# Patient Record
Sex: Female | Born: 1950 | Race: White | Hispanic: No | Smoking: Never smoker
Health system: Southern US, Community
[De-identification: ages and names within clinical notes are randomized; demographics above are authoritative.]

## PROBLEM LIST (undated history)

## (undated) DIAGNOSIS — J869 Pyothorax without fistula: Secondary | ICD-10-CM

## (undated) DIAGNOSIS — J9311 Primary spontaneous pneumothorax: Secondary | ICD-10-CM

## (undated) DIAGNOSIS — I10 Essential (primary) hypertension: Secondary | ICD-10-CM

## (undated) DIAGNOSIS — J449 Chronic obstructive pulmonary disease, unspecified: Secondary | ICD-10-CM

## (undated) HISTORY — PX: APPENDECTOMY: SHX54

---

## 2005-10-10 ENCOUNTER — Ambulatory Visit: Payer: Self-pay | Admitting: Cardiology

## 2005-10-10 ENCOUNTER — Inpatient Hospital Stay (HOSPITAL_COMMUNITY): Admission: EM | Admit: 2005-10-10 | Discharge: 2005-10-15 | Payer: Self-pay | Admitting: *Deleted

## 2005-10-11 ENCOUNTER — Ambulatory Visit: Payer: Self-pay | Admitting: Pulmonary Disease

## 2014-10-12 DIAGNOSIS — I10 Essential (primary) hypertension: Secondary | ICD-10-CM | POA: Diagnosis not present

## 2014-10-12 DIAGNOSIS — Z23 Encounter for immunization: Secondary | ICD-10-CM | POA: Diagnosis not present

## 2014-10-12 DIAGNOSIS — J449 Chronic obstructive pulmonary disease, unspecified: Secondary | ICD-10-CM | POA: Diagnosis not present

## 2014-10-12 DIAGNOSIS — R600 Localized edema: Secondary | ICD-10-CM | POA: Diagnosis not present

## 2014-10-29 DIAGNOSIS — M7732 Calcaneal spur, left foot: Secondary | ICD-10-CM | POA: Diagnosis not present

## 2014-10-29 DIAGNOSIS — R600 Localized edema: Secondary | ICD-10-CM | POA: Diagnosis not present

## 2014-10-29 DIAGNOSIS — M7989 Other specified soft tissue disorders: Secondary | ICD-10-CM | POA: Diagnosis not present

## 2014-10-29 DIAGNOSIS — M25572 Pain in left ankle and joints of left foot: Secondary | ICD-10-CM | POA: Diagnosis not present

## 2014-11-10 DIAGNOSIS — R600 Localized edema: Secondary | ICD-10-CM | POA: Diagnosis not present

## 2014-11-10 DIAGNOSIS — I872 Venous insufficiency (chronic) (peripheral): Secondary | ICD-10-CM | POA: Diagnosis not present

## 2014-12-11 DIAGNOSIS — I83892 Varicose veins of left lower extremities with other complications: Secondary | ICD-10-CM | POA: Diagnosis not present

## 2014-12-11 DIAGNOSIS — I8392 Asymptomatic varicose veins of left lower extremity: Secondary | ICD-10-CM | POA: Diagnosis not present

## 2014-12-11 DIAGNOSIS — I83812 Varicose veins of left lower extremities with pain: Secondary | ICD-10-CM | POA: Diagnosis not present

## 2014-12-11 DIAGNOSIS — J449 Chronic obstructive pulmonary disease, unspecified: Secondary | ICD-10-CM | POA: Diagnosis not present

## 2014-12-18 DIAGNOSIS — I83812 Varicose veins of left lower extremities with pain: Secondary | ICD-10-CM | POA: Diagnosis not present

## 2014-12-18 DIAGNOSIS — Z9889 Other specified postprocedural states: Secondary | ICD-10-CM | POA: Diagnosis not present

## 2014-12-21 DIAGNOSIS — E785 Hyperlipidemia, unspecified: Secondary | ICD-10-CM | POA: Diagnosis not present

## 2014-12-21 DIAGNOSIS — Z9181 History of falling: Secondary | ICD-10-CM | POA: Diagnosis not present

## 2014-12-21 DIAGNOSIS — Z0000 Encounter for general adult medical examination without abnormal findings: Secondary | ICD-10-CM | POA: Diagnosis not present

## 2014-12-21 DIAGNOSIS — Z1212 Encounter for screening for malignant neoplasm of rectum: Secondary | ICD-10-CM | POA: Diagnosis not present

## 2014-12-21 DIAGNOSIS — Z1389 Encounter for screening for other disorder: Secondary | ICD-10-CM | POA: Diagnosis not present

## 2015-01-14 DIAGNOSIS — G5600 Carpal tunnel syndrome, unspecified upper limb: Secondary | ICD-10-CM | POA: Diagnosis not present

## 2015-01-26 DIAGNOSIS — I83812 Varicose veins of left lower extremities with pain: Secondary | ICD-10-CM | POA: Diagnosis not present

## 2015-01-26 DIAGNOSIS — I83892 Varicose veins of left lower extremities with other complications: Secondary | ICD-10-CM | POA: Diagnosis not present

## 2015-02-18 DIAGNOSIS — R0602 Shortness of breath: Secondary | ICD-10-CM | POA: Diagnosis not present

## 2015-02-19 DIAGNOSIS — J441 Chronic obstructive pulmonary disease with (acute) exacerbation: Secondary | ICD-10-CM | POA: Diagnosis not present

## 2015-02-19 DIAGNOSIS — R05 Cough: Secondary | ICD-10-CM | POA: Diagnosis not present

## 2015-02-19 DIAGNOSIS — R7989 Other specified abnormal findings of blood chemistry: Secondary | ICD-10-CM | POA: Diagnosis not present

## 2015-02-19 DIAGNOSIS — R0602 Shortness of breath: Secondary | ICD-10-CM | POA: Diagnosis not present

## 2015-02-19 DIAGNOSIS — J209 Acute bronchitis, unspecified: Secondary | ICD-10-CM | POA: Diagnosis not present

## 2015-02-19 DIAGNOSIS — R918 Other nonspecific abnormal finding of lung field: Secondary | ICD-10-CM | POA: Diagnosis not present

## 2015-02-19 DIAGNOSIS — Z79899 Other long term (current) drug therapy: Secondary | ICD-10-CM | POA: Diagnosis not present

## 2015-02-19 DIAGNOSIS — J9601 Acute respiratory failure with hypoxia: Secondary | ICD-10-CM | POA: Diagnosis not present

## 2015-02-19 DIAGNOSIS — N179 Acute kidney failure, unspecified: Secondary | ICD-10-CM | POA: Diagnosis not present

## 2015-02-19 DIAGNOSIS — R0902 Hypoxemia: Secondary | ICD-10-CM | POA: Diagnosis not present

## 2015-02-19 DIAGNOSIS — J440 Chronic obstructive pulmonary disease with acute lower respiratory infection: Secondary | ICD-10-CM | POA: Diagnosis not present

## 2015-02-19 DIAGNOSIS — R911 Solitary pulmonary nodule: Secondary | ICD-10-CM | POA: Diagnosis not present

## 2015-02-19 DIAGNOSIS — J159 Unspecified bacterial pneumonia: Secondary | ICD-10-CM | POA: Diagnosis not present

## 2015-02-19 DIAGNOSIS — E860 Dehydration: Secondary | ICD-10-CM | POA: Diagnosis not present

## 2015-02-19 DIAGNOSIS — R0600 Dyspnea, unspecified: Secondary | ICD-10-CM | POA: Diagnosis not present

## 2015-02-19 DIAGNOSIS — I340 Nonrheumatic mitral (valve) insufficiency: Secondary | ICD-10-CM | POA: Diagnosis not present

## 2015-02-19 DIAGNOSIS — I10 Essential (primary) hypertension: Secondary | ICD-10-CM | POA: Diagnosis not present

## 2015-03-03 DIAGNOSIS — J159 Unspecified bacterial pneumonia: Secondary | ICD-10-CM | POA: Diagnosis not present

## 2015-03-03 DIAGNOSIS — J9601 Acute respiratory failure with hypoxia: Secondary | ICD-10-CM | POA: Diagnosis not present

## 2015-03-03 DIAGNOSIS — J441 Chronic obstructive pulmonary disease with (acute) exacerbation: Secondary | ICD-10-CM | POA: Diagnosis not present

## 2015-03-03 DIAGNOSIS — R911 Solitary pulmonary nodule: Secondary | ICD-10-CM | POA: Diagnosis not present

## 2015-03-03 DIAGNOSIS — Z09 Encounter for follow-up examination after completed treatment for conditions other than malignant neoplasm: Secondary | ICD-10-CM | POA: Diagnosis not present

## 2015-08-26 DIAGNOSIS — J9690 Respiratory failure, unspecified, unspecified whether with hypoxia or hypercapnia: Secondary | ICD-10-CM | POA: Diagnosis not present

## 2015-08-26 DIAGNOSIS — K219 Gastro-esophageal reflux disease without esophagitis: Secondary | ICD-10-CM | POA: Diagnosis not present

## 2015-08-26 DIAGNOSIS — J440 Chronic obstructive pulmonary disease with acute lower respiratory infection: Secondary | ICD-10-CM | POA: Diagnosis not present

## 2015-08-26 DIAGNOSIS — J449 Chronic obstructive pulmonary disease, unspecified: Secondary | ICD-10-CM | POA: Diagnosis not present

## 2015-08-26 DIAGNOSIS — J180 Bronchopneumonia, unspecified organism: Secondary | ICD-10-CM | POA: Diagnosis not present

## 2015-08-26 DIAGNOSIS — E860 Dehydration: Secondary | ICD-10-CM | POA: Diagnosis not present

## 2015-08-26 DIAGNOSIS — J209 Acute bronchitis, unspecified: Secondary | ICD-10-CM | POA: Diagnosis not present

## 2015-08-26 DIAGNOSIS — J219 Acute bronchiolitis, unspecified: Secondary | ICD-10-CM | POA: Diagnosis not present

## 2015-08-26 DIAGNOSIS — R0902 Hypoxemia: Secondary | ICD-10-CM | POA: Diagnosis not present

## 2015-08-26 DIAGNOSIS — Z79899 Other long term (current) drug therapy: Secondary | ICD-10-CM | POA: Diagnosis not present

## 2015-08-26 DIAGNOSIS — I10 Essential (primary) hypertension: Secondary | ICD-10-CM | POA: Diagnosis not present

## 2015-08-26 DIAGNOSIS — J441 Chronic obstructive pulmonary disease with (acute) exacerbation: Secondary | ICD-10-CM | POA: Diagnosis not present

## 2015-08-26 DIAGNOSIS — Z7982 Long term (current) use of aspirin: Secondary | ICD-10-CM | POA: Diagnosis not present

## 2015-08-26 DIAGNOSIS — Z23 Encounter for immunization: Secondary | ICD-10-CM | POA: Diagnosis not present

## 2015-08-26 DIAGNOSIS — Z7952 Long term (current) use of systemic steroids: Secondary | ICD-10-CM | POA: Diagnosis not present

## 2015-08-26 DIAGNOSIS — E785 Hyperlipidemia, unspecified: Secondary | ICD-10-CM | POA: Diagnosis not present

## 2015-08-26 DIAGNOSIS — R0602 Shortness of breath: Secondary | ICD-10-CM | POA: Diagnosis not present

## 2015-08-26 DIAGNOSIS — R069 Unspecified abnormalities of breathing: Secondary | ICD-10-CM | POA: Diagnosis not present

## 2015-08-26 DIAGNOSIS — J9601 Acute respiratory failure with hypoxia: Secondary | ICD-10-CM | POA: Diagnosis not present

## 2015-08-31 DIAGNOSIS — J219 Acute bronchiolitis, unspecified: Secondary | ICD-10-CM | POA: Diagnosis not present

## 2015-08-31 DIAGNOSIS — J440 Chronic obstructive pulmonary disease with acute lower respiratory infection: Secondary | ICD-10-CM | POA: Diagnosis not present

## 2015-08-31 DIAGNOSIS — I10 Essential (primary) hypertension: Secondary | ICD-10-CM | POA: Diagnosis not present

## 2015-09-21 DIAGNOSIS — J449 Chronic obstructive pulmonary disease, unspecified: Secondary | ICD-10-CM | POA: Diagnosis not present

## 2015-09-21 DIAGNOSIS — Z09 Encounter for follow-up examination after completed treatment for conditions other than malignant neoplasm: Secondary | ICD-10-CM | POA: Diagnosis not present

## 2015-09-21 DIAGNOSIS — I10 Essential (primary) hypertension: Secondary | ICD-10-CM | POA: Diagnosis not present

## 2015-10-01 DIAGNOSIS — E785 Hyperlipidemia, unspecified: Secondary | ICD-10-CM | POA: Diagnosis not present

## 2015-10-01 DIAGNOSIS — K219 Gastro-esophageal reflux disease without esophagitis: Secondary | ICD-10-CM | POA: Diagnosis not present

## 2015-10-01 DIAGNOSIS — J180 Bronchopneumonia, unspecified organism: Secondary | ICD-10-CM | POA: Diagnosis not present

## 2015-10-01 DIAGNOSIS — J449 Chronic obstructive pulmonary disease, unspecified: Secondary | ICD-10-CM | POA: Diagnosis not present

## 2015-10-27 ENCOUNTER — Institutional Professional Consult (permissible substitution): Payer: Self-pay | Admitting: Pulmonary Disease

## 2015-10-29 DIAGNOSIS — J180 Bronchopneumonia, unspecified organism: Secondary | ICD-10-CM | POA: Diagnosis not present

## 2015-10-29 DIAGNOSIS — J449 Chronic obstructive pulmonary disease, unspecified: Secondary | ICD-10-CM | POA: Diagnosis not present

## 2015-10-29 DIAGNOSIS — E785 Hyperlipidemia, unspecified: Secondary | ICD-10-CM | POA: Diagnosis not present

## 2015-10-29 DIAGNOSIS — K219 Gastro-esophageal reflux disease without esophagitis: Secondary | ICD-10-CM | POA: Diagnosis not present

## 2015-11-15 ENCOUNTER — Other Ambulatory Visit: Payer: Self-pay

## 2015-11-23 ENCOUNTER — Other Ambulatory Visit: Payer: Self-pay

## 2015-11-23 ENCOUNTER — Encounter: Payer: Self-pay | Admitting: Pulmonary Disease

## 2015-11-23 ENCOUNTER — Ambulatory Visit (INDEPENDENT_AMBULATORY_CARE_PROVIDER_SITE_OTHER): Payer: Medicare Other | Admitting: Pulmonary Disease

## 2015-11-23 ENCOUNTER — Ambulatory Visit (INDEPENDENT_AMBULATORY_CARE_PROVIDER_SITE_OTHER)
Admission: RE | Admit: 2015-11-23 | Discharge: 2015-11-23 | Disposition: A | Discharge status: Home / Self Care | Payer: Medicare Other | Source: Ambulatory Visit | Attending: Pulmonary Disease | Admitting: Pulmonary Disease

## 2015-11-23 VITALS — BP 160/80 | HR 63 | Ht 64.0 in | Wt 153.0 lb

## 2015-11-23 DIAGNOSIS — J449 Chronic obstructive pulmonary disease, unspecified: Secondary | ICD-10-CM

## 2015-11-23 DIAGNOSIS — R0602 Shortness of breath: Secondary | ICD-10-CM | POA: Diagnosis not present

## 2015-11-23 NOTE — Progress Notes (Signed)
   Subjective:    Patient ID: Diana Gardner, female    DOB: 1951/08/10, 65 y.o.   MRN: [id number]  HPI Consult for evaluation of recurrent bronchitis, COPD  Diana Gardner is a 65 year old with past medical history of COPD, bronchitis. She she has been told she has COPD but she's never had lung function tests or seen a pulmonologist in the past. She has history of recurrent bronchitis, pneumonias. She is hospitalized at least once every year with this. She was at [facility] in January 2017 with acute exacerbation of COPD, bronchitis. She does not recall if she was tested for the flu at that time.  In office today she feels well. She has mild dyspnea on exertion associated with wheeze. She has chronic daily cough with no sputum production. No fevers, chills. She has been on Combivent for the past 5 months. She states that this helps with her symptoms.  Social History:  She is a lifelong nonsmoker. She had been exposed to secondhand smoke at home. No alcohol, drug use.  Family History: Mother-emphysema Father-heart disease.  PMH Hypertension  ,\ Current outpatient prescriptions:  .  ALPRAZolam (XANAX) 0.25 MG tablet, Take 1-2 tablets by mouth daily as needed., Disp: , Rfl:  .  atorvastatin (LIPITOR) 20 MG tablet, Take 20 mg by mouth daily., Disp: , Rfl:  .  COMBIVENT RESPIMAT 20-100 MCG/ACT AERS respimat, Inhale 1 puff into the lungs 4 (four) times daily., Disp: , Rfl:  .  ipratropium-albuterol (DUONEB) 0.5-2.5 (3) MG/3ML SOLN, Take 3 mLs by nebulization., Disp: , Rfl:  .  lisinopril-hydrochlorothiazide (PRINZIDE,ZESTORETIC) 20-25 MG tablet, Take 1 tablet by mouth daily., Disp: , Rfl:  .  metoprolol (LOPRESSOR) 100 MG tablet, Take 1 tablet by mouth 2 (two) times daily., Disp: , Rfl:   Review of Systems Dyspnea on exertion with wheezing, nonproductive cough. No sputum production, fevers, chills, hemoptysis. No chest pain, palpitation. No nausea, vomiting, diarrhea,  constipation. All other review of systems are negative.    Objective:   Physical Exam Blood pressure 160/80, pulse 63, height 5\' 4"  (1.626 m), weight 153 lb (69.4 kg), SpO2 95 %. Gen: No apparent distress Neuro: No gross focal deficits. HEENT: No JVD, lymphadenopathy, thyromegaly. RS: Clear, No wheeze or crackles CVS: S1-S2 heard, no murmurs rubs gallops. Abdomen: Soft, positive bowel sounds. Musculoskeletal: No edema.    Assessment & Plan:  Evaluation for COPD, recurrent bronchitis.  Review of her CT scan and lung imaging shows hyperinflation consistent with emphysema although she is a never smoker. There is family history of emphysema on her mother's side who was a smoker. Her CT imaging over the years at [place] was reviewed. They show bronchitis, bronchiolitis but no clear evidence of bronchiectasis.   She is doing well on the Combivent and will continue the same. I will evaluate again in PFTs, x-ray today and blood tests for alpha-1 antitrypsin, quantitative immunoglobulins, cf panel.  Plan: - Continue using the conbivent, duonebs - Get PFTs, blood work for A1AT, quant immunoglobulins, CF panel. - CXR  Return in 1 month.  Marshell Garfinkel MD [facility] Pager [phone] If no answer or after 3pm call: [phone] 11/23/2015, 12:36 PM

## 2015-11-23 NOTE — Patient Instructions (Addendum)
We will schedule you for lung function tests Blood tests to be done today.  Continue using the combivent inhalers. Return to clinic in 1 month.

## 2015-11-24 LAB — IGG, IGA, IGM
IGA: 278 mg/dL (ref 69–380)
IGM, SERUM: 165 mg/dL (ref 52–322)
IgG (Immunoglobin G), Serum: 1660 mg/dL (ref 690–1700)

## 2015-11-25 LAB — IGE: IGE (IMMUNOGLOBULIN E), SERUM: 145 kU/L — AB (ref <115)

## 2015-11-26 NOTE — Progress Notes (Signed)
Quick Note:  ATC pt x2 at number on file, operator stated number cannot be completed as dialed. Will try again later. ______

## 2015-11-29 DIAGNOSIS — J180 Bronchopneumonia, unspecified organism: Secondary | ICD-10-CM | POA: Diagnosis not present

## 2015-11-29 DIAGNOSIS — K219 Gastro-esophageal reflux disease without esophagitis: Secondary | ICD-10-CM | POA: Diagnosis not present

## 2015-11-29 DIAGNOSIS — J449 Chronic obstructive pulmonary disease, unspecified: Secondary | ICD-10-CM | POA: Diagnosis not present

## 2015-11-29 DIAGNOSIS — E785 Hyperlipidemia, unspecified: Secondary | ICD-10-CM | POA: Diagnosis not present

## 2015-11-29 LAB — ALPHA-1 ANTITRYPSIN PHENOTYPE: A-1 Antitrypsin: 150 mg/dL (ref 83–199)

## 2015-11-30 LAB — CYSTIC FIBROSIS DIAGNOSTIC STUDY

## 2015-12-01 NOTE — Progress Notes (Signed)
Quick Note:  ATC pt x2, the number we have on file for her is not a working number [employer] on file for pt's daughter and son, have LMOM TCB x1 for pt's daughter Diana Gardner. ______

## 2015-12-01 NOTE — Progress Notes (Signed)
Quick Note:  Pt's daughter Magda Paganini returned call. Advised of cxr results as stated by PM. Magda Paganini voiced her understanding and pt's contact number corrected in chart. ______

## 2015-12-07 DIAGNOSIS — F4322 Adjustment disorder with anxiety: Secondary | ICD-10-CM | POA: Diagnosis not present

## 2015-12-07 DIAGNOSIS — J449 Chronic obstructive pulmonary disease, unspecified: Secondary | ICD-10-CM | POA: Diagnosis not present

## 2015-12-07 DIAGNOSIS — R001 Bradycardia, unspecified: Secondary | ICD-10-CM | POA: Diagnosis not present

## 2015-12-07 DIAGNOSIS — Z6826 Body mass index (BMI) 26.0-26.9, adult: Secondary | ICD-10-CM | POA: Diagnosis not present

## 2015-12-29 DIAGNOSIS — E785 Hyperlipidemia, unspecified: Secondary | ICD-10-CM | POA: Diagnosis not present

## 2015-12-29 DIAGNOSIS — J449 Chronic obstructive pulmonary disease, unspecified: Secondary | ICD-10-CM | POA: Diagnosis not present

## 2015-12-29 DIAGNOSIS — J180 Bronchopneumonia, unspecified organism: Secondary | ICD-10-CM | POA: Diagnosis not present

## 2015-12-29 DIAGNOSIS — K219 Gastro-esophageal reflux disease without esophagitis: Secondary | ICD-10-CM | POA: Diagnosis not present

## 2016-01-29 DIAGNOSIS — J449 Chronic obstructive pulmonary disease, unspecified: Secondary | ICD-10-CM | POA: Diagnosis not present

## 2016-01-29 DIAGNOSIS — J180 Bronchopneumonia, unspecified organism: Secondary | ICD-10-CM | POA: Diagnosis not present

## 2016-01-29 DIAGNOSIS — E785 Hyperlipidemia, unspecified: Secondary | ICD-10-CM | POA: Diagnosis not present

## 2016-01-29 DIAGNOSIS — K219 Gastro-esophageal reflux disease without esophagitis: Secondary | ICD-10-CM | POA: Diagnosis not present

## 2016-01-31 ENCOUNTER — Ambulatory Visit (INDEPENDENT_AMBULATORY_CARE_PROVIDER_SITE_OTHER): Payer: Medicare Other | Admitting: Pulmonary Disease

## 2016-01-31 ENCOUNTER — Encounter: Payer: Self-pay | Admitting: Pulmonary Disease

## 2016-01-31 ENCOUNTER — Encounter (INDEPENDENT_AMBULATORY_CARE_PROVIDER_SITE_OTHER): Payer: Self-pay

## 2016-01-31 VITALS — BP 124/72 | HR 55 | Ht 62.0 in | Wt 151.0 lb

## 2016-01-31 DIAGNOSIS — J441 Chronic obstructive pulmonary disease with (acute) exacerbation: Secondary | ICD-10-CM | POA: Insufficient documentation

## 2016-01-31 DIAGNOSIS — J449 Chronic obstructive pulmonary disease, unspecified: Secondary | ICD-10-CM

## 2016-01-31 LAB — PULMONARY FUNCTION TEST
DL/VA % PRED: 152 %
DL/VA: 6.9 ml/min/mmHg/L
DLCO COR % PRED: 75 %
DLCO COR: 16.2 ml/min/mmHg
DLCO unc % pred: 68 %
DLCO unc: 14.74 ml/min/mmHg
FEF 25-75 POST: 0.56 L/s
FEF 25-75 Pre: 0.29 L/sec
FEF2575-%CHANGE-POST: 95 %
FEF2575-%PRED-POST: 29 %
FEF2575-%PRED-PRE: 14 %
FEV1-%CHANGE-POST: 22 %
FEV1-%Pred-Post: 36 %
FEV1-%Pred-Pre: 29 %
FEV1-POST: 0.79 L
FEV1-Pre: 0.64 L
FEV1FVC-%CHANGE-POST: 2 %
FEV1FVC-%PRED-PRE: 76 %
FEV6-%Change-Post: 19 %
FEV6-%Pred-Post: 47 %
FEV6-%Pred-Pre: 39 %
FEV6-POST: 1.3 L
FEV6-Pre: 1.09 L
FEV6FVC-%Change-Post: 0 %
FEV6FVC-%PRED-POST: 104 %
FEV6FVC-%Pred-Pre: 103 %
FVC-%CHANGE-POST: 19 %
FVC-%PRED-POST: 45 %
FVC-%PRED-PRE: 38 %
FVC-POST: 1.3 L
FVC-PRE: 1.09 L
PRE FEV1/FVC RATIO: 59 %
PRE FEV6/FVC RATIO: 100 %
Post FEV1/FVC ratio: 60 %
Post FEV6/FVC ratio: 100 %
RV % pred: 144 %
RV: 2.91 L
TLC % pred: 86 %
TLC: 4.13 L

## 2016-01-31 NOTE — Progress Notes (Signed)
   Subjective:    Patient ID: Diana Gardner, female    DOB: 23-Apr-1951, 65 y.o.   MRN: [id number]  PROBLEM LIST Severe COPD Recurrent bronchitis  HPI Diana Gardner is a 65 year old with past medical history of COPD, bronchitis. She has history of recurrent bronchitis, pneumonias. She is hospitalized at least once every year with this. She was at [facility] in January 2017 with acute exacerbation of COPD, bronchitis. She does not recall if she was tested for the flu at that time.  In office today she feels well. She has mild dyspnea on exertion associated with wheeze. She has chronic daily cough with no sputum production. No fevers, chills. She has been on Combivent for the past 5 months. She states that this helps with her symptoms.  DATA: PFTs 01/31/16 FVC 1.09 (38%] FEV1 0.64 (29%) F/59 TLC 86% RV/TLC 167% DLCO 68%  Imaging CXR 11/23/15 COPD changes with lingular scarring  Labs A1AT 11/23/15- 150 (normal) PIMM phenotype CT panel 11/23/15- Negative for mutations tested Immunoglobulins 11/23/15 - Normal IgG, IgA, IgM  Social History:  She is a lifelong nonsmoker. She had been exposed to secondhand smoke at home. No alcohol, drug use.  Family History: Mother-emphysema Father-heart disease.  PMH Hypertension   Current outpatient prescriptions:  .  albuterol (PROVENTIL) (2.5 MG/3ML) 0.083% nebulizer solution, 1 neb three times daily as needed, Disp: , Rfl:  .  ALPRAZolam (XANAX) 0.25 MG tablet, Take 1-2 tablets by mouth daily as needed., Disp: , Rfl:  .  atorvastatin (LIPITOR) 20 MG tablet, Take 20 mg by mouth daily., Disp: , Rfl:  .  COMBIVENT RESPIMAT 20-100 MCG/ACT AERS respimat, Inhale 1 puff into the lungs 4 (four) times daily., Disp: , Rfl:  .  lisinopril-hydrochlorothiazide (PRINZIDE,ZESTORETIC) 20-25 MG tablet, Take 1 tablet by mouth daily., Disp: , Rfl:  .  metoprolol (LOPRESSOR) 100 MG tablet, Take 1 tablet by mouth 2 (two) times daily., Disp: , Rfl:   Review  of Systems Dyspnea on exertion with wheezing, nonproductive cough. No sputum production, fevers, chills, hemoptysis. No chest pain, palpitation. No nausea, vomiting, diarrhea, constipation. All other review of systems are negative.    Objective:   Physical Exam Blood pressure 124/72, pulse 55, height 5\' 2"  (1.575 m), weight 151 lb (68.493 kg), SpO2 96 %. Gen: No apparent distress Neuro: No gross focal deficits. HEENT: No JVD, lymphadenopathy, thyromegaly. RS: Clear, No wheeze or crackles CVS: S1-S2 heard, no murmurs rubs gallops. Abdomen: Soft, positive bowel sounds. Musculoskeletal: No edema.    Assessment & Plan:  Very severe COPD, recurrent bronchitis.  Her pulmonary function tests were reviewed with her today. They show severe obstructive disease which is surprising as she is not an active smoker but only has secondhand smoke exposure. Her alpha-1 antitrypsin, CF panel, immunoglobulins were normal. There is family history of emphysema on her mother's side who was a smoker.    ` She did show some responsiveness to bronchodilators on PFTs. As she is doing well on the Combivent we will continue the same. If there is any deterioration in function then we can consider alternate inhalers.   Plan: - Continue using the conbivent, duonebs  Return in 6 months  Marshell Garfinkel MD [facility] Pager [phone] If no answer or after 3pm call: [phone] 01/31/2016, 4:28 PM

## 2016-01-31 NOTE — Progress Notes (Signed)
PFT done today. 

## 2016-01-31 NOTE — Patient Instructions (Signed)
Continue using the Combivent and albuterol as prescribed. Your lung function tests and lab tests were reviewed with you today.  Return to clinic in 6 months. Please call sooner if there is any worsening of symptoms.

## 2016-02-28 DIAGNOSIS — J449 Chronic obstructive pulmonary disease, unspecified: Secondary | ICD-10-CM | POA: Diagnosis not present

## 2016-02-28 DIAGNOSIS — E785 Hyperlipidemia, unspecified: Secondary | ICD-10-CM | POA: Diagnosis not present

## 2016-02-28 DIAGNOSIS — J180 Bronchopneumonia, unspecified organism: Secondary | ICD-10-CM | POA: Diagnosis not present

## 2016-02-28 DIAGNOSIS — K219 Gastro-esophageal reflux disease without esophagitis: Secondary | ICD-10-CM | POA: Diagnosis not present

## 2016-03-27 DIAGNOSIS — F419 Anxiety disorder, unspecified: Secondary | ICD-10-CM | POA: Diagnosis not present

## 2016-03-27 DIAGNOSIS — E663 Overweight: Secondary | ICD-10-CM | POA: Diagnosis not present

## 2016-03-27 DIAGNOSIS — Z6827 Body mass index (BMI) 27.0-27.9, adult: Secondary | ICD-10-CM | POA: Diagnosis not present

## 2016-03-30 DIAGNOSIS — J449 Chronic obstructive pulmonary disease, unspecified: Secondary | ICD-10-CM | POA: Diagnosis not present

## 2016-03-30 DIAGNOSIS — K219 Gastro-esophageal reflux disease without esophagitis: Secondary | ICD-10-CM | POA: Diagnosis not present

## 2016-03-30 DIAGNOSIS — J180 Bronchopneumonia, unspecified organism: Secondary | ICD-10-CM | POA: Diagnosis not present

## 2016-03-30 DIAGNOSIS — E785 Hyperlipidemia, unspecified: Secondary | ICD-10-CM | POA: Diagnosis not present

## 2016-04-30 DIAGNOSIS — E785 Hyperlipidemia, unspecified: Secondary | ICD-10-CM | POA: Diagnosis not present

## 2016-04-30 DIAGNOSIS — J180 Bronchopneumonia, unspecified organism: Secondary | ICD-10-CM | POA: Diagnosis not present

## 2016-04-30 DIAGNOSIS — J449 Chronic obstructive pulmonary disease, unspecified: Secondary | ICD-10-CM | POA: Diagnosis not present

## 2016-04-30 DIAGNOSIS — K219 Gastro-esophageal reflux disease without esophagitis: Secondary | ICD-10-CM | POA: Diagnosis not present

## 2016-05-30 DIAGNOSIS — K219 Gastro-esophageal reflux disease without esophagitis: Secondary | ICD-10-CM | POA: Diagnosis not present

## 2016-05-30 DIAGNOSIS — J449 Chronic obstructive pulmonary disease, unspecified: Secondary | ICD-10-CM | POA: Diagnosis not present

## 2016-05-30 DIAGNOSIS — J180 Bronchopneumonia, unspecified organism: Secondary | ICD-10-CM | POA: Diagnosis not present

## 2016-05-30 DIAGNOSIS — E785 Hyperlipidemia, unspecified: Secondary | ICD-10-CM | POA: Diagnosis not present

## 2016-07-28 ENCOUNTER — Other Ambulatory Visit: Payer: Self-pay | Admitting: Pharmacist

## 2016-07-28 NOTE — Patient Outreach (Signed)
Outreach call to Diana Gardner regarding her request for follow up from the [facility] Medication Adherence Campaign. Unable to reach patient and patient's voicemail box is full.  Harlow Asa, PharmD, [facility] [phone]

## 2016-11-05 ENCOUNTER — Emergency Department (HOSPITAL_COMMUNITY): Payer: Medicare Other

## 2016-11-05 ENCOUNTER — Encounter (HOSPITAL_COMMUNITY): Payer: Self-pay

## 2016-11-05 ENCOUNTER — Inpatient Hospital Stay (HOSPITAL_COMMUNITY)
Admission: EM | Admit: 2016-11-05 | Discharge: 2016-12-04 | DRG: 870 | Disposition: A | Discharge status: Skilled Nursing Facility | Payer: Medicare Other | Attending: Internal Medicine | Admitting: Internal Medicine

## 2016-11-05 DIAGNOSIS — Z9689 Presence of other specified functional implants: Secondary | ICD-10-CM | POA: Diagnosis present

## 2016-11-05 DIAGNOSIS — E874 Mixed disorder of acid-base balance: Secondary | ICD-10-CM | POA: Diagnosis not present

## 2016-11-05 DIAGNOSIS — J9622 Acute and chronic respiratory failure with hypercapnia: Secondary | ICD-10-CM | POA: Diagnosis not present

## 2016-11-05 DIAGNOSIS — R000 Tachycardia, unspecified: Secondary | ICD-10-CM | POA: Diagnosis present

## 2016-11-05 DIAGNOSIS — N179 Acute kidney failure, unspecified: Secondary | ICD-10-CM | POA: Diagnosis present

## 2016-11-05 DIAGNOSIS — Z4659 Encounter for fitting and adjustment of other gastrointestinal appliance and device: Secondary | ICD-10-CM

## 2016-11-05 DIAGNOSIS — R0902 Hypoxemia: Secondary | ICD-10-CM | POA: Diagnosis not present

## 2016-11-05 DIAGNOSIS — D649 Anemia, unspecified: Secondary | ICD-10-CM | POA: Diagnosis present

## 2016-11-05 DIAGNOSIS — E871 Hypo-osmolality and hyponatremia: Secondary | ICD-10-CM | POA: Diagnosis present

## 2016-11-05 DIAGNOSIS — R6521 Severe sepsis with septic shock: Secondary | ICD-10-CM | POA: Diagnosis present

## 2016-11-05 DIAGNOSIS — T380X5A Adverse effect of glucocorticoids and synthetic analogues, initial encounter: Secondary | ICD-10-CM | POA: Diagnosis not present

## 2016-11-05 DIAGNOSIS — E861 Hypovolemia: Secondary | ICD-10-CM | POA: Diagnosis present

## 2016-11-05 DIAGNOSIS — R739 Hyperglycemia, unspecified: Secondary | ICD-10-CM | POA: Diagnosis not present

## 2016-11-05 DIAGNOSIS — J9690 Respiratory failure, unspecified, unspecified whether with hypoxia or hypercapnia: Secondary | ICD-10-CM

## 2016-11-05 DIAGNOSIS — Z23 Encounter for immunization: Secondary | ICD-10-CM | POA: Diagnosis present

## 2016-11-05 DIAGNOSIS — J181 Lobar pneumonia, unspecified organism: Secondary | ICD-10-CM | POA: Diagnosis not present

## 2016-11-05 DIAGNOSIS — N170 Acute kidney failure with tubular necrosis: Secondary | ICD-10-CM | POA: Diagnosis not present

## 2016-11-05 DIAGNOSIS — J90 Pleural effusion, not elsewhere classified: Secondary | ICD-10-CM

## 2016-11-05 DIAGNOSIS — Z01818 Encounter for other preprocedural examination: Secondary | ICD-10-CM

## 2016-11-05 DIAGNOSIS — R05 Cough: Secondary | ICD-10-CM | POA: Diagnosis present

## 2016-11-05 DIAGNOSIS — J189 Pneumonia, unspecified organism: Secondary | ICD-10-CM | POA: Diagnosis not present

## 2016-11-05 DIAGNOSIS — Z09 Encounter for follow-up examination after completed treatment for conditions other than malignant neoplasm: Secondary | ICD-10-CM

## 2016-11-05 DIAGNOSIS — E8770 Fluid overload, unspecified: Secondary | ICD-10-CM | POA: Diagnosis not present

## 2016-11-05 DIAGNOSIS — D509 Iron deficiency anemia, unspecified: Secondary | ICD-10-CM | POA: Diagnosis present

## 2016-11-05 DIAGNOSIS — J13 Pneumonia due to Streptococcus pneumoniae: Secondary | ICD-10-CM | POA: Diagnosis not present

## 2016-11-05 DIAGNOSIS — A419 Sepsis, unspecified organism: Principal | ICD-10-CM | POA: Diagnosis present

## 2016-11-05 DIAGNOSIS — J440 Chronic obstructive pulmonary disease with acute lower respiratory infection: Secondary | ICD-10-CM | POA: Diagnosis present

## 2016-11-05 DIAGNOSIS — J918 Pleural effusion in other conditions classified elsewhere: Secondary | ICD-10-CM | POA: Diagnosis present

## 2016-11-05 DIAGNOSIS — Z7722 Contact with and (suspected) exposure to environmental tobacco smoke (acute) (chronic): Secondary | ICD-10-CM | POA: Diagnosis present

## 2016-11-05 DIAGNOSIS — Z978 Presence of other specified devices: Secondary | ICD-10-CM

## 2016-11-05 DIAGNOSIS — J441 Chronic obstructive pulmonary disease with (acute) exacerbation: Secondary | ICD-10-CM | POA: Diagnosis present

## 2016-11-05 DIAGNOSIS — J939 Pneumothorax, unspecified: Secondary | ICD-10-CM

## 2016-11-05 DIAGNOSIS — Z902 Acquired absence of lung [part of]: Secondary | ICD-10-CM | POA: Diagnosis not present

## 2016-11-05 DIAGNOSIS — E860 Dehydration: Secondary | ICD-10-CM | POA: Diagnosis present

## 2016-11-05 DIAGNOSIS — G9341 Metabolic encephalopathy: Secondary | ICD-10-CM | POA: Diagnosis not present

## 2016-11-05 DIAGNOSIS — Z9981 Dependence on supplemental oxygen: Secondary | ICD-10-CM

## 2016-11-05 DIAGNOSIS — J9601 Acute respiratory failure with hypoxia: Secondary | ICD-10-CM | POA: Diagnosis not present

## 2016-11-05 DIAGNOSIS — J9383 Other pneumothorax: Secondary | ICD-10-CM | POA: Diagnosis present

## 2016-11-05 DIAGNOSIS — J9311 Primary spontaneous pneumothorax: Secondary | ICD-10-CM | POA: Diagnosis not present

## 2016-11-05 DIAGNOSIS — J869 Pyothorax without fistula: Secondary | ICD-10-CM | POA: Diagnosis present

## 2016-11-05 DIAGNOSIS — E875 Hyperkalemia: Secondary | ICD-10-CM | POA: Diagnosis present

## 2016-11-05 DIAGNOSIS — E870 Hyperosmolality and hypernatremia: Secondary | ICD-10-CM | POA: Diagnosis not present

## 2016-11-05 DIAGNOSIS — R0602 Shortness of breath: Secondary | ICD-10-CM

## 2016-11-05 DIAGNOSIS — Z4682 Encounter for fitting and adjustment of non-vascular catheter: Secondary | ICD-10-CM

## 2016-11-05 DIAGNOSIS — D473 Essential (hemorrhagic) thrombocythemia: Secondary | ICD-10-CM | POA: Diagnosis not present

## 2016-11-05 DIAGNOSIS — I959 Hypotension, unspecified: Secondary | ICD-10-CM | POA: Diagnosis not present

## 2016-11-05 DIAGNOSIS — J9600 Acute respiratory failure, unspecified whether with hypoxia or hypercapnia: Secondary | ICD-10-CM

## 2016-11-05 DIAGNOSIS — J690 Pneumonitis due to inhalation of food and vomit: Secondary | ICD-10-CM | POA: Diagnosis present

## 2016-11-05 DIAGNOSIS — A403 Sepsis due to Streptococcus pneumoniae: Secondary | ICD-10-CM | POA: Diagnosis not present

## 2016-11-05 DIAGNOSIS — R0600 Dyspnea, unspecified: Secondary | ICD-10-CM | POA: Diagnosis not present

## 2016-11-05 DIAGNOSIS — J180 Bronchopneumonia, unspecified organism: Secondary | ICD-10-CM | POA: Diagnosis not present

## 2016-11-05 DIAGNOSIS — J9621 Acute and chronic respiratory failure with hypoxia: Secondary | ICD-10-CM | POA: Diagnosis not present

## 2016-11-05 DIAGNOSIS — J850 Gangrene and necrosis of lung: Secondary | ICD-10-CM | POA: Diagnosis present

## 2016-11-05 DIAGNOSIS — E872 Acidosis: Secondary | ICD-10-CM | POA: Diagnosis not present

## 2016-11-05 DIAGNOSIS — I10 Essential (primary) hypertension: Secondary | ICD-10-CM | POA: Diagnosis present

## 2016-11-05 DIAGNOSIS — J449 Chronic obstructive pulmonary disease, unspecified: Secondary | ICD-10-CM | POA: Diagnosis not present

## 2016-11-05 DIAGNOSIS — J9811 Atelectasis: Secondary | ICD-10-CM

## 2016-11-05 DIAGNOSIS — E876 Hypokalemia: Secondary | ICD-10-CM | POA: Diagnosis present

## 2016-11-05 DIAGNOSIS — Z825 Family history of asthma and other chronic lower respiratory diseases: Secondary | ICD-10-CM

## 2016-11-05 DIAGNOSIS — Z8249 Family history of ischemic heart disease and other diseases of the circulatory system: Secondary | ICD-10-CM

## 2016-11-05 DIAGNOSIS — Z452 Encounter for adjustment and management of vascular access device: Secondary | ICD-10-CM

## 2016-11-05 DIAGNOSIS — Z79899 Other long term (current) drug therapy: Secondary | ICD-10-CM

## 2016-11-05 DIAGNOSIS — D75839 Thrombocytosis, unspecified: Secondary | ICD-10-CM

## 2016-11-05 HISTORY — DX: Primary spontaneous pneumothorax: J93.11

## 2016-11-05 HISTORY — DX: Chronic obstructive pulmonary disease, unspecified: J44.9

## 2016-11-05 HISTORY — DX: Essential (primary) hypertension: I10

## 2016-11-05 HISTORY — DX: Pyothorax without fistula: J86.9

## 2016-11-05 LAB — CBC WITH DIFFERENTIAL/PLATELET
BASOS PCT: 0 %
Basophils Absolute: 0 10*3/uL (ref 0.0–0.1)
EOS PCT: 0 %
Eosinophils Absolute: 0 10*3/uL (ref 0.0–0.7)
HCT: 32.8 % — ABNORMAL LOW (ref 36.0–46.0)
HEMOGLOBIN: 11.6 g/dL — AB (ref 12.0–15.0)
LYMPHS PCT: 3 %
Lymphs Abs: 0.7 10*3/uL (ref 0.7–4.0)
MCH: 28.1 pg (ref 26.0–34.0)
MCHC: 35.4 g/dL (ref 30.0–36.0)
MCV: 79.4 fL (ref 78.0–100.0)
MONOS PCT: 3 %
Monocytes Absolute: 0.7 10*3/uL (ref 0.1–1.0)
NEUTROS PCT: 94 %
Neutro Abs: 23.1 10*3/uL — ABNORMAL HIGH (ref 1.7–7.7)
Platelets: 404 10*3/uL — ABNORMAL HIGH (ref 150–400)
RBC: 4.13 MIL/uL (ref 3.87–5.11)
RDW: 15.3 % (ref 11.5–15.5)
WBC: 24.5 10*3/uL — ABNORMAL HIGH (ref 4.0–10.5)

## 2016-11-05 LAB — I-STAT CG4 LACTIC ACID, ED: Lactic Acid, Venous: 2.13 mmol/L (ref 0.5–1.9)

## 2016-11-05 LAB — BASIC METABOLIC PANEL
ANION GAP: 14 (ref 5–15)
BUN: 120 mg/dL — ABNORMAL HIGH (ref 6–20)
CALCIUM: 9.5 mg/dL (ref 8.9–10.3)
CO2: 24 mmol/L (ref 22–32)
CREATININE: 3.7 mg/dL — AB (ref 0.44–1.00)
Chloride: 93 mmol/L — ABNORMAL LOW (ref 101–111)
GFR, EST AFRICAN AMERICAN: 14 mL/min — AB (ref >60)
GFR, EST NON AFRICAN AMERICAN: 12 mL/min — AB (ref >60)
Glucose, Bld: 146 mg/dL — ABNORMAL HIGH (ref 65–99)
Potassium: 5.3 mmol/L — ABNORMAL HIGH (ref 3.5–5.1)
Sodium: 131 mmol/L — ABNORMAL LOW (ref 135–145)

## 2016-11-05 LAB — I-STAT ARTERIAL BLOOD GAS, ED
Bicarbonate: 25.4 mmol/L (ref 20.0–28.0)
O2 Saturation: 90 %
PH ART: 7.404 (ref 7.350–7.450)
TCO2: 27 mmol/L (ref 0–100)
pCO2 arterial: 40.4 mmHg (ref 32.0–48.0)
pO2, Arterial: 57 mmHg — ABNORMAL LOW (ref 83.0–108.0)

## 2016-11-05 LAB — I-STAT TROPONIN, ED: TROPONIN I, POC: 0 ng/mL (ref 0.00–0.08)

## 2016-11-05 MED ORDER — DEXTROSE 5 % IV SOLN
1.0000 g | Freq: Once | INTRAVENOUS | Status: AC
  Administered 2016-11-05: 1 g via INTRAVENOUS
  Filled 2016-11-05: qty 10

## 2016-11-05 MED ORDER — ALBUTEROL SULFATE (2.5 MG/3ML) 0.083% IN NEBU
2.5000 mg | INHALATION_SOLUTION | RESPIRATORY_TRACT | Status: DC | PRN
  Administered 2016-11-07 – 2016-11-22 (×3): 2.5 mg via RESPIRATORY_TRACT
  Filled 2016-11-05 (×3): qty 3

## 2016-11-05 MED ORDER — ALBUTEROL SULFATE (2.5 MG/3ML) 0.083% IN NEBU: 2.5000 mg | INHALATION_SOLUTION | Freq: Four times a day (QID) | RESPIRATORY_TRACT | Status: DC

## 2016-11-05 MED ORDER — VANCOMYCIN HCL IN DEXTROSE 1-5 GM/200ML-% IV SOLN
1000.0000 mg | Freq: Once | INTRAVENOUS | Status: AC
  Administered 2016-11-05: 1000 mg via INTRAVENOUS
  Filled 2016-11-05: qty 200

## 2016-11-05 MED ORDER — INFLUENZA VAC SPLIT QUAD 0.5 ML IM SUSY
0.5000 mL | PREFILLED_SYRINGE | INTRAMUSCULAR | Status: AC
  Administered 2016-11-06: 0.5 mL via INTRAMUSCULAR
  Filled 2016-11-05: qty 0.5

## 2016-11-05 MED ORDER — VANCOMYCIN HCL IN DEXTROSE 1-5 GM/200ML-% IV SOLN: 1000.0000 mg | INTRAVENOUS | Status: DC

## 2016-11-05 MED ORDER — SODIUM CHLORIDE 0.9% FLUSH
3.0000 mL | Freq: Two times a day (BID) | INTRAVENOUS | Status: DC
  Administered 2016-11-06 – 2016-11-14 (×12): 3 mL via INTRAVENOUS

## 2016-11-05 MED ORDER — CEFTRIAXONE SODIUM 1 G IJ SOLR
1.0000 g | INTRAMUSCULAR | Status: DC
  Administered 2016-11-06 – 2016-11-07 (×2): 1 g via INTRAVENOUS
  Filled 2016-11-05 (×3): qty 10

## 2016-11-05 MED ORDER — SODIUM CHLORIDE 0.9 % IV SOLN
INTRAVENOUS | Status: DC
  Administered 2016-11-06: 01:00:00 via INTRAVENOUS

## 2016-11-05 MED ORDER — IPRATROPIUM-ALBUTEROL 0.5-2.5 (3) MG/3ML IN SOLN
3.0000 mL | Freq: Four times a day (QID) | RESPIRATORY_TRACT | Status: DC
  Administered 2016-11-06 – 2016-11-25 (×73): 3 mL via RESPIRATORY_TRACT
  Filled 2016-11-05 (×74): qty 3

## 2016-11-05 MED ORDER — ONDANSETRON HCL 4 MG PO TABS: 4.0000 mg | ORAL_TABLET | Freq: Four times a day (QID) | ORAL | Status: DC | PRN

## 2016-11-05 MED ORDER — DEXTROSE 5 % IV SOLN
500.0000 mg | Freq: Once | INTRAVENOUS | Status: AC
  Administered 2016-11-05: 500 mg via INTRAVENOUS
  Filled 2016-11-05: qty 500

## 2016-11-05 MED ORDER — HEPARIN SODIUM (PORCINE) 5000 UNIT/ML IJ SOLN
5000.0000 [IU] | Freq: Three times a day (TID) | INTRAMUSCULAR | Status: DC
  Administered 2016-11-06 – 2016-11-15 (×29): 5000 [IU] via SUBCUTANEOUS
  Filled 2016-11-05 (×31): qty 1

## 2016-11-05 MED ORDER — METOPROLOL TARTRATE 100 MG PO TABS
100.0000 mg | ORAL_TABLET | Freq: Two times a day (BID) | ORAL | Status: DC
  Administered 2016-11-06 (×2): 100 mg via ORAL
  Filled 2016-11-05 (×3): qty 1

## 2016-11-05 MED ORDER — DEXTROSE 5 % IV SOLN
500.0000 mg | Freq: Once | INTRAVENOUS | Status: DC
  Administered 2016-11-05: 500 mg via INTRAVENOUS
  Filled 2016-11-05: qty 500

## 2016-11-05 MED ORDER — AZITHROMYCIN 500 MG PO TABS
500.0000 mg | ORAL_TABLET | ORAL | Status: DC
  Administered 2016-11-06: 500 mg via ORAL
  Filled 2016-11-05 (×4): qty 1

## 2016-11-05 MED ORDER — ONDANSETRON HCL 4 MG/2ML IJ SOLN: 4.0000 mg | Freq: Four times a day (QID) | INTRAMUSCULAR | Status: DC | PRN

## 2016-11-05 MED ORDER — ATORVASTATIN CALCIUM 20 MG PO TABS
20.0000 mg | ORAL_TABLET | Freq: Every day | ORAL | Status: DC
Start: 2016-11-06 — End: 2016-11-09
  Administered 2016-11-06 – 2016-11-08 (×2): 20 mg via ORAL
  Filled 2016-11-05 (×3): qty 1

## 2016-11-05 MED ORDER — ACETAMINOPHEN 325 MG PO TABS
650.0000 mg | ORAL_TABLET | Freq: Four times a day (QID) | ORAL | Status: DC | PRN
  Administered 2016-11-06 (×2): 650 mg via ORAL
  Filled 2016-11-05 (×2): qty 2

## 2016-11-05 MED ORDER — SODIUM CHLORIDE 0.9 % IV BOLUS (SEPSIS)
1000.0000 mL | Freq: Once | INTRAVENOUS | Status: AC
  Administered 2016-11-05: 1000 mL via INTRAVENOUS

## 2016-11-05 MED ORDER — IPRATROPIUM-ALBUTEROL 0.5-2.5 (3) MG/3ML IN SOLN
3.0000 mL | Freq: Once | RESPIRATORY_TRACT | Status: AC
  Administered 2016-11-05: 3 mL via RESPIRATORY_TRACT
  Filled 2016-11-05: qty 3

## 2016-11-05 MED ORDER — ACETAMINOPHEN 650 MG RE SUPP: 650.0000 mg | Freq: Four times a day (QID) | RECTAL | Status: DC | PRN

## 2016-11-05 MED ORDER — ACETAMINOPHEN 325 MG PO TABS
650.0000 mg | ORAL_TABLET | Freq: Once | ORAL | Status: AC
  Administered 2016-11-05: 650 mg via ORAL
  Filled 2016-11-05: qty 2

## 2016-11-05 NOTE — ED Triage Notes (Signed)
Patient CO of increasing SOB but states that she has been feeling weaker and lethargic and dyspnea with mild exertion. Hy of COPD and did an albuterol treatment this AM that helped a little bit  But she sates that it is coming back

## 2016-11-05 NOTE — ED Provider Notes (Signed)
[facility] DEPT Provider Note   CSN: [id number] Arrival date & time: 11/05/16  1759     History   Chief Complaint Chief Complaint  Patient presents with  . Shortness of Breath    HPI Diana Gardner is a 66 y.o. female.  The history is provided by the patient. No language interpreter was used.  Shortness of Breath    Diana Gardner is a 66 y.o. female who presents to the Emergency Department complaining of sob.  She has a history of COPD and is on baseline 2 L of oxygen by nasal cannula. Over the last 5 days she endorses subjective fevers with chills with cough and increased shortness of breath. Over the last several days she has developed right-sided chest pain that is worse with coughing and deep breaths. No lower extremity edema. No abdominal pain, nausea, vomiting. No prior similar symptoms. She was last hospitalized at [facility] one year ago January for pneumonia.  Several years ago she was hospitalized and intubated for pneumonia.   Past Medical History:  Diagnosis Date  . COPD (chronic obstructive pulmonary disease) ([place])   . Hypertension     Patient Active Problem List   Diagnosis Date Noted  . Sepsis ([place]) 11/05/2016  . Acute respiratory failure with hypoxia ([place]) 11/05/2016  . Acute renal failure (ARF) ([place]) 11/05/2016  . Hyponatremia 11/05/2016  . Microcytic anemia 11/05/2016  . Hyperkalemia 11/05/2016  . Chronic obstructive pulmonary disease ([place]) 01/31/2016    Past Surgical History:  Procedure Laterality Date  . APPENDECTOMY      OB History    No data available       Home Medications    Prior to Admission medications   Medication Sig Start Date End Date Taking? Authorizing Provider  albuterol (PROVENTIL) (2.5 MG/3ML) 0.083% nebulizer solution 1 neb three times daily as needed 01/12/16  Yes Historical Provider, MD  ALPRAZolam (XANAX) 0.25 MG tablet Take 1-2 tablets by mouth daily as needed for anxiety.  11/01/15  Yes Historical Provider, MD    atorvastatin (LIPITOR) 20 MG tablet Take 20 mg by mouth daily.   Yes Historical Provider, MD  COMBIVENT RESPIMAT 20-100 MCG/ACT AERS respimat Inhale 1 puff into the lungs 4 (four) times daily. 09/19/15  Yes Historical Provider, MD  lisinopril-hydrochlorothiazide (PRINZIDE,ZESTORETIC) 20-25 MG tablet Take 1 tablet by mouth daily. 09/21/15  Yes Historical Provider, MD  metoprolol (LOPRESSOR) 100 MG tablet Take 1 tablet by mouth 2 (two) times daily. 10/30/15  Yes Historical Provider, MD    Family History Family History  Problem Relation Age of Onset  . Emphysema Mother   . Heart attack Father     Social History Social History  Substance Use Topics  . Smoking status: Never Smoker  . Smokeless tobacco: Never Used  . Alcohol use 0.0 oz/week     Comment: once in a while she states      Allergies   Patient has no known allergies.   Review of Systems Review of Systems  Respiratory: Positive for shortness of breath.   All other systems reviewed and are negative.    Physical Exam Updated Vital Signs BP 120/69 (BP Location: Left Arm)   Pulse 88   Temp 97.5 F (36.4 C) (Oral)   Resp 18   Ht 5\' 4"  (1.626 m)   Wt 135 lb (61.2 kg)   SpO2 96%   BMI 23.17 kg/m   Physical Exam  Constitutional: She is oriented to person, place, and time. She appears well-developed  and well-nourished.  Ill appearing  HENT:  Head: Normocephalic and atraumatic.  Cardiovascular: Regular rhythm.   No murmur heard. tachycardic  Pulmonary/Chest: Effort normal. No respiratory distress.  Decreased air movement in the right lung base with occasional crackles in the right lung base. No wheezes.  Abdominal: Soft. There is no tenderness. There is no rebound and no guarding.  Musculoskeletal: She exhibits no edema or tenderness.  Neurological: She is alert and oriented to person, place, and time.  Skin: Skin is warm and dry.  Psychiatric: She has a normal mood and affect. Her behavior is normal.  Nursing  note and vitals reviewed.    ED Treatments / Results  Labs (all labs ordered are listed, but only abnormal results are displayed) Labs Reviewed  CBC WITH DIFFERENTIAL/PLATELET - Abnormal; Notable for the following:       Result Value   WBC 24.5 (*)    Hemoglobin 11.6 (*)    HCT 32.8 (*)    Platelets 404 (*)    Neutro Abs 23.1 (*)    All other components within normal limits  BASIC METABOLIC PANEL - Abnormal; Notable for the following:    Sodium 131 (*)    Potassium 5.3 (*)    Chloride 93 (*)    Glucose, Bld 146 (*)    BUN 120 (*)    Creatinine, Ser 3.70 (*)    GFR calc non Af Amer 12 (*)    GFR calc Af Amer 14 (*)    All other components within normal limits  I-STAT CG4 LACTIC ACID, ED - Abnormal; Notable for the following:    Lactic Acid, Venous 2.13 (*)    All other components within normal limits  I-STAT ARTERIAL BLOOD GAS, ED - Abnormal; Notable for the following:    pO2, Arterial 57.0 (*)    All other components within normal limits  URINE CULTURE  MRSA PCR SCREENING  CULTURE, EXPECTORATED SPUTUM-ASSESSMENT  GRAM STAIN  URINALYSIS, ROUTINE W REFLEX MICROSCOPIC  HEPATIC FUNCTION PANEL  STREP PNEUMONIAE URINARY ANTIGEN  PROCALCITONIN  LACTIC ACID, PLASMA  LEGIONELLA PNEUMOPHILA SEROGP 1 UR AG  BASIC METABOLIC PANEL  CBC  SODIUM, URINE, RANDOM  OSMOLALITY, URINE  CREATININE, URINE, RANDOM  PROTEIN / CREATININE RATIO, URINE  OSMOLALITY  IRON AND TIBC  FERRITIN  RETICULOCYTES  LACTATE DEHYDROGENASE  I-STAT TROPOININ, ED    EKG  EKG Interpretation  Date/Time:  Sunday November 05 2016 18:06:44 EDT Ventricular Rate:  107 PR Interval:  124 QRS Duration: 72 QT Interval:  316 QTC Calculation: 421 R Axis:   -24 Text Interpretation:  Sinus tachycardia Possible Anterior infarct , age undetermined Abnormal ECG Artifact Confirmed by Hazle Coca [phone]) on 11/05/2016 6:57:19 PM       Radiology Dg Chest 2 View  Result Date: 11/05/2016 CLINICAL DATA:  Shortness  of breath and cough. EXAM: CHEST  2 VIEW COMPARISON:  August 28, 2015 FINDINGS: The cardiomediastinal silhouette is stable. No pneumothorax. The left lung is clear. New effusion and underlying opacity seen on the right. IMPRESSION: New effusion and underlying opacity seen in the right lower lobe. No other interval changes. Recommend follow-up to resolution. Electronically Signed   By: Dorise Bullion III M.D   On: 11/05/2016 19:03    Procedures Procedures (including critical care time)  Medications Ordered in ED Medications  vancomycin (VANCOCIN) IVPB 1000 mg/200 mL premix (not administered)  atorvastatin (LIPITOR) tablet 20 mg (not administered)  Ipratropium-Albuterol (COMBIVENT) respimat 1 puff (not administered)  metoprolol (LOPRESSOR)  tablet 100 mg (not administered)  cefTRIAXone (ROCEPHIN) 1 g in dextrose 5 % 50 mL IVPB (not administered)  azithromycin (ZITHROMAX) tablet 500 mg (not administered)  heparin injection 5,000 Units (not administered)  sodium chloride flush (NS) 0.9 % injection 3 mL (not administered)  0.9 %  sodium chloride infusion (not administered)  acetaminophen (TYLENOL) tablet 650 mg (not administered)    Or  acetaminophen (TYLENOL) suppository 650 mg (not administered)  ondansetron (ZOFRAN) tablet 4 mg (not administered)    Or  ondansetron (ZOFRAN) injection 4 mg (not administered)  albuterol (PROVENTIL) (2.5 MG/3ML) 0.083% nebulizer solution 2.5 mg (not administered)  albuterol (PROVENTIL) (2.5 MG/3ML) 0.083% nebulizer solution 2.5 mg (not administered)  ipratropium-albuterol (DUONEB) 0.5-2.5 (3) MG/3ML nebulizer solution 3 mL (3 mLs Nebulization Given 11/05/16 1951)  cefTRIAXone (ROCEPHIN) 1 g in dextrose 5 % 50 mL IVPB (0 g Intravenous Stopped 11/05/16 2023)  vancomycin (VANCOCIN) IVPB 1000 mg/200 mL premix (0 mg Intravenous Stopped 11/05/16 2209)  acetaminophen (TYLENOL) tablet 650 mg (650 mg Oral Given 11/05/16 2215)  azithromycin (ZITHROMAX) 500 mg in dextrose 5  % 250 mL IVPB (500 mg Intravenous Transfusing/Transfer 11/05/16 2314)  sodium chloride 0.9 % bolus 1,000 mL (1,000 mLs Intravenous New Bag/Given 11/05/16 2247)     Initial Impression / Assessment and Plan / ED Course  I have reviewed the triage vital signs and the nursing notes.  Pertinent labs & imaging results that were available during my care of the patient were reviewed by me and considered in my medical decision making (see chart for details).     Patient here for evaluation of fevers, cough, right-sided chest pain. Chest x-ray and exam are concerning for pneumonia with pleural effusion. Treating for community acquired pneumonia with possible MRSA infection given pneumonia with an effusion. Labs demonstrate acute kidney injury. Patient reports good oral intake with normal urinary output. She denies any history of prior renal disease and no prior labs are available. Plan to admit to the hospitalist service on the step down unit for further treatment.   Patient does have significant new oxygen requirement. She has a hx/o COPD but no wheezing on examination, provided albuterol treatment once with no significant change in her symptoms.  Repeat lung exam with no wheezes. Discussed with patient and family findings studies and recommendation for admission for treatment.  Final Clinical Impressions(s) / ED Diagnoses   Final diagnoses:  Community acquired pneumonia of right lower lobe of lung ([place])  Acute renal failure, unspecified acute renal failure type [facility])    New Prescriptions Current Discharge Medication List       Quintella Reichert, MD 11/05/16 [phone]

## 2016-11-05 NOTE — ED Notes (Signed)
MD Danford at bedside and requested to take the patient off of non rebreather to [place] at 4 L

## 2016-11-05 NOTE — ED Notes (Signed)
Attempted to call report

## 2016-11-05 NOTE — ED Notes (Signed)
Sent add on label to main lab. 

## 2016-11-05 NOTE — Progress Notes (Signed)
Pharmacy Antibiotic Note  Diana Gardner is a 66 y.o. female admitted on 11/05/2016 with pneumonia.  Pharmacy has been consulted for vancomycin dosing. Afebrile, WBC 24.5, LA 2.13. SCr 3.7 on admit (baseline unclear), CrCl~13. Received 1x doses of ceftriaxone/azithromycin in the ED.  Plan: Vancomycin 1g IV x 1; then 1g IV q48h Monitor clinical progress, c/s, renal function, abx plan/LOT Vancomycin trough as indicated   Height: 5\' 4"  (162.6 cm) Weight: 135 lb (61.2 kg) IBW/kg (Calculated) : 54.7  Temp (24hrs), Avg:97.3 F (36.3 C), Min:97.3 F (36.3 C), Max:97.3 F (36.3 C)   Recent Labs Lab 11/05/16 1941 11/05/16 2003 11/05/16 2100  WBC 24.5*  --   --   CREATININE  --   --  3.70*  LATICACIDVEN  --  2.13*  --     Estimated Creatinine Clearance: 12.7 mL/min (A) (by C-G formula based on SCr of 3.7 mg/dL (H)).    No Known Allergies  Elicia Lamp, PharmD, BCPS Clinical Pharmacist 11/05/2016 9:42 PM

## 2016-11-05 NOTE — ED Notes (Signed)
ABX just now started due to IV maintaniace and difficult start

## 2016-11-05 NOTE — ED Notes (Signed)
I Stat Lactic Acid and I Stat Chem8 results shown to [employer]

## 2016-11-05 NOTE — ED Notes (Signed)
MD notified of patients request for pain medication  

## 2016-11-05 NOTE — ED Notes (Signed)
Attempted to call report. Nurse unavailable informed them that we would have to do bedside report

## 2016-11-06 ENCOUNTER — Inpatient Hospital Stay (HOSPITAL_COMMUNITY): Payer: Medicare Other

## 2016-11-06 DIAGNOSIS — D509 Iron deficiency anemia, unspecified: Secondary | ICD-10-CM

## 2016-11-06 DIAGNOSIS — E875 Hyperkalemia: Secondary | ICD-10-CM

## 2016-11-06 DIAGNOSIS — J9601 Acute respiratory failure with hypoxia: Secondary | ICD-10-CM

## 2016-11-06 DIAGNOSIS — J189 Pneumonia, unspecified organism: Secondary | ICD-10-CM

## 2016-11-06 DIAGNOSIS — R0902 Hypoxemia: Secondary | ICD-10-CM

## 2016-11-06 DIAGNOSIS — E871 Hypo-osmolality and hyponatremia: Secondary | ICD-10-CM

## 2016-11-06 DIAGNOSIS — J449 Chronic obstructive pulmonary disease, unspecified: Secondary | ICD-10-CM

## 2016-11-06 LAB — BLOOD GAS, ARTERIAL
Acid-base deficit: 3.3 mmol/L — ABNORMAL HIGH (ref 0.0–2.0)
BICARBONATE: 22 mmol/L (ref 20.0–28.0)
FIO2: 100
O2 Saturation: 92.8 %
PATIENT TEMPERATURE: 98.6
PH ART: 7.305 — AB (ref 7.350–7.450)
PO2 ART: 72.6 mmHg — AB (ref 83.0–108.0)
pCO2 arterial: 45.6 mmHg (ref 32.0–48.0)

## 2016-11-06 LAB — RETICULOCYTES
RBC.: 3.83 MIL/uL — AB (ref 3.87–5.11)
Retic Count, Absolute: 23 10*3/uL (ref 19.0–186.0)
Retic Ct Pct: 0.6 % (ref 0.4–3.1)

## 2016-11-06 LAB — URINALYSIS, ROUTINE W REFLEX MICROSCOPIC
Bilirubin Urine: NEGATIVE
Glucose, UA: 50 mg/dL — AB
Ketones, ur: NEGATIVE mg/dL
Nitrite: NEGATIVE
PROTEIN: 30 mg/dL — AB
SPECIFIC GRAVITY, URINE: 1.014 (ref 1.005–1.030)
pH: 5 (ref 5.0–8.0)

## 2016-11-06 LAB — BASIC METABOLIC PANEL
ANION GAP: 12 (ref 5–15)
BUN: 109 mg/dL — ABNORMAL HIGH (ref 6–20)
CO2: 23 mmol/L (ref 22–32)
Calcium: 8.5 mg/dL — ABNORMAL LOW (ref 8.9–10.3)
Chloride: 98 mmol/L — ABNORMAL LOW (ref 101–111)
Creatinine, Ser: 3.23 mg/dL — ABNORMAL HIGH (ref 0.44–1.00)
GFR calc Af Amer: 16 mL/min — ABNORMAL LOW (ref >60)
GFR, EST NON AFRICAN AMERICAN: 14 mL/min — AB (ref >60)
Glucose, Bld: 83 mg/dL (ref 65–99)
POTASSIUM: 4.4 mmol/L (ref 3.5–5.1)
Sodium: 133 mmol/L — ABNORMAL LOW (ref 135–145)

## 2016-11-06 LAB — HEPATIC FUNCTION PANEL
ALK PHOS: 126 U/L (ref 38–126)
ALT: 35 U/L (ref 14–54)
AST: 45 U/L — AB (ref 15–41)
Albumin: 1.5 g/dL — ABNORMAL LOW (ref 3.5–5.0)
BILIRUBIN DIRECT: 0.3 mg/dL (ref 0.1–0.5)
BILIRUBIN INDIRECT: 0.2 mg/dL — AB (ref 0.3–0.9)
Total Bilirubin: 0.5 mg/dL (ref 0.3–1.2)
Total Protein: 6.2 g/dL — ABNORMAL LOW (ref 6.5–8.1)

## 2016-11-06 LAB — URINALYSIS, MICROSCOPIC (REFLEX)

## 2016-11-06 LAB — CBC
HEMATOCRIT: 30.9 % — AB (ref 36.0–46.0)
Hemoglobin: 10.3 g/dL — ABNORMAL LOW (ref 12.0–15.0)
MCH: 26.9 pg (ref 26.0–34.0)
MCHC: 33.3 g/dL (ref 30.0–36.0)
MCV: 80.7 fL (ref 78.0–100.0)
Platelets: 242 10*3/uL (ref 150–400)
RBC: 3.83 MIL/uL — ABNORMAL LOW (ref 3.87–5.11)
RDW: 14.7 % (ref 11.5–15.5)
WBC: 20.2 10*3/uL — AB (ref 4.0–10.5)

## 2016-11-06 LAB — LACTATE DEHYDROGENASE: LDH: 194 U/L — ABNORMAL HIGH (ref 98–192)

## 2016-11-06 LAB — IRON AND TIBC
Iron: 16 ug/dL — ABNORMAL LOW (ref 28–170)
SATURATION RATIOS: 11 % (ref 10.4–31.8)
TIBC: 143 ug/dL — ABNORMAL LOW (ref 250–450)
UIBC: 127 ug/dL

## 2016-11-06 LAB — STREP PNEUMONIAE URINARY ANTIGEN: Strep Pneumo Urinary Antigen: POSITIVE — AB

## 2016-11-06 LAB — OSMOLALITY: Osmolality: 318 mOsm/kg — ABNORMAL HIGH (ref 275–295)

## 2016-11-06 LAB — OSMOLALITY, URINE: OSMOLALITY UR: 364 mosm/kg (ref 300–900)

## 2016-11-06 LAB — PROTEIN / CREATININE RATIO, URINE
Creatinine, Urine: 100.83 mg/dL
PROTEIN CREATININE RATIO: 0.68 mg/mg{creat} — AB (ref 0.00–0.15)
TOTAL PROTEIN, URINE: 69 mg/dL

## 2016-11-06 LAB — CREATININE, URINE, RANDOM: Creatinine, Urine: 100.55 mg/dL

## 2016-11-06 LAB — PROCALCITONIN: Procalcitonin: 9.28 ng/mL

## 2016-11-06 LAB — FERRITIN: FERRITIN: 435 ng/mL — AB (ref 11–307)

## 2016-11-06 LAB — LACTIC ACID, PLASMA: LACTIC ACID, VENOUS: 1.2 mmol/L (ref 0.5–1.9)

## 2016-11-06 LAB — MRSA PCR SCREENING: MRSA by PCR: NEGATIVE

## 2016-11-06 LAB — SODIUM, URINE, RANDOM: Sodium, Ur: 36 mmol/L

## 2016-11-06 MED ORDER — IBUPROFEN 100 MG PO CHEW: 100.0000 mg | CHEWABLE_TABLET | Freq: Three times a day (TID) | ORAL | Status: DC | PRN

## 2016-11-06 MED ORDER — OXYCODONE-ACETAMINOPHEN 5-325 MG PO TABS
1.0000 | ORAL_TABLET | Freq: Once | ORAL | Status: AC
  Administered 2016-11-06: 1 via ORAL
  Filled 2016-11-06: qty 1

## 2016-11-06 MED ORDER — FUROSEMIDE 10 MG/ML IJ SOLN
INTRAMUSCULAR | Status: AC
  Administered 2016-11-06: 40 mg
  Filled 2016-11-06: qty 4

## 2016-11-06 MED ORDER — FUROSEMIDE 10 MG/ML IJ SOLN: 40.0000 mg | Freq: Once | INTRAMUSCULAR | Status: DC

## 2016-11-06 MED ORDER — ALBUTEROL SULFATE (2.5 MG/3ML) 0.083% IN NEBU: 2.5000 mg | INHALATION_SOLUTION | Freq: Four times a day (QID) | RESPIRATORY_TRACT | Status: DC | PRN

## 2016-11-06 NOTE — Consult Note (Signed)
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  Found to have an infiltrate on CXR, determined to be CAP.  There was a concern for pleural effusion and patient PCCM was consulted for empyema.  Patient has base line COPD and is 2-3 liter O2 dependent at home on combivent and albuterol.  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  Found to have an infiltrate on CXR, determined to be CAP.  There was a concern for pleural effusion and patient PCCM was consulted for empyema.  Patient has base line COPD and is 2-3 liter O2 dependent at home on combivent and albuterol.  PAST MEDICAL HISTORY :   has a past medical history of COPD (chronic obstructive pulmonary disease) ([place]) and Hypertension.  has a past surgical history that includes Appendectomy. Prior to Admission medications   Medication Sig Start Date End Date Taking? Authorizing Provider  albuterol (PROVENTIL) (2.5 MG/3ML) 0.083% nebulizer solution 1 neb three times daily as needed 01/12/16  Yes Historical Provider, MD  ALPRAZolam (XANAX) 0.25 MG tablet Take 1-2 tablets by mouth daily as needed for anxiety.  11/01/15  Yes Historical Provider, MD  atorvastatin (LIPITOR) 20 MG tablet Take 20 mg by mouth daily.   Yes Historical Provider, MD  COMBIVENT RESPIMAT 20-100 MCG/ACT AERS respimat Inhale 1 puff into the lungs 4 (four) times daily. 09/19/15  Yes Historical Provider, MD    lisinopril-hydrochlorothiazide (PRINZIDE,ZESTORETIC) 20-25 MG tablet Take 1 tablet by mouth daily. 09/21/15  Yes Historical Provider, MD  metoprolol (LOPRESSOR) 100 MG tablet Take 1 tablet by mouth 2 (two) times daily. 10/30/15  Yes Historical Provider, MD   No Known Allergies  FAMILY HISTORY:  family history includes Emphysema in her mother; Heart attack in her father. SOCIAL HISTORY:  reports that she has never smoked. She has never used smokeless tobacco. She reports that she drinks alcohol.  REVIEW OF SYSTEMS:   Constitutional: Negative for fever, chills, weight loss, malaise/fatigue and diaphoresis.  HENT: Negative for hearing loss, ear pain, nosebleeds, congestion, sore throat, neck pain, tinnitus and ear discharge.   Eyes: Negative for blurred vision, double vision, photophobia, pain, discharge and redness.  Respiratory: Negative for cough, hemoptysis, sputum production, shortness of breath, wheezing and stridor.  Pleuritic chest pain on the right. Cardiovascular: Negative for chest pain, palpitations, orthopnea, claudication, leg swelling and PND.  Gastrointestinal: Negative for heartburn, nausea, vomiting, abdominal pain, diarrhea, constipation, blood in stool and melena.  Genitourinary: Negative for dysuria, urgency, frequency, hematuria and flank pain.  Musculoskeletal: Negative for myalgias, back pain, joint pain and falls.  Skin: Negative for itching and rash.  Neurological: Negative for dizziness, tingling, tremors, sensory change, speech change, focal weakness, seizures, loss of consciousness, weakness and headaches.  Endo/Heme/Allergies: Negative for environmental allergies and polydipsia. Does not bruise/bleed easily.  SUBJECTIVE: No new complaints  VITAL SIGNS: Temp:  [97.3 F (36.3 C)-98.7 F (37.1 C)] 97.4 F (36.3 C) (03/19 0800) Pulse Rate:  [  55-108] 55 (03/19 1138) Resp:  [17-26] 23 (03/19 0407) BP: (65-162)/(54-76) 65/55 (03/19 1138) SpO2:  [82 %-99 %] 87 %  (03/19 0802) Weight:  [61.2 kg (135 lb)-65.4 kg (144 lb 2.9 oz)] 65.4 kg (144 lb 2.9 oz) (03/19 0533)  PHYSICAL EXAMINATION: General:  Chronically ill appearing female in NAD Neuro:  Alert and interactive, moving all ext to command HEENT:  [place]/AT, PERRL, EOM-I and MMM Cardiovascular:  RRR, Nl S1/S2, -M/R/G Lungs:  Decreased BS on the RLL with crackles, otherwise a relatively quite chest. Abdomen:  Soft, NT, ND and +BS Musculoskeletal:  -edema and -tenderness Skin:  Intact   Recent Labs Lab 11/05/16 2100 11/06/16 0349  NA 131* 133*  K 5.3* 4.4  CL 93* 98*  CO2 24 23  BUN 120* 109*  CREATININE 3.70* 3.23*  GLUCOSE 146* 83    Recent Labs Lab 11/05/16 1941 11/06/16 0349  HGB 11.6* 10.3*  HCT 32.8* 30.9*  WBC 24.5* 20.2*  PLT 404* 242   Dg Chest 2 View  Result Date: 11/05/2016 CLINICAL DATA:  Shortness of breath and cough. EXAM: CHEST  2 VIEW COMPARISON:  August 28, 2015 FINDINGS: The cardiomediastinal silhouette is stable. No pneumothorax. The left lung is clear. New effusion and underlying opacity seen on the right. IMPRESSION: New effusion and underlying opacity seen in the right lower lobe. No other interval changes. Recommend follow-up to resolution. Electronically Signed   By: Dorise Bullion III M.D   On: 11/05/2016 19:03   US Renal  Result Date: 11/06/2016 CLINICAL DATA:  Sepsis EXAM: RENAL / URINARY TRACT ULTRASOUND COMPLETE COMPARISON:  None. FINDINGS: Right Kidney: Length: 9.4 cm, within normal limits. Echogenicity within normal limits. No mass or hydronephrosis visualized. Left Kidney: Length: 10.9 cm, within normal limits. Echogenicity within normal limits. No mass or hydronephrosis visualized. Bladder: Appears normal for degree of bladder distention. IMPRESSION: Negative bilateral renal ultrasound. Electronically Signed   By: San Morelle M.D.   On: 11/06/2016 12:41   I reviewed CXR myself, RLL infiltrate noted.  ASSESSMENT / PLAN:  66 year old female  with COPD who developed a RLL pneumonia.  PCCM was consulted for pleural effusion and COPD management.  Discussed with [facility]-MD.  PNA:  - Rocephin  - Zithromax  - F/U on cultures.  Pleural effusion: evaluated with U/S, none to tap.  - No thora  - F/U imaging.  COPD:  - Duonebs  - PRN albuterol  - Will add ICS/LABA, can benefit from that upon discharge  Hypoxemia:  - Titrate O2 for sat of 88-92%  - May need to increase home O2, not sure what the sats are at home  Patient seen and examined, agree with above note.  I dictated the care and orders written for this patient under my direction.  Rush Farmer, MD [phone]  11/06/2016, 1:05 PM

## 2016-11-06 NOTE — Progress Notes (Signed)
RT NOTE:  PT is complaining of pain @ this time. CPT not done.

## 2016-11-06 NOTE — Progress Notes (Signed)
CRITICAL VALUE ALERT  Critical value received:  Serum osmolality 318  Date of notification:  11/06/2016   Time of notification:  12:45 AM   Critical value read back: yes  Nurse who received alert:   MD notified (1st page):  Chaney Malling NP  Time of first page:  12:45 AM   MD notified (2nd page):  Time of second page:  Responding MD:    Time MD responded:

## 2016-11-06 NOTE — Progress Notes (Signed)
Patient having SOB O2 sat 75% on 5 L.Patient placed on NRB O2 sat 82%. Resp called breathing treatment given. Patient continues with O2 @ 82-85%. Dr. Hartford Poli notified of situation.

## 2016-11-06 NOTE — Progress Notes (Signed)
Dr. Hartford Poli on unit to assess patient. New orders given. Patient placed on bipap by RT. Will continue to monitor.

## 2016-11-06 NOTE — H&P (Signed)
History and Physical  Patient Name: Diana Gardner     VOH:[id number]    DOB: 1951-01-12    DOA: 11/05/2016 PCP: [name], MD   Patient coming from: Home  Chief Complaint: Dyspnea  HPI: Diana Gardner is a 66 y.o. female with a past medical history significant for severe COPD/emphysema and recurrent pneumonias who presents with cough and fever and malaise.  The patient was in her usual state of health until about 4-5 days ago when she developed typical URI symptoms, cough and fever.  She started taking ibuprofen 400 mg BID, but then over the next few days felt gradually worse and worse with malaise, nausea, weakness, cough, fever, and right sided chest/flank pain and her cough was not controlled with home inhalers and so tonight she came to the ER.    ED course: -Afebrile, heart rate 108, respirations 24, pulse ox low 90s, BP 116/76 -Na 131, K 5.3, Cr 3.7 (baseline unknown), WBC 24.5K, Hgb 11.6 and microcytic -Chest x-ray showed a right-sided effusion, right lower lobe opacity -Lactate 2.13 -Troponin negative -ECG sinus tachycardia -She was given vancomycin, ceftriaxone, and azithromycin for community-acquired pneumonia, concern for post-influenza MRSA pneumonia, and [facility] was asked to evaluate for admission      ROS: Review of Systems  Constitutional: Positive for chills, fever and malaise/fatigue.  Respiratory: Positive for cough, sputum production, shortness of breath and wheezing.   Cardiovascular: Positive for chest pain.  Gastrointestinal: Positive for nausea.  Neurological: Positive for weakness. Negative for focal weakness.  All other systems reviewed and are negative.         Past Medical History:  Diagnosis Date  . COPD (chronic obstructive pulmonary disease) ([place])   . Hypertension     Past Surgical History:  Procedure Laterality Date  . APPENDECTOMY      Social History: Patient lives with her son.  The patient walks unassisted.  She takes care of  children.  She does not smoke, her husband was a lifelong heavy smoker.  She lives in [place].  She still drives.    No Known Allergies  Family history: family history includes Emphysema in her mother; Heart attack in her father.  Prior to Admission medications   Medication Sig Start Date End Date Taking? Authorizing Provider  albuterol (PROVENTIL) (2.5 MG/3ML) 0.083% nebulizer solution 1 neb three times daily as needed 01/12/16  Yes Historical Provider, MD  ALPRAZolam (XANAX) 0.25 MG tablet Take 1-2 tablets by mouth daily as needed for anxiety.  11/01/15  Yes Historical Provider, MD  atorvastatin (LIPITOR) 20 MG tablet Take 20 mg by mouth daily.   Yes Historical Provider, MD  COMBIVENT RESPIMAT 20-100 MCG/ACT AERS respimat Inhale 1 puff into the lungs 4 (four) times daily. 09/19/15  Yes Historical Provider, MD  lisinopril-hydrochlorothiazide (PRINZIDE,ZESTORETIC) 20-25 MG tablet Take 1 tablet by mouth daily. 09/21/15  Yes Historical Provider, MD  metoprolol (LOPRESSOR) 100 MG tablet Take 1 tablet by mouth 2 (two) times daily. 10/30/15  Yes Historical Provider, MD       Physical Exam: BP 100/64   Pulse 88   Temp 98.7 F (37.1 C) (Oral)   Resp 17   Ht 5\' 4"  (1.626 m)   Wt 65.3 kg (143 lb 15.4 oz)   SpO2 98%   BMI 24.71 kg/m  General appearance: Thin elderly adult female, alert and in mild distress from dyspnea and malaise.   Eyes: Anicteric, conjunctiva pink, lids and lashes normal. PERRL.    ENT: No nasal deformity,  discharge, epistaxis.  Hearing normal. OP dry without lesions.   Neck: No neck masses.  Trachea midline.  No thyromegaly/tenderness. Lymph: No cervical or supraclavicular lymphadenopathy. Skin: Hot, diaphoretic, no mottling.  No jaundice.  No suspicious rashes or lesions. Cardiac: Tachycardic, regular, nl S1-S2, no murmurs appreciated.  Capillary refill is brisk.  JVP not visible.  No LE edema.  Radial and DP pulses 2+ and symmetric. Respiratory: Normal respiratory rate  and rhythm.  Wheezing throughout and rales on the right. Abdomen: Abdomen soft.  No TTP. No ascites, distension, hepatosplenomegaly.   MSK: No deformities or effusions.  No cyanosis or clubbing. Neuro: Cranial nerves normal.  Sensation intact to light touch. Speech is fluent.  Muscle strength normal.    Psych: Sensorium intact and responding to questions, attention normal.  Behavior appropriate.  Affect normal.  Judgment and insight appear normal.     Labs on Admission:  I have personally reviewed following labs and imaging studies: CBC:  Recent Labs Lab 11/05/16 1941  WBC 24.5*  NEUTROABS 23.1*  HGB 11.6*  HCT 32.8*  MCV 79.4  PLT 259*   Basic Metabolic Panel:  Recent Labs Lab 11/05/16 2100  NA 131*  K 5.3*  CL 93*  CO2 24  GLUCOSE 146*  BUN 120*  CREATININE 3.70*  CALCIUM 9.5   GFR: Estimated Creatinine Clearance: 12.7 mL/min (A) (by C-G formula based on SCr of 3.7 mg/dL (H)).  Liver Function Tests:  Recent Labs Lab 11/05/16 2331  AST 45*  ALT 35  ALKPHOS 126  BILITOT 0.5  PROT 6.2*  ALBUMIN 1.5*   No results for input(s): LIPASE, AMYLASE in the last 168 hours. No results for input(s): AMMONIA in the last 168 hours. Coagulation Profile: No results for input(s): INR, PROTIME in the last 168 hours. Cardiac Enzymes: No results for input(s): CKTOTAL, CKMB, CKMBINDEX, TROPONINI in the last 168 hours. BNP (last 3 results) No results for input(s): PROBNP in the last 8760 hours. HbA1C: No results for input(s): HGBA1C in the last 72 hours. CBG: No results for input(s): GLUCAP in the last 168 hours. Lipid Profile: No results for input(s): CHOL, HDL, LDLCALC, TRIG, CHOLHDL, LDLDIRECT in the last 72 hours. Thyroid Function Tests: No results for input(s): TSH, T4TOTAL, FREET4, T3FREE, THYROIDAB in the last 72 hours. Anemia Panel: No results for input(s): VITAMINB12, FOLATE, FERRITIN, TIBC, IRON, RETICCTPCT in the last 72 hours. Sepsis Labs: Lactic acid  2.13 Invalid input(s): PROCALCITONIN, LACTICIDVEN Recent Results (from the past 240 hour(s))  MRSA PCR Screening     Status: None   Collection Time: 11/05/16 11:31 PM  Result Value Ref Range Status   MRSA by PCR NEGATIVE NEGATIVE Final    Comment:        The GeneXpert MRSA Assay (FDA approved for NASAL specimens only), is one component of a comprehensive MRSA colonization surveillance program. It is not intended to diagnose MRSA infection nor to guide or monitor treatment for MRSA infections.          Radiological Exams on Admission: Personally reviewed CXR shows right sided pneumonia and effusion, was reviewed with radiology: Dg Chest 2 View  Result Date: 11/05/2016 CLINICAL DATA:  Shortness of breath and cough. EXAM: CHEST  2 VIEW COMPARISON:  August 28, 2015 FINDINGS: The cardiomediastinal silhouette is stable. No pneumothorax. The left lung is clear. New effusion and underlying opacity seen on the right. IMPRESSION: New effusion and underlying opacity seen in the right lower lobe. No other interval changes. Recommend follow-up to resolution.  Electronically Signed   By: Dorise Bullion III M.D   On: 11/05/2016 19:03    EKG: Independently reviewed. Rate 107, QTc 421, sinus tachycardai.        Assessment/Plan Principal Problem:   Sepsis ([place]) Active Problems:   Chronic obstructive pulmonary disease (HCC)   Acute respiratory failure with hypoxia (HCC)   Acute renal failure (ARF) (HCC)   Hyponatremia   Microcytic anemia   Hyperkalemia  1. Sepsis from pneumonia:  Suspected source pneumonia, possible empyema.  Doubt UTI. Organism unknown.   Patient meets criteria given tachycardia, tachypnea, leukocytosis, and evidence of organ dysfunction.  Lactate 2.1 mmol/L and repeat ordered within 6 hours.  This patient is not at high risk of poor outcomes with a qSOFA score of 1.  Antibiotics delivered in the ED.    -Sepsis bundle utilized:  -Blood and urine cultures  drawn  -Fluid bolus given in ED, will repeat lactic acid  -Antibiotics: vancomycin, ceftriaxone and azithromycin  -D/c vanc if MRSA swab negative  -Trend procalcitonin   -Sputum culture  -Thoracentesis  -Solumedrol 60 daily for COPD flare  -Urine pneumonia antigens  -Repeat renal function and complete blood count in AM  -Code SEPSIS called to E-link    2. Pleural effusion:  Reviewed with radiology.  May be enough to tap. -[place] thora ordered -If sufficient fluid present: diagnostic and therapeutic tap -Obtain cell counts, cultures, LDH, protein  3. Acute renal failure:  Suspect from NSAIDs in setting of ACEi use, dehdyration.  No casts on UA, mild protein, lots of red and white cells.  Urine culture pending. -Fluids -Trend renal function -Renal US ordered  4. Hyponatremia:  Hypovolemic on exam -Check urine sodium and osmolality ratios -Fluids and trend BMP  5. Anemia:  Unclear cause  -Check iron stores, reticulocytes  6. Hypokalemia:  Mild, from #3. -Fluids and trend BMP  7. COPD:  -Solu-Medrol daily -Albuterol when necessary     DVT prophylaxis: Heparin Code Status: FULL  Family Communication: Daughter at bedside  Disposition Plan: Anticipate IV fluids, empiric antibiotics, follow culture data, BiPAP if necessary, hopefully thoracentesis tomorrow. Consults called: None ovenright Admission status: INPATIENT        Medical decision making: Patient seen at 10:00 PM on 11/05/2016.  The patient was discussed with Dr. Ralene Bathe.  What exists of the patient's chart was reviewed in depth and summarized above.  Clinical condition: stable.        Edwin Dada [facility] Pager [phone]       At the time of admission, it appears that the appropriate admission status for this patient is INPATIENT. This is judged to be reasonable and necessary in order to provide the required intensity of service to ensure the patient's safety given the presenting  symptoms, physical exam findings, and initial radiographic and laboratory data in the context of their chronic comorbidities.  Together, these circumstances are felt to place her at high risk for further clinical deterioration threatening life, limb, or organ.   Patient requires inpatient status due to high intensity of service, high risk for further deterioration and high frequency of surveillance required because of this acute illness that poses a threat to life, limb or bodily function.  I certify that at the point of admission it is my clinical judgment that the patient will require inpatient hospital care spanning beyond 2 midnights from the point of admission and that early discharge would result in unnecessary risk of decompensation and readmission or threat to life,  limb or bodily function.

## 2016-11-06 NOTE — Progress Notes (Addendum)
PROGRESS NOTE  Diana Gardner  LEX:[id number] DOB: 1951/04/28 DOA: 11/05/2016 PCP: [name], MD Outpatient Specialists:  Subjective: Seen while she was getting breathing treatment, oxygen saturation in the high 80s low 90s on 5 L of oxygen. Patient has air hunger and reported SOB. Receiving IV fluids, in spite of her elevated creatinine she looks fluids overloaded,IV fluids discontinued and started on Lasix.  Brief Narrative:  Diana Gardner is a 66 y.o. female with a past medical history significant for severe COPD/emphysema and recurrent pneumonias who presents with cough and fever and malaise. The patient was in her usual state of health until about 4-5 days ago when she developed typical URI symptoms, cough and fever.  She started taking ibuprofen 400 mg BID, but then over the next few days felt gradually worse and worse with malaise, nausea, weakness, cough, fever, and right sided chest/flank pain and her cough was not controlled with home inhalers and so tonight she came to the ER.    Assessment & Plan:   Principal Problem:   Sepsis ([place]) Active Problems:   Chronic obstructive pulmonary disease (HCC)   Acute respiratory failure with hypoxia (HCC)   Acute renal failure (ARF) (HCC)   Hyponatremia   Microcytic anemia   Hyperkalemia   Sepsis -Presented with sepsis, WBC 24.5, heart rate is 112 in presence of pneumonia. -Given IV fluids were started on broad-spectrum antibiotics. -This is likely secondary to community-acquired pneumonia. -Mild elevation of her lactic acid 2.13, procalcitonin is 9.28. -Critical care consultant  Pneumonia -Right lower lobe, CAP, unspecified organism with associated effusion, unclear if it's parapneumonic or empyema. -Has history of COPD and has some wheezing so started on steroids. -Supportive management with bronchodilators, mucolytics and oxygen as needed.  Acute respiratory failure with hypoxia -Oxygen saturation dropped to the  80 is on 2 L of oxygen, currently on 5 L. -This is secondary to right-sided pneumonia and pleural effusion, continue oxygen supplement  Pleural effusion:  -Ultrasound thoracentesis pending -Obtain cell counts, cultures, LDH, protein  Acute renal failure:  -unclear etiology but suspect secondary to NSAIDs and ACE inhibitors use. -This is probably complicated the picture of dehydration, sepsis as well. -UA without acute findings, ultrasound pending, follow urine output closely. -She was on IV fluids, she wasin moderate distress this morning, IV fluids discontinued started on Lasix. -Check BMP in a.m.  Hyponatremia:  Hypovolemic on exam -Check urine sodium and osmolality ratios -Fluids and trend BMP  Anemia:  Unclear cause  -Check iron stores, reticulocytes  Hypokalemia:  Mild, from #3. -Fluids and trend BMP  COPD:  -Solu-Medrol daily -Albuterol when necessary    DVT prophylaxis: SQ Heparin Code Status: Full Code Family Communication:  Disposition Plan:  Diet: Diet regular Room service appropriate? Yes; Fluid consistency: Thin  Consultants:   none  Procedures:   None  Antimicrobials:   Rocephin and Zithromax  Objective: Vitals:   11/06/16 0533 11/06/16 0800 11/06/16 0802 11/06/16 0946  BP:    (!) 105/55  Pulse:    60  Resp:      Temp:  97.4 F (36.3 C)    TempSrc:  Oral    SpO2:   (!) 87%   Weight: 65.4 kg (144 lb 2.9 oz)     Height:        Intake/Output Summary (Last 24 hours) at 11/06/16 1020 Last data filed at 11/06/16 0953  Gross per 24 hour  Intake           794.67 ml  Output              800 ml  Net            -5.33 ml   Filed Weights   11/05/16 1806 11/05/16 2333 11/06/16 0533  Weight: 61.2 kg (135 lb) 65.3 kg (143 lb 15.4 oz) 65.4 kg (144 lb 2.9 oz)    Examination: General exam: Appears calm and comfortable  Respiratory system: Clear to auscultation. Respiratory effort normal. Cardiovascular system: S1 & S2 heard, RRR. No  JVD, murmurs, rubs, gallops or clicks. No pedal edema. Gastrointestinal system: Abdomen is nondistended, soft and nontender. No organomegaly or masses felt. Normal bowel sounds heard. Central nervous system: Alert and oriented. No focal neurological deficits. Extremities: Symmetric 5 x 5 power. Skin: No rashes, lesions or ulcers Psychiatry: Judgement and insight appear normal. Mood & affect appropriate.   Data Reviewed: I have personally reviewed following labs and imaging studies  CBC:  Recent Labs Lab 11/05/16 1941 11/06/16 0349  WBC 24.5* 20.2*  NEUTROABS 23.1*  --   HGB 11.6* 10.3*  HCT 32.8* 30.9*  MCV 79.4 80.7  PLT 404* 614   Basic Metabolic Panel:  Recent Labs Lab 11/05/16 2100 11/06/16 0349  NA 131* 133*  K 5.3* 4.4  CL 93* 98*  CO2 24 23  GLUCOSE 146* 83  BUN 120* 109*  CREATININE 3.70* 3.23*  CALCIUM 9.5 8.5*   GFR: Estimated Creatinine Clearance: 14.6 mL/min (A) (by C-G formula based on SCr of 3.23 mg/dL (H)). Liver Function Tests:  Recent Labs Lab 11/05/16 2331  AST 45*  ALT 35  ALKPHOS 126  BILITOT 0.5  PROT 6.2*  ALBUMIN 1.5*   No results for input(s): LIPASE, AMYLASE in the last 168 hours. No results for input(s): AMMONIA in the last 168 hours. Coagulation Profile: No results for input(s): INR, PROTIME in the last 168 hours. Cardiac Enzymes: No results for input(s): CKTOTAL, CKMB, CKMBINDEX, TROPONINI in the last 168 hours. BNP (last 3 results) No results for input(s): PROBNP in the last 8760 hours. HbA1C: No results for input(s): HGBA1C in the last 72 hours. CBG: No results for input(s): GLUCAP in the last 168 hours. Lipid Profile: No results for input(s): CHOL, HDL, LDLCALC, TRIG, CHOLHDL, LDLDIRECT in the last 72 hours. Thyroid Function Tests: No results for input(s): TSH, T4TOTAL, FREET4, T3FREE, THYROIDAB in the last 72 hours. Anemia Panel:  Recent Labs  11/06/16 0349  FERRITIN 435*  TIBC 143*  IRON 16*  RETICCTPCT 0.6    Urine analysis:    Component Value Date/Time   COLORURINE YELLOW 11/05/2016 2331   APPEARANCEUR CLOUDY (A) 11/05/2016 2331   LABSPEC 1.014 11/05/2016 2331   PHURINE 5.0 11/05/2016 2331   GLUCOSEU 50 (A) 11/05/2016 2331   HGBUR LARGE (A) 11/05/2016 2331   BILIRUBINUR NEGATIVE 11/05/2016 2331   KETONESUR NEGATIVE 11/05/2016 2331   PROTEINUR 30 (A) 11/05/2016 2331   NITRITE NEGATIVE 11/05/2016 2331   LEUKOCYTESUR LARGE (A) 11/05/2016 2331   Sepsis Labs: @LABRCNTIP (procalcitonin:4,lacticidven:4)  ) Recent Results (from the past 240 hour(s))  MRSA PCR Screening     Status: None   Collection Time: 11/05/16 11:31 PM  Result Value Ref Range Status   MRSA by PCR NEGATIVE NEGATIVE Final    Comment:        The GeneXpert MRSA Assay (FDA approved for NASAL specimens only), is one component of a comprehensive MRSA colonization surveillance program. It is not intended to diagnose MRSA infection nor to guide or monitor treatment  for MRSA infections.      Invalid input(s): PROCALCITONIN, LACTICACIDVEN   Radiology Studies: Dg Chest 2 View  Result Date: 11/05/2016 CLINICAL DATA:  Shortness of breath and cough. EXAM: CHEST  2 VIEW COMPARISON:  August 28, 2015 FINDINGS: The cardiomediastinal silhouette is stable. No pneumothorax. The left lung is clear. New effusion and underlying opacity seen on the right. IMPRESSION: New effusion and underlying opacity seen in the right lower lobe. No other interval changes. Recommend follow-up to resolution. Electronically Signed   By: Dorise Bullion III M.D   On: 11/05/2016 19:03        Scheduled Meds: . atorvastatin  20 mg Oral Daily  . azithromycin  500 mg Oral Q24H  . cefTRIAXone (ROCEPHIN)  IV  1 g Intravenous Q24H  . furosemide  40 mg Intravenous Once  . heparin  5,000 Units Subcutaneous Q8H  . ipratropium-albuterol  3 mL Inhalation QID  . metoprolol  100 mg Oral BID  . sodium chloride flush  3 mL Intravenous Q12H   Continuous  Infusions:   LOS: 1 day    Time spent: 35 minutes    , A, MD [facility] Pager [phone]  If 7PM-7AM, please contact night-coverage [email or web address] Password [email or web address] 11/06/2016, 10:20 AM

## 2016-11-07 ENCOUNTER — Inpatient Hospital Stay (HOSPITAL_COMMUNITY): Payer: Medicare Other

## 2016-11-07 ENCOUNTER — Other Ambulatory Visit (HOSPITAL_COMMUNITY): Payer: Self-pay

## 2016-11-07 DIAGNOSIS — A419 Sepsis, unspecified organism: Principal | ICD-10-CM

## 2016-11-07 DIAGNOSIS — J90 Pleural effusion, not elsewhere classified: Secondary | ICD-10-CM

## 2016-11-07 LAB — BLOOD GAS, ARTERIAL
ACID-BASE DEFICIT: 13.4 mmol/L — AB (ref 0.0–2.0)
Acid-base deficit: 10.2 mmol/L — ABNORMAL HIGH (ref 0.0–2.0)
Bicarbonate: 17.7 mmol/L — ABNORMAL LOW (ref 20.0–28.0)
Bicarbonate: 14.7 mmol/L — ABNORMAL LOW (ref 20.0–28.0)
DRAWN BY: 406621
Delivery systems: POSITIVE
Drawn by: 274071
EXPIRATORY PAP: 8
FIO2: 100
FIO2: 100
INSPIRATORY PAP: 18
LHR: 20 {breaths}/min
MODE: POSITIVE
O2 SAT: 95.5 %
O2 Saturation: 97.8 %
PATIENT TEMPERATURE: 98.6
PCO2 ART: 44.1 mmHg (ref 32.0–48.0)
PEEP: 5 cmH2O
PH ART: 7.125 — AB (ref 7.350–7.450)
Patient temperature: 93
VT: 470 mL
pCO2 arterial: 57.3 mmHg — ABNORMAL HIGH (ref 32.0–48.0)
pH, Arterial: 7.116 — CL (ref 7.350–7.450)
pO2, Arterial: 140 mmHg — ABNORMAL HIGH (ref 83.0–108.0)
pO2, Arterial: 84 mmHg (ref 83.0–108.0)

## 2016-11-07 LAB — POCT I-STAT 3, ART BLOOD GAS (G3+)
ACID-BASE DEFICIT: 12 mmol/L — AB (ref 0.0–2.0)
BICARBONATE: 15.2 mmol/L — AB (ref 20.0–28.0)
O2 Saturation: 95 %
PO2 ART: 93 mmHg (ref 83.0–108.0)
Patient temperature: 99
TCO2: 16 mmol/L (ref 0–100)
pCO2 arterial: 40 mmHg (ref 32.0–48.0)
pH, Arterial: 7.19 — CL (ref 7.350–7.450)

## 2016-11-07 LAB — BASIC METABOLIC PANEL
Anion gap: 16 — ABNORMAL HIGH (ref 5–15)
Anion gap: 18 — ABNORMAL HIGH (ref 5–15)
BUN: 125 mg/dL — AB (ref 6–20)
BUN: 129 mg/dL — AB (ref 6–20)
CHLORIDE: 97 mmol/L — AB (ref 101–111)
CHLORIDE: 103 mmol/L (ref 101–111)
CO2: 20 mmol/L — AB (ref 22–32)
CO2: 20 mmol/L — AB (ref 22–32)
CREATININE: 3.09 mg/dL — AB (ref 0.44–1.00)
Calcium: 8.2 mg/dL — ABNORMAL LOW (ref 8.9–10.3)
Calcium: 6.7 mg/dL — ABNORMAL LOW (ref 8.9–10.3)
Creatinine, Ser: 3.42 mg/dL — ABNORMAL HIGH (ref 0.44–1.00)
GFR calc Af Amer: 17 mL/min — ABNORMAL LOW (ref >60)
GFR calc Af Amer: 15 mL/min — ABNORMAL LOW (ref >60)
GFR calc non Af Amer: 15 mL/min — ABNORMAL LOW (ref >60)
GFR calc non Af Amer: 13 mL/min — ABNORMAL LOW (ref >60)
Glucose, Bld: 86 mg/dL (ref 65–99)
Glucose, Bld: 151 mg/dL — ABNORMAL HIGH (ref 65–99)
Potassium: 4.8 mmol/L (ref 3.5–5.1)
Potassium: 4.9 mmol/L (ref 3.5–5.1)
SODIUM: 141 mmol/L (ref 135–145)
Sodium: 133 mmol/L — ABNORMAL LOW (ref 135–145)

## 2016-11-07 LAB — CBC
HCT: 37.2 % (ref 36.0–46.0)
HEMATOCRIT: 34.3 % — AB (ref 36.0–46.0)
Hemoglobin: 12.3 g/dL (ref 12.0–15.0)
Hemoglobin: 11 g/dL — ABNORMAL LOW (ref 12.0–15.0)
MCH: 27.5 pg (ref 26.0–34.0)
MCH: 27.3 pg (ref 26.0–34.0)
MCHC: 33.1 g/dL (ref 30.0–36.0)
MCHC: 32.1 g/dL (ref 30.0–36.0)
MCV: 83.2 fL (ref 78.0–100.0)
MCV: 85.1 fL (ref 78.0–100.0)
PLATELETS: 333 10*3/uL (ref 150–400)
Platelets: 357 10*3/uL (ref 150–400)
RBC: 4.47 MIL/uL (ref 3.87–5.11)
RBC: 4.03 MIL/uL (ref 3.87–5.11)
RDW: 15.6 % — AB (ref 11.5–15.5)
RDW: 16.4 % — AB (ref 11.5–15.5)
WBC: 23.5 10*3/uL — ABNORMAL HIGH (ref 4.0–10.5)
WBC: 30 10*3/uL — AB (ref 4.0–10.5)

## 2016-11-07 LAB — LACTIC ACID, PLASMA: Lactic Acid, Venous: 1.7 mmol/L (ref 0.5–1.9)

## 2016-11-07 LAB — URINE CULTURE

## 2016-11-07 LAB — LEGIONELLA PNEUMOPHILA SEROGP 1 UR AG: L. pneumophila Serogp 1 Ur Ag: NEGATIVE

## 2016-11-07 LAB — PROCALCITONIN: Procalcitonin: 6.83 ng/mL

## 2016-11-07 MED ORDER — MORPHINE SULFATE (PF) 2 MG/ML IV SOLN
1.0000 mg | INTRAVENOUS | Status: DC | PRN
  Administered 2016-11-07: 1 mg via INTRAVENOUS
  Filled 2016-11-07: qty 1

## 2016-11-07 MED ORDER — INSULIN ASPART 100 UNIT/ML ~~LOC~~ SOLN: 2.0000 [IU] | SUBCUTANEOUS | Status: DC

## 2016-11-07 MED ORDER — METHYLPREDNISOLONE SODIUM SUCC 40 MG IJ SOLR
40.0000 mg | Freq: Two times a day (BID) | INTRAMUSCULAR | Status: DC
  Administered 2016-11-07: 40 mg via INTRAVENOUS
  Filled 2016-11-07 (×2): qty 1

## 2016-11-07 MED ORDER — CHLORHEXIDINE GLUCONATE 0.12% ORAL RINSE (MEDLINE KIT)
15.0000 mL | Freq: Two times a day (BID) | OROMUCOSAL | Status: DC
  Administered 2016-11-07 – 2016-11-20 (×27): 15 mL via OROMUCOSAL

## 2016-11-07 MED ORDER — FENTANYL CITRATE (PF) 100 MCG/2ML IJ SOLN
100.0000 ug | Freq: Once | INTRAMUSCULAR | Status: AC
  Administered 2016-11-07: 100 ug via INTRAVENOUS

## 2016-11-07 MED ORDER — ALPRAZOLAM 0.5 MG PO TABS
0.5000 mg | ORAL_TABLET | Freq: Once | ORAL | Status: AC
  Administered 2016-11-07: 0.5 mg via ORAL
  Filled 2016-11-07: qty 1

## 2016-11-07 MED ORDER — ORAL CARE MOUTH RINSE: 15.0000 mL | Freq: Four times a day (QID) | OROMUCOSAL | Status: DC

## 2016-11-07 MED ORDER — SODIUM CHLORIDE 0.9 % IV BOLUS (SEPSIS)
1000.0000 mL | Freq: Once | INTRAVENOUS | Status: AC
  Administered 2016-11-07: 1000 mL via INTRAVENOUS

## 2016-11-07 MED ORDER — SODIUM BICARBONATE 8.4 % IV SOLN
INTRAVENOUS | Status: AC
  Filled 2016-11-07: qty 50

## 2016-11-07 MED ORDER — DEXTROSE 5 % IV SOLN
0.0000 ug/min | INTRAVENOUS | Status: DC
  Administered 2016-11-07: 40 ug/min via INTRAVENOUS
  Administered 2016-11-08: 18 ug/min via INTRAVENOUS
  Administered 2016-11-08: 45 ug/min via INTRAVENOUS
  Administered 2016-11-08: 20 ug/min via INTRAVENOUS
  Filled 2016-11-07 (×3): qty 16

## 2016-11-07 MED ORDER — FUROSEMIDE 10 MG/ML IJ SOLN
40.0000 mg | Freq: Once | INTRAMUSCULAR | Status: AC
  Administered 2016-11-07: 40 mg via INTRAVENOUS
  Filled 2016-11-07: qty 4

## 2016-11-07 MED ORDER — SODIUM CHLORIDE 0.9 % IV SOLN
INTRAVENOUS | Status: DC
  Administered 2016-11-07: 18:00:00 via INTRAVENOUS

## 2016-11-07 MED ORDER — VASOPRESSIN 20 UNIT/ML IV SOLN
0.0300 [IU]/min | INTRAVENOUS | Status: DC
  Administered 2016-11-07: 0.03 [IU]/min via INTRAVENOUS
  Filled 2016-11-07: qty 2

## 2016-11-07 MED ORDER — ETOMIDATE 2 MG/ML IV SOLN
10.0000 mg | Freq: Once | INTRAVENOUS | Status: AC
  Administered 2016-11-07: 10 mg via INTRAVENOUS

## 2016-11-07 MED ORDER — HYDROCORTISONE NA SUCCINATE PF 100 MG IJ SOLR
50.0000 mg | Freq: Four times a day (QID) | INTRAMUSCULAR | Status: DC
  Administered 2016-11-07 – 2016-11-08 (×5): 50 mg via INTRAVENOUS
  Filled 2016-11-07 (×2): qty 2
  Filled 2016-11-07: qty 1
  Filled 2016-11-07: qty 2
  Filled 2016-11-07 (×3): qty 1

## 2016-11-07 MED ORDER — SODIUM BICARBONATE 8.4 % IV SOLN
50.0000 meq | Freq: Once | INTRAVENOUS | Status: AC
  Administered 2016-11-07: 50 meq via INTRAVENOUS
  Filled 2016-11-07: qty 50

## 2016-11-07 MED ORDER — FENTANYL CITRATE (PF) 100 MCG/2ML IJ SOLN
50.0000 ug | INTRAMUSCULAR | Status: DC | PRN
  Administered 2016-11-08 – 2016-11-09 (×4): 50 ug via INTRAVENOUS
  Filled 2016-11-07 (×5): qty 2

## 2016-11-07 MED ORDER — MIDAZOLAM HCL 2 MG/2ML IJ SOLN
1.0000 mg | INTRAMUSCULAR | Status: DC | PRN
  Administered 2016-11-09 – 2016-11-20 (×10): 1 mg via INTRAVENOUS
  Filled 2016-11-07 (×11): qty 2

## 2016-11-07 MED ORDER — CHLORHEXIDINE GLUCONATE CLOTH 2 % EX PADS
6.0000 | MEDICATED_PAD | Freq: Every day | CUTANEOUS | Status: DC
  Administered 2016-11-07 – 2016-11-15 (×9): 6 via TOPICAL

## 2016-11-07 MED ORDER — MIDAZOLAM HCL 2 MG/2ML IJ SOLN
2.0000 mg | Freq: Once | INTRAMUSCULAR | Status: AC
  Administered 2016-11-07: 2 mg via INTRAVENOUS

## 2016-11-07 MED ORDER — NOREPINEPHRINE BITARTRATE 1 MG/ML IV SOLN
0.0000 ug/min | INTRAVENOUS | Status: DC
  Administered 2016-11-07: 6 ug/min via INTRAVENOUS
  Administered 2016-11-07: 40 ug/min via INTRAVENOUS
  Filled 2016-11-07 (×2): qty 4

## 2016-11-07 MED ORDER — ORAL CARE MOUTH RINSE
15.0000 mL | Freq: Four times a day (QID) | OROMUCOSAL | Status: DC
  Administered 2016-11-07 – 2016-11-20 (×49): 15 mL via OROMUCOSAL

## 2016-11-07 MED ORDER — SODIUM CHLORIDE 0.9 % IV SOLN
0.0000 ug/min | INTRAVENOUS | Status: DC
  Administered 2016-11-07 (×2): 150 ug/min via INTRAVENOUS
  Administered 2016-11-07: 50 ug/min via INTRAVENOUS
  Filled 2016-11-07 (×4): qty 1

## 2016-11-07 MED ORDER — SODIUM BICARBONATE 8.4 % IV SOLN
INTRAVENOUS | Status: DC
  Administered 2016-11-07: 21:00:00 via INTRAVENOUS
  Filled 2016-11-07 (×2): qty 100

## 2016-11-07 MED ORDER — SODIUM BICARBONATE 8.4 % IV SOLN
INTRAVENOUS | Status: DC
  Administered 2016-11-07 – 2016-11-09 (×3): via INTRAVENOUS
  Filled 2016-11-07 (×9): qty 150

## 2016-11-07 MED ORDER — FENTANYL CITRATE (PF) 100 MCG/2ML IJ SOLN
50.0000 ug | INTRAMUSCULAR | Status: AC | PRN
  Administered 2016-11-07 – 2016-11-08 (×3): 50 ug via INTRAVENOUS
  Filled 2016-11-07 (×2): qty 2

## 2016-11-07 MED ORDER — CHLORHEXIDINE GLUCONATE 0.12% ORAL RINSE (MEDLINE KIT): 15.0000 mL | Freq: Two times a day (BID) | OROMUCOSAL | Status: DC

## 2016-11-07 MED ORDER — SODIUM CHLORIDE 0.9% FLUSH
10.0000 mL | Freq: Two times a day (BID) | INTRAVENOUS | Status: DC
  Administered 2016-11-07 – 2016-11-08 (×2): 30 mL
  Administered 2016-11-09: 10 mL
  Administered 2016-11-09: 30 mL
  Administered 2016-11-10 – 2016-11-12 (×3): 10 mL
  Administered 2016-11-13: 20 mL
  Administered 2016-11-13 – 2016-11-15 (×4): 10 mL

## 2016-11-07 MED ORDER — PANTOPRAZOLE SODIUM 40 MG IV SOLR
40.0000 mg | Freq: Every day | INTRAVENOUS | Status: DC
  Administered 2016-11-07 – 2016-11-08 (×2): 40 mg via INTRAVENOUS
  Filled 2016-11-07 (×3): qty 40

## 2016-11-07 MED ORDER — MIDAZOLAM HCL 2 MG/2ML IJ SOLN
1.0000 mg | INTRAMUSCULAR | Status: AC | PRN
  Administered 2016-11-07 – 2016-11-09 (×3): 1 mg via INTRAVENOUS
  Filled 2016-11-07 (×3): qty 2

## 2016-11-07 NOTE — Progress Notes (Signed)
This RT attempted x 2 to obtain ABG in LR, x1 in RR, a 2nd RT attempted x2 in LB all with no success in obtaining ABG. RN notified. RT will continue to monitor.

## 2016-11-07 NOTE — Plan of Care (Signed)
Problem: Fluid Volume: Goal: Hemodynamic stability will improve Outcome: Progressing BP stable, low overnight after morphine given, IVF discontinued yesterday.   Problem: Physical Regulation: Goal: Signs and symptoms of infection will decrease Outcome: Not Progressing Significant shortness of breath overnight, did not tolerate off BiPAP.  Problem: Respiratory: Goal: Ability to maintain adequate ventilation will improve Outcome: Not Progressing Lung sounds worsening on right side, MD notified, increased shortness of breath and anxiety, unable to tolerate NRB, placed back on BiPAP.

## 2016-11-07 NOTE — Progress Notes (Signed)
[facility] Progress Note Patient Name: Diana Gardner DOB: Apr 03, 1951 MRN: [id number]   Date of Service  11/07/2016  HPI/Events of Note  Respiratory Distress - Patient on BiPAP with rhonchi on physical exam. Sat = 95%. However, RR intermittently increased into the low 30's. She looks wean on video assessment and not likely to be mobilizing pulmonary secretions well.   eICU Interventions  Will order: 1. NT suction PRN. 2. Will ask PCCM APP to evaluate at bedside.      Intervention Category Intermediate Interventions: Respiratory distress - evaluation and management  , [place] 11/07/2016, 2:04 AM

## 2016-11-07 NOTE — Progress Notes (Signed)
Patient on bipap O2 sats 74-84%. Patient being transferred to [facility] ICU for possible intubation per Richardson Landry NP for critical care. Report called to receiving nurse. All questions answered. Daughter at bedside and updated.

## 2016-11-07 NOTE — Progress Notes (Signed)
Name: Diana Gardner MRN: [id number] DOB: [birth year]    ADMISSION DATE:  11/05/2016 CONSULTATION DATE:  11/06/2016  REFERRING MD :  [facility] [place]  CHIEF COMPLAINT:  Pneumonia, COPD and pleural effusion  BRIEF PATIENT DESCRIPTION: 66 year old never smoker with COPD due to second hand smoke exposure who presents to the hospital with the fever and productive cough.  Found to have an infiltrate on CXR, determined to be CAP.  There was a concern for pleural effusion and patient PCCM was consulted for empyema.  Patient has base line COPD and is 2-3 liter O2 dependent at home on combivent and albuterol.  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  Found to have an infiltrate on CXR, determined to be CAP.  There was a concern for pleural effusion and patient PCCM was consulted for empyema.  Patient has base line COPD and is 2-3 liter O2 dependent at home on combivent and albuterol.   SUBJECTIVE:  Very difficult to arouse 3/20. Cannot come off bipap. Will need to go to ICU. Sedated during the night with mso4/xanax for agitation.  VITAL SIGNS: Temp:  [96.4 F (35.8 C)-97.6 F (36.4 C)] 96.4 F (35.8 C) (03/20 0402) Pulse Rate:  [57-66] 66 (03/20 0856) Resp:  [14-25] 21 (03/20 0856) BP: (81-114)/(37-87) 107/53 (03/20 0823) SpO2:  [78 %-100 %] 88 % (03/20 0856) FiO2 (%):  [60 %-100 %] 60 % (03/20 0856) Weight:  [65 kg (143 lb 4.8 oz)] 65 kg (143 lb 4.8 oz) (03/20 0406)  PHYSICAL EXAMINATION: Gen: Very difficult to arouse. Unable to come off nimvs HEENT:  [place]/AT, PERRL, EOM-I and MMM Cardiovascular:  RRR, Nl S1/S2, -M/R/G Lungs:  Decreased BS bases Abdomen:  Soft, NT, ND and +BS Musculoskeletal:  -edema and -tenderness Skin:  Intact   Recent Labs Lab  11/05/16 2100 11/06/16 0349 11/07/16 0240  NA 131* 133* 133*  K 5.3* 4.4 4.8  CL 93* 98* 97*  CO2 24 23 20*  BUN 120* 109* 125*  CREATININE 3.70* 3.23* 3.09*  GLUCOSE 146* 83 86    Recent Labs Lab 11/05/16 1941 11/06/16 0349 11/07/16 0240  HGB 11.6* 10.3* 12.3  HCT 32.8* 30.9* 37.2  WBC 24.5* 20.2* 23.5*  PLT 404* 242 333   Dg Chest 2 View  Result Date: 11/05/2016 CLINICAL DATA:  Shortness of breath and cough. EXAM: CHEST  2 VIEW COMPARISON:  August 28, 2015 FINDINGS: The cardiomediastinal silhouette is stable. No pneumothorax. The left lung is clear. New effusion and underlying opacity seen on the right. IMPRESSION: New effusion and underlying opacity seen in the right lower lobe. No other interval changes. Recommend follow-up to resolution. Electronically Signed   By: Dorise Bullion III M.D   On: 11/05/2016 19:03   US Renal  Result Date: 11/06/2016 CLINICAL DATA:  Sepsis EXAM: RENAL / URINARY TRACT ULTRASOUND COMPLETE COMPARISON:  None. FINDINGS: Right Kidney: Length: 9.4 cm, within normal limits. Echogenicity within normal limits. No mass or hydronephrosis visualized. Left Kidney: Length: 10.9 cm, within normal limits. Echogenicity within normal limits. No mass or hydronephrosis visualized. Bladder: Appears normal for degree of bladder distention. IMPRESSION: Negative bilateral renal ultrasound. Electronically Signed   By: San Morelle M.D.   On: 11/06/2016 12:41   Dg Chest  Port 1 View  Result Date: 11/07/2016 CLINICAL DATA:  Pleural effusion EXAM: PORTABLE CHEST 1 VIEW COMPARISON:  Chest radiograph 11/05/2016 FINDINGS: Unchanged cardiomediastinal contours. Small right pleural effusion and associated basilar consolidation is unchanged. The left lung is clear. IMPRESSION: Unchanged small right pleural effusion and right basilar consolidation. Electronically Signed   By: Ulyses Jarred M.D.   On: 11/07/2016 02:43   Ir US Chest  Result Date: 11/06/2016 CLINICAL DATA:   Patient admitted with right-sided pneumonia. Recent imaging suggested possible right-sided pleural effusion. Request is made for thoracentesis. EXAM: CHEST ULTRASOUND COMPARISON:  None. FINDINGS: Minimal right-sided effusion. Most of what is seen is consolidation of the right lung secondary to pneumonia. IMPRESSION: Unable to perform thoracentesis secondary to minimal effusion. Read by: Saverio Danker, PA-C Electronically Signed   By: Aletta Edouard M.D.   On: 11/06/2016 11:57   CxR increase asdz    STUDIES:    CULTURES: Sputum 3/20 >  ANTIBIOTICS: Azithromycin 3/18 > Ceftriaxone 3/18 >  SIGNIFICANT EVENTS: 3/18 admit for CAP 3/20 transfer to ICU and intubated  LINES/TUBES: ETT 3/20 >  DISCUSSION: 66 year old female with COPD admitted 3/18 with sepsis secondary to pneumonia. She began to suffer respiratory failure and was started on BiPAP ultimately failing requiring transfer to ICU 3/20 where she was intubated and and started on vasoactive medications.   ASSESSMENT / PLAN:  PULMONARY A: Acute hypoxemic respiratory failure secondary to pneumonia complicated by likely aspiration. Pleural effusion right side > eval by bedside US, too small to drain.  COPD exacerbation on admission  P:   STAT intubation and transfer to ICU Full vent support CXR for ETT placement ABG VAP bundle Continue IV steroids and scheduled nebs  CARDIOVASCULAR A:  Hypotension/shock > hypovolemia vs sepsis vs medical sedation H/o HTN  P:  Telemetry Aggressive IVF resuscitation Phenylephrine to keep MAP goal > 39mmHg or SBP > 54mmHg May need CVL if pressor demands escalate  RENAL A:   AKI secondary to hypoperfusion, NSAIDS, and ACEi. > slowly improving Hyponatremia  P:   IVF resuscitation Follow BMP Foley for strict I&O  GASTROINTESTINAL A:   No acute issues  P:   NPO OGT to LIS Protonix for SUP  HEMATOLOGIC A:   Anemia: improved  P:  Follow CBC Heparin SQ for VTE  ppx  INFECTIOUS A:   Septic shock secondary to CAP - S. Pneumoniae P:   Continue ceftriaxone DC azithromcycin Follow cultures  ENDOCRINE A:   No acute issues  P:   Follow glucose on BMP  NEUROLOGIC A:   Acute metabolic encephalopathy: sepsis and acidosis  P:   RASS goal: -1 to -2 PRN fentanyl and versed Close monitoring  FAMILY  - Updates: Family updated by RB  - Inter-disciplinary family meet or Palliative Care meeting due by: 3/25  Georgann Housekeeper, AGACNP-BC [facility] Pager [phone] or [phone]  11/07/2016 2:44 PM   Attending Note:  I have examined patient, reviewed labs, studies and notes. I have discussed the case with Jaclynn Guarneri, and I agree with the data and plans as amended above.   66 year old woman with a history of significant COPD who was admitted 3/18 with right lower lobe pneumonia and sepsis. Urinary pneumococcal antigen was positive. She developed progressive dyspnea, acute respiratory failure and was started on BiPAP. Unfortunately she has continued to worsen, has suppressed mental status, a combined respiratory and metabolic acidosis on ABG. On my evaluation she is barely arousable, is on BiPAP with  a stable respiratory pattern. She has coarse bilateral breath sounds with decreased breath sounds at the right base. She is moving to the ICU where we will intubate to stabilize her respiratory status. Continue current bronchodilators. Continue ceftriaxone to cover pneumococcus. Her pleural effusion looks too small to drain at this time. If she continues to be hypotensive we will initiate phenylephrine, Place central venous catheter. I have discussed the patient's status and our plans including our plan to initiate mechanical ventilation with her daughter  Independent critical care time is 40 minutes.   Baltazar Apo, MD, PhD 11/07/2016, 3:04 PM [facility] [phone] or if no answer  [phone]

## 2016-11-07 NOTE — Progress Notes (Addendum)
Asked to see patient for respiratory distress.   Patient complaining of lower right sided pain and is taking swallow breaths. Currently on BIPAP  Physical Exam  Constitutional: No distress.  HENT:  Head: Normocephalic.  Cardiovascular: Exam reveals no gallop and no friction rub.   No murmur heard. Pulmonary/Chest: She has no wheezes. She has no rales. She exhibits tenderness.  Diminished breath sounds to right side.   Neurological:  Sleepy, arouses easily, complains of right-sided pain   Skin: She is not diaphoretic.   Acute on Chronic Hypoxic Respiratory Distress secondary to RLL PNA with suspected right lower lobe effusion  H/O COPD  Plan -Maintain Saturation 88-92 -Continue BIPAP -Continue Duonebs and PRN albuterol  -Continue NT suctioning as needed  -Continue diuresis as tolerated  -Scheduled Chest PT  -Pulmonary Hygiene  -Repeat CXR now   -Morphine 1-2 mg PRN  Hayden Pedro, AG-ACNP [place] [facility]  Pgr: [phone]  PCCM Pgr: [phone]

## 2016-11-07 NOTE — Progress Notes (Addendum)
eLink Physician-Brief Progress Note Patient Name: Diana Gardner DOB: February 20, 1951 MRN: [id number]   Date of Service  11/07/2016  HPI/Events of Note  Refractory septic shock on max dose NE. There is some discrepancy between NIBP and A-line  eICU Interventions  Add vasopressin Change methylpred to hydrocortisone MAP goal 70 by NIBP or 65 by A-line     Intervention Category Major Interventions: Shock - evaluation and management;Sepsis - evaluation and management  [name] 11/07/2016, 8:04 PM

## 2016-11-07 NOTE — Progress Notes (Signed)
CPT not done at this time due to patient family comfortable and getting some rest.  RT will continue to monitor.

## 2016-11-07 NOTE — Procedures (Signed)
Arterial Catheter Insertion Procedure Note Diana Gardner [id number] 1951/04/23  Procedure: Insertion of Arterial Catheter  Indications: Blood pressure monitoring and Frequent blood sampling  Procedure Details Consent: Risks of procedure as well as the alternatives and risks of each were explained to the (patient/caregiver).  Consent for procedure obtained. Time Out: Verified patient identification, verified procedure, site/side was marked, verified correct patient position, special equipment/implants available, medications/allergies/relevent history reviewed, required imaging and test results available.  Performed  Maximum sterile technique was used including antiseptics, cap, gloves, gown, hand hygiene, mask and sheet. Skin prep: Chlorhexidine; local anesthetic administered 20 gauge catheter was inserted into left radial artery using the Seldinger technique.  Evaluation Blood flow good; BP tracing good. Complications: No apparent complications.  Georgann Housekeeper, AGACNP-BC [facility] Pul[facility]ology/Critical Care Pager [phone] or [phone]  11/07/2016 6:24 PM

## 2016-11-07 NOTE — Progress Notes (Signed)
PROGRESS NOTE  Diana Gardner  RSW:[id number] DOB: 07-22-1951 DOA: 11/05/2016 PCP: [name], MD Outpatient Specialists:  Subjective: Was hypoxic all night, continued to be on BiPAP with O2 sats in the low 90s. Continue current respiratory regimen with antibiotics, mucolytics and oxygen  Brief Narrative:  Diana Gardner is a 66 y.o. female with a past medical history significant for severe COPD/emphysema and recurrent pneumonias who presents with cough and fever and malaise. The patient was in her usual state of health until about 4-5 days ago when she developed typical URI symptoms, cough and fever.  She started taking ibuprofen 400 mg BID, but then over the next few days felt gradually worse and worse with malaise, nausea, weakness, cough, fever, and right sided chest/flank pain and her cough was not controlled so she came into the ER, was found to have acute respiratory failure and acute renal failure currently on BiPAP, PCCM on the case.  Assessment & Plan:   Principal Problem:   Sepsis ([place]) Active Problems:   Chronic obstructive pulmonary disease (HCC)   Acute respiratory failure with hypoxia (HCC)   Acute renal failure (ARF) (HCC)   Hyponatremia   Microcytic anemia   Hyperkalemia   Sepsis -Presented with sepsis, WBC 24.5, heart rate is 112 in presence of pneumonia. -Given IV fluids were started on broad-spectrum antibiotics. -This is likely secondary to community-acquired pneumonia. -Mild elevation of her lactic acid 2.13, procalcitonin is 9.28. -Critical care consulted  Pneumonia -Right lower lobe, CAP, unspecified organism with associated effusion, unclear if it's parapneumonic or empyema. -Has history of COPD and has some wheezing so started on steroids. -Supportive management with bronchodilators, mucolytics and oxygen as needed.  Acute respiratory failure with hypoxia -Oxygen saturation dropped to the 80 is on 2 L of oxygen, currently on 5 L. -This is  secondary to right-sided pneumonia and pleural effusion, continue oxygen supplement. -Patient is on BiPAP, PCCM on call.  Pleural effusion:  -Ultrasound thoracentesis attempted, was not successful as there is not a lot of fluids to drain. -Obtain cell counts, cultures, LDH, protein  Acute renal failure:  -Unclear etiology but suspect secondary to NSAIDs and ACE inhibitors use. -This is probably complicated the picture of dehydration, sepsis as well. -UA without acute findings, ultrasound negative for acute findings, follow urine output closely. -Received IV Lasix yesterday, creatinine improved slightly since yesterday.  Hyponatremia:  -Check urine sodium and osmolality ratios -Fluids and trend BMP  Anemia:  Unclear cause  -Check iron stores, reticulocytes  Hypokalemia:  Mild, from #3. -Fluids and trend BMP  COPD:  -Solu-Medrol daily -Albuterol when necessary    DVT prophylaxis: SQ Heparin Code Status: Full Code Family Communication:  Disposition Plan:  Diet: Diet regular Room service appropriate? Yes; Fluid consistency: Thin  Consultants:   none  Procedures:   None  Antimicrobials:   Rocephin and Zithromax  Objective: Vitals:   11/07/16 0421 11/07/16 0520 11/07/16 0823 11/07/16 0856  BP: (!) 94/54 103/87 (!) 107/53   Pulse: 66 65  66  Resp: 18 18  (!) 21  Temp:      TempSrc:   Axillary   SpO2: 95% 93%  (!) 88%  Weight:      Height:        Intake/Output Summary (Last 24 hours) at 11/07/16 1042 Last data filed at 11/06/16 2023  Gross per 24 hour  Intake               50 ml  Output  300 ml  Net             -250 ml   Filed Weights   11/05/16 2333 11/06/16 0533 11/07/16 0406  Weight: 65.3 kg (143 lb 15.4 oz) 65.4 kg (144 lb 2.9 oz) 65 kg (143 lb 4.8 oz)    Examination: General exam: Appears calm and comfortable  Respiratory system: Clear to auscultation. Respiratory effort normal. Cardiovascular system: S1 & S2 heard, RRR. No  JVD, murmurs, rubs, gallops or clicks. No pedal edema. Gastrointestinal system: Abdomen is nondistended, soft and nontender. No organomegaly or masses felt. Normal bowel sounds heard. Central nervous system: Alert and oriented. No focal neurological deficits. Extremities: Symmetric 5 x 5 power. Skin: No rashes, lesions or ulcers Psychiatry: Judgement and insight appear normal. Mood & affect appropriate.   Data Reviewed: I have personally reviewed following labs and imaging studies  CBC:  Recent Labs Lab 11/05/16 1941 11/06/16 0349 11/07/16 0240  WBC 24.5* 20.2* 23.5*  NEUTROABS 23.1*  --   --   HGB 11.6* 10.3* 12.3  HCT 32.8* 30.9* 37.2  MCV 79.4 80.7 83.2  PLT 404* 242 517   Basic Metabolic Panel:  Recent Labs Lab 11/05/16 2100 11/06/16 0349 11/07/16 0240  NA 131* 133* 133*  K 5.3* 4.4 4.8  CL 93* 98* 97*  CO2 24 23 20*  GLUCOSE 146* 83 86  BUN 120* 109* 125*  CREATININE 3.70* 3.23* 3.09*  CALCIUM 9.5 8.5* 8.2*   GFR: Estimated Creatinine Clearance: 15.3 mL/min (A) (by C-G formula based on SCr of 3.09 mg/dL (H)). Liver Function Tests:  Recent Labs Lab 11/05/16 2331  AST 45*  ALT 35  ALKPHOS 126  BILITOT 0.5  PROT 6.2*  ALBUMIN 1.5*   No results for input(s): LIPASE, AMYLASE in the last 168 hours. No results for input(s): AMMONIA in the last 168 hours. Coagulation Profile: No results for input(s): INR, PROTIME in the last 168 hours. Cardiac Enzymes: No results for input(s): CKTOTAL, CKMB, CKMBINDEX, TROPONINI in the last 168 hours. BNP (last 3 results) No results for input(s): PROBNP in the last 8760 hours. HbA1C: No results for input(s): HGBA1C in the last 72 hours. CBG: No results for input(s): GLUCAP in the last 168 hours. Lipid Profile: No results for input(s): CHOL, HDL, LDLCALC, TRIG, CHOLHDL, LDLDIRECT in the last 72 hours. Thyroid Function Tests: No results for input(s): TSH, T4TOTAL, FREET4, T3FREE, THYROIDAB in the last 72 hours. Anemia  Panel:  Recent Labs  11/06/16 0349  FERRITIN 435*  TIBC 143*  IRON 16*  RETICCTPCT 0.6   Urine analysis:    Component Value Date/Time   COLORURINE YELLOW 11/05/2016 2331   APPEARANCEUR CLOUDY (A) 11/05/2016 2331   LABSPEC 1.014 11/05/2016 2331   PHURINE 5.0 11/05/2016 2331   GLUCOSEU 50 (A) 11/05/2016 2331   HGBUR LARGE (A) 11/05/2016 2331   BILIRUBINUR NEGATIVE 11/05/2016 2331   [place] 11/05/2016 2331   PROTEINUR 30 (A) 11/05/2016 2331   NITRITE NEGATIVE 11/05/2016 2331   LEUKOCYTESUR LARGE (A) 11/05/2016 2331   Sepsis Labs: @LABRCNTIP (procalcitonin:4,lacticidven:4)  ) Recent Results (from the past 240 hour(s))  Urine culture     Status: Abnormal   Collection Time: 11/05/16 11:31 PM  Result Value Ref Range Status   Specimen Description URINE, RANDOM  Final   Special Requests NONE  Final   Culture MULTIPLE SPECIES PRESENT, SUGGEST RECOLLECTION (A)  Final   Report Status 11/07/2016 FINAL  Final  MRSA PCR Screening     Status: None  Collection Time: 11/05/16 11:31 PM  Result Value Ref Range Status   MRSA by PCR NEGATIVE NEGATIVE Final    Comment:        The GeneXpert MRSA Assay (FDA approved for NASAL specimens only), is one component of a comprehensive MRSA colonization surveillance program. It is not intended to diagnose MRSA infection nor to guide or monitor treatment for MRSA infections.      Invalid input(s): PROCALCITONIN, LACTICACIDVEN   Radiology Studies: Dg Chest 2 View  Result Date: 11/05/2016 CLINICAL DATA:  Shortness of breath and cough. EXAM: CHEST  2 VIEW COMPARISON:  August 28, 2015 FINDINGS: The cardiomediastinal silhouette is stable. No pneumothorax. The left lung is clear. New effusion and underlying opacity seen on the right. IMPRESSION: New effusion and underlying opacity seen in the right lower lobe. No other interval changes. Recommend follow-up to resolution. Electronically Signed   By: Dorise Bullion III M.D   On:  11/05/2016 19:03   US Renal  Result Date: 11/06/2016 CLINICAL DATA:  Sepsis EXAM: RENAL / URINARY TRACT ULTRASOUND COMPLETE COMPARISON:  None. FINDINGS: Right Kidney: Length: 9.4 cm, within normal limits. Echogenicity within normal limits. No mass or hydronephrosis visualized. Left Kidney: Length: 10.9 cm, within normal limits. Echogenicity within normal limits. No mass or hydronephrosis visualized. Bladder: Appears normal for degree of bladder distention. IMPRESSION: Negative bilateral renal ultrasound. Electronically Signed   By: San Morelle M.D.   On: 11/06/2016 12:41   Dg Chest Port 1 View  Result Date: 11/07/2016 CLINICAL DATA:  Pleural effusion EXAM: PORTABLE CHEST 1 VIEW COMPARISON:  Chest radiograph 11/05/2016 FINDINGS: Unchanged cardiomediastinal contours. Small right pleural effusion and associated basilar consolidation is unchanged. The left lung is clear. IMPRESSION: Unchanged small right pleural effusion and right basilar consolidation. Electronically Signed   By: Ulyses Jarred M.D.   On: 11/07/2016 02:43   Ir US Chest  Result Date: 11/06/2016 CLINICAL DATA:  Patient admitted with right-sided pneumonia. Recent imaging suggested possible right-sided pleural effusion. Request is made for thoracentesis. EXAM: CHEST ULTRASOUND COMPARISON:  None. FINDINGS: Minimal right-sided effusion. Most of what is seen is consolidation of the right lung secondary to pneumonia. IMPRESSION: Unable to perform thoracentesis secondary to minimal effusion. Read by: Saverio Danker, PA-C Electronically Signed   By: Aletta Edouard M.D.   On: 11/06/2016 11:57        Scheduled Meds: . atorvastatin  20 mg Oral Daily  . azithromycin  500 mg Oral Q24H  . cefTRIAXone (ROCEPHIN)  IV  1 g Intravenous Q24H  . furosemide  40 mg Intravenous Once  . heparin  5,000 Units Subcutaneous Q8H  . ipratropium-albuterol  3 mL Inhalation QID  . metoprolol  100 mg Oral BID  . sodium chloride flush  3 mL Intravenous  Q12H   Continuous Infusions:   LOS: 2 days    Time spent: 35 minutes    , A, MD [facility] Pager [phone]  If 7PM-7AM, please contact night-coverage [email or web address] Password [facility] 11/07/2016, 10:42 AM

## 2016-11-07 NOTE — Progress Notes (Signed)
RT NOTE:   RN paged [facility] for patient with decreased Sats to 86%, VT 150-180's on BIPAP. NTS preformed with copious, think, dark red mucus plugs. Pt has strong cough. Right lung sounds much worse throughout night. Left lung diminished.  Pt tolerated NRB throughout. Returned to BIPAP and settings adjusted to assist patient. RT will monitor closely.

## 2016-11-07 NOTE — Procedures (Signed)
Central venous catheter Insertion Procedure Note TYLOR GAMBRILL [id number] 1951-07-22  Procedure: Insertion of Central venous catheter Type: 3 port  Indications:vasoactive infusions  Procedure Details Consent: Risks of procedure as well as the alternatives and risks of each were explained to the (patient/caregiver).  Consent for procedure obtained. Time Out: Verified patient identification, verified procedure, site/side was marked, verified correct patient position, special equipment/implants available, medications/allergies/relevent history reviewed, required imaging and test results available.  Performed  Maximum sterile technique was used including antiseptics, cap, gloves, gown, hand hygiene, mask and sheet. Skin prep: Chlorhexidine; local anesthetic administered A antimicrobial bonded/coated triple lumen catheter was placed in the left internal jugular vein using the Seldinger technique. Ultrasound guidance used.Yes.   Catheter placed to 20 cm. Blood aspirated via all 3 ports and then flushed x 3. Line sutured x 2 and dressing applied.  Evaluation Blood flow good Complications: No apparent complications Patient did tolerate procedure well. Chest X-ray ordered to verify placement.  CXR: pending.  Georgann Housekeeper, AGACNP-BC [facility] Pulmonology/Critical Care Pager [phone] or [phone]  11/07/2016 3:21 PM

## 2016-11-07 NOTE — Progress Notes (Signed)
[place] Progress Note Patient Name: Diana Gardner DOB: Sep 16, 1950 MRN: [id number]   Date of Service  11/07/2016  HPI/Events of Note  Refractory shock  eICU Interventions  NP to perform bedside assessment EKG, trop I ordered Echocardiogram ordered for 03/21     Intervention Category Major Interventions: Sepsis - evaluation and management;Shock - evaluation and management  [name] 11/07/2016, 10:10 PM

## 2016-11-07 NOTE — Progress Notes (Signed)
eLink Physician-Brief Progress Note Patient Name: Diana Gardner DOB: 06-30-1951 MRN: [id number]   Date of Service  11/07/2016  HPI/Events of Note  Worsening metabolic acidosis with no respiratory compensation. Pt is not breathing over set vent rate of 30/min  eICU Interventions  Vent rate increased to 35/min NS changed to HCO3 infusion     Intervention Category Major Interventions: Acid-Base disturbance - evaluation and management  [name] 11/07/2016, 8:13 PM

## 2016-11-07 NOTE — Care Management Note (Signed)
Case Management Note  Patient Details  Name: Diana Gardner MRN: [id number] Date of Birth: 04-03-1951  Subjective/Objective:    Pt admitted with pleural effusions                Action/Plan:  Patient from home with base line COPD and is 2-3 liter O2 dependent at home on combivent and albuterol.  Pt is now ventilated - no family at bedside    Expected Discharge Date:                  Expected Discharge Plan:     In-House Referral:     Discharge planning Services  CM Consult  Post Acute Care Choice:    Choice offered to:     DME Arranged:    DME Agency:     HH Arranged:    HH Agency:     Status of Service:  In process, will continue to follow  If discussed at Long Length of Stay Meetings, dates discussed:    Additional Comments:  Maryclare Labrador, RN 11/07/2016, 2:36 PM

## 2016-11-07 NOTE — Procedures (Signed)
Intubation Procedure Note Diana Gardner [id number] 1951-06-24  Procedure: Intubation Indications: Airway protection and maintenance  Procedure Details Consent: Risks of procedure as well as the alternatives and risks of each were explained to the (patient/caregiver).  Consent for procedure obtained. Time Out: Verified patient identification, verified procedure, site/side was marked, verified correct patient position, special equipment/implants available, medications/allergies/relevent history reviewed, required imaging and test results available.  Performed  Maximum sterile technique was used including hand hygiene and mask.  MAC and 3 Glidescope   Medications 154mg fentanyl 222mversed 1056mtomidate  Grade 2 airway view  Visualized ETT tip passing through vocal cords  Positive color change on ETCo2 device    Evaluation Hemodynamic Status: BP stable throughout; O2 sats: transiently fell during during procedure Patient's Current Condition: stable Complications: No apparent complications Patient did tolerate procedure well. Chest X-ray ordered to verify placement.  CXR: pending.  PauGeorgann HousekeeperGACNP-BC [facility] Pager [phone] ([phone]/20/2018 1:32 PM

## 2016-11-08 ENCOUNTER — Inpatient Hospital Stay (HOSPITAL_COMMUNITY): Payer: Medicare Other

## 2016-11-08 DIAGNOSIS — A403 Sepsis due to Streptococcus pneumoniae: Secondary | ICD-10-CM

## 2016-11-08 DIAGNOSIS — R0600 Dyspnea, unspecified: Secondary | ICD-10-CM

## 2016-11-08 LAB — POCT I-STAT 3, ART BLOOD GAS (G3+)
ACID-BASE EXCESS: 2 mmol/L (ref 0.0–2.0)
Acid-Base Excess: 2 mmol/L (ref 0.0–2.0)
BICARBONATE: 25.7 mmol/L (ref 20.0–28.0)
BICARBONATE: 24.3 mmol/L (ref 20.0–28.0)
O2 SAT: 94 %
O2 Saturation: 100 %
PO2 ART: 163 mmHg — AB (ref 83.0–108.0)
Patient temperature: 98.3
TCO2: 27 mmol/L (ref 0–100)
TCO2: 25 mmol/L (ref 0–100)
pCO2 arterial: 36.4 mmHg (ref 32.0–48.0)
pCO2 arterial: 30.2 mmHg — ABNORMAL LOW (ref 32.0–48.0)
pH, Arterial: 7.456 — ABNORMAL HIGH (ref 7.350–7.450)
pH, Arterial: 7.513 — ABNORMAL HIGH (ref 7.350–7.450)
pO2, Arterial: 65 mmHg — ABNORMAL LOW (ref 83.0–108.0)

## 2016-11-08 LAB — GLUCOSE, CAPILLARY
GLUCOSE-CAPILLARY: 273 mg/dL — AB (ref 65–99)
GLUCOSE-CAPILLARY: 351 mg/dL — AB (ref 65–99)
GLUCOSE-CAPILLARY: 361 mg/dL — AB (ref 65–99)
GLUCOSE-CAPILLARY: 362 mg/dL — AB (ref 65–99)
GLUCOSE-CAPILLARY: 375 mg/dL — AB (ref 65–99)
GLUCOSE-CAPILLARY: 412 mg/dL — AB (ref 65–99)
GLUCOSE-CAPILLARY: 436 mg/dL — AB (ref 65–99)
Glucose-Capillary: 372 mg/dL — ABNORMAL HIGH (ref 65–99)
Glucose-Capillary: 345 mg/dL — ABNORMAL HIGH (ref 65–99)

## 2016-11-08 LAB — RESPIRATORY PANEL BY PCR
ADENOVIRUS-RVPPCR: NOT DETECTED
Bordetella pertussis: NOT DETECTED
CORONAVIRUS NL63-RVPPCR: NOT DETECTED
CORONAVIRUS OC43-RVPPCR: NOT DETECTED
Chlamydophila pneumoniae: NOT DETECTED
Coronavirus 229E: NOT DETECTED
Coronavirus HKU1: NOT DETECTED
Influenza A: NOT DETECTED
Influenza B: NOT DETECTED
MYCOPLASMA PNEUMONIAE-RVPPCR: NOT DETECTED
Metapneumovirus: NOT DETECTED
PARAINFLUENZA VIRUS 1-RVPPCR: NOT DETECTED
Parainfluenza Virus 2: NOT DETECTED
Parainfluenza Virus 3: NOT DETECTED
Parainfluenza Virus 4: NOT DETECTED
Respiratory Syncytial Virus: NOT DETECTED
Rhinovirus / Enterovirus: NOT DETECTED

## 2016-11-08 LAB — CBC
HEMATOCRIT: 30.6 % — AB (ref 36.0–46.0)
HEMOGLOBIN: 10.2 g/dL — AB (ref 12.0–15.0)
MCH: 26.7 pg (ref 26.0–34.0)
MCHC: 33.3 g/dL (ref 30.0–36.0)
MCV: 80.1 fL (ref 78.0–100.0)
Platelets: 369 10*3/uL (ref 150–400)
RBC: 3.82 MIL/uL — AB (ref 3.87–5.11)
RDW: 15.8 % — ABNORMAL HIGH (ref 11.5–15.5)
WBC: 26.7 10*3/uL — ABNORMAL HIGH (ref 4.0–10.5)

## 2016-11-08 LAB — ECHOCARDIOGRAM COMPLETE
HEIGHTINCHES: 65.5 in
Weight: 2483.26 oz

## 2016-11-08 LAB — COMPREHENSIVE METABOLIC PANEL
ALBUMIN: 1.3 g/dL — AB (ref 3.5–5.0)
ALT: 32 U/L (ref 14–54)
AST: 40 U/L (ref 15–41)
Alkaline Phosphatase: 98 U/L (ref 38–126)
Anion gap: 22 — ABNORMAL HIGH (ref 5–15)
BUN: 131 mg/dL — AB (ref 6–20)
CHLORIDE: 97 mmol/L — AB (ref 101–111)
CO2: 15 mmol/L — AB (ref 22–32)
Calcium: 6.5 mg/dL — ABNORMAL LOW (ref 8.9–10.3)
Creatinine, Ser: 3.58 mg/dL — ABNORMAL HIGH (ref 0.44–1.00)
GFR calc Af Amer: 14 mL/min — ABNORMAL LOW (ref >60)
GFR, EST NON AFRICAN AMERICAN: 12 mL/min — AB (ref >60)
Glucose, Bld: 407 mg/dL — ABNORMAL HIGH (ref 65–99)
POTASSIUM: 4.5 mmol/L (ref 3.5–5.1)
SODIUM: 134 mmol/L — AB (ref 135–145)
Total Bilirubin: 0.9 mg/dL (ref 0.3–1.2)
Total Protein: 5.8 g/dL — ABNORMAL LOW (ref 6.5–8.1)

## 2016-11-08 LAB — COOXEMETRY PANEL
CARBOXYHEMOGLOBIN: 0.8 % (ref 0.5–1.5)
Methemoglobin: 1.1 % (ref 0.0–1.5)
O2 SAT: 72.4 %
Total hemoglobin: 9.9 g/dL — ABNORMAL LOW (ref 12.0–16.0)

## 2016-11-08 LAB — TROPONIN I: Troponin I: <0.03 ng/mL (ref <0.03)

## 2016-11-08 MED ORDER — SODIUM CHLORIDE 0.9 % IV SOLN
INTRAVENOUS | Status: DC
  Administered 2016-11-08: 3.5 [IU]/h via INTRAVENOUS
  Filled 2016-11-08 (×2): qty 2.5

## 2016-11-08 MED ORDER — VITAL AF 1.2 CAL PO LIQD
1000.0000 mL | ORAL | Status: DC
  Administered 2016-11-08 – 2016-11-13 (×6): 1000 mL
  Filled 2016-11-08 (×2): qty 1000

## 2016-11-08 MED ORDER — INSULIN ASPART 100 UNIT/ML ~~LOC~~ SOLN
5.0000 [IU] | SUBCUTANEOUS | Status: DC
  Administered 2016-11-08 (×2): 5 [IU] via SUBCUTANEOUS

## 2016-11-08 MED ORDER — SODIUM CHLORIDE 0.9 % IV SOLN
3.0000 g | Freq: Two times a day (BID) | INTRAVENOUS | Status: DC
  Administered 2016-11-08 – 2016-11-09 (×3): 3 g via INTRAVENOUS
  Filled 2016-11-08 (×4): qty 3

## 2016-11-08 MED ORDER — DEXTROSE 50 % IV SOLN: 25.0000 mL | INTRAVENOUS | Status: DC | PRN

## 2016-11-08 MED ORDER — INSULIN ASPART 100 UNIT/ML ~~LOC~~ SOLN
0.0000 [IU] | SUBCUTANEOUS | Status: DC
  Administered 2016-11-08: 5 [IU] via SUBCUTANEOUS
  Administered 2016-11-08 (×2): 9 [IU] via SUBCUTANEOUS

## 2016-11-08 MED ORDER — HYDROCORTISONE NA SUCCINATE PF 100 MG IJ SOLR
50.0000 mg | Freq: Two times a day (BID) | INTRAMUSCULAR | Status: DC
  Administered 2016-11-09 – 2016-11-10 (×3): 50 mg via INTRAVENOUS
  Filled 2016-11-08 (×2): qty 1

## 2016-11-08 MED ORDER — INSULIN ASPART 100 UNIT/ML ~~LOC~~ SOLN
0.0000 [IU] | SUBCUTANEOUS | Status: DC
  Administered 2016-11-08 (×2): 20 [IU] via SUBCUTANEOUS

## 2016-11-08 MED ORDER — PRO-STAT SUGAR FREE PO LIQD
30.0000 mL | Freq: Two times a day (BID) | ORAL | Status: DC
  Administered 2016-11-08 – 2016-11-13 (×11): 30 mL
  Filled 2016-11-08 (×13): qty 30

## 2016-11-08 MED ORDER — SODIUM CHLORIDE 0.9 % IV SOLN
INTRAVENOUS | Status: DC
  Administered 2016-11-08 – 2016-11-09 (×2): via INTRAVENOUS

## 2016-11-08 NOTE — Progress Notes (Signed)
Echocardiogram 2D Echocardiogram has been performed.  Aggie Cosier 11/08/2016, 2:09 PM

## 2016-11-08 NOTE — Progress Notes (Addendum)
Initial Nutrition Assessment  DOCUMENTATION CODES:   Not applicable  INTERVENTION:    Vital AF 1.2 at 60 ml/h (1440 ml per day)  Provides 1728 kcal, 108 gm protein, 1168 ml free water daily  NUTRITION DIAGNOSIS:   Inadequate oral intake related to inability to eat as evidenced by NPO status.  GOAL:   Patient will meet greater than or equal to 90% of their needs  MONITOR:   Vent status, TF tolerance, Labs, I & O's  REASON FOR ASSESSMENT:   Consult Assessment of nutrition requirement/status (RD to order TF per discussion with CCM team)  ASSESSMENT:   66 year old female with COPD admitted 3/18 with sepsis secondary to pneumonia. She began to suffer respiratory failure and was started on BiPAP ultimately failing requiring transfer to ICU 3/20 where she was intubated and and started on vasoactive medications.   Discussed patient in ICU rounds and with RN today. Received MD Consult for TF initiation and management. Nutrition-Focused physical exam completed. Findings are no fat depletion, no muscle depletion, and no edema.  Patient is currently intubated on ventilator support MV: 15.2 L/min Temp (24hrs), Avg:97.5 F (36.4 C), Min:93 F (33.9 C), Max:99 F (37.2 C)  Labs reviewed: sodium 134 (L) CBG's: [phone] Medications reviewed and include Solu-cortef.  Diet Order:  Diet NPO time specified  Skin:  Reviewed, no issues  Last BM:  3/15  Height:   Ht Readings from Last 1 Encounters:  11/07/16 5' 5.5" (1.664 m)    Weight:   Wt Readings from Last 1 Encounters:  11/08/16 155 lb 3.3 oz (70.4 kg)    Ideal Body Weight:  58 kg  BMI:  Body mass index is 25.43 kg/m.  Estimated Nutritional Needs:   Kcal:  5189  Protein:  90-105 gm  Fluid:  1.8-2 L  EDUCATION NEEDS:   No education needs identified at this time  Molli Barrows, [place], [place], [place] Pager [phone] After Hours Pager [phone]

## 2016-11-08 NOTE — Progress Notes (Signed)
Inpatient Diabetes Program Recommendations  AACE/ADA: New Consensus Statement on Inpatient Glycemic Control (2015)  Target Ranges:  Prepandial:   less than 140 mg/dL      Peak postprandial:   less than 180 mg/dL (1-2 hours)      Critically ill patients:  140 - 180 mg/dL   Lab Results  Component Value Date   GLUCAP 351 (H) 11/08/2016    Review of Glycemic Control:  Results for Diana Gardner, Diana Gardner (MRN [id number]) as of 11/08/2016 09:39  Ref. Range 11/08/2016 01:20 11/08/2016 04:11 11/08/2016 07:24  Glucose-Capillary Latest Ref Range: 65 - 99 mg/dL 273 (H) 361 (H) 351 (H)   Inpatient Diabetes Program Recommendations:    Please consider IV insulin/ICU Glycemic control order set due to blood sugars>300 mg/dL  Thanks, Adah Perl, RN, BC-ADM Inpatient Diabetes Coordinator Pager [phone] (8a-5p)

## 2016-11-08 NOTE — Progress Notes (Signed)
Pharmacy Antibiotic Note  Diana Gardner is a 66 y.o. female admitted on 11/05/2016 now with aspiration pneumonia.  Never smoker with COPD due to second hand smoke exposure who presents to the hospital with the fever and productive cough.  Found to have an infiltrate on CXR. She began to suffer respiratory failure and was started on BiPAP ultimately failing requiring transfer to ICU 3/20 where she was intubated and started on vasoactive medications. Patient with AKI. Pharmacy has been consulted for Unasyn dosing.  Plan: Start Unasyn 3 gram IV q 12 hours Monitor clinical progress, c/s, renal function, abx plan/LOT  Height: 5' 5.5" (166.4 cm) Weight: 155 lb 3.3 oz (70.4 kg) IBW/kg (Calculated) : 58.15  Temp (24hrs), Avg:97.7 F (36.5 C), Min:93 F (33.9 C), Max:99 F (37.2 C)   Recent Labs Lab 11/05/16 1941 11/05/16 2003 11/05/16 2100 11/05/16 2336 11/06/16 0349 11/07/16 0240 11/07/16 1842 11/08/16 0430 11/08/16 0837  WBC 24.5*  --   --   --  20.2* 23.5* 30.0*  --  26.7*  CREATININE  --   --  3.70*  --  3.23* 3.09* 3.42* 3.58*  --   LATICACIDVEN  --  2.13*  --  1.2  --   --  1.7  --   --     Estimated Creatinine Clearance: 15.2 mL/min (A) (by C-G formula based on SCr of 3.58 mg/dL (H)).    No Known Allergies  Antimicrobials this admission: 3/18 vancomycin >> x 1 3/18 azithromycin >> 3/19 3/18 Rocephin >> 3/21 3/21 Unasyn >>  Microbiology results: Strep urine + 3/18 mrsa pcr: neg 3/18 urine cx: mult species 3/20 resp cx: ngtd 3/20 blood cx: sent   Thank you for allowing [place] to participate in this patients care.  Jens Som, PharmD Clinical phone: x [phone] Or call main pharmacy at: x [phone] 11/08/2016 4:29 PM

## 2016-11-08 NOTE — Progress Notes (Signed)
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  Found to have an infiltrate on CXR, determined to be CAP.  There was a concern for pleural effusion and patient PCCM was consulted for empyema.  Patient has base line COPD and is 2-3 liter O2 dependent at home on combivent and albuterol.  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  Found to have an infiltrate on CXR, determined to be CAP.  There was a concern for pleural effusion and patient PCCM was consulted for empyema.  Patient has base line COPD and is 2-3 liter O2 dependent at home on combivent and albuterol.   SUBJECTIVE:  Very difficult to arouse 3/20. Cannot come off bipap. Will need to go to ICU. Sedated during the night with mso4/xanax for agitation.  VITAL SIGNS: Temp:  [93 F (33.9 C)-99 F (37.2 C)] 98.5 F (36.9 C) (03/21 0730) Pulse Rate:  [70-113] 84 (03/21 0900) Resp:  [3-35] 35 (03/21 0900) BP: (53-146)/(27-94) 143/46 (03/21 0900) SpO2:  [73 %-100 %] 100 % (03/21 1033) Arterial Line BP: (53-161)/(36-70) 148/56 (03/21 0915) FiO2 (%):  [80 %-100 %] 80 % (03/21 1033) Weight:  [67.4 kg (148 lb 9.4 oz)-70.4 kg (155 lb 3.3 oz)] 70.4 kg (155 lb 3.3 oz) (03/21 0419)  PHYSICAL EXAMINATION: Gen: Very difficult to arouse. Unable to come off nimvs HEENT:  [place]/AT, PERRL, EOM-I and MMM Cardiovascular:  RRR, Nl S1/S2, -M/R/G Lungs:  Decreased BS bases Abdomen:  Soft, NT, ND and  +BS Musculoskeletal:  -edema and -tenderness Skin:  Intact   Recent Labs Lab 11/07/16 0240 11/07/16 1842 11/08/16 0430  NA 133* 141 134*  K 4.8 4.9 4.5  CL 97* 103 97*  CO2 20* 20* 15*  BUN 125* 129* 131*  CREATININE 3.09* 3.42* 3.58*  GLUCOSE 86 151* 407*    Recent Labs Lab 11/07/16 0240 11/07/16 1842 11/08/16 0837  HGB 12.3 11.0* 10.2*  HCT 37.2 34.3* 30.6*  WBC 23.5* 30.0* 26.7*  PLT 333 357 369   US Renal  Result Date: 11/06/2016 CLINICAL DATA:  Sepsis EXAM: RENAL / URINARY TRACT ULTRASOUND COMPLETE COMPARISON:  None. FINDINGS: Right Kidney: Length: 9.4 cm, within normal limits. Echogenicity within normal limits. No mass or hydronephrosis visualized. Left Kidney: Length: 10.9 cm, within normal limits. Echogenicity within normal limits. No mass or hydronephrosis visualized. Bladder: Appears normal for degree of bladder distention. IMPRESSION: Negative bilateral renal ultrasound. Electronically Signed   By: San Morelle M.D.   On: 11/06/2016 12:41   Portable Chest Xray  Result Date: 11/08/2016 CLINICAL DATA:  Hypoxia EXAM: PORTABLE CHEST 1 VIEW COMPARISON:  November 07, 2016 FINDINGS: Endotracheal tube tip is 1.5 cm above the carina. Nasogastric tube tip and side port are below the diaphragm. Central catheter tip is at the cavoatrial junction. No pneumothorax. There is airspace consolidation in both lower lobes, not significantly changed from 1 day prior. There are small pleural effusions bilaterally. Heart size and  pulmonary vascularity are normal. There is atherosclerotic calcification in the aorta. No adenopathy. No bone lesions. IMPRESSION: Tube and catheter positions as described without evident pneumothorax. Airspace opacity suspicious for pneumonia in both lower lobes. Small pleural effusions bilaterally. No interstitial edema appreciable. Heart size normal. There is aortic atherosclerosis. Electronically Signed   By: Lowella Grip III M.D.   On: 11/08/2016 07:41    Dg Chest Port 1 View  Result Date: 11/07/2016 CLINICAL DATA:  Central line placement. EXAM: PORTABLE CHEST 1 VIEW COMPARISON:  Earlier today at 1412 hours FINDINGS: 1527 hours. Left internal jugular line is difficult to follow centrally secondary to overlying wires and leads. Terminates at least at the level of the high right atrium. Endotracheal and nasogastric tubes are unchanged. Normal heart size. Atherosclerosis in the transverse aorta. Small left pleural effusion is unchanged. No pneumothorax. Bibasilar airspace disease is not significantly changed. Mild interstitial edema. IMPRESSION: Left internal jugular line is difficult to follow centrally secondary to overlying wires and leads. Followed to at least the level of the high right atrium. Consider retraction 2-3 cm with repeat imaging (ideally after removing EKG leads and wires). Otherwise, similar appearance of the chest with bibasilar airspace disease, left pleural fluid, and interstitial edema. Electronically Signed   By: Abigail Miyamoto M.D.   On: 11/07/2016 15:56   Dg Chest Port 1 View  Result Date: 11/07/2016 CLINICAL DATA:  Endotracheal tube EXAM: PORTABLE CHEST 1 VIEW COMPARISON:  03/09/2017 FINDINGS: Small bilateral pleural effusions. Right lower lobe airspace disease. No pneumothorax. Stable cardiomediastinal silhouette. Endotracheal to with the tip 3.5 cm above the carina. Nasogastric tube coursing below the diaphragm. IMPRESSION: 1. Small bilateral pleural effusions. Right lower lobe airspace disease concerning for pneumonia. 2. Small bilateral pleural effusions. Electronically Signed   By: Kathreen Devoid   On: 11/07/2016 14:29   Dg Chest Port 1 View  Result Date: 11/07/2016 CLINICAL DATA:  Pleural effusion EXAM: PORTABLE CHEST 1 VIEW COMPARISON:  Chest radiograph 11/05/2016 FINDINGS: Unchanged cardiomediastinal contours. Small right pleural effusion and associated basilar consolidation is unchanged. The left lung is clear. IMPRESSION:  Unchanged small right pleural effusion and right basilar consolidation. Electronically Signed   By: Ulyses Jarred M.D.   On: 11/07/2016 02:43   Dg Abd Portable 1v  Result Date: 11/07/2016 CLINICAL DATA:  Enteric tube placement EXAM: PORTABLE ABDOMEN - 1 VIEW COMPARISON:  None. FINDINGS: Enteric tube terminates in the distal body of the stomach. No disproportionately dilated small bowel loops. Mild colonic stool volume. No evidence of pneumatosis or pneumoperitoneum. Patchy bibasilar lung opacities. IMPRESSION: 1. Enteric tube terminates in the distal body of the stomach. 2. Nonobstructive bowel gas pattern. 3. Nonspecific patchy bibasilar lung opacities, correlate with chest radiograph. Electronically Signed   By: Ilona Sorrel M.D.   On: 11/07/2016 14:29   Ir US Chest  Result Date: 11/06/2016 CLINICAL DATA:  Patient admitted with right-sided pneumonia. Recent imaging suggested possible right-sided pleural effusion. Request is made for thoracentesis. EXAM: CHEST ULTRASOUND COMPARISON:  None. FINDINGS: Minimal right-sided effusion. Most of what is seen is consolidation of the right lung secondary to pneumonia. IMPRESSION: Unable to perform thoracentesis secondary to minimal effusion. Read by: Saverio Danker, PA-C Electronically Signed   By: Aletta Edouard M.D.   On: 11/06/2016 11:57       STUDIES:  3/21 2 d >>  CULTURES: Sputum 3/20 > 3/21 bc x 2>> 3/21 RVP>>  ANTIBIOTICS: Azithromycin 3/18 >off Ceftriaxone 3/18 >off 3/21 vanc>> 3/21 zoysn>>   SIGNIFICANT  EVENTS: 3/18 admit for CAP 3/20 transfer to ICU and intubated 3/21 refractory shock  LINES/TUBES: ETT 3/20 > 3/20 cvl>> 3/20 rad aline>>  DISCUSSION: 66 year old female with COPD admitted 3/18 with sepsis secondary to pneumonia. She began to suffer respiratory failure and was started on BiPAP ultimately failing requiring transfer to ICU 3/20 where she was intubated and and started on vasoactive medications.   ASSESSMENT /  PLAN:  PULMONARY A: Acute hypoxemic respiratory failure secondary to pneumonia complicated by likely aspiration. Pleural effusion right side > eval by bedside US, too small to drain.  COPD exacerbation on admission CAP ARDS  P:    intubation and transfer to ICU Full vent support,may need  ards protocol ABG follow VAP bundle Continue IV steroids and scheduled nebs ncrease peep and decrease fio2. Check abg Change to ARDS protocol 3/21  CARDIOVASCULAR A:  Hypotension/shock > hypovolemia and  sepsis  H/o HTN  P:  Tx to ICU and cvl placed Aggressive IVF resuscitation but will moderate with ARDS like pattern Levo/vaso to keep MAP goal > 66mmHg or SBP > 1mmHg Monitor cvp and fluid resuscitation. CVP 5 3/21 Check coox and 2 d to eval LV function  RENAL Lab Results  Component Value Date   CREATININE 3.58 (H) 11/08/2016   CREATININE 3.42 (H) 11/07/2016   CREATININE 3.09 (H) 11/07/2016    Recent Labs Lab 11/07/16 0240 11/07/16 1842 11/08/16 0430  NA 133* 141 134*     A:   AKI secondary to hypoperfusion, NSAIDS, and ACEi. > slowly improving Hyponatremia  P:   IVF resuscitation Follow BMP Foley for strict I&O  GASTROINTESTINAL A:   No acute issues  P:   NPO OGT to LIS Protonix for SUP TF in next 24 hours  HEMATOLOGIC  Recent Labs  11/07/16 1842 11/08/16 0837  HGB 11.0* 10.2*    A:   Anemia: improved  P:  Follow CBC Heparin SQ for VTE ppx  INFECTIOUS A:   Septic shock secondary to CAP - S. Pneumoniae P:   3/21 change V/Z to cover broader spectrum as to failure of current abx Follow cultures  ENDOCRINE CBG (last 3)   Recent Labs  11/08/16 0120 11/08/16 0411 11/08/16 0724  GLUCAP 273* 361* 351*     A:   Hyperglycemia on stress steroids  P:   SSI  NEUROLOGIC A:   Acute metabolic encephalopathy: sepsis and acidosis  P:   RASS goal: -1 to -2 PRN fentanyl and versed Close monitoring  FAMILY  - Updates: 3/21 no family at  bedside 1000 am  - Inter-disciplinary family meet or Palliative Care meeting due by: 3/25  App cct 45 min   Richardson Landry Minor ACNP Maryanna Shape PCCM Pager [phone] till 3 pm If no answer page [phone] 11/08/2016, 10:43 AM  Attending Note:  I have examined patient, reviewed labs, studies and notes. I have discussed the case with S Minor, and I agree with the data and plans as amended above. 66 year old woman with COPD, right lower lobe pneumonia. She decompensated and required intubation and mechanical ventilation on 3/20. Her pneumococcal antigen is positive. She has developed hypotension and is currently on norepinephrine and vasopressin. On my evaluation she is sedated but wakes easily to voice. She follows commands. She has a comfortable rest for a pattern. There is no wheezing on exam but she does have a basilar inspiratory crackles. No significant edema. We will expand her antibiotics to cover anaerobes given the risk of possible aspiration while  she was on BiPAP. Support her with Ventilation, pressors and wean as able. Independent critical care time is 35 minutes.   Baltazar Apo, MD, PhD 11/08/2016, 4:06 PM [facility] [phone] or if no answer [phone]

## 2016-11-08 NOTE — Progress Notes (Signed)
RT note-Recruitment maneuver performed at this time for low sp02 88%. Increased fio2 and PEEP per protocol. Continue to monitor.

## 2016-11-08 NOTE — Progress Notes (Addendum)
eLink Physician-Brief Progress Note Patient Name: Diana Gardner DOB: 1951-04-07 MRN: [id number]   Date of Service  11/08/2016  HPI/Events of Note  Severe hyperglycemia all day  eICU Interventions  SSI changed to insulin infusion Hydrocortisone dose reduced     Intervention Category Major Interventions: Hyperglycemia - active titration of insulin therapy  [name] 11/08/2016, 8:50 PM

## 2016-11-08 NOTE — Progress Notes (Signed)
[facility] Progress Note Patient Name: Diana Gardner DOB: [birth year] MRN: [id number]   Date of Service  11/08/2016  HPI/Events of Note  Hyperglycemia - Blood glucose = 273.  eICU Interventions  Will change Novolog SSI to Q 4 hour sensitive Novolog SSI.      Intervention Category Intermediate Interventions: Hyperglycemia - evaluation and treatment  , [place] 11/08/2016, 1:25 AM

## 2016-11-09 ENCOUNTER — Inpatient Hospital Stay (HOSPITAL_COMMUNITY): Payer: Medicare Other

## 2016-11-09 DIAGNOSIS — Z9689 Presence of other specified functional implants: Secondary | ICD-10-CM | POA: Diagnosis present

## 2016-11-09 DIAGNOSIS — Z978 Presence of other specified devices: Secondary | ICD-10-CM

## 2016-11-09 DIAGNOSIS — J13 Pneumonia due to Streptococcus pneumoniae: Secondary | ICD-10-CM

## 2016-11-09 DIAGNOSIS — J9311 Primary spontaneous pneumothorax: Secondary | ICD-10-CM | POA: Diagnosis present

## 2016-11-09 LAB — GLUCOSE, CAPILLARY
GLUCOSE-CAPILLARY: 114 mg/dL — AB (ref 65–99)
GLUCOSE-CAPILLARY: 146 mg/dL — AB (ref 65–99)
GLUCOSE-CAPILLARY: 147 mg/dL — AB (ref 65–99)
GLUCOSE-CAPILLARY: 147 mg/dL — AB (ref 65–99)
GLUCOSE-CAPILLARY: 148 mg/dL — AB (ref 65–99)
GLUCOSE-CAPILLARY: 166 mg/dL — AB (ref 65–99)
GLUCOSE-CAPILLARY: 168 mg/dL — AB (ref 65–99)
GLUCOSE-CAPILLARY: 168 mg/dL — AB (ref 65–99)
GLUCOSE-CAPILLARY: 168 mg/dL — AB (ref 65–99)
GLUCOSE-CAPILLARY: 172 mg/dL — AB (ref 65–99)
GLUCOSE-CAPILLARY: 199 mg/dL — AB (ref 65–99)
GLUCOSE-CAPILLARY: 218 mg/dL — AB (ref 65–99)
GLUCOSE-CAPILLARY: 332 mg/dL — AB (ref 65–99)
GLUCOSE-CAPILLARY: 392 mg/dL — AB (ref 65–99)
GLUCOSE-CAPILLARY: 397 mg/dL — AB (ref 65–99)
Glucose-Capillary: 165 mg/dL — ABNORMAL HIGH (ref 65–99)
Glucose-Capillary: 169 mg/dL — ABNORMAL HIGH (ref 65–99)
Glucose-Capillary: 174 mg/dL — ABNORMAL HIGH (ref 65–99)
Glucose-Capillary: 176 mg/dL — ABNORMAL HIGH (ref 65–99)
Glucose-Capillary: 264 mg/dL — ABNORMAL HIGH (ref 65–99)
Glucose-Capillary: 312 mg/dL — ABNORMAL HIGH (ref 65–99)
Glucose-Capillary: 330 mg/dL — ABNORMAL HIGH (ref 65–99)
Glucose-Capillary: 91 mg/dL (ref 65–99)

## 2016-11-09 LAB — BASIC METABOLIC PANEL
Anion gap: 17 — ABNORMAL HIGH (ref 5–15)
Anion gap: 12 (ref 5–15)
BUN: 120 mg/dL — ABNORMAL HIGH (ref 6–20)
BUN: 112 mg/dL — AB (ref 6–20)
CO2: 31 mmol/L (ref 22–32)
CO2: 36 mmol/L — ABNORMAL HIGH (ref 22–32)
CREATININE: 1.94 mg/dL — AB (ref 0.44–1.00)
Calcium: 6.9 mg/dL — ABNORMAL LOW (ref 8.9–10.3)
Calcium: 7.1 mg/dL — ABNORMAL LOW (ref 8.9–10.3)
Chloride: 91 mmol/L — ABNORMAL LOW (ref 101–111)
Chloride: 96 mmol/L — ABNORMAL LOW (ref 101–111)
Creatinine, Ser: 1.53 mg/dL — ABNORMAL HIGH (ref 0.44–1.00)
GFR calc Af Amer: 39 mL/min — ABNORMAL LOW (ref >60)
GFR calc non Af Amer: 26 mL/min — ABNORMAL LOW (ref >60)
GFR, EST AFRICAN AMERICAN: 30 mL/min — AB (ref >60)
GFR, EST NON AFRICAN AMERICAN: 34 mL/min — AB (ref >60)
GLUCOSE: 291 mg/dL — AB (ref 65–99)
GLUCOSE: 134 mg/dL — AB (ref 65–99)
POTASSIUM: 2.8 mmol/L — AB (ref 3.5–5.1)
Potassium: 2.5 mmol/L — CL (ref 3.5–5.1)
Sodium: 139 mmol/L (ref 135–145)
Sodium: 144 mmol/L (ref 135–145)

## 2016-11-09 LAB — BLOOD GAS, ARTERIAL
ACID-BASE EXCESS: 10.6 mmol/L — AB (ref 0.0–2.0)
BICARBONATE: 33.9 mmol/L — AB (ref 20.0–28.0)
Drawn by: 252031
FIO2: 40
LHR: 28 {breaths}/min
O2 Saturation: 93.1 %
PEEP/CPAP: 10 cmH2O
PO2 ART: 65.1 mmHg — AB (ref 83.0–108.0)
Patient temperature: 98.6
VT: 470 mL
pCO2 arterial: 39.2 mmHg (ref 32.0–48.0)
pH, Arterial: 7.546 — ABNORMAL HIGH (ref 7.350–7.450)

## 2016-11-09 LAB — CULTURE, RESPIRATORY W GRAM STAIN

## 2016-11-09 LAB — CBC
HCT: 25.5 % — ABNORMAL LOW (ref 36.0–46.0)
Hemoglobin: 8.7 g/dL — ABNORMAL LOW (ref 12.0–15.0)
MCH: 26.6 pg (ref 26.0–34.0)
MCHC: 34.1 g/dL (ref 30.0–36.0)
MCV: 78 fL (ref 78.0–100.0)
PLATELETS: 248 10*3/uL (ref 150–400)
RBC: 3.27 MIL/uL — AB (ref 3.87–5.11)
RDW: 14.5 % (ref 11.5–15.5)
WBC: 19.2 10*3/uL — ABNORMAL HIGH (ref 4.0–10.5)

## 2016-11-09 LAB — POCT I-STAT 3, ART BLOOD GAS (G3+)
ACID-BASE EXCESS: 16 mmol/L — AB (ref 0.0–2.0)
Bicarbonate: 39 mmol/L — ABNORMAL HIGH (ref 20.0–28.0)
O2 Saturation: 95 %
PO2 ART: 62 mmHg — AB (ref 83.0–108.0)
TCO2: 40 mmol/L (ref 0–100)
pCO2 arterial: 38.5 mmHg (ref 32.0–48.0)
pH, Arterial: 7.614 (ref 7.350–7.450)

## 2016-11-09 LAB — CULTURE, RESPIRATORY: CULTURE: NO GROWTH

## 2016-11-09 LAB — PROCALCITONIN: Procalcitonin: 6.83 ng/mL

## 2016-11-09 MED ORDER — DEXTROSE 10 % IV SOLN: INTRAVENOUS | Status: DC | PRN

## 2016-11-09 MED ORDER — SODIUM CHLORIDE 0.9 % IV SOLN
3.0000 g | Freq: Three times a day (TID) | INTRAVENOUS | Status: DC
  Administered 2016-11-10 – 2016-11-25 (×47): 3 g via INTRAVENOUS
  Filled 2016-11-09 (×51): qty 3

## 2016-11-09 MED ORDER — LIDOCAINE HCL (PF) 1 % IJ SOLN
INTRAMUSCULAR | Status: AC
  Administered 2016-11-09: 5 mL
  Filled 2016-11-09: qty 10

## 2016-11-09 MED ORDER — ATORVASTATIN CALCIUM 20 MG PO TABS
20.0000 mg | ORAL_TABLET | Freq: Every day | ORAL | Status: DC
  Administered 2016-11-09 – 2016-11-20 (×11): 20 mg
  Filled 2016-11-09 (×13): qty 1

## 2016-11-09 MED ORDER — INSULIN ASPART 100 UNIT/ML ~~LOC~~ SOLN
2.0000 [IU] | SUBCUTANEOUS | Status: DC
  Administered 2016-11-09: 4 [IU] via SUBCUTANEOUS

## 2016-11-09 MED ORDER — POTASSIUM CHLORIDE 20 MEQ PO PACK
40.0000 meq | PACK | ORAL | Status: AC
  Administered 2016-11-09 (×2): 40 meq
  Filled 2016-11-09 (×3): qty 2

## 2016-11-09 MED ORDER — LABETALOL HCL 5 MG/ML IV SOLN
10.0000 mg | INTRAVENOUS | Status: DC | PRN
  Administered 2016-11-09 – 2016-11-22 (×10): 10 mg via INTRAVENOUS
  Filled 2016-11-09 (×10): qty 4

## 2016-11-09 MED ORDER — FENTANYL CITRATE (PF) 100 MCG/2ML IJ SOLN
50.0000 ug | INTRAMUSCULAR | Status: DC | PRN
  Administered 2016-11-09 – 2016-11-20 (×59): 50 ug via INTRAVENOUS
  Filled 2016-11-09 (×62): qty 2

## 2016-11-09 MED ORDER — POTASSIUM CHLORIDE 2 MEQ/ML IV SOLN
30.0000 meq | Freq: Once | INTRAVENOUS | Status: AC
  Administered 2016-11-09: 30 meq via INTRAVENOUS
  Filled 2016-11-09: qty 15

## 2016-11-09 MED ORDER — INSULIN GLARGINE 100 UNIT/ML ~~LOC~~ SOLN
25.0000 [IU] | Freq: Two times a day (BID) | SUBCUTANEOUS | Status: DC
  Administered 2016-11-09 – 2016-11-14 (×11): 25 [IU] via SUBCUTANEOUS
  Filled 2016-11-09 (×13): qty 0.25

## 2016-11-09 MED ORDER — PANTOPRAZOLE SODIUM 40 MG PO PACK
40.0000 mg | PACK | Freq: Every day | ORAL | Status: DC
  Administered 2016-11-09 – 2016-11-20 (×11): 40 mg
  Filled 2016-11-09 (×13): qty 20

## 2016-11-09 NOTE — Progress Notes (Signed)
Pharmacy Antibiotic Note  Diana Gardner is a 66 y.o. female admitted on 11/05/2016 with aspiration pneumonia.  Pharmacy has been consulted for Unasyn dosing.   SCr much improved to 1.53 mL/min with increase UOP (>1.2 L on day shift today).   Plan: Increase Unasyn 3 gram IV every 8 hours for improved renal function.   Monitor renal function closely.  Height: 5' 5.5" (166.4 cm) Weight: 161 lb 2.5 oz (73.1 kg) IBW/kg (Calculated) : 58.15  Temp (24hrs), Avg:98 F (36.7 C), Min:97.4 F (36.3 C), Max:98.5 F (36.9 C)   Recent Labs Lab 11/05/16 2003  11/05/16 2336 11/06/16 0349 11/07/16 0240 11/07/16 1842 11/08/16 0430 11/08/16 0837 11/09/16 0420 11/09/16 1000  WBC  --   --   --  20.2* 23.5* 30.0*  --  26.7* 19.2*  --   CREATININE  --   < >  --  3.23* 3.09* 3.42* 3.58*  --  1.94* 1.53*  LATICACIDVEN 2.13*  --  1.2  --   --  1.7  --   --   --   --   < > = values in this interval not displayed.  Estimated Creatinine Clearance: 36.2 mL/min (A) (by C-G formula based on SCr of 1.53 mg/dL (H)).    No Known Allergies  Antimicrobials this admission: Vanc 3/18 x1 Azithro 3/18 >> 3/19 CTX 3/18 >> 3/21 Unasyn 3/21 >>  Dose adjustments this admission: 3/22 - Increased Unasyn from 3 g IV every 12 hours to 3 g IV every 8 hours  Microbiology results: Strep urine + 3/18 mrsa pcr: neg 3/18 urine cx: mult species 3/20 resp cx: ngtd 3/21 RVP: neg 3/21 blood cx:  Thank you for allowing pharmacy to be a part of this patient's care.  Sloan Leiter, PharmD, BCPS Clinical Pharmacist Clinical phone 11/09/2016 until 11 PM- [phone] After hours, please call #[phone] 11/09/2016 6:02 PM

## 2016-11-09 NOTE — Progress Notes (Signed)
RT note- rate changed post ABG per Dr. Lamonte Sakai.

## 2016-11-09 NOTE — Progress Notes (Signed)
eLink Physician-Brief Progress Note Patient Name: Diana Gardner DOB: 25-Jul-1951 MRN: [id number]   Date of Service  11/09/2016  HPI/Events of Note  Notified by bedside nurse of progressively worsening hypertension. Patient weaned off vasopressor support for shock in the last 24 hours. Currently with endotracheal tube and chest tube in place. Nurse reports patient denies any pain or discomfort. Reportedly awake and calm. Review of home medications shows the cerebral & hydrochlorothiazide in addition to Lopressor. Admitted on 3/18. Could be experiencing some rebound hypertension from hydrochlorothiazide. Currently on intermittent sedative and analgesia.   eICU Interventions  1. Continuous blood pressure monitoring via arterial line 2. Labetalol IV when necessary systolic blood pressure greater than 165      Intervention Category Major Interventions: Shock - evaluation and management;Hypertension - evaluation and management  Tera Partridge 11/09/2016, 11:23 PM

## 2016-11-09 NOTE — Progress Notes (Signed)
RT note- increased fio2 and peep due to low sp02 88%

## 2016-11-09 NOTE — Progress Notes (Signed)
CRITICAL VALUE ALERT  Critical value received:  K 2.5  Date of notification:  11/09/16  Time of notification:  0450  Critical value read back:Yes.    Nurse who received alert:  Grandville Silos RN   MD notified (1st page):  Dr. Oletta Darter  Time of first page:  0450  Time MD responded:  [phone]

## 2016-11-09 NOTE — Progress Notes (Signed)
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  Found to have an infiltrate on CXR, determined to be CAP.  There was a concern for pleural effusion and PCCM was consulted for empyema.  Patient has base line COPD and is 2-3 liter O2 dependent at home on combivent and albuterol.  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  Found to have an infiltrate on CXR, determined to be CAP.  There was a concern for pleural effusion and PCCM was consulted for empyema.  Patient has base line COPD and is 2-3 liter O2 dependent at home on combivent and albuterol.   SUBJECTIVE:  New right pneumothorax this AM.  Sats 93% on 60% FiO2 and 10 PEEP.  VITAL SIGNS: Temp:  [98.1 F (36.7 C)-99 F (37.2 C)] 98.5 F (36.9 C) (03/22 0343) Pulse Rate:  [77-124] 80 (03/22 0700) Resp:  [10-35] 28 (03/22 0700) BP: (96-173)/(37-65) 124/65 (03/22 0700) SpO2:  [88 %-100 %] 96 % (03/22 0700) Arterial Line BP: (87-190)/(38-73) 146/55 (03/22 0700) FiO2 (%):  [40 %-80 %] 40 % (03/22 0700) Weight:  [73.1 kg (161 lb 2.5 oz)] 73.1 kg (161 lb 2.5 oz) (03/22 0452)  PHYSICAL EXAMINATION: Gen: Adult female, in NAD. HEENT:  /AT, PERRL, ETT in place. Cardiovascular:  RRR, no M/R/G. Lungs:  Decreased BS bases and RUL. Abdomen:  Soft, NT, ND and +BS. Musculoskeletal:  -edema and -tenderness. Skin:  Intact.   Recent Labs Lab 11/07/16 1842 11/08/16 0430 11/09/16 0420  NA 141  134* 139  K 4.9 4.5 2.5*  CL 103 97* 91*  CO2 20* 15* 31  BUN 129* 131* 120*  CREATININE 3.42* 3.58* 1.94*  GLUCOSE 151* 407* 291*    Recent Labs Lab 11/07/16 1842 11/08/16 0837 11/09/16 0420  HGB 11.0* 10.2* 8.7*  HCT 34.3* 30.6* 25.5*  WBC 30.0* 26.7* 19.2*  PLT 357 369 248   Dg Chest Port 1 View  Result Date: 11/09/2016 CLINICAL DATA:  Respiratory failure EXAM: PORTABLE CHEST 1 VIEW COMPARISON:  11/08/2016 FINDINGS: Endotracheal tube, nasogastric catheter and left jugular central line are again seen and stable. Cardiac shadow is stable. Bibasilar infiltrates and effusions are seen slightly worse on the right than the left. New left upper lobe infiltrative changes are noted. A new right-sided pneumothorax is noted with approximately 2 cm excursion at the apex at and 1 cm excursion laterally. IMPRESSION: New right pneumothorax. Bibasilar infiltrates as well as new left upper lobe infiltrate are seen. Effusions are seen right greater than left. Critical Value/emergent results were called by telephone at the time of interpretation on 11/09/2016 at 7:04 am to [facility], the pts nurse, who verbally acknowledged these results and will contact primary physician. Electronically Signed   By: Inez Catalina M.D.   On: 11/09/2016 07:05   Portable Chest Xray  Result Date: 11/08/2016 CLINICAL DATA:  Hypoxia EXAM: PORTABLE CHEST 1 VIEW COMPARISON:  November 07, 2016 FINDINGS: Endotracheal tube tip  is 1.5 cm above the carina. Nasogastric tube tip and side port are below the diaphragm. Central catheter tip is at the cavoatrial junction. No pneumothorax. There is airspace consolidation in both lower lobes, not significantly changed from 1 day prior. There are small pleural effusions bilaterally. Heart size and pulmonary vascularity are normal. There is atherosclerotic calcification in the aorta. No adenopathy. No bone lesions. IMPRESSION: Tube and catheter positions as described without evident pneumothorax.  Airspace opacity suspicious for pneumonia in both lower lobes. Small pleural effusions bilaterally. No interstitial edema appreciable. Heart size normal. There is aortic atherosclerosis. Electronically Signed   By: Lowella Grip III M.D.   On: 11/08/2016 07:41   Dg Chest Port 1 View  Result Date: 11/07/2016 CLINICAL DATA:  Central line placement. EXAM: PORTABLE CHEST 1 VIEW COMPARISON:  Earlier today at 1412 hours FINDINGS: 1527 hours. Left internal jugular line is difficult to follow centrally secondary to overlying wires and leads. Terminates at least at the level of the high right atrium. Endotracheal and nasogastric tubes are unchanged. Normal heart size. Atherosclerosis in the transverse aorta. Small left pleural effusion is unchanged. No pneumothorax. Bibasilar airspace disease is not significantly changed. Mild interstitial edema. IMPRESSION: Left internal jugular line is difficult to follow centrally secondary to overlying wires and leads. Followed to at least the level of the high right atrium. Consider retraction 2-3 cm with repeat imaging (ideally after removing EKG leads and wires). Otherwise, similar appearance of the chest with bibasilar airspace disease, left pleural fluid, and interstitial edema. Electronically Signed   By: Abigail Miyamoto M.D.   On: 11/07/2016 15:56   Dg Chest Port 1 View  Result Date: 11/07/2016 CLINICAL DATA:  Endotracheal tube EXAM: PORTABLE CHEST 1 VIEW COMPARISON:  03/09/2017 FINDINGS: Small bilateral pleural effusions. Right lower lobe airspace disease. No pneumothorax. Stable cardiomediastinal silhouette. Endotracheal to with the tip 3.5 cm above the carina. Nasogastric tube coursing below the diaphragm. IMPRESSION: 1. Small bilateral pleural effusions. Right lower lobe airspace disease concerning for pneumonia. 2. Small bilateral pleural effusions. Electronically Signed   By: Kathreen Devoid   On: 11/07/2016 14:29   Dg Abd Portable 1v  Result Date:  11/07/2016 CLINICAL DATA:  Enteric tube placement EXAM: PORTABLE ABDOMEN - 1 VIEW COMPARISON:  None. FINDINGS: Enteric tube terminates in the distal body of the stomach. No disproportionately dilated small bowel loops. Mild colonic stool volume. No evidence of pneumatosis or pneumoperitoneum. Patchy bibasilar lung opacities. IMPRESSION: 1. Enteric tube terminates in the distal body of the stomach. 2. Nonobstructive bowel gas pattern. 3. Nonspecific patchy bibasilar lung opacities, correlate with chest radiograph. Electronically Signed   By: Ilona Sorrel M.D.   On: 11/07/2016 14:29       STUDIES:  3/21 2 d >> EF 65-70%, G1DD  CULTURES: Sputum 3/20 > 3/21 bc x 2>> 3/21 RVP>> neg  ANTIBIOTICS: Azithromycin 3/18 > 3/18 Ceftriaxone 3/18 > 3/21 Vanc 3/21 >  3/21 Unasyn 3/21 >  SIGNIFICANT EVENTS: 3/18 admit for CAP 3/20 transfer to ICU and intubated 3/21 refractory shock  LINES/TUBES: ETT 3/20 > 3/20 cvl>> 3/20 rad aline>>  DISCUSSION: 66 year old female with COPD admitted 3/18 with sepsis secondary to pneumonia. She began to suffer respiratory failure and was started on BiPAP ultimately failing requiring transfer to ICU 3/20 where she was intubated and and started on vasoactive medications.   ASSESSMENT / PLAN:  PULMONARY A: Acute hypoxemic respiratory failure secondary to pneumonia complicated by likely aspiration. New right PTX 3/22 -  likely to worsen given high PEEP. Pleural effusion right side > eval by bedside US, too small to drain.  COPD exacerbation on admission. CAP. ARDS - started on ARDS protocol 3/21. P:   Continue full vent support. Change to 100% FiO2 and 5 PEEP for now (avoid high PEEP with PTX). Place chest tube this morning and follow CXR. VAP bundle. Continue IV steroids and scheduled nebs. Follow CXR.  CARDIOVASCULAR A:  Hypotension/shock - hypovolemia and  sepsis.  Pressors turned off AM 3/22.  CVP 12, Co-ox 73. H/o HTN. P:  Levo/vaso to keep MAP  goal > 43mmHg or SBP > 72mmHg - hopefully can continue to keep off. Goal CVP 10 - 12.  RENAL A:   AKI - secondary to hypoperfusion, NSAIDS, and ACEi.  Gradually improving. Hypokalemia - s/p repletion AM 3/22. Pseudohypocalcemia - corrects to 9.06. P:   Repeat BMP this AM and again 3/23. D/c HCO3. Assess ionized calcium. Foley for strict I&O  GASTROINTESTINAL A:   Nutrition. GI prophylaxis. P:   NPO. OGT to LIS. Protonix for SUP. Continue TF's.  HEMATOLOGIC A:   Anemia. P:  Transfuse for Hgb < 7. Follow CBC. Heparin SQ for VTE ppx.  INFECTIOUS A:   Septic shock secondary to probable aspiration +/- CAP. P:   Continue current abx. Follow cultures  ENDOCRINE A:   Hyperglycemia on stress steroids. P:   Continue insulin gtt.  NEUROLOGIC A:   Acute metabolic encephalopathy: sepsis and acidosis P:   RASS goal: -1 to -2 PRN fentanyl and versed  FAMILY  - Updates: No family available 3/22.  - Inter-disciplinary family meet or Palliative Care meeting due by: 3/25  CC time: 35 min.   Montey Hora, [place] [facility] Pager: [phone]  or [phone] 11/09/2016, 7:56 AM  Attending Note:  I have examined patient, reviewed labs, studies and notes. I have discussed the case with Junius Roads, and I agree with the data and plans as amended above. 66 year old woman with history of severe COPD admitted with a right lower lobe pneumonia that caused acute respiratory failure ARDS, requiring intubation and mechanical ventilation. Cultures are still pending but urinary pneumococcal antigen was positive. She has septic shock and continues to require pressors, currently weaning. On evaluation today she is sedated but will awake to voice and follows commands. She has coarse bilateral breath sounds. Heart exam is distant, regular. No bilateral edema. Chest x-ray 3/22 was reviewed, shows a moderate right pneumothorax. We will plan to place a  right-sided chest tube, decrease her PEEP, continue current antibiotics and resp support. Wean her pressors to off if possible. Follow cx data. check TTE to eval for cardiogenic contribution to her hypotension.  Independent critical care time is 35 minutes.   Baltazar Apo, MD, PhD 11/09/2016, 1:31 PM [facility] [phone] or if no answer [phone]

## 2016-11-09 NOTE — Progress Notes (Signed)
eLink Physician-Brief Progress Note Patient Name: Diana Gardner DOB: 10/28/1950 MRN: [id number]   Date of Service  11/09/2016  HPI/Events of Note  Nursing requesting transition to stage III hyperglycemia protocol though pt above target for lantus   eICU Interventions  lantus 25 units bid and start stage III      Intervention Category Major Interventions: Hyperglycemia - active titration of insulin therapy  Christinia Gully 11/09/2016, 8:13 PM

## 2016-11-09 NOTE — Progress Notes (Signed)
[place] Progress Note Patient Name: Diana Gardner DOB: 06/12/1951 MRN: [id number]   Date of Service  11/09/2016  HPI/Events of Note  Multiple issues: K+ = 2.5 and Creatinine = 1.94 and 2. Agitation.  eICU Interventions  Will order: 1. Replace K+. 2. Repeat BMP at 10 AM.  3. Increase Fentanyl 50 mcg IV to Q 1 hour PRN.       Intervention Category Major Interventions: Electrolyte abnormality - evaluation and management Minor Interventions: Agitation / anxiety - evaluation and management  Sommer,Steven Eugene 11/09/2016, 5:02 AM

## 2016-11-09 NOTE — Procedures (Signed)
Chest Tube Insertion Procedure Note  Indications:  Clinically significant Pneumothorax  Pre-operative Diagnosis: Pneumothorax  Post-operative Diagnosis: Pneumothorax  Procedure Details  Informed consent was obtained for the procedure, including sedation.  Risks of lung perforation, hemorrhage, arrhythmia, and adverse drug reaction were discussed.   After sterile skin prep, using standard technique, a 20 French tube was placed in the right lateral 5th rib space.  Findings: Gush of air as well as some amber colored pleural fluid.  Estimated Blood Loss:  Minimal         Specimens:  None              Complications:  None; patient tolerated the procedure well.         Disposition: ICU - intubated and critically ill.         Condition: stable   Montey Hora, [place] [facility] Pager: [phone]  or [phone] 11/09/2016, 10:03 AM    Attending Attestation: I was present for the entire procedure.

## 2016-11-10 ENCOUNTER — Inpatient Hospital Stay (HOSPITAL_COMMUNITY): Payer: Medicare Other

## 2016-11-10 DIAGNOSIS — N179 Acute kidney failure, unspecified: Secondary | ICD-10-CM

## 2016-11-10 LAB — BLOOD GAS, ARTERIAL
ACID-BASE EXCESS: 15 mmol/L — AB (ref 0.0–2.0)
BICARBONATE: 39.5 mmol/L — AB (ref 20.0–28.0)
Drawn by: 290171
FIO2: 40
LHR: 12 {breaths}/min
O2 Saturation: 96.1 %
PEEP/CPAP: 8 cmH2O
Patient temperature: 98.6
VT: 470 mL
pCO2 arterial: 51.6 mmHg — ABNORMAL HIGH (ref 32.0–48.0)
pH, Arterial: 7.496 — ABNORMAL HIGH (ref 7.350–7.450)
pO2, Arterial: 81.9 mmHg — ABNORMAL LOW (ref 83.0–108.0)

## 2016-11-10 LAB — BASIC METABOLIC PANEL
ANION GAP: 11 (ref 5–15)
Anion gap: 8 (ref 5–15)
BUN: 104 mg/dL — ABNORMAL HIGH (ref 6–20)
BUN: 93 mg/dL — ABNORMAL HIGH (ref 6–20)
CALCIUM: 7.3 mg/dL — AB (ref 8.9–10.3)
CALCIUM: 7.9 mg/dL — AB (ref 8.9–10.3)
CO2: 37 mmol/L — AB (ref 22–32)
CO2: 41 mmol/L — AB (ref 22–32)
CREATININE: 0.98 mg/dL (ref 0.44–1.00)
Chloride: 101 mmol/L (ref 101–111)
Chloride: 107 mmol/L (ref 101–111)
Creatinine, Ser: 1.09 mg/dL — ABNORMAL HIGH (ref 0.44–1.00)
GFR calc Af Amer: >60 mL/min (ref >60)
GFR calc non Af Amer: 51 mL/min — ABNORMAL LOW (ref >60)
GFR calc non Af Amer: 58 mL/min — ABNORMAL LOW (ref >60)
GFR, EST AFRICAN AMERICAN: 59 mL/min — AB (ref >60)
Glucose, Bld: 268 mg/dL — ABNORMAL HIGH (ref 65–99)
Glucose, Bld: 179 mg/dL — ABNORMAL HIGH (ref 65–99)
Potassium: 2.9 mmol/L — ABNORMAL LOW (ref 3.5–5.1)
Potassium: 3.4 mmol/L — ABNORMAL LOW (ref 3.5–5.1)
Sodium: 149 mmol/L — ABNORMAL HIGH (ref 135–145)
Sodium: 156 mmol/L — ABNORMAL HIGH (ref 135–145)

## 2016-11-10 LAB — PHOSPHORUS: PHOSPHORUS: 2.6 mg/dL (ref 2.5–4.6)

## 2016-11-10 LAB — GLUCOSE, CAPILLARY
GLUCOSE-CAPILLARY: 248 mg/dL — AB (ref 65–99)
GLUCOSE-CAPILLARY: 188 mg/dL — AB (ref 65–99)
GLUCOSE-CAPILLARY: 187 mg/dL — AB (ref 65–99)
GLUCOSE-CAPILLARY: 140 mg/dL — AB (ref 65–99)
Glucose-Capillary: 232 mg/dL — ABNORMAL HIGH (ref 65–99)
Glucose-Capillary: 183 mg/dL — ABNORMAL HIGH (ref 65–99)

## 2016-11-10 LAB — CBC
HCT: 26 % — ABNORMAL LOW (ref 36.0–46.0)
HEMOGLOBIN: 8.3 g/dL — AB (ref 12.0–15.0)
MCH: 26 pg (ref 26.0–34.0)
MCHC: 31.9 g/dL (ref 30.0–36.0)
MCV: 81.5 fL (ref 78.0–100.0)
PLATELETS: 186 10*3/uL (ref 150–400)
RBC: 3.19 MIL/uL — AB (ref 3.87–5.11)
RDW: 14.8 % (ref 11.5–15.5)
WBC: 16.5 10*3/uL — ABNORMAL HIGH (ref 4.0–10.5)

## 2016-11-10 LAB — MAGNESIUM
MAGNESIUM: 2.3 mg/dL (ref 1.7–2.4)
Magnesium: 2.1 mg/dL (ref 1.7–2.4)

## 2016-11-10 LAB — CALCIUM, IONIZED: CALCIUM, IONIZED, SERUM: 3.8 mg/dL — AB (ref 4.5–5.6)

## 2016-11-10 MED ORDER — INSULIN ASPART 100 UNIT/ML ~~LOC~~ SOLN
0.0000 [IU] | SUBCUTANEOUS | Status: DC
  Administered 2016-11-10: 3 [IU] via SUBCUTANEOUS
  Administered 2016-11-10 (×2): 4 [IU] via SUBCUTANEOUS
  Administered 2016-11-10: 7 [IU] via SUBCUTANEOUS
  Administered 2016-11-10: 4 [IU] via SUBCUTANEOUS
  Administered 2016-11-10: 7 [IU] via SUBCUTANEOUS
  Administered 2016-11-11: 4 [IU] via SUBCUTANEOUS
  Administered 2016-11-11 (×2): 3 [IU] via SUBCUTANEOUS
  Administered 2016-11-11: 4 [IU] via SUBCUTANEOUS
  Administered 2016-11-12 – 2016-11-13 (×6): 3 [IU] via SUBCUTANEOUS
  Administered 2016-11-13: 4 [IU] via SUBCUTANEOUS

## 2016-11-10 MED ORDER — POTASSIUM CHLORIDE 20 MEQ/15ML (10%) PO SOLN
60.0000 meq | Freq: Once | ORAL | Status: AC
  Administered 2016-11-10: 60 meq
  Filled 2016-11-10: qty 45

## 2016-11-10 MED ORDER — POTASSIUM CHLORIDE 20 MEQ/15ML (10%) PO SOLN
40.0000 meq | ORAL | Status: AC
  Administered 2016-11-10 – 2016-11-11 (×2): 40 meq
  Filled 2016-11-10 (×2): qty 30

## 2016-11-10 MED ORDER — POTASSIUM CHLORIDE 20 MEQ/15ML (10%) PO SOLN
40.0000 meq | Freq: Once | ORAL | Status: AC
  Administered 2016-11-10: 40 meq
  Filled 2016-11-10: qty 30

## 2016-11-10 MED ORDER — FUROSEMIDE 10 MG/ML IJ SOLN
60.0000 mg | Freq: Once | INTRAMUSCULAR | Status: AC
  Administered 2016-11-10: 60 mg via INTRAVENOUS
  Filled 2016-11-10: qty 6

## 2016-11-10 NOTE — Progress Notes (Signed)
[place] Progress Note Patient Name: Diana Gardner DOB: 04-06-1951 MRN: [id number]   Date of Service  11/10/2016  HPI/Events of Note  Serum potassium 2.9. Creatinine 1.09 and improving. BUN still 104 but also improving. Patient over 8 L positive. Patient is having bowel movements as well as good urine output.   eICU Interventions  KCl 40 mEq via tube 1      Intervention Category Intermediate Interventions: Electrolyte abnormality - evaluation and management  Tera Partridge 11/10/2016, 5:39 AM

## 2016-11-10 NOTE — Progress Notes (Signed)
[place] Progress Note Patient Name: Diana Gardner DOB: Apr 26, 1951 MRN: [id number]   Date of Service  11/10/2016  HPI/Events of Note  PVC's  Had low K this AM.   eICU Interventions  Check BMET, Mg.     Intervention Category Major Interventions: Other:  , 11/10/2016, 9:08 PM

## 2016-11-10 NOTE — Progress Notes (Signed)
eLink Physician-Brief Progress Note Patient Name: Diana Gardner DOB: [birth year] MRN: [id number]   Date of Service  11/10/2016  HPI/Events of Note  Frequent PVCs in the setting of hypokalemia  eICU Interventions  Potassium replaced     Intervention Category Intermediate Interventions: Electrolyte abnormality - evaluation and management  DETERDING,ELIZABETH 11/10/2016, 10:43 PM

## 2016-11-10 NOTE — Progress Notes (Signed)
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  Found to have an infiltrate on CXR, determined to be CAP.  There was a concern for pleural effusion and PCCM was consulted for empyema.  Patient has base line COPD and is 2-3 liter O2 dependent at home on combivent and albuterol.  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  Found to have an infiltrate on CXR, determined to be CAP.  There was a concern for pleural effusion and PCCM was consulted for empyema.  Patient has base line COPD and is 2-3 liter O2 dependent at home on combivent and albuterol.   SUBJECTIVE:  Hyperglycemia and HTN overnight > started on SSI, lantus, and PRN labetalol. CXR this AM with no residual PTX.   VITAL SIGNS: Temp:  [97.4 F (36.3 C)-98.3 F (36.8 C)] 98.3 F (36.8 C) (03/23 0743) Pulse Rate:  [70-127] 72 (03/23 0700) Resp:  [17-28] 20 (03/23 0700) BP: (85-152)/(53-72) 130/57 (03/23 0700) SpO2:  [88 %-100 %] 93 % (03/23 0810) Arterial Line BP: (94-180)/(41-79) 140/53 (03/23 0700) FiO2 (%):  [40 %-50 %] 40 % (03/23 0810) Weight:  [74.8 kg (164 lb 14.5 oz)] 74.8 kg (164 lb 14.5 oz) (03/23 0500)  PHYSICAL EXAMINATION: Gen: Adult female, in NAD. HEENT:  [place]/AT, PERRL, ETT in place.  R chest tube in place with no air leak. Cardiovascular:  RRR, no M/R/G. Lungs:  Decreased BS bases and RUL. Abdomen:  Soft, NT, ND and  +BS. Musculoskeletal:  -edema and -tenderness. Skin:  Intact.   Recent Labs Lab 11/09/16 0420 11/09/16 1000 11/10/16 0401  NA 139 144 149*  K 2.5* 2.8* 2.9*  CL 91* 96* 101  CO2 31 36* 37*  BUN 120* 112* 104*  CREATININE 1.94* 1.53* 1.09*  GLUCOSE 291* 134* 268*    Recent Labs Lab 11/08/16 0837 11/09/16 0420 11/10/16 0401  HGB 10.2* 8.7* 8.3*  HCT 30.6* 25.5* 26.0*  WBC 26.7* 19.2* 16.5*  PLT 369 248 186   Dg Chest Port 1 View  Result Date: 11/10/2016 CLINICAL DATA:  Respiratory failure. EXAM: PORTABLE CHEST 1 VIEW COMPARISON:  11/09/2016. FINDINGS: Endotracheal tube, NG tube, left IJ line in stable position . Right chest tube in stable position. No pneumothorax. Diffuse bilateral chest wall and neck subcutaneous emphysema. Subcutaneous emphysema has increased significantly from prior exam. IMPRESSION: 1. Lines and tubes in stable position. Right chest tube in stable position. No pneumothorax. 2. Persistent bilateral pulmonary infiltrates, right greater than left. Small right pleural effusion. No interim change. 3. Interim significant progression of diffuse bilateral chest wall and bilateral neck subcutaneous emphysema. Electronically Signed   By: Marcello Moores  Register   On: 11/10/2016 07:13   Dg Chest Port 1 View  Result Date: 11/09/2016 CLINICAL DATA:  Post right chest tube placement EXAM: PORTABLE CHEST 1 VIEW COMPARISON:  11/09/2016 FINDINGS: Cardiomediastinal silhouette is stable. Persistent bilateral basilar infiltrates right greater  than left. Small right pleural effusion. There is right chest tube in place with tip in right apex. No definite pneumothorax. Subcutaneous emphysema noted right axilla. Stable endotracheal and NG tube position. Stable left IJ central line position. IMPRESSION: Stable support apparatus. Persistent bilateral basilar infiltrates right greater than left. Small right pleural effusion. There is right chest tube in place with tip in right apex. No definite  pneumothorax. Subcutaneous emphysema noted right axilla. Electronically Signed   By: Lahoma Crocker M.D.   On: 11/09/2016 10:27   Dg Chest Port 1 View  Result Date: 11/09/2016 CLINICAL DATA:  Respiratory failure EXAM: PORTABLE CHEST 1 VIEW COMPARISON:  11/08/2016 FINDINGS: Endotracheal tube, nasogastric catheter and left jugular central line are again seen and stable. Cardiac shadow is stable. Bibasilar infiltrates and effusions are seen slightly worse on the right than the left. New left upper lobe infiltrative changes are noted. A new right-sided pneumothorax is noted with approximately 2 cm excursion at the apex at and 1 cm excursion laterally. IMPRESSION: New right pneumothorax. Bibasilar infiltrates as well as new left upper lobe infiltrate are seen. Effusions are seen right greater than left. Critical Value/emergent results were called by telephone at the time of interpretation on 11/09/2016 at 7:04 am to [facility], the pts nurse, who verbally acknowledged these results and will contact primary physician. Electronically Signed   By: Inez Catalina M.D.   On: 11/09/2016 07:05    STUDIES:  3/21 2 d >> EF 65-70%, G1DD  CULTURES: Sputum 3/20 > neg 3/21 bc x 2>> 3/21 RVP>> neg  ANTIBIOTICS: Azithromycin 3/18 > 3/18 Ceftriaxone 3/18 > 3/21 Vanc 3/21 >  3/21 Unasyn 3/21 >  SIGNIFICANT EVENTS: 3/18 admit for CAP 3/20 transfer to ICU and intubated 3/21 refractory shock  LINES/TUBES: ETT 3/20 > 3/20 cvl>> 3/20 rad aline>>  DISCUSSION: 66 year old female with COPD admitted 3/18 with sepsis secondary to pneumonia. She began to suffer respiratory failure and was started on BiPAP ultimately failing requiring transfer to ICU 3/20 where she was intubated and and started on vasoactive medications.   ASSESSMENT / PLAN:  PULMONARY A: Acute hypoxemic respiratory failure secondary to pneumonia complicated by likely aspiration. New right PTX 3/22 - likely to worsen given high PEEP. Pleural effusion  right side > eval by bedside US, too small to drain.  COPD exacerbation on admission. CAP. ARDS - started on ARDS protocol 3/21. P:   Continue full vent support, wean as able. Place chest tube to water seal and repeat CXR in 6 hours. VAP bundle. Continue IV steroids and scheduled nebs. Follow CXR.  CARDIOVASCULAR A:  Hypotension/shock - hypovolemia and  sepsis.  Pressors turned off AM 3/22 and remain off. H/o HTN - now back on hypertensive side 3/23. P:  Continue PRN labetalol. Follow CVP's. Wean down stress steroids to once daily then wean to off.  RENAL A:   AKI - secondary to hypoperfusion, NSAIDS, and ACEi.  Gradually improving. Hypokalemia - s/p repletion AM 3/23. Volume overload - +7.6 L since admit. P:   Correct electrolytes as indicated. 60mg  lasix x 1 (with extra 73mEq K). Foley for strict I&O  GASTROINTESTINAL A:   Nutrition. GI prophylaxis. P:   NPO. Protonix for SUP. Continue TF's.  HEMATOLOGIC A:   Anemia. VTE prophylaxis. P:  Transfuse for Hgb < 7. SCD's / heparin. CBC in AM.  INFECTIOUS A:   Septic shock secondary to probable aspiration +/- CAP. P:   Continue current abx, day 3/5.  ENDOCRINE A:  Hyperglycemia on stress steroids. P:   Continue SSI + lantus. Drop stress steroids to QD then wean to off.  NEUROLOGIC A:   Acute metabolic encephalopathy: sepsis and acidosis; resolved 3/23. P:   RASS goal: -1 to -2. PRN fentanyl and versed. Daily WUA.   FAMILY  - Updates: No family available 3/22.  - Inter-disciplinary family meet or Palliative Care meeting due by: 3/25  CC time: 30 min.   Montey Hora, [place] [facility] Pager: [phone]  or [phone] 11/10/2016, 9:01 AM  Attending Note:  I have examined patient, reviewed labs, studies and notes. I have discussed the case with Junius Roads, and I agree with the data and plans as amended above. Patient is 66 years old with a history of  severe COPD and acute respiratory failure due to right lower lobe pneumonia and associated ARDS. Urinary pneumococcal antigen positive. Also with a right sided pneumothorax status post chest tube presumed due to barotrauma. Pressor needs have significantly improved. Renal function better as well. On my evaluation today she wakes to voice, follows commands, moves all extremities. She is tolerating pressure support 10 at this time. Respiratory pattern is comfortable. Few bibasilar crackles but no wheezes. Heart is regular. We will continue current antibiotics, continue to work on decreasing sedation and pushing spontaneous breathing trials. Hopefully she will be patient candidate in the next 24-48 hours. We will give Lasix empirically 1 today. Leave her chest tube in place until she is off positive pressure. Independent critical care time is 34 minutes.   Baltazar Apo, MD, PhD 11/10/2016, 12:11 PM [place] [facility] [phone] or if no answer [phone]

## 2016-11-10 NOTE — Progress Notes (Signed)
eLink Physician-Brief Progress Note Patient Name: Diana Gardner DOB: Jan 04, 1951 MRN: [id number]   Date of Service  11/10/2016  HPI/Events of Note  Defined by bedside nurse of rising serum glucose. Transitioned to Lantus 25 units subcutaneous every 12 hour & "standard scale" insulin with Accu-Cheks every 4 hours. Reviewing home medications shows no insulin. Suspect hyperglycemia is secondary to steroids and prediabetic status. Currently on no dextrose infusions.   eICU Interventions  1. Continuing Lantus 2. Continuing Accu-Cheks every 4 hours 3. Increasing to resistant sliding scale algorithm      Intervention Category Intermediate Interventions: Hyperglycemia - evaluation and treatment  Tera Partridge 11/10/2016, 3:35 AM

## 2016-11-11 ENCOUNTER — Inpatient Hospital Stay (HOSPITAL_COMMUNITY): Payer: Medicare Other

## 2016-11-11 LAB — GLUCOSE, CAPILLARY
GLUCOSE-CAPILLARY: 161 mg/dL — AB (ref 65–99)
GLUCOSE-CAPILLARY: 136 mg/dL — AB (ref 65–99)
GLUCOSE-CAPILLARY: 125 mg/dL — AB (ref 65–99)
Glucose-Capillary: 110 mg/dL — ABNORMAL HIGH (ref 65–99)
Glucose-Capillary: 114 mg/dL — ABNORMAL HIGH (ref 65–99)
Glucose-Capillary: 157 mg/dL — ABNORMAL HIGH (ref 65–99)

## 2016-11-11 LAB — CBC
HEMATOCRIT: 29.3 % — AB (ref 36.0–46.0)
HEMOGLOBIN: 8.9 g/dL — AB (ref 12.0–15.0)
MCH: 26.1 pg (ref 26.0–34.0)
MCHC: 30.4 g/dL (ref 30.0–36.0)
MCV: 85.9 fL (ref 78.0–100.0)
Platelets: 199 10*3/uL (ref 150–400)
RBC: 3.41 MIL/uL — AB (ref 3.87–5.11)
RDW: 15.4 % (ref 11.5–15.5)
WBC: 14.4 10*3/uL — AB (ref 4.0–10.5)

## 2016-11-11 LAB — MAGNESIUM: MAGNESIUM: 2.1 mg/dL (ref 1.7–2.4)

## 2016-11-11 LAB — BASIC METABOLIC PANEL
ANION GAP: 10 (ref 5–15)
BUN: 86 mg/dL — ABNORMAL HIGH (ref 6–20)
CALCIUM: 7.9 mg/dL — AB (ref 8.9–10.3)
CO2: 37 mmol/L — ABNORMAL HIGH (ref 22–32)
Chloride: 113 mmol/L — ABNORMAL HIGH (ref 101–111)
Creatinine, Ser: 0.92 mg/dL (ref 0.44–1.00)
Glucose, Bld: 177 mg/dL — ABNORMAL HIGH (ref 65–99)
POTASSIUM: 4.1 mmol/L (ref 3.5–5.1)
SODIUM: 160 mmol/L — AB (ref 135–145)

## 2016-11-11 LAB — PHOSPHORUS: PHOSPHORUS: 2.2 mg/dL — AB (ref 2.5–4.6)

## 2016-11-11 MED ORDER — FREE WATER
400.0000 mL | Freq: Four times a day (QID) | Status: DC
  Administered 2016-11-11 – 2016-11-12 (×4): 400 mL

## 2016-11-11 NOTE — Progress Notes (Signed)
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  Found to have an infiltrate on CXR, determined to be CAP.  There was a concern for pleural effusion and PCCM was consulted for empyema.  Patient has base line COPD and is 2-3 liter O2 dependent at home on combivent and albuterol.  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  Found to have an infiltrate on CXR, determined to be CAP.  There was a concern for pleural effusion and PCCM was consulted for empyema.  Patient has base line COPD and is 2-3 liter O2 dependent at home on combivent and albuterol.   SUBJECTIVE:   Tolerating pressure support 10 Remains slightly sedated No residual pneumothorax noted on chest x-ray   VITAL SIGNS: Temp:  [97.5 F (36.4 C)-99 F (37.2 C)] 98.3 F (36.8 C) (03/24 1134) Pulse Rate:  [62-115] 78 (03/24 1200) Resp:  [18-29] 24 (03/24 1200) BP: (123-183)/(58-81) 181/80 (03/24 1200) SpO2:  [96 %-100 %] 96 % (03/24 1215) Arterial Line BP: (108-199)/(38-86) 176/63 (03/24 1200) FiO2 (%):  [40 %] 40 % (03/24 1215) Weight:  [69.2 kg (152 lb 8.9 oz)] 69.2 kg (152 lb 8.9 oz) (03/24 0500)  PHYSICAL EXAMINATION: Gen: Intubated, sedated, comfortable HEENT:  ET tube in place, I exam normal, no oral lesions Cardiovascular:  Regular no murmur, no edema Lungs: Right chest tube no air leak, coarse breath sounds but no wheezing Abdomen:   Soft, benign Musculoskeletal:  No deformities Skin:  No rash   Recent Labs Lab 11/10/16 0401 11/10/16 2118 11/11/16 0347  NA 149* 156* 160*  K 2.9* 3.4* 4.1  CL 101 107 113*  CO2 37* 41* 37*  BUN 104* 93* 86*  CREATININE 1.09* 0.98 0.92  GLUCOSE 268* 179* 177*    Recent Labs Lab 11/09/16 0420 11/10/16 0401 11/11/16 0347  HGB 8.7* 8.3* 8.9*  HCT 25.5* 26.0* 29.3*  WBC 19.2* 16.5* 14.4*  PLT 248 186 199   Dg Chest Port 1 View  Result Date: 11/11/2016 CLINICAL DATA:  Continued surveillance pneumothorax. EXAM: PORTABLE CHEST 1 VIEW COMPARISON:  11/10/2016. FINDINGS: Unchanged cardiomediastinal silhouette and support apparatus. RIGHT chest tube remains in good position in the RIGHT lung apex. Visualized pneumothorax is less, although RIGHT pleural effusion is increased. BILATERAL pulmonary opacities appear worse. Subcutaneous emphysema redemonstrated. IMPRESSION: No visible pneumothorax.  Increasing RIGHT effusion. Electronically Signed   By: Staci Righter M.D.   On: 11/11/2016 07:51   Dg Chest Port 1 View  Result Date: 11/10/2016 CLINICAL DATA:  Pneumothorax.  Chest tube. EXAM: PORTABLE CHEST 1 VIEW COMPARISON:  Chest x-ray 11/10/2016. FINDINGS: Endotracheal tube, left IJ line, NG tube, right chest tube in stable position. Heart size normal. Bilateral pulmonary infiltrates. Small right pneumothorax noted on today's exam. Diffuse chest wall subcutaneous emphysema again noted. IMPRESSION: 1. Lines and tubes including right chest tube in stable position. Small right pneumothorax noted on  today's exam. Diffuse chest wall subcutaneous emphysema noted. 2. Diffuse bilateral pulmonary infiltrates. Critical Value/emergent results were called by telephone at the time of interpretation on 11/10/2016 at 3:17 pm to nurse Anderson Malta, who verbally acknowledged these results. Electronically Signed   By: Marcello Moores  Register   On: 11/10/2016 15:19   Dg Chest Port 1 View  Result Date: 11/10/2016 CLINICAL  DATA:  Respiratory failure. EXAM: PORTABLE CHEST 1 VIEW COMPARISON:  11/09/2016. FINDINGS: Endotracheal tube, NG tube, left IJ line in stable position . Right chest tube in stable position. No pneumothorax. Diffuse bilateral chest wall and neck subcutaneous emphysema. Subcutaneous emphysema has increased significantly from prior exam. IMPRESSION: 1. Lines and tubes in stable position. Right chest tube in stable position. No pneumothorax. 2. Persistent bilateral pulmonary infiltrates, right greater than left. Small right pleural effusion. No interim change. 3. Interim significant progression of diffuse bilateral chest wall and bilateral neck subcutaneous emphysema. Electronically Signed   By: Marcello Moores  Register   On: 11/10/2016 07:13    STUDIES:  3/21 2 d >> EF 65-70%, G1DD  CULTURES: Sputum 3/20 > neg 3/21 bc x 2>> 3/21 RVP>> neg  ANTIBIOTICS: Azithromycin 3/18 > 3/18 Ceftriaxone 3/18 > 3/21 Vanc 3/21 >  3/21 Unasyn 3/21 >  SIGNIFICANT EVENTS: 3/18 admit for CAP 3/20 transfer to ICU and intubated 3/21 refractory shock  LINES/TUBES: ETT 3/20 > 3/20 cvl>> 3/20 rad aline>>  DISCUSSION: 66 year old female with COPD admitted 3/18 with sepsis secondary to pneumonia. She began to suffer respiratory failure and was started on BiPAP ultimately failing requiring transfer to ICU 3/20 where she was intubated and and started on vasoactive medications.   ASSESSMENT / PLAN:  PULMONARY A: Acute hypoxemic respiratory failure secondary to pneumonia complicated by likely aspiration. New right PTX 3/22 - likely to worsen given high PEEP. Pleural effusion right side > eval by bedside US, too small to drain.  COPD exacerbation on admission. CAP. ARDS - started on ARDS protocol 3/21. P:   Continue efforts at the later weaning,  Continue chest tube to suction while she is on positive pressure VAP prevention orders Continue IV steroids and scheduled bronchodilators Follow serial chest  x-ray  CARDIOVASCULAR A:  Hypotension/shock - hypovolemia and  sepsis.  Pressors turned off AM 3/22 and remain off. H/o HTN - now back on hypertensive side 3/23. P:  Labetalol as needed Follow CVP once a day Wean steroids over the next 2-3 days  RENAL A:   AKI - secondary to hypoperfusion, NSAIDS, and ACEi.  Gradually improving. Hypokalemia - s/p repletion AM 3/23. Volume overload - +7.6 L since admit. Hypernatremia P:   Correct electrolytes as indicated. Hold diuretics for now Add free water flushes per tube  GASTROINTESTINAL A:   Nutrition. GI prophylaxis. P:   Nothing by mouth Continue Protonix Continue tube feeding  HEMATOLOGIC A:   Anemia. VTE prophylaxis. P:  Transfusion threshold hemoglobin greater than 7   DVT prophylaxis as ordered Follow CBC  INFECTIOUS A:   Septic shock secondary to probable aspiration +/- CAP. P:   Continue current antibiotics, day 4 of 7  ENDOCRINE A:   Hyperglycemia on stress steroids. P:   Continue SSI, Lantus Steroid dosing as above  NEUROLOGIC A:   Acute metabolic encephalopathy: sepsis and acidosis; resolved 3/23. P:   RASS goal: -1 Continue fentanyl and Versed as needed Daily wakeup assessment. Suspect that her hypernatremia will contribute to encephalopathy   FAMILY  - Updates: Updated at bedside 3/23  - Inter-disciplinary  family meet or Palliative Care meeting due by: 3/25  Independent critical care time is 32 minutes.   Baltazar Apo, MD, PhD 11/11/2016, 12:35 PM [facility] [phone] or if no answer [phone]

## 2016-11-12 ENCOUNTER — Inpatient Hospital Stay (HOSPITAL_COMMUNITY): Payer: Medicare Other

## 2016-11-12 LAB — GLUCOSE, CAPILLARY
GLUCOSE-CAPILLARY: 119 mg/dL — AB (ref 65–99)
Glucose-Capillary: 127 mg/dL — ABNORMAL HIGH (ref 65–99)
Glucose-Capillary: 134 mg/dL — ABNORMAL HIGH (ref 65–99)
Glucose-Capillary: 139 mg/dL — ABNORMAL HIGH (ref 65–99)
Glucose-Capillary: 91 mg/dL (ref 65–99)
Glucose-Capillary: 95 mg/dL (ref 65–99)

## 2016-11-12 LAB — PHOSPHORUS: Phosphorus: 3 mg/dL (ref 2.5–4.6)

## 2016-11-12 LAB — BASIC METABOLIC PANEL
ANION GAP: 9 (ref 5–15)
Anion gap: 7 (ref 5–15)
BUN: 60 mg/dL — ABNORMAL HIGH (ref 6–20)
BUN: 45 mg/dL — ABNORMAL HIGH (ref 6–20)
CHLORIDE: 114 mmol/L — AB (ref 101–111)
CHLORIDE: 115 mmol/L — AB (ref 101–111)
CO2: 38 mmol/L — ABNORMAL HIGH (ref 22–32)
CO2: 35 mmol/L — AB (ref 22–32)
CREATININE: 0.76 mg/dL (ref 0.44–1.00)
Calcium: 7.8 mg/dL — ABNORMAL LOW (ref 8.9–10.3)
Calcium: 7.5 mg/dL — ABNORMAL LOW (ref 8.9–10.3)
Creatinine, Ser: 0.73 mg/dL (ref 0.44–1.00)
GFR calc non Af Amer: >60 mL/min (ref >60)
GFR calc non Af Amer: >60 mL/min (ref >60)
Glucose, Bld: 105 mg/dL — ABNORMAL HIGH (ref 65–99)
Glucose, Bld: 138 mg/dL — ABNORMAL HIGH (ref 65–99)
POTASSIUM: 2.9 mmol/L — AB (ref 3.5–5.1)
POTASSIUM: 3.1 mmol/L — AB (ref 3.5–5.1)
SODIUM: 161 mmol/L — AB (ref 135–145)
Sodium: 157 mmol/L — ABNORMAL HIGH (ref 135–145)

## 2016-11-12 LAB — CBC
HEMATOCRIT: 30.7 % — AB (ref 36.0–46.0)
HEMOGLOBIN: 9.1 g/dL — AB (ref 12.0–15.0)
MCH: 26.3 pg (ref 26.0–34.0)
MCHC: 29.6 g/dL — ABNORMAL LOW (ref 30.0–36.0)
MCV: 88.7 fL (ref 78.0–100.0)
PLATELETS: 276 10*3/uL (ref 150–400)
RBC: 3.46 MIL/uL — AB (ref 3.87–5.11)
RDW: 15.7 % — ABNORMAL HIGH (ref 11.5–15.5)
WBC: 17.1 10*3/uL — AB (ref 4.0–10.5)

## 2016-11-12 LAB — MAGNESIUM: Magnesium: 2 mg/dL (ref 1.7–2.4)

## 2016-11-12 MED ORDER — POTASSIUM CHLORIDE 2 MEQ/ML IV SOLN
30.0000 meq | INTRAVENOUS | Status: AC
  Administered 2016-11-12 (×2): 30 meq via INTRAVENOUS
  Filled 2016-11-12 (×2): qty 15

## 2016-11-12 MED ORDER — FREE WATER
400.0000 mL | Status: DC
  Administered 2016-11-12 – 2016-11-14 (×12): 400 mL

## 2016-11-12 MED ORDER — POTASSIUM CHLORIDE 20 MEQ/15ML (10%) PO SOLN
40.0000 meq | Freq: Once | ORAL | Status: AC
  Administered 2016-11-12: 40 meq
  Filled 2016-11-12: qty 30

## 2016-11-12 MED ORDER — METOPROLOL TARTRATE 25 MG/10 ML ORAL SUSPENSION
100.0000 mg | Freq: Two times a day (BID) | ORAL | Status: DC
  Administered 2016-11-12 – 2016-11-20 (×13): 100 mg
  Filled 2016-11-12 (×19): qty 40

## 2016-11-12 MED ORDER — DEXTROSE 5 % IV SOLN
INTRAVENOUS | Status: DC
  Administered 2016-11-12: 10:00:00 via INTRAVENOUS
  Administered 2016-11-13 (×2): 1000 mL via INTRAVENOUS
  Administered 2016-11-15: 08:00:00 via INTRAVENOUS

## 2016-11-12 NOTE — Progress Notes (Signed)
[facility] ADULT ICU REPLACEMENT PROTOCOL FOR AM LAB REPLACEMENT ONLY  The patient does apply for the [facility] Adult ICU Electrolyte Replacment Protocol based on the criteria listed below:   1. Is GFR >/= 40 ml/min? Yes.    Patient's GFR today is >60 2. Is urine output >/= 0.5 ml/kg/hr for the last 6 hours? Yes.   Patient's UOP is 1.5 ml/kg/hr 3. Is BUN < 60 mg/dL? Yes.    Patient's BUN today is 60 4. Abnormal electrolyte(s): k 2.9 5. Ordered repletion with: protocol 6. If a panic level lab has been reported, has the CCM MD in charge been notified? Yes.  .   Physician:  Notified emd of Na Fontanelle,  A 11/12/2016 5:32 AM

## 2016-11-12 NOTE — Progress Notes (Signed)
eLink Physician-Brief Progress Note Patient Name: AKUA BLETHEN DOB: [birth year] MRN: [id number]   Date of Service  11/12/2016  HPI/Events of Note  hypokalemia  eICU Interventions  Kcl 40 meq per ft x 1     Intervention Category Major Interventions: Electrolyte abnormality - evaluation and management  Christinia Gully 11/12/2016, 9:30 PM

## 2016-11-12 NOTE — Progress Notes (Signed)
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  Found to have an infiltrate on CXR, determined to be CAP.  There was a concern for pleural effusion and PCCM was consulted for empyema.  Patient has base line COPD and is 2-3 liter O2 dependent at home on combivent and albuterol.  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  Found to have an infiltrate on CXR, determined to be CAP.  There was a concern for pleural effusion and PCCM was consulted for empyema.  Patient has base line COPD and is 2-3 liter O2 dependent at home on combivent and albuterol.   SUBJECTIVE:   More awake today Hemodynamically stable Has not yet started pressure support  VITAL SIGNS: Temp:  [97.9 F (36.6 C)-98.6 F (37 C)] 98.6 F (37 C) (03/25 0807) Pulse Rate:  [65-101] 78 (03/25 0900) Resp:  [18-29] 26 (03/25 0900) BP: (118-187)/(59-88) 139/64 (03/25 0900) SpO2:  [93 %-100 %] 99 % (03/25 0900) Arterial Line BP: (148-193)/(56-75) 148/56 (03/24 1600) FiO2 (%):  [30 %-40 %] 40 % (03/25 0812) Weight:  [69.9 kg (154 lb 1.6 oz)] 69.9 kg (154 lb 1.6 oz) (03/25 0445)  PHYSICAL EXAMINATION: Gen: Intubated, comfortable HEENT:  ET tube in place, no oral lesions Cardiovascular:  Regular, tachycardic, no edema Lungs: Distant on the left, more wheezing on the right. Right sided chest tube without an air leak Abdomen:  Soft, positive bowel  sounds Musculoskeletal:  No deformity Skin:  No rash   Recent Labs Lab 11/10/16 2118 11/11/16 0347 11/12/16 0415  NA 156* 160* 161*  K 3.4* 4.1 2.9*  CL 107 113* 114*  CO2 41* 37* 38*  BUN 93* 86* 60*  CREATININE 0.98 0.92 0.76  GLUCOSE 179* 177* 105*    Recent Labs Lab 11/10/16 0401 11/11/16 0347 11/12/16 0415  HGB 8.3* 8.9* 9.1*  HCT 26.0* 29.3* 30.7*  WBC 16.5* 14.4* 17.1*  PLT 186 199 276   Dg Chest Port 1 View  Result Date: 11/12/2016 CLINICAL DATA:  Patient with history of pneumothorax. EXAM: PORTABLE CHEST 1 VIEW COMPARISON:  Chest radiograph 11/11/2016 FINDINGS: ET tube terminates in the mid trachea. Central venous catheter tip projects over the right atrium. Monitoring leads overlie the patient. Right-sided chest tube projects over the right lung apex. Stable cardiac and mediastinal contours. Persistent heterogeneous opacities right mid lower lung. Heterogeneous opacities left lung base. Moderate right pleural effusion. No definite pneumothorax. Subcutaneous emphysema. IMPRESSION: Stable support apparatus. Persistent moderate right pleural effusion with underlying opacities. No visible right-sided pneumothorax. Electronically Signed   By: Lovey Newcomer M.D.   On: 11/12/2016 08:40   Dg Chest Port 1 View  Result Date: 11/11/2016 CLINICAL DATA:  Continued surveillance pneumothorax. EXAM: PORTABLE CHEST 1 VIEW COMPARISON:  11/10/2016. FINDINGS: Unchanged cardiomediastinal silhouette and support apparatus. RIGHT chest tube remains in good position in the RIGHT lung apex. Visualized pneumothorax is less,  although RIGHT pleural effusion is increased. BILATERAL pulmonary opacities appear worse. Subcutaneous emphysema redemonstrated. IMPRESSION: No visible pneumothorax.  Increasing RIGHT effusion. Electronically Signed   By: Staci Righter M.D.   On: 11/11/2016 07:51   Dg Chest Port 1 View  Result Date: 11/10/2016 CLINICAL DATA:  Pneumothorax.  Chest tube. EXAM: PORTABLE CHEST 1  VIEW COMPARISON:  Chest x-ray 11/10/2016. FINDINGS: Endotracheal tube, left IJ line, NG tube, right chest tube in stable position. Heart size normal. Bilateral pulmonary infiltrates. Small right pneumothorax noted on today's exam. Diffuse chest wall subcutaneous emphysema again noted. IMPRESSION: 1. Lines and tubes including right chest tube in stable position. Small right pneumothorax noted on today's exam. Diffuse chest wall subcutaneous emphysema noted. 2. Diffuse bilateral pulmonary infiltrates. Critical Value/emergent results were called by telephone at the time of interpretation on 11/10/2016 at 3:17 pm to nurse Anderson Malta, who verbally acknowledged these results. Electronically Signed   By: Marcello Moores  Register   On: 11/10/2016 15:19    STUDIES:  3/21 2 d >> EF 65-70%, G1DD  CULTURES: Sputum 3/20 > neg 3/21 bc x 2>> 3/21 RVP>> neg  ANTIBIOTICS: Azithromycin 3/18 > 3/18 Ceftriaxone 3/18 > 3/21 Vanc 3/21 >  3/21 Unasyn 3/21 >  SIGNIFICANT EVENTS: 3/18 admit for CAP 3/20 transfer to ICU and intubated 3/21 refractory shock  LINES/TUBES: ETT 3/20 > 3/20 cvl>> 3/20 rad aline>>  DISCUSSION: 66 year old female with COPD admitted 3/18 with sepsis secondary to pneumonia. She began to suffer respiratory failure and was started on BiPAP ultimately failing requiring transfer to ICU 3/20 where she was intubated and and started on vasoactive medications.   ASSESSMENT / PLAN:  PULMONARY A: Acute hypoxemic respiratory failure secondary to pneumonia complicated by likely aspiration. New right PTX 3/22 - likely to worsen given high PEEP. Pleural effusion right side > eval by bedside US, too small to drain.  COPD exacerbation on admission. CAP. ARDS - started on ARDS protocol 3/21. P:   Continue efforts at pressure support. Not clear to me that she will be an extubation candidate given her rising sodium Chest tube to suction while she is on positive pressure VAP prevention orders Continue IV  steroids, scheduled bronchodilators Follow chest x-ray  CARDIOVASCULAR A:  Hypotension/shock - hypovolemia and  sepsis.  Pressors turned off AM 3/22 and remain off. H/o HTN - now back on hypertensive side 3/23. P:  Labetalol as needed Add scheduled metoprolol 3/25 per her home regimen Follow CVP Wean steroids over next 2-3 days  RENAL A:   AKI - secondary to hypoperfusion, NSAIDS, and ACEi.  Gradually improving. Hypokalemia - s/p repletion AM 3/23. Volume overload - improving Hypernatremia P:   Correct electrolytes as indicated Hold diuresis; she is auto-diuresing Increased free water 3/25, and D5W  GASTROINTESTINAL A:   Nutrition. GI prophylaxis. P:   Nothing by mouth, continue tube feeding Continue Protonix  HEMATOLOGIC A:   Anemia. VTE prophylaxis. P:  Transfusion threshold hemoglobin greater than 7   DVT prophylaxis Follow CBC  INFECTIOUS A:   Septic shock secondary to probable aspiration +/- CAP. P:   Continue current antibiotics day 5 of 7  ENDOCRINE A:   Hyperglycemia on stress steroids. P:   Continue SSI, Lantus Steroid dosing as above  NEUROLOGIC A:   Acute metabolic encephalopathy: sepsis and acidosis; resolved 3/23. P:   RASS goal: -1 Continue fentanyl and Versed as needed Daily wakeup assessment. Suspect that her hypernatremia will be a barrier to clearing her mental status  FAMILY  - Updates: Updated at bedside 3/23  - Inter-disciplinary family meet or Palliative Care meeting due by: 3/25  Independent critical care time is 33 minutes.   Baltazar Apo, MD, PhD 11/12/2016, 9:42 AM [facility] [phone] or if no answer [phone]

## 2016-11-13 ENCOUNTER — Inpatient Hospital Stay (HOSPITAL_COMMUNITY): Payer: Medicare Other

## 2016-11-13 DIAGNOSIS — J441 Chronic obstructive pulmonary disease with (acute) exacerbation: Secondary | ICD-10-CM

## 2016-11-13 DIAGNOSIS — J869 Pyothorax without fistula: Secondary | ICD-10-CM

## 2016-11-13 LAB — BASIC METABOLIC PANEL
ANION GAP: 8 (ref 5–15)
Anion gap: 8 (ref 5–15)
BUN: 39 mg/dL — AB (ref 6–20)
BUN: 38 mg/dL — ABNORMAL HIGH (ref 6–20)
CHLORIDE: 115 mmol/L — AB (ref 101–111)
CO2: 33 mmol/L — ABNORMAL HIGH (ref 22–32)
CO2: 31 mmol/L (ref 22–32)
CREATININE: 0.69 mg/dL (ref 0.44–1.00)
Calcium: 7.6 mg/dL — ABNORMAL LOW (ref 8.9–10.3)
Calcium: 7.3 mg/dL — ABNORMAL LOW (ref 8.9–10.3)
Chloride: 111 mmol/L (ref 101–111)
Creatinine, Ser: 0.73 mg/dL (ref 0.44–1.00)
GFR calc Af Amer: >60 mL/min (ref >60)
GFR calc Af Amer: >60 mL/min (ref >60)
GFR calc non Af Amer: >60 mL/min (ref >60)
GFR calc non Af Amer: >60 mL/min (ref >60)
GLUCOSE: 143 mg/dL — AB (ref 65–99)
Glucose, Bld: 121 mg/dL — ABNORMAL HIGH (ref 65–99)
POTASSIUM: 3.2 mmol/L — AB (ref 3.5–5.1)
POTASSIUM: 4.1 mmol/L (ref 3.5–5.1)
SODIUM: 156 mmol/L — AB (ref 135–145)
Sodium: 150 mmol/L — ABNORMAL HIGH (ref 135–145)

## 2016-11-13 LAB — GLUCOSE, CAPILLARY
GLUCOSE-CAPILLARY: 134 mg/dL — AB (ref 65–99)
GLUCOSE-CAPILLARY: 87 mg/dL (ref 65–99)
GLUCOSE-CAPILLARY: 141 mg/dL — AB (ref 65–99)
Glucose-Capillary: 110 mg/dL — ABNORMAL HIGH (ref 65–99)
Glucose-Capillary: 167 mg/dL — ABNORMAL HIGH (ref 65–99)
Glucose-Capillary: 137 mg/dL — ABNORMAL HIGH (ref 65–99)

## 2016-11-13 LAB — CBC
HEMATOCRIT: 28.9 % — AB (ref 36.0–46.0)
Hemoglobin: 8.7 g/dL — ABNORMAL LOW (ref 12.0–15.0)
MCH: 26.5 pg (ref 26.0–34.0)
MCHC: 30.1 g/dL (ref 30.0–36.0)
MCV: 88.1 fL (ref 78.0–100.0)
PLATELETS: 328 10*3/uL (ref 150–400)
RBC: 3.28 MIL/uL — ABNORMAL LOW (ref 3.87–5.11)
RDW: 15.8 % — AB (ref 11.5–15.5)
WBC: 16.6 10*3/uL — AB (ref 4.0–10.5)

## 2016-11-13 LAB — CULTURE, BLOOD (ROUTINE X 2)
CULTURE: NO GROWTH
CULTURE: NO GROWTH

## 2016-11-13 MED ORDER — VITAL AF 1.2 CAL PO LIQD
1000.0000 mL | ORAL | Status: AC
  Administered 2016-11-13 (×2): 1000 mL

## 2016-11-13 MED ORDER — POTASSIUM CHLORIDE 20 MEQ/15ML (10%) PO SOLN
30.0000 meq | ORAL | Status: AC
  Administered 2016-11-13 (×2): 30 meq
  Filled 2016-11-13 (×3): qty 30

## 2016-11-13 MED ORDER — POTASSIUM CHLORIDE 20 MEQ/15ML (10%) PO SOLN
40.0000 meq | Freq: Once | ORAL | Status: AC
  Administered 2016-11-13: 40 meq
  Filled 2016-11-13: qty 30

## 2016-11-13 NOTE — Progress Notes (Signed)
[facility] Physician Progress Note and Electrolyte Replacement  Patient Name: Diana Gardner DOB: [birth year] MRN: [id number]  Date of Service  11/13/2016   HPI/Events of Note    Recent Labs Lab 11/10/16 0401 11/10/16 2118 11/11/16 0347 11/12/16 0415 11/12/16 1925 11/13/16 0430  NA 149* 156* 160* 161* 157* 156*  K 2.9* 3.4* 4.1 2.9* 3.1* 3.2*  CL 101 107 113* 114* 115* 115*  CO2 37* 41* 37* 38* 35* 33*  GLUCOSE 268* 179* 177* 105* 138* 121*  BUN 104* 93* 86* 60* 45* 39*  CREATININE 1.09* 0.98 0.92 0.76 0.73 0.69  CALCIUM 7.3* 7.9* 7.9* 7.8* 7.5* 7.6*  MG 2.3 2.1 2.1 2.0  --   --   PHOS 2.6  --  2.2* 3.0  --   --     Estimated Creatinine Clearance: 67.8 mL/min (by C-G formula based on SCr of 0.69 mg/dL).  Intake/Output      03/25 0701 - 03/26 0700   I.V. (mL/kg) 1002.5 (14.3)   Other 230   NG/GT 1380   IV Piggyback 565   Total Intake(mL/kg) 3177.5 (45.5)   Urine (mL/kg/hr) 2200 (1.3)   Stool 750 (0.4)   Chest Tube 10 (0)   Total Output 2960   Net +217.5        - I/O DETAILED x 24h    Total I/O In: 1420 [I.V.:550; Other:110; NG/GT:660; IV Piggyback:100] Out: 1300 [Urine:900; Stool:400] - I/O THIS SHIFT    ASSESSMENT Low k  eICURN Interventions  40kcl via tube   ASSESSMENT: MAJOR ELECTROLYTE      Dr. Brand Males, M.D., [facility].C.P Pulmonary and Critical Care Medicine Staff Physician [facility] Pager: [phone], If no answer or between  15:00h - 7:00h: call [phone]  11/13/2016 6:06 AM

## 2016-11-13 NOTE — Progress Notes (Signed)
Patient transported to CT without complications. Vitals stable throughout. RT will continue to monitor.

## 2016-11-13 NOTE — Progress Notes (Signed)
[facility] Progress Note Patient Name: Diana Gardner DOB: [birth year] MRN: [id number]   Date of Service  11/13/2016  HPI/Events of Note  Hypernatremia, Na dropped from 156 to 150 on D5  eICU Interventions  Decrease D5W rate to 30cc/hr     Intervention Category Intermediate Interventions: Electrolyte abnormality - evaluation and management  Simonne Maffucci 11/13/2016, 10:11 PM

## 2016-11-13 NOTE — Progress Notes (Signed)
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  Found to have an infiltrate on CXR, determined to be CAP.  There was a concern for pleural effusion and PCCM was consulted for empyema.  Patient has base line COPD and is 2-3 liter O2 dependent at home on combivent and albuterol.  SIGNIFICANT EVENTS  11/05/2016 admission for PNA 3/20 transfer to ICU and intubated 3/21 refractory shock 3/22 > right PTX > chest tube placed   STUDIES:  3/21 2 d >> EF 65-70%, G1DD  CULTURES: Sputum 3/20 > neg 3/21 bc x 2>>ng 3/21 RVP>> neg u strep ag POS  ANTIBIOTICS: Azithromycin 3/18 > 3/18 Ceftriaxone 3/18 > 3/21 Vanc 3/21 >  3/21 Unasyn 3/21 >   LINES/TUBES: ETT 3/20 > 3/20 cvl>> 3/20 rad aline>>   SUBJECTIVE:   Critically ill, intubated Afebrile Tachypneic with wean this am  VITAL SIGNS: Temp:  [98 F (36.7 C)-98.5 F (36.9 C)] 98 F (36.7 C) (03/26 0809) Pulse Rate:  [66-87] 87 (03/26 0900) Resp:  [20-34] 34 (03/26 0900) BP: (114-162)/(47-70) 142/61 (03/26 0900) SpO2:  [93 %-100 %] 99 % (03/26 0900) FiO2 (%):  [30 %-40 %] 40 % (03/26 0749) Weight:  [154 lb 1.6 oz (69.9 kg)] 154 lb 1.6 oz (69.9 kg) (03/26 0425)  PHYSICAL EXAMINATION: Gen: Intubated, mod resp distress during wean  HEENT:  ET tube in place, no oral lesions Cardiovascular:  Regular, tachycardic, no edema Lungs: Distant on the left, more wheezing on the right. Right sided chest tube -no air leak Abdomen:  Soft, positive bowel sounds Musculoskeletal:  No deformity Skin:  No rash   Recent Labs Lab 11/12/16 0415 11/12/16 1925 11/13/16 0430  NA 161* 157* 156*  K 2.9* 3.1* 3.2*  CL 114* 115* 115*  CO2 38*  35* 33*  BUN 60* 45* 39*  CREATININE 0.76 0.73 0.69  GLUCOSE 105* 138* 121*    Recent Labs Lab 11/11/16 0347 11/12/16 0415 11/13/16 0430  HGB 8.9* 9.1* 8.7*  HCT 29.3* 30.7* 28.9*  WBC 14.4* 17.1* 16.6*  PLT 199 276 328   Dg Chest Port 1 View  Result Date: 11/13/2016 CLINICAL DATA:  Pneumothorax.  Shortness of breath . EXAM: PORTABLE CHEST 1 VIEW COMPARISON:  11/12/2016. FINDINGS: Endotracheal tube, left IJ line, NG tube, right chest tube in stable position. No pneumothorax. Persistent right side pleural effusion with increase in size from prior exam. Right base atelectasis. Heart size normal. Subcutaneous emphysema has improved. IMPRESSION: 1. Lines and tubes is including right chest tube in stable position. No pneumothorax. 2.  Interim increase in right pleural effusion. Electronically Signed   By: Marcello Moores  Register   On: 11/13/2016 06:47   Dg Chest Port 1 View  Result Date: 11/12/2016 CLINICAL DATA:  Patient with history of pneumothorax. EXAM: PORTABLE CHEST 1 VIEW COMPARISON:  Chest radiograph 11/11/2016 FINDINGS: ET tube terminates in the mid trachea. Central venous catheter tip projects over the right atrium. Monitoring leads overlie the patient. Right-sided chest tube projects over the right lung apex. Stable cardiac and mediastinal contours. Persistent heterogeneous opacities right mid lower lung. Heterogeneous opacities left lung base. Moderate right pleural effusion. No definite pneumothorax. Subcutaneous emphysema. IMPRESSION: Stable  support apparatus. Persistent moderate right pleural effusion with underlying opacities. No visible right-sided pneumothorax. Electronically Signed   By: Lovey Newcomer M.D.   On: 11/12/2016 08:40     DISCUSSION: 66 year old female with COPD admitted 3/18 with sepsis secondary to pneumococcal pneumonia. Course complicated by pneumothorax, hypernatremia & now a loculated rt effusion  ASSESSMENT / PLAN:  PULMONARY A: Acute hypoxemic respiratory  failure secondary to pneumonia complicated by likely aspiration. New right PTX 3/22 - likely to worsen given high PEEP. Pleural effusion right side > eval by bedside US, too small to drain.  COPD exacerbation on admission. CAP. ARDS - started on ARDS protocol 3/21. P:   SBts as tolerated Chest tube to suction  CT chest -no contrast VAP prevention orders dc IV steroids, ct scheduled bronchodilators   CARDIOVASCULAR A:  Hypotension/shock - hypovolemia and  sepsis.  Pressors turned off AM 3/22 and remain off. H/o HTN - now back on hypertensive side 3/23. P:  Labetalol as needed Ct scheduled metoprolol 3/25 per her home regimen   RENAL A:   AKI - secondary to hypoperfusion, NSAIDS, and ACEi.  Gradually improving. Hypokalemia - s/p repletion AM 3/23. Volume overload - improving Hypernatremia P:   Correct electrolytes as indicated Hold diuresis Increased free water 3/25, ct  D5W @ 50/h  GASTROINTESTINAL A:   Nutrition. GI prophylaxis. P:   continue tube feeding Continue Protonix  HEMATOLOGIC A:   Anemia. VTE prophylaxis. P:  Transfusion threshold hemoglobin greater than 7   DVT prophylaxis Follow CBC  INFECTIOUS A:   Septic shock secondary to probable aspiration +/- CAP. ? Empyema P:   Continue current antibiotics - if CT looks like empyema, broaden to zosyn  ENDOCRINE A:   Hyperglycemia, on d5W , offsteroids. P:   Continue SSI, Lantus   NEUROLOGIC A:   Acute metabolic encephalopathy: sepsis and acidosis; resolved 3/23. P:   RASS goal: -1 Continue fentanyl and Versed as needed Daily wakeup assessment.    FAMILY  - Updates: Updated at bedside 3/23  - Inter-disciplinary family meet or Palliative Care meeting due by: 3/25  Independent critical care time is 35 minutes.     Kara Mead MD. [place] Flood. [facility] Pager [phone] If no response call [phone]    11/13/2016, 10:12 AM

## 2016-11-13 NOTE — Progress Notes (Signed)
Nutrition Follow-up  DOCUMENTATION CODES:   Not applicable  INTERVENTION:   To better meet re-estimated needs:  Decrease Vital AF 1.2 to 55 ml/h (1320 ml per day)  Provides 1584 kcal, 99 gm protein, 1071 ml free water daily  NUTRITION DIAGNOSIS:   Inadequate oral intake related to inability to eat as evidenced by NPO status.  Ongoing  GOAL:   Patient will meet greater than or equal to 90% of their needs  Met  MONITOR:   Vent status, TF tolerance, Labs, I & O's  ASSESSMENT:   66 year old female with COPD admitted 3/18 with sepsis secondary to pneumonia. She began to suffer respiratory failure and was started on BiPAP ultimately failing requiring transfer to ICU 3/20 where she was intubated and and started on vasoactive medications.   Discussed patient in ICU rounds and with RN today. Patient is tolerating TF well at goal rate without difficulty. Currently receiving Vital AF 1.2 via OGT at 60 ml/h (1440 ml/day) to provide 1728 kcals, 108 gm protein, 1168 ml free water daily.  Patient is currently intubated on ventilator support MV: 11.6 L/min Temp (24hrs), Avg:98.2 F (36.8 C), Min:97.8 F (36.6 C), Max:98.6 F (37 C)  Labs reviewed: sodium 156 (H), potassium 3.2 (L) Free water added: 400 ml every 4 hours CBG's: [phone] Medications reviewed and include Solu-cortef. IVF: D5 1/2 NS at 50 ml/h  Diet Order:  Diet NPO time specified  Skin:  Reviewed, no issues  Last BM:  3/26 (rectal tube)  Height:   Ht Readings from Last 1 Encounters:  11/07/16 5' 5.5" (1.664 m)    Weight:   Wt Readings from Last 1 Encounters:  11/13/16 154 lb 1.6 oz (69.9 kg)    Ideal Body Weight:  58 kg  BMI:  Body mass index is 25.25 kg/m.  Estimated Nutritional Needs:   Kcal:  1525  Protein:  90-105 gm  Fluid:  1.8-2 L  EDUCATION NEEDS:   No education needs identified at this time  Molli Barrows, [place], [place], [place] Pager [phone] After Hours Pager [phone]

## 2016-11-14 ENCOUNTER — Inpatient Hospital Stay (HOSPITAL_COMMUNITY): Payer: Medicare Other

## 2016-11-14 ENCOUNTER — Encounter (HOSPITAL_COMMUNITY): Payer: Self-pay | Admitting: Thoracic Surgery (Cardiothoracic Vascular Surgery)

## 2016-11-14 DIAGNOSIS — J869 Pyothorax without fistula: Secondary | ICD-10-CM

## 2016-11-14 LAB — GLUCOSE, CAPILLARY
GLUCOSE-CAPILLARY: 75 mg/dL (ref 65–99)
GLUCOSE-CAPILLARY: 154 mg/dL — AB (ref 65–99)
Glucose-Capillary: 64 mg/dL — ABNORMAL LOW (ref 65–99)
Glucose-Capillary: 74 mg/dL (ref 65–99)
Glucose-Capillary: 110 mg/dL — ABNORMAL HIGH (ref 65–99)
Glucose-Capillary: 213 mg/dL — ABNORMAL HIGH (ref 65–99)

## 2016-11-14 LAB — CBC
HCT: 25.9 % — ABNORMAL LOW (ref 36.0–46.0)
HEMATOCRIT: 25.7 % — AB (ref 36.0–46.0)
HEMOGLOBIN: 7.8 g/dL — AB (ref 12.0–15.0)
Hemoglobin: 7.9 g/dL — ABNORMAL LOW (ref 12.0–15.0)
MCH: 27 pg (ref 26.0–34.0)
MCH: 26.4 pg (ref 26.0–34.0)
MCHC: 30.5 g/dL (ref 30.0–36.0)
MCHC: 30.4 g/dL (ref 30.0–36.0)
MCV: 88.4 fL (ref 78.0–100.0)
MCV: 86.8 fL (ref 78.0–100.0)
PLATELETS: 333 10*3/uL (ref 150–400)
Platelets: 339 10*3/uL (ref 150–400)
RBC: 2.93 MIL/uL — ABNORMAL LOW (ref 3.87–5.11)
RBC: 2.96 MIL/uL — ABNORMAL LOW (ref 3.87–5.11)
RDW: 16.3 % — AB (ref 11.5–15.5)
RDW: 15.7 % — AB (ref 11.5–15.5)
WBC: 14.3 10*3/uL — AB (ref 4.0–10.5)
WBC: 12.5 10*3/uL — AB (ref 4.0–10.5)

## 2016-11-14 LAB — COMPREHENSIVE METABOLIC PANEL
ALBUMIN: 1.1 g/dL — AB (ref 3.5–5.0)
ALK PHOS: 79 U/L (ref 38–126)
ALT: 35 U/L (ref 14–54)
AST: 36 U/L (ref 15–41)
Anion gap: 7 (ref 5–15)
BILIRUBIN TOTAL: 0.5 mg/dL (ref 0.3–1.2)
BUN: 36 mg/dL — AB (ref 6–20)
CALCIUM: 7.5 mg/dL — AB (ref 8.9–10.3)
CO2: 29 mmol/L (ref 22–32)
CREATININE: 0.73 mg/dL (ref 0.44–1.00)
Chloride: 107 mmol/L (ref 101–111)
GFR calc Af Amer: >60 mL/min (ref >60)
GLUCOSE: 221 mg/dL — AB (ref 65–99)
Potassium: 3.9 mmol/L (ref 3.5–5.1)
Sodium: 143 mmol/L (ref 135–145)
TOTAL PROTEIN: 5.5 g/dL — AB (ref 6.5–8.1)

## 2016-11-14 LAB — BASIC METABOLIC PANEL
ANION GAP: 7 (ref 5–15)
BUN: 35 mg/dL — AB (ref 6–20)
CO2: 29 mmol/L (ref 22–32)
Calcium: 7.6 mg/dL — ABNORMAL LOW (ref 8.9–10.3)
Chloride: 109 mmol/L (ref 101–111)
Creatinine, Ser: 0.73 mg/dL (ref 0.44–1.00)
GFR calc Af Amer: >60 mL/min (ref >60)
GFR calc non Af Amer: >60 mL/min (ref >60)
GLUCOSE: 145 mg/dL — AB (ref 65–99)
POTASSIUM: 3.8 mmol/L (ref 3.5–5.1)
Sodium: 145 mmol/L (ref 135–145)

## 2016-11-14 LAB — PROTIME-INR
INR: 1.26
PROTHROMBIN TIME: 15.9 s — AB (ref 11.4–15.2)

## 2016-11-14 LAB — APTT: aPTT: 33 seconds (ref 24–36)

## 2016-11-14 LAB — URINALYSIS, ROUTINE W REFLEX MICROSCOPIC
BILIRUBIN URINE: NEGATIVE
GLUCOSE, UA: NEGATIVE mg/dL
HGB URINE DIPSTICK: NEGATIVE
Ketones, ur: NEGATIVE mg/dL
Leukocytes, UA: NEGATIVE
Nitrite: NEGATIVE
PH: 5 (ref 5.0–8.0)
Protein, ur: NEGATIVE mg/dL
SPECIFIC GRAVITY, URINE: 1.018 (ref 1.005–1.030)

## 2016-11-14 LAB — ABO/RH: ABO/RH(D): O POS

## 2016-11-14 MED ORDER — INSULIN ASPART 100 UNIT/ML ~~LOC~~ SOLN
0.0000 [IU] | SUBCUTANEOUS | Status: DC
  Administered 2016-11-14: 2 [IU] via SUBCUTANEOUS
  Administered 2016-11-14: 3 [IU] via SUBCUTANEOUS
  Administered 2016-11-15: 2 [IU] via SUBCUTANEOUS
  Administered 2016-11-15: 3 [IU] via SUBCUTANEOUS
  Administered 2016-11-15: 2 [IU] via SUBCUTANEOUS
  Administered 2016-11-16 (×2): 1 [IU] via SUBCUTANEOUS
  Administered 2016-11-16 – 2016-11-17 (×4): 2 [IU] via SUBCUTANEOUS
  Administered 2016-11-17 – 2016-11-18 (×6): 1 [IU] via SUBCUTANEOUS
  Administered 2016-11-19 – 2016-11-20 (×4): 2 [IU] via SUBCUTANEOUS

## 2016-11-14 MED ORDER — METHYLPREDNISOLONE SODIUM SUCC 40 MG IJ SOLR
40.0000 mg | Freq: Every day | INTRAMUSCULAR | Status: DC
  Administered 2016-11-14 – 2016-11-20 (×7): 40 mg via INTRAVENOUS
  Filled 2016-11-14 (×8): qty 1

## 2016-11-14 MED ORDER — FREE WATER
400.0000 mL | Freq: Three times a day (TID) | Status: DC
  Administered 2016-11-14 – 2016-11-16 (×4): 400 mL

## 2016-11-14 MED ORDER — FUROSEMIDE 10 MG/ML IJ SOLN
20.0000 mg | Freq: Once | INTRAMUSCULAR | Status: AC
  Administered 2016-11-14: 20 mg via INTRAVENOUS
  Filled 2016-11-14: qty 2

## 2016-11-14 NOTE — Consult Note (Addendum)
Reason for Consult:Empyema Referring Physician: CCM  Diana Gardner is an 66 y.o. female.  HPI: 66 yo woman with a history of COPD and hypertension. She is on 2-3 L home O2 for COPD ostensibly due to second hand smoke exposure. Admitted 3/18 with pneumonia after presenting with fever, cough and shortness of breath. She was started on IV antibiotics. She became septic and was transferred to ICU and intubated for worsening acute on chronic respiratory failure on 3/20. She developed a spontaneous right pneumothorax on 3/22 and a CT was placed. She had a progressive pleural effusion. A CT on 3/26 showed a possible empyema.   She remains intubated but currently is on CPAP/ PS as a trial vent wean. Her fevers have trended down and hemodynamically stable.  She is alert and responds to questions yes or no. C/o pain from ETT. Denies chest pain.   Past Medical History:  Diagnosis Date  . COPD (chronic obstructive pulmonary disease) ([place])   . Hypertension     Past Surgical History:  Procedure Laterality Date  . APPENDECTOMY      Family History  Problem Relation Age of Onset  . Emphysema Mother   . Heart attack Father     Social History:  reports that she has never smoked. She has never used smokeless tobacco. She reports that she drinks alcohol. Her drug history is not on file.  Allergies: No Known Allergies  Medications:  Scheduled: . ampicillin-sulbactam (UNASYN) IV  3 g Intravenous Q8H  . atorvastatin  20 mg Per Tube Daily  . chlorhexidine gluconate (MEDLINE KIT)  15 mL Mouth Rinse BID  . Chlorhexidine Gluconate Cloth  6 each Topical Daily  . free water  400 mL Per Tube Q8H  . furosemide  20 mg Intravenous Once  . heparin  5,000 Units Subcutaneous Q8H  . insulin aspart  0-9 Units Subcutaneous Q4H  . insulin glargine  25 Units Subcutaneous BID  . ipratropium-albuterol  3 mL Inhalation QID  . mouth rinse  15 mL Mouth Rinse QID  . methylPREDNISolone (SOLU-MEDROL) injection  40 mg  Intravenous Daily  . metoprolol tartrate  100 mg Per Tube BID  . pantoprazole sodium  40 mg Per Tube Daily  . sodium chloride flush  10-40 mL Intracatheter Q12H  . sodium chloride flush  3 mL Intravenous Q12H    Results for orders placed or performed during the hospital encounter of 11/05/16 (from the past 48 hour(s))  Glucose, capillary     Status: Abnormal   Collection Time: 11/12/16 12:24 PM  Result Value Ref Range   Glucose-Capillary 139 (H) 65 - 99 mg/dL   Comment 1 Notify RN    Comment 2 Document in Chart   Glucose, capillary     Status: Abnormal   Collection Time: 11/12/16  4:25 PM  Result Value Ref Range   Glucose-Capillary 127 (H) 65 - 99 mg/dL   Comment 1 Notify RN    Comment 2 Document in Chart   BMET today at 2000     Status: Abnormal   Collection Time: 11/12/16  7:25 PM  Result Value Ref Range   Sodium 157 (H) 135 - 145 mmol/L   Potassium 3.1 (L) 3.5 - 5.1 mmol/L   Chloride 115 (H) 101 - 111 mmol/L   CO2 35 (H) 22 - 32 mmol/L   Glucose, Bld 138 (H) 65 - 99 mg/dL   BUN 45 (H) 6 - 20 mg/dL   Creatinine, Ser 0.73 0.44 - 1.00 mg/dL  Calcium 7.5 (L) 8.9 - 10.3 mg/dL   GFR calc non Af Amer >60 >60 mL/min   GFR calc Af Amer >60 >60 mL/min    Comment: (NOTE) The eGFR has been calculated using the CKD EPI equation. This calculation has not been validated in all clinical situations. eGFR's persistently <60 mL/min signify possible Chronic Kidney Disease.    Anion gap 7 5 - 15  Glucose, capillary     Status: Abnormal   Collection Time: 11/12/16  8:02 PM  Result Value Ref Range   Glucose-Capillary 119 (H) 65 - 99 mg/dL   Comment 1 Notify RN   Glucose, capillary     Status: Abnormal   Collection Time: 11/12/16 11:37 PM  Result Value Ref Range   Glucose-Capillary 134 (H) 65 - 99 mg/dL   Comment 1 Notify RN   Glucose, capillary     Status: Abnormal   Collection Time: 11/13/16  4:18 AM  Result Value Ref Range   Glucose-Capillary 110 (H) 65 - 99 mg/dL   Comment 1  Notify RN   BMET in AM     Status: Abnormal   Collection Time: 11/13/16  4:30 AM  Result Value Ref Range   Sodium 156 (H) 135 - 145 mmol/L   Potassium 3.2 (L) 3.5 - 5.1 mmol/L   Chloride 115 (H) 101 - 111 mmol/L   CO2 33 (H) 22 - 32 mmol/L   Glucose, Bld 121 (H) 65 - 99 mg/dL   BUN 39 (H) 6 - 20 mg/dL   Creatinine, Ser 0.69 0.44 - 1.00 mg/dL   Calcium 7.6 (L) 8.9 - 10.3 mg/dL   GFR calc non Af Amer >60 >60 mL/min   GFR calc Af Amer >60 >60 mL/min    Comment: (NOTE) The eGFR has been calculated using the CKD EPI equation. This calculation has not been validated in all clinical situations. eGFR's persistently <60 mL/min signify possible Chronic Kidney Disease.    Anion gap 8 5 - 15  CBC     Status: Abnormal   Collection Time: 11/13/16  4:30 AM  Result Value Ref Range   WBC 16.6 (H) 4.0 - 10.5 K/uL   RBC 3.28 (L) 3.87 - 5.11 MIL/uL   Hemoglobin 8.7 (L) 12.0 - 15.0 g/dL   HCT 28.9 (L) 36.0 - 46.0 %   MCV 88.1 78.0 - 100.0 fL   MCH 26.5 26.0 - 34.0 pg   MCHC 30.1 30.0 - 36.0 g/dL   RDW 15.8 (H) 11.5 - 15.5 %   Platelets 328 150 - 400 K/uL  Glucose, capillary     Status: Abnormal   Collection Time: 11/13/16  8:03 AM  Result Value Ref Range   Glucose-Capillary 134 (H) 65 - 99 mg/dL   Comment 1 Notify RN    Comment 2 Document in Chart   Glucose, capillary     Status: Abnormal   Collection Time: 11/13/16 11:27 AM  Result Value Ref Range   Glucose-Capillary 167 (H) 65 - 99 mg/dL   Comment 1 Notify RN    Comment 2 Document in Chart   Glucose, capillary     Status: None   Collection Time: 11/13/16  3:47 PM  Result Value Ref Range   Glucose-Capillary 87 65 - 99 mg/dL   Comment 1 Document in Chart   BMET in AM     Status: Abnormal   Collection Time: 11/13/16  5:09 PM  Result Value Ref Range   Sodium 150 (H) 135 - 145 mmol/L  Potassium 4.1 3.5 - 5.1 mmol/L    Comment: DELTA CHECK NOTED   Chloride 111 101 - 111 mmol/L   CO2 31 22 - 32 mmol/L   Glucose, Bld 143 (H) 65 - 99  mg/dL   BUN 38 (H) 6 - 20 mg/dL   Creatinine, Ser 0.73 0.44 - 1.00 mg/dL   Calcium 7.3 (L) 8.9 - 10.3 mg/dL   GFR calc non Af Amer >60 >60 mL/min   GFR calc Af Amer >60 >60 mL/min    Comment: (NOTE) The eGFR has been calculated using the CKD EPI equation. This calculation has not been validated in all clinical situations. eGFR's persistently <60 mL/min signify possible Chronic Kidney Disease.    Anion gap 8 5 - 15  Glucose, capillary     Status: Abnormal   Collection Time: 11/13/16  7:41 PM  Result Value Ref Range   Glucose-Capillary 141 (H) 65 - 99 mg/dL   Comment 1 Notify RN    Comment 2 Document in Chart   Glucose, capillary     Status: Abnormal   Collection Time: 11/13/16 11:22 PM  Result Value Ref Range   Glucose-Capillary 137 (H) 65 - 99 mg/dL   Comment 1 Notify RN    Comment 2 Document in Chart   Glucose, capillary     Status: None   Collection Time: 11/14/16  3:18 AM  Result Value Ref Range   Glucose-Capillary 75 65 - 99 mg/dL   Comment 1 Notify RN    Comment 2 Document in Chart   CBC     Status: Abnormal   Collection Time: 11/14/16  3:53 AM  Result Value Ref Range   WBC 14.3 (H) 4.0 - 10.5 K/uL   RBC 2.93 (L) 3.87 - 5.11 MIL/uL   Hemoglobin 7.9 (L) 12.0 - 15.0 g/dL   HCT 25.9 (L) 36.0 - 46.0 %   MCV 88.4 78.0 - 100.0 fL   MCH 27.0 26.0 - 34.0 pg   MCHC 30.5 30.0 - 36.0 g/dL   RDW 16.3 (H) 11.5 - 15.5 %   Platelets 333 150 - 400 K/uL  Glucose, capillary     Status: None   Collection Time: 11/14/16  7:49 AM  Result Value Ref Range   Glucose-Capillary 74 65 - 99 mg/dL    Ct Chest Wo Contrast  Result Date: 11/13/2016 CLINICAL DATA:  66 year old female with history of loculated pleural effusion with chest tube in place. EXAM: CT CHEST WITHOUT CONTRAST TECHNIQUE: Multidetector CT imaging of the chest was performed following the standard protocol without IV contrast. COMPARISON:  Chest CT 02/19/2015. Multiple recent prior chest radiographs. FINDINGS:  Cardiovascular: Heart size is normal. There is no significant pericardial fluid, thickening or pericardial calcification. There is aortic atherosclerosis, as well as atherosclerosis of the great vessels of the mediastinum and the coronary arteries, including calcified atherosclerotic plaque in the left main and left anterior descending coronary arteries. Left internal jugular central venous catheter with tip terminating in the superior aspect of the right atrium. Mediastinum/Nodes: Patient is intubated, with the tip of the endotracheal to the lying approximately 2.1 cm above the carina. Nasogastric tube extends into the stomach. Numerous borderline enlarged and mildly enlarged mediastinal and bilateral hilar lymph nodes, measuring up to 11 mm in short axis in the subcarinal nodal station. Esophagus is unremarkable in appearance. No axillary lymphadenopathy. Lungs/Pleura: Right-sided chest tube in position with tip directed into the medial aspect of the apex of the right hemithorax. Large complex right-sided pleural  fluid and gas collection. Fluid ranges from low to intermediate attenuation, suggesting proteinaceous contents. Some of the pneumothorax component is confluent anteriorly, while other locules of gas are noted throughout the lower right hemithorax, some of which appear to be within collapsed/consolidated lung tissue, but other small locules of gas likely are present within loculated pleural fluid in the lower right hemithorax. In the medial aspect of the lower right hemithorax there is what appears to be a thick-walled cavitary area, which is favored to be within the lung parenchyma rather than in the pleural space, measuring up to 6.4 x 2.4 x 6.5 cm (axial image 83 of series 201, and coronal image 56 of series 203). Areas of bronchiectasis are noted within the right lower lobe. Within the aerated portions of the lungs there is patchy multifocal ground-glass attenuation and septal thickening. The  ground-glass attenuation is most evident in a peribronchovascular distribution, likely infectious/inflammatory in etiology, most severe in the left upper lobe. No significant left pleural effusion. Upper Abdomen: Unremarkable. Musculoskeletal: Extensive emphysema is noted throughout the subcutaneous fat of the chest wall bilaterally extending up into the cervical regions bilaterally (right greater than left). There are no aggressive appearing lytic or blastic lesions noted in the visualized portions of the skeleton. IMPRESSION: 1. Findings are most compatible with right lower lobe necrotizing pneumonia, with what appears to be a large right lower lobe cavity in the medial aspect of the basal segments of the right lower lobe, as well as a large right empyema. In this complex right pleural gas and fluid collections there both a small anterior pneumothorax, as well as multifocal loculated components of gas within the inferior aspect of the right pleural space, as above. Right-sided chest tube is directed into the medial aspect of the right apex, and the tip of the chest tube is currently surrounded by very little pleural fluid or gas. 2. Multifocal patchy interstitial and airspace disease asymmetrically distributed throughout the remaining aerated portions of the lungs, favored to reflect severe multilobar bronchopneumonia. 3. Aortic atherosclerosis, in addition to left main and left anterior descending coronary artery disease. Please note that although the presence of coronary artery calcium documents the presence of coronary artery disease, the severity of this disease and any potential stenosis cannot be assessed on this non-gated CT examination. Assessment for potential risk factor modification, dietary therapy or pharmacologic therapy may be warranted, if clinically indicated. 4. Support apparatus, as above. Electronically Signed   By: Vinnie Langton M.D.   On: 11/13/2016 15:19   Dg Chest Port 1 View  Result  Date: 11/14/2016 CLINICAL DATA:  Respiratory failure. EXAM: PORTABLE CHEST 1 VIEW COMPARISON:  CT 11/13/2016.  Chest x-ray 11/13/2016. FINDINGS: Endotracheal tube, NG tube, left IJ line, right chest tube stable position. Heart size stable. Bilateral pulmonary infiltrates with right side pleural effusion/ possible empyema again noted. Previously identified anterior pneumothorax is best demonstrated by CT of 11/13/2016. Mild chest wall subcutaneous emphysema again noted. IMPRESSION: 1. Lines and tubes including right chest tube in stable position. 2. Persistent bibasilar infiltrates. Persistent right sided prominent pleural effusion/ possible empyema again noted without significant change. Previously identified anterior pneumothorax best demonstrated about recent CT. No definite pneumothorax noted on this chest x-ray. Electronically Signed   By: Marcello Moores  Register   On: 11/14/2016 07:04   Dg Chest Port 1 View  Result Date: 11/13/2016 CLINICAL DATA:  Pneumothorax.  Shortness of breath . EXAM: PORTABLE CHEST 1 VIEW COMPARISON:  11/12/2016. FINDINGS: Endotracheal tube, left IJ line,  NG tube, right chest tube in stable position. No pneumothorax. Persistent right side pleural effusion with increase in size from prior exam. Right base atelectasis. Heart size normal. Subcutaneous emphysema has improved. IMPRESSION: 1. Lines and tubes is including right chest tube in stable position. No pneumothorax. 2.  Interim increase in right pleural effusion. Electronically Signed   By: Marcello Moores  Register   On: 11/13/2016 06:47    Review of Systems  Reason unable to perform ROS: limited due to intubation.  Constitutional: Positive for fever and malaise/fatigue.  Respiratory: Positive for shortness of breath.        C/o pain from ETT  Cardiovascular: Negative for chest pain.   Blood pressure 120/64, pulse 92, temperature 98.5 F (36.9 C), temperature source Oral, resp. rate (!) 27, height 5' 5.5" (1.664 m), weight 153 lb 7 oz  (69.6 kg), SpO2 94 %. Physical Exam  Vitals reviewed. Constitutional: She appears distressed (mildly).  Anxious ill-appearing woman   HENT:  Head: Normocephalic and atraumatic.  ETT in place  Eyes: Conjunctivae and EOM are normal. No scleral icterus.  Neck: No thyromegaly present.  Respiratory: She has wheezes (loud bilateral).  tachypnic  GI: Soft. She exhibits no distension. There is no tenderness.  Musculoskeletal: She exhibits no edema.  Lymphadenopathy:    She has no cervical adenopathy.  Neurological: She is alert. No cranial nerve deficit.  Moves all 4 ext and follows commands  Skin: Skin is warm and dry. She is not diaphoretic.    Assessment/Plan: 66 yo woman with baseline COPD and chronic respiratory failure requiring home O2 who presented with pneumonia complicated by sepsis, pneumothorax and now a probable empyema. She has responded to antibiotics and has made some progress towards weaning from there vent although she still is wheezing significantly.  She will likely need VATS for her empyema. An alternative is to try thrombolytics via the indwelling chest tube but I think that is unlikely to be effective with multiple loculations and the chest tube not in particularly good place in regards to the effusion (was placed appropriately for the pneumo).   Timing uncertain at this point due to OR availability, but will proceed tomorrow if possible.  Melrose Nakayama 11/14/2016, 9:24 AM   I talked with Mrs. Rincon. She is alert and understands but is intubated and has limited ability to communicate. I also spoke with her daughter Morrison Old. I informed them of the need for bronch, right VATS, drainage of empyema. I informed them of the indications, risks, benefits and alternatives. They understand the risks include but are not limited to death, MI, DVT, PE, stroke, bleeding, possible need for transfusion, infection, prolonged air leaks, respiratory or renal failure as well  as the possibility of other unforeseeable complications.  They agree to proceed. For [place]. Roxan Hockey, MD [facility] [phone]

## 2016-11-14 NOTE — Progress Notes (Signed)
Name: Diana Gardner MRN: [id number] DOB: [birth year]    ADMISSION DATE:  11/05/2016 CONSULTATION DATE:  11/06/2016  REFERRING MD :  [facility] - [facility]  CHIEF COMPLAINT:  Pneumonia, COPD and pleural effusion  BRIEF PATIENT DESCRIPTION: 66 year old never smoker with COPD due to second hand smoke exposure adm 3/18  with the fever and productive cough.  Found to have an infiltrate on CXR, determined to be CAP.  There was a concern for pleural effusion and PCCM was consulted for empyema.  Patient has base line COPD and is 2-3 liter O2 dependent at home on combivent and albuterol.  SIGNIFICANT EVENTS  11/05/2016 admission for PNA 3/20 transfer to ICU and intubated 3/21 refractory shock 3/22 > right PTX > chest tube placed   STUDIES:  3/21 2 d >> EF 65-70%, G1DD  CULTURES: Sputum 3/20 > neg 3/21 bc x 2>>ng 3/21 RVP>> neg u strep ag POS  ANTIBIOTICS: Azithromycin 3/18 > 3/18 Ceftriaxone 3/18 > 3/21 Vanc 3/21 >  3/21 Unasyn 3/21 >   LINES/TUBES: ETT 3/20 > 3/20 cvl>> 3/20 rad aline>>3/25   SUBJECTIVE:   Critically ill, intubated Afebrile Failing wean last 2 ds  VITAL SIGNS: Temp:  [97.8 F (36.6 C)-98.7 F (37.1 C)] 98.5 F (36.9 C) (03/27 0751) Pulse Rate:  [64-87] 79 (03/27 0817) Resp:  [19-34] 24 (03/27 0817) BP: (70-152)/(43-69) 152/57 (03/27 0817) SpO2:  [93 %-99 %] 96 % (03/27 0830) FiO2 (%):  [40 %] 40 % (03/27 0817) Weight:  [153 lb 7 oz (69.6 kg)] 153 lb 7 oz (69.6 kg) (03/27 0426)  PHYSICAL EXAMINATION: Gen: Intubated, mod resp distress during wean  HEENT:  ET tube in place, no oral lesions Cardiovascular:  Regular, tachycardic, no edema Lungs: BL scattered wheezing . Right sided chest tube -no air leak Abdomen:  Soft, positive bowel sounds Musculoskeletal:  No deformity Skin:  No rash   Recent Labs Lab 11/12/16 1925 11/13/16 0430 11/13/16 1709  NA 157* 156* 150*  K 3.1* 3.2* 4.1  CL 115* 115* 111  CO2 35* 33* 31  BUN 45* 39* 38*    CREATININE 0.73 0.69 0.73  GLUCOSE 138* 121* 143*    Recent Labs Lab 11/12/16 0415 11/13/16 0430 11/14/16 0353  HGB 9.1* 8.7* 7.9*  HCT 30.7* 28.9* 25.9*  WBC 17.1* 16.6* 14.3*  PLT 276 328 333   Ct Chest Wo Contrast  Result Date: 11/13/2016 CLINICAL DATA:  66 year old female with history of loculated pleural effusion with chest tube in place. EXAM: CT CHEST WITHOUT CONTRAST TECHNIQUE: Multidetector CT imaging of the chest was performed following the standard protocol without IV contrast. COMPARISON:  Chest CT 02/19/2015. Multiple recent prior chest radiographs. FINDINGS: Cardiovascular: Heart size is normal. There is no significant pericardial fluid, thickening or pericardial calcification. There is aortic atherosclerosis, as well as atherosclerosis of the great vessels of the mediastinum and the coronary arteries, including calcified atherosclerotic plaque in the left main and left anterior descending coronary arteries. Left internal jugular central venous catheter with tip terminating in the superior aspect of the right atrium. Mediastinum/Nodes: Patient is intubated, with the tip of the endotracheal to the lying approximately 2.1 cm above the carina. Nasogastric tube extends into the stomach. Numerous borderline enlarged and mildly enlarged mediastinal and bilateral hilar lymph nodes, measuring up to 11 mm in short axis in the subcarinal nodal station. Esophagus is unremarkable in appearance. No axillary lymphadenopathy. Lungs/Pleura: Right-sided chest tube in position with tip directed into the medial aspect of  the apex of the right hemithorax. Large complex right-sided pleural fluid and gas collection. Fluid ranges from low to intermediate attenuation, suggesting proteinaceous contents. Some of the pneumothorax component is confluent anteriorly, while other locules of gas are noted throughout the lower right hemithorax, some of which appear to be within collapsed/consolidated lung tissue, but  other small locules of gas likely are present within loculated pleural fluid in the lower right hemithorax. In the medial aspect of the lower right hemithorax there is what appears to be a thick-walled cavitary area, which is favored to be within the lung parenchyma rather than in the pleural space, measuring up to 6.4 x 2.4 x 6.5 cm (axial image 83 of series 201, and coronal image 56 of series 203). Areas of bronchiectasis are noted within the right lower lobe. Within the aerated portions of the lungs there is patchy multifocal ground-glass attenuation and septal thickening. The ground-glass attenuation is most evident in a peribronchovascular distribution, likely infectious/inflammatory in etiology, most severe in the left upper lobe. No significant left pleural effusion. Upper Abdomen: Unremarkable. Musculoskeletal: Extensive emphysema is noted throughout the subcutaneous fat of the chest wall bilaterally extending up into the cervical regions bilaterally (right greater than left). There are no aggressive appearing lytic or blastic lesions noted in the visualized portions of the skeleton. IMPRESSION: 1. Findings are most compatible with right lower lobe necrotizing pneumonia, with what appears to be a large right lower lobe cavity in the medial aspect of the basal segments of the right lower lobe, as well as a large right empyema. In this complex right pleural gas and fluid collections there both a small anterior pneumothorax, as well as multifocal loculated components of gas within the inferior aspect of the right pleural space, as above. Right-sided chest tube is directed into the medial aspect of the right apex, and the tip of the chest tube is currently surrounded by very little pleural fluid or gas. 2. Multifocal patchy interstitial and airspace disease asymmetrically distributed throughout the remaining aerated portions of the lungs, favored to reflect severe multilobar bronchopneumonia. 3. Aortic  atherosclerosis, in addition to left main and left anterior descending coronary artery disease. Please note that although the presence of coronary artery calcium documents the presence of coronary artery disease, the severity of this disease and any potential stenosis cannot be assessed on this non-gated CT examination. Assessment for potential risk factor modification, dietary therapy or pharmacologic therapy may be warranted, if clinically indicated. 4. Support apparatus, as above. Electronically Signed   By: Vinnie Langton M.D.   On: 11/13/2016 15:19   Dg Chest Port 1 View  Result Date: 11/14/2016 CLINICAL DATA:  Respiratory failure. EXAM: PORTABLE CHEST 1 VIEW COMPARISON:  CT 11/13/2016.  Chest x-ray 11/13/2016. FINDINGS: Endotracheal tube, NG tube, left IJ line, right chest tube stable position. Heart size stable. Bilateral pulmonary infiltrates with right side pleural effusion/ possible empyema again noted. Previously identified anterior pneumothorax is best demonstrated by CT of 11/13/2016. Mild chest wall subcutaneous emphysema again noted. IMPRESSION: 1. Lines and tubes including right chest tube in stable position. 2. Persistent bibasilar infiltrates. Persistent right sided prominent pleural effusion/ possible empyema again noted without significant change. Previously identified anterior pneumothorax best demonstrated about recent CT. No definite pneumothorax noted on this chest x-ray. Electronically Signed   By: Marcello Moores  Register   On: 11/14/2016 07:04   Dg Chest Port 1 View  Result Date: 11/13/2016 CLINICAL DATA:  Pneumothorax.  Shortness of breath . EXAM: PORTABLE CHEST 1  VIEW COMPARISON:  11/12/2016. FINDINGS: Endotracheal tube, left IJ line, NG tube, right chest tube in stable position. No pneumothorax. Persistent right side pleural effusion with increase in size from prior exam. Right base atelectasis. Heart size normal. Subcutaneous emphysema has improved. IMPRESSION: 1. Lines and tubes is  including right chest tube in stable position. No pneumothorax. 2.  Interim increase in right pleural effusion. Electronically Signed   By: Marcello Moores  Register   On: 11/13/2016 06:47     DISCUSSION: 66 year old female with COPD admitted 3/18 with sepsis secondary to pneumococcal pneumonia. Course complicated by pneumothorax, hypernatremia & now a loculated rt empyema  ASSESSMENT / PLAN:  PULMONARY A: Acute hypoxemic respiratory failure secondary to pneumonia complicated by likely aspiration. New right PTX 3/22 - likely to worsen given high PEEP. Pleural effusion right side > eval by bedside US, too small to drain.  COPD exacerbation on admission. CAP. ARDS - started on ARDS protocol 3/21. P:   SBTs as tolerated Chest tube to suction  VAP prevention orders Solumedrol 40 x 1, ct scheduled bronchodilators VATS planned per TCTS    CARDIOVASCULAR A:  Hypotension/shock - hypovolemia and  sepsis.  Pressors turned off AM 3/22 and remain off. H/o HTN - now back on hypertensive side 3/23. P:  Labetalol as needed Ct scheduled metoprolol 3/25 per  home regimen   RENAL A:   AKI - secondary to hypoperfusion, NSAIDS, and ACEi.  Gradually improving. Hypokalemia - s/p repletion AM 3/23. Volume overload - improving Hypernatremia P:   Correct electrolytes as indicated Lasix 20 x 1 ct free water 400 q 4h, ct  D5W @ 30/h  GASTROINTESTINAL A:   Nutrition. GI prophylaxis. P:   continue tube feeding Continue Protonix  HEMATOLOGIC A:   Anemia. VTE prophylaxis. P:  Transfusion threshold hemoglobin greater than 7   DVT prophylaxis   INFECTIOUS A:   Septic shock secondary to probable aspiration +/- CAP. ? Empyema P:   Continue current antibiotics   ENDOCRINE A:   Hyperglycemia, on d5W , off steroids. P:   Continue SSI, Lantus   NEUROLOGIC A:   Acute metabolic encephalopathy: sepsis and acidosis; resolved 3/23. P:   RASS goal: -1 Continue fentanyl and Versed as  needed Daily wakeup assessment.    FAMILY  - Updates: Updated daughter 3/26  - Inter-disciplinary family meet or Palliative Care meeting due by: 3/25  Independent critical care time is 32 minutes.     Kara Mead MD. Shade Flood. [facility] Pager [phone] If no response call [phone]    11/14/2016, 8:54 AM

## 2016-11-15 ENCOUNTER — Inpatient Hospital Stay (HOSPITAL_COMMUNITY): Payer: Medicare Other | Admitting: Certified Registered Nurse Anesthetist

## 2016-11-15 ENCOUNTER — Encounter (HOSPITAL_COMMUNITY): Payer: Self-pay | Admitting: Certified Registered Nurse Anesthetist

## 2016-11-15 ENCOUNTER — Inpatient Hospital Stay (HOSPITAL_COMMUNITY): Payer: Medicare Other

## 2016-11-15 ENCOUNTER — Encounter (HOSPITAL_COMMUNITY)
Admission: EM | Disposition: A | Discharge status: Skilled Nursing Facility | Payer: Self-pay | Source: Home / Self Care | Attending: Pulmonary Disease

## 2016-11-15 DIAGNOSIS — J180 Bronchopneumonia, unspecified organism: Secondary | ICD-10-CM

## 2016-11-15 DIAGNOSIS — J869 Pyothorax without fistula: Secondary | ICD-10-CM

## 2016-11-15 DIAGNOSIS — Z902 Acquired absence of lung [part of]: Secondary | ICD-10-CM

## 2016-11-15 HISTORY — PX: VIDEO ASSISTED THORACOSCOPY (VATS)/EMPYEMA: SHX6172

## 2016-11-15 HISTORY — PX: VIDEO BRONCHOSCOPY: SHX5072

## 2016-11-15 LAB — POCT I-STAT 3, ART BLOOD GAS (G3+)
Acid-Base Excess: 8 mmol/L — ABNORMAL HIGH (ref 0.0–2.0)
BICARBONATE: 32.3 mmol/L — AB (ref 20.0–28.0)
O2 Saturation: 97 %
PO2 ART: 83 mmHg (ref 83.0–108.0)
TCO2: 34 mmol/L (ref 0–100)
pCO2 arterial: 45.3 mmHg (ref 32.0–48.0)
pH, Arterial: 7.461 — ABNORMAL HIGH (ref 7.350–7.450)

## 2016-11-15 LAB — PREPARE RBC (CROSSMATCH)

## 2016-11-15 LAB — BASIC METABOLIC PANEL
Anion gap: 9 (ref 5–15)
BUN: 38 mg/dL — AB (ref 6–20)
CHLORIDE: 105 mmol/L (ref 101–111)
CO2: 31 mmol/L (ref 22–32)
CREATININE: 0.67 mg/dL (ref 0.44–1.00)
Calcium: 7.8 mg/dL — ABNORMAL LOW (ref 8.9–10.3)
GFR calc Af Amer: >60 mL/min (ref >60)
GFR calc non Af Amer: >60 mL/min (ref >60)
Glucose, Bld: 96 mg/dL (ref 65–99)
Potassium: 3.7 mmol/L (ref 3.5–5.1)
SODIUM: 145 mmol/L (ref 135–145)

## 2016-11-15 LAB — GLUCOSE, CAPILLARY
GLUCOSE-CAPILLARY: 173 mg/dL — AB (ref 65–99)
GLUCOSE-CAPILLARY: 70 mg/dL (ref 65–99)
Glucose-Capillary: 89 mg/dL (ref 65–99)

## 2016-11-15 LAB — CBC
HEMATOCRIT: 24.3 % — AB (ref 36.0–46.0)
HEMOGLOBIN: 7.5 g/dL — AB (ref 12.0–15.0)
MCH: 26.6 pg (ref 26.0–34.0)
MCHC: 30.9 g/dL (ref 30.0–36.0)
MCV: 86.2 fL (ref 78.0–100.0)
Platelets: 402 10*3/uL — ABNORMAL HIGH (ref 150–400)
RBC: 2.82 MIL/uL — AB (ref 3.87–5.11)
RDW: 16.2 % — ABNORMAL HIGH (ref 11.5–15.5)
WBC: 11.3 10*3/uL — AB (ref 4.0–10.5)

## 2016-11-15 LAB — POCT I-STAT 4, (NA,K, GLUC, HGB,HCT)
GLUCOSE: 155 mg/dL — AB (ref 65–99)
Glucose, Bld: 157 mg/dL — ABNORMAL HIGH (ref 65–99)
HCT: 33 % — ABNORMAL LOW (ref 36.0–46.0)
HEMATOCRIT: 27 % — AB (ref 36.0–46.0)
HEMOGLOBIN: 9.2 g/dL — AB (ref 12.0–15.0)
HEMOGLOBIN: 11.2 g/dL — AB (ref 12.0–15.0)
POTASSIUM: 3.7 mmol/L (ref 3.5–5.1)
POTASSIUM: 4 mmol/L (ref 3.5–5.1)
SODIUM: 144 mmol/L (ref 135–145)
Sodium: 144 mmol/L (ref 135–145)

## 2016-11-15 LAB — GRAM STAIN

## 2016-11-15 SURGERY — BRONCHOSCOPY, VIDEO-ASSISTED
Anesthesia: General | Site: Chest | Laterality: Right

## 2016-11-15 MED ORDER — ONDANSETRON HCL 4 MG/2ML IJ SOLN: 4.0000 mg | Freq: Four times a day (QID) | INTRAMUSCULAR | Status: DC | PRN

## 2016-11-15 MED ORDER — FENTANYL CITRATE (PF) 250 MCG/5ML IJ SOLN
INTRAMUSCULAR | Status: AC
  Filled 2016-11-15: qty 5

## 2016-11-15 MED ORDER — EPHEDRINE 5 MG/ML INJ
INTRAVENOUS | Status: AC
  Filled 2016-11-15: qty 10

## 2016-11-15 MED ORDER — SUCCINYLCHOLINE CHLORIDE 200 MG/10ML IV SOSY
PREFILLED_SYRINGE | INTRAVENOUS | Status: AC
  Filled 2016-11-15: qty 10

## 2016-11-15 MED ORDER — SENNOSIDES-DOCUSATE SODIUM 8.6-50 MG PO TABS
1.0000 | ORAL_TABLET | Freq: Every day | ORAL | Status: DC
  Administered 2016-11-16: 1 via ORAL
  Filled 2016-11-15 (×2): qty 1

## 2016-11-15 MED ORDER — ACETAMINOPHEN 500 MG PO TABS
1000.0000 mg | ORAL_TABLET | Freq: Four times a day (QID) | ORAL | Status: DC
  Administered 2016-11-17 (×2): 1000 mg via ORAL
  Filled 2016-11-15 (×9): qty 2

## 2016-11-15 MED ORDER — DEXTROSE-NACL 5-0.9 % IV SOLN
INTRAVENOUS | Status: DC
  Administered 2016-11-15: 17:00:00 via INTRAVENOUS
  Administered 2016-11-16: 125 mL/h via INTRAVENOUS

## 2016-11-15 MED ORDER — DEXTROSE 5 % IV SOLN
INTRAVENOUS | Status: DC | PRN
  Administered 2016-11-15: 40 ug/min via INTRAVENOUS

## 2016-11-15 MED ORDER — MIDAZOLAM HCL 5 MG/5ML IJ SOLN
INTRAMUSCULAR | Status: DC | PRN
  Administered 2016-11-15: 2 mg via INTRAVENOUS

## 2016-11-15 MED ORDER — ONDANSETRON HCL 4 MG/2ML IJ SOLN
INTRAMUSCULAR | Status: AC
  Filled 2016-11-15: qty 2

## 2016-11-15 MED ORDER — VANCOMYCIN HCL IN DEXTROSE 1-5 GM/200ML-% IV SOLN
1000.0000 mg | INTRAVENOUS | Status: AC
Start: 2016-11-15 — End: 2016-11-15
  Administered 2016-11-15: 1000 mg via INTRAVENOUS
  Filled 2016-11-15: qty 200

## 2016-11-15 MED ORDER — SODIUM CHLORIDE 0.9 % IV SOLN
30.0000 meq | Freq: Every day | INTRAVENOUS | Status: DC | PRN
  Administered 2016-11-19 – 2016-11-21 (×3): 30 meq via INTRAVENOUS
  Filled 2016-11-15 (×7): qty 15

## 2016-11-15 MED ORDER — SODIUM CHLORIDE 0.9 % IV SOLN
Freq: Once | INTRAVENOUS | Status: AC
  Administered 2016-11-15: 14:00:00 via INTRAVENOUS

## 2016-11-15 MED ORDER — ALBUMIN HUMAN 5 % IV SOLN
INTRAVENOUS | Status: DC | PRN
  Administered 2016-11-15 (×3): via INTRAVENOUS

## 2016-11-15 MED ORDER — SODIUM CHLORIDE 0.9 % IV SOLN: Freq: Once | INTRAVENOUS | Status: DC

## 2016-11-15 MED ORDER — VANCOMYCIN HCL IN DEXTROSE 1-5 GM/200ML-% IV SOLN
1000.0000 mg | Freq: Two times a day (BID) | INTRAVENOUS | Status: AC
  Administered 2016-11-16: 1000 mg via INTRAVENOUS
  Filled 2016-11-15: qty 200

## 2016-11-15 MED ORDER — PHENYLEPHRINE 40 MCG/ML (10ML) SYRINGE FOR IV PUSH (FOR BLOOD PRESSURE SUPPORT)
PREFILLED_SYRINGE | INTRAVENOUS | Status: DC | PRN
  Administered 2016-11-15 (×3): 120 ug via INTRAVENOUS

## 2016-11-15 MED ORDER — ROCURONIUM BROMIDE 100 MG/10ML IV SOLN
INTRAVENOUS | Status: DC | PRN
  Administered 2016-11-15: 20 mg via INTRAVENOUS
  Administered 2016-11-15: 30 mg via INTRAVENOUS
  Administered 2016-11-15: 20 mg via INTRAVENOUS
  Administered 2016-11-15: 50 mg via INTRAVENOUS
  Administered 2016-11-15: 20 mg via INTRAVENOUS
  Administered 2016-11-15: 10 mg via INTRAVENOUS

## 2016-11-15 MED ORDER — ACETAMINOPHEN 160 MG/5ML PO SOLN
1000.0000 mg | Freq: Four times a day (QID) | ORAL | Status: DC
  Administered 2016-11-15 – 2016-11-16 (×5): 1000 mg via ORAL
  Filled 2016-11-15 (×10): qty 40.6

## 2016-11-15 MED ORDER — ROCURONIUM BROMIDE 50 MG/5ML IV SOSY
PREFILLED_SYRINGE | INTRAVENOUS | Status: AC
  Filled 2016-11-15: qty 15

## 2016-11-15 MED ORDER — FENTANYL CITRATE (PF) 100 MCG/2ML IJ SOLN
INTRAMUSCULAR | Status: DC | PRN
  Administered 2016-11-15: 100 ug via INTRAVENOUS
  Administered 2016-11-15: 50 ug via INTRAVENOUS
  Administered 2016-11-15: 100 ug via INTRAVENOUS

## 2016-11-15 MED ORDER — BISACODYL 5 MG PO TBEC
10.0000 mg | DELAYED_RELEASE_TABLET | Freq: Every day | ORAL | Status: DC
  Administered 2016-11-16: 10 mg via ORAL

## 2016-11-15 MED ORDER — 0.9 % SODIUM CHLORIDE (POUR BTL) OPTIME
TOPICAL | Status: DC | PRN
  Administered 2016-11-15: 2000 mL
  Administered 2016-11-15: 1000 mL
  Administered 2016-11-15: 2000 mL

## 2016-11-15 MED ORDER — MIDAZOLAM HCL 2 MG/2ML IJ SOLN
INTRAMUSCULAR | Status: AC
  Filled 2016-11-15: qty 2

## 2016-11-15 MED ORDER — PROPOFOL 1000 MG/100ML IV EMUL
INTRAVENOUS | Status: AC
  Filled 2016-11-15: qty 100

## 2016-11-15 MED ORDER — LACTATED RINGERS IV SOLN
INTRAVENOUS | Status: DC | PRN
  Administered 2016-11-15 (×2): via INTRAVENOUS

## 2016-11-15 MED ORDER — PROPOFOL 10 MG/ML IV BOLUS
INTRAVENOUS | Status: AC
  Filled 2016-11-15: qty 20

## 2016-11-15 SURGICAL SUPPLY — 115 items
ADH SKN CLS APL DERMABOND .7 (GAUZE/BANDAGES/DRESSINGS) ×2
APPLICATOR COTTON TIP 6IN STRL (MISCELLANEOUS) ×2 IMPLANT
APPLIER CLIP LOGIC TI 5 (MISCELLANEOUS) ×2 IMPLANT
APR CLP MED LRG 33X5 (MISCELLANEOUS) ×2
BAG SPEC RTRVL LRG 6X4 10 (ENDOMECHANICALS)
BALL CTTN LRG ABS STRL LF (GAUZE/BANDAGES/DRESSINGS)
BLOCK BITE 60FR ADLT L/F BLUE (MISCELLANEOUS) ×2 IMPLANT
BRUSH CYTOL CELLEBRITY 1.5X140 (MISCELLANEOUS) IMPLANT
CANISTER SUCT 3000ML PPV (MISCELLANEOUS) ×6 IMPLANT
CATH KIT ON Q 5IN SLV (PAIN MANAGEMENT) IMPLANT
CATH THORACIC 28FR (CATHETERS) ×2 IMPLANT
CATH THORACIC 36FR (CATHETERS) IMPLANT
CATH THORACIC 36FR RT ANG (CATHETERS) IMPLANT
CLIP TI MEDIUM 6 (CLIP) ×2 IMPLANT
CONN ST 1/4X3/8  BEN (MISCELLANEOUS) ×4
CONN ST 1/4X3/8 BEN (MISCELLANEOUS) IMPLANT
CONN Y 3/8X3/8X3/8  BEN (MISCELLANEOUS) ×2
CONN Y 3/8X3/8X3/8 BEN (MISCELLANEOUS) IMPLANT
CONT SPEC 4OZ CLIKSEAL STRL BL (MISCELLANEOUS) ×16 IMPLANT
CONT SPECI 4OZ STER CLIK (MISCELLANEOUS) ×8 IMPLANT
COTTONBALL LRG STERILE PKG (GAUZE/BANDAGES/DRESSINGS) IMPLANT
COVER BACK TABLE 60X90IN (DRAPES) ×2 IMPLANT
DERMABOND ADVANCED (GAUZE/BANDAGES/DRESSINGS) ×2
DERMABOND ADVANCED .7 DNX12 (GAUZE/BANDAGES/DRESSINGS) IMPLANT
DRAIN CHANNEL 28F RND 3/8 FF (WOUND CARE) ×4 IMPLANT
DRAIN CHANNEL 32F RND 10.7 FF (WOUND CARE) IMPLANT
DRAPE LAPAROSCOPIC ABDOMINAL (DRAPES) ×4 IMPLANT
DRAPE WARM FLUID 44X44 (DRAPE) ×4 IMPLANT
ELECT BLADE 6.5 EXT (BLADE) ×4 IMPLANT
ELECT REM PT RETURN 9FT ADLT (ELECTROSURGICAL) ×4
ELECTRODE REM PT RTRN 9FT ADLT (ELECTROSURGICAL) ×2 IMPLANT
FILTER STRAW FLUID ASPIR (MISCELLANEOUS) IMPLANT
FORCEPS BIOP RJ4 1.8 (CUTTING FORCEPS) IMPLANT
GAUZE SPONGE 4X4 12PLY STRL (GAUZE/BANDAGES/DRESSINGS) ×4 IMPLANT
GAUZE SPONGE 4X4 12PLY STRL LF (GAUZE/BANDAGES/DRESSINGS) ×2 IMPLANT
GLOVE BIO SURGEON STRL SZ 6.5 (GLOVE) ×2 IMPLANT
GLOVE BIO SURGEONS STRL SZ 6.5 (GLOVE) ×2
GLOVE SURG SIGNA 7.5 PF LTX (GLOVE) ×8 IMPLANT
GOWN STRL REUS W/ TWL LRG LVL3 (GOWN DISPOSABLE) ×4 IMPLANT
GOWN STRL REUS W/ TWL XL LVL3 (GOWN DISPOSABLE) ×4 IMPLANT
GOWN STRL REUS W/TWL LRG LVL3 (GOWN DISPOSABLE) ×12
GOWN STRL REUS W/TWL XL LVL3 (GOWN DISPOSABLE) ×4
KIT BASIN OR (CUSTOM PROCEDURE TRAY) ×4 IMPLANT
KIT CLEAN ENDO COMPLIANCE (KITS) ×2 IMPLANT
KIT ROOM TURNOVER OR (KITS) ×4 IMPLANT
KIT SUCTION CATH 14FR (SUCTIONS) ×4 IMPLANT
MARKER SKIN DUAL TIP RULER LAB (MISCELLANEOUS) ×2 IMPLANT
NDL BIOPSY TRANSBRONCH 21G (NEEDLE) IMPLANT
NEEDLE 22X1 1/2 (OR ONLY) (NEEDLE) IMPLANT
NEEDLE BIOPSY TRANSBRONCH 21G (NEEDLE) IMPLANT
NS IRRIG 1000ML POUR BTL (IV SOLUTION) ×16 IMPLANT
OIL SILICONE PENTAX (PARTS (SERVICE/REPAIRS)) ×4 IMPLANT
PACK CHEST (CUSTOM PROCEDURE TRAY) ×4 IMPLANT
PAD ARMBOARD 7.5X6 YLW CONV (MISCELLANEOUS) ×8 IMPLANT
POUCH ENDO CATCH II 15MM (MISCELLANEOUS) IMPLANT
POUCH SPECIMEN RETRIEVAL 10MM (ENDOMECHANICALS) IMPLANT
RELOAD GOLD ECHELON 45 (STAPLE) ×4 IMPLANT
RELOAD GREEN ECHELON 45 (STAPLE) ×6 IMPLANT
RELOAD STAPLE 35X2.5 WHT THIN (STAPLE) IMPLANT
RELOAD STAPLE 45 4.1 GRN THCK (STAPLE) IMPLANT
RELOAD STAPLE 45 GOLD REG/THCK (STAPLE) IMPLANT
SEALANT PROGEL (MISCELLANEOUS) IMPLANT
SEALANT SURG COSEAL 4ML (VASCULAR PRODUCTS) IMPLANT
SEALANT SURG COSEAL 8ML (VASCULAR PRODUCTS) IMPLANT
SOLUTION ANTI FOG 6CC (MISCELLANEOUS) ×6 IMPLANT
SPECIMEN JAR MEDIUM (MISCELLANEOUS) ×2 IMPLANT
SPONGE INTESTINAL PEANUT (DISPOSABLE) ×10 IMPLANT
SPONGE LAP 18X18 X RAY DECT (DISPOSABLE) ×4 IMPLANT
SPONGE TONSIL 1 RF SGL (DISPOSABLE) ×4 IMPLANT
STAPLE RELOAD 2.5MM WHITE (STAPLE) ×8 IMPLANT
STAPLE RELOAD 45 GRN (STAPLE) ×10 IMPLANT
STAPLE RELOAD 45MM GOLD (STAPLE) ×16 IMPLANT
STAPLE RELOAD 45MM GREEN (STAPLE) ×20
STAPLER ECHELON POWERED (MISCELLANEOUS) ×2 IMPLANT
STAPLER VASCULAR ECHELON 35 (CUTTER) ×2 IMPLANT
SUT PROLENE 4 0 RB 1 (SUTURE)
SUT PROLENE 4-0 RB1 .5 CRCL 36 (SUTURE) IMPLANT
SUT SILK  1 MH (SUTURE) ×8
SUT SILK 1 MH (SUTURE) ×2 IMPLANT
SUT SILK 1 TIES 10X30 (SUTURE) ×2 IMPLANT
SUT SILK 2 0SH CR/8 30 (SUTURE) IMPLANT
SUT SILK 3 0SH CR/8 30 (SUTURE) IMPLANT
SUT VIC AB 0 CTX 27 (SUTURE) IMPLANT
SUT VIC AB 1 CTX 27 (SUTURE) ×2 IMPLANT
SUT VIC AB 2-0 CT1 27 (SUTURE)
SUT VIC AB 2-0 CT1 TAPERPNT 27 (SUTURE) IMPLANT
SUT VIC AB 2-0 CTX 36 (SUTURE) ×4 IMPLANT
SUT VIC AB 3-0 MH 27 (SUTURE) IMPLANT
SUT VIC AB 3-0 SH 27 (SUTURE)
SUT VIC AB 3-0 SH 27X BRD (SUTURE) IMPLANT
SUT VIC AB 3-0 X1 27 (SUTURE) ×8 IMPLANT
SUT VICRYL 0 UR6 27IN ABS (SUTURE) ×4 IMPLANT
SUT VICRYL 2 TP 1 (SUTURE) ×2 IMPLANT
SWAB COLLECTION DEVICE MRSA (MISCELLANEOUS) IMPLANT
SYR 10ML LL (SYRINGE) IMPLANT
SYR 20ML ECCENTRIC (SYRINGE) ×4 IMPLANT
SYR 5ML LL (SYRINGE) ×2 IMPLANT
SYR 5ML LUER SLIP (SYRINGE) ×2 IMPLANT
SYR CONTROL 10ML LL (SYRINGE) IMPLANT
SYSTEM SAHARA CHEST DRAIN ATS (WOUND CARE) ×4 IMPLANT
TAPE CLOTH SURG 4X10 WHT LF (GAUZE/BANDAGES/DRESSINGS) ×2 IMPLANT
TIP APPLICATOR SPRAY EXTEND 16 (VASCULAR PRODUCTS) IMPLANT
TOWEL GREEN STERILE (TOWEL DISPOSABLE) ×8 IMPLANT
TOWEL GREEN STERILE FF (TOWEL DISPOSABLE) ×8 IMPLANT
TOWEL OR 17X24 6PK STRL BLUE (TOWEL DISPOSABLE) ×2 IMPLANT
TOWEL OR 17X26 10 PK STRL BLUE (TOWEL DISPOSABLE) ×6 IMPLANT
TRAP SPECIMEN MUCOUS 40CC (MISCELLANEOUS) ×10 IMPLANT
TRAY FOLEY W/METER SILVER 16FR (SET/KITS/TRAYS/PACK) ×2 IMPLANT
TROCAR XCEL BLADELESS 5X75MML (TROCAR) ×4 IMPLANT
TUBE ANAEROBIC SPECIMEN COL (MISCELLANEOUS) IMPLANT
TUBE CONNECTING 20'X1/4 (TUBING) ×3
TUBE CONNECTING 20X1/4 (TUBING) ×5 IMPLANT
TUNNELER SHEATH ON-Q 11GX8 DSP (PAIN MANAGEMENT) IMPLANT
WATER STERILE IRR 1000ML POUR (IV SOLUTION) ×6 IMPLANT
YANKAUER SUCT BULB TIP NO VENT (SUCTIONS) IMPLANT

## 2016-11-15 NOTE — Anesthesia Procedure Notes (Signed)
Procedure Name: Intubation Date/Time: 11/15/2016 12:48 PM Performed by: Carney Living Pre-anesthesia Checklist: Patient identified, Emergency Drugs available, Suction available, Patient being monitored and Timeout performed Patient Re-evaluated:Patient Re-evaluated prior to inductionOxygen Delivery Method: Circle system utilized Preoxygenation: Pre-oxygenation with 100% oxygen Laryngoscope Size: Mac and 4 Grade View: Grade I Tube type: Oral Endobronchial tube: Left, Double lumen EBT, EBT position confirmed by fiberoptic bronchoscope and EBT position confirmed by auscultation and 35 Fr Number of attempts: 1 Airway Equipment and Method: Stylet and Video-laryngoscopy Placement Confirmation: ETT inserted through vocal cords under direct vision,  positive ETCO2 and breath sounds checked- equal and bilateral Tube secured with: Tape Dental Injury: Teeth and Oropharynx as per pre-operative assessment

## 2016-11-15 NOTE — Anesthesia Postprocedure Evaluation (Signed)
Anesthesia Post Note  Patient: Diana Gardner  Procedure(s) Performed: Procedure(s) (LRB): VIDEO BRONCHOSCOPY (N/A) VIDEO ASSISTED THORACOSCOPY (VATS), MINI THORACOTOMY, DRAINAGE OF EMPYEMA, DECORTICATION, RIGHT LOWER LOBE RESECTION (Right)  Patient location during evaluation: ICU Anesthesia Type: General Level of consciousness: sedated and patient remains intubated per anesthesia plan Pain management: pain level controlled Vital Signs Assessment: post-procedure vital signs reviewed and stable Respiratory status: patient remains intubated per anesthesia plan and patient on ventilator - see flowsheet for VS Cardiovascular status: blood pressure returned to baseline Anesthetic complications: no       Last Vitals:  Vitals:   11/15/16 2000 11/15/16 2100  BP: 129/63 (!) 116/51  Pulse: 68 85  Resp: 19 16  Temp: (!) 35.4 C     Last Pain:  Vitals:   11/15/16 2000  TempSrc: Rectal  PainSc:                  , COKER

## 2016-11-15 NOTE — Progress Notes (Signed)
Patient to OR with family at bedside

## 2016-11-15 NOTE — Anesthesia Preprocedure Evaluation (Addendum)
Anesthesia Evaluation  Patient identified by MRN, date of birth, ID band Patient awake    Reviewed: Allergy & Precautions, NPO status , Patient's Chart, lab work & pertinent test results  Airway Mallampati: Intubated       Dental   Pulmonary    + rhonchi  + wheezing      Cardiovascular hypertension,  Rhythm:Regular Rate:Normal     Neuro/Psych    GI/Hepatic   Endo/Other    Renal/GU      Musculoskeletal   Abdominal   Peds  Hematology   Anesthesia Other Findings   Reproductive/Obstetrics                            Anesthesia Physical Anesthesia Plan  ASA: III  Anesthesia Plan: General   Post-op Pain Management:    Induction: Intravenous  Airway Management Planned: Double Lumen EBT  Additional Equipment: Arterial line and CVP  Intra-op Plan:   Post-operative Plan: Post-operative intubation/ventilation  Informed Consent: I have reviewed the patients History and Physical, chart, labs and discussed the procedure including the risks, benefits and alternatives for the proposed anesthesia with the patient or authorized representative who has indicated his/her understanding and acceptance.     Plan Discussed with:   Anesthesia Plan Comments:         Anesthesia Quick Evaluation

## 2016-11-15 NOTE — Op Note (Signed)
Diana Gardner, Diana Gardner NO.:  [id number]  MEDICAL RECORD NO.:  [id number]  LOCATION:  2M06C                        FACILITY:  [facility]  PHYSICIAN:  Revonda Standard. Roxan Hockey, M.D.DATE OF BIRTH:  [birth year]  DATE OF PROCEDURE:  11/15/2016 DATE OF DISCHARGE:                              OPERATIVE REPORT   PREOPERATIVE DIAGNOSES:  Right lower lobe pneumonia with right empyema.  POSTOPERATIVE DIAGNOSES:  Necrotizing right lower lobe pneumonia with empyema.  PROCEDURE:   Bronchoscopy, Right video-assisted thoracoscopy, Right mini thoracotomy, Drainage of empyema, Decortication, Right lower lobectomy.  SURGEON:  Revonda Standard. Roxan Hockey, M.D.  ASSISTANT:  John Giovanni, P.A.-C.  ANESTHESIA:  General.  FINDINGS:  Early organizing empyema, majority of right lower lobe, essentially the entire basilar component, was necrotic. Unable to resect just the necrotic portion due to its large size, therefore lobectomy performed.  CLINICAL NOTE:  Diana Gardner is a 66 year old woman with a history of COPD on home oxygen, who was admitted on March 18 with pneumonia after presenting with cough, fever, and shortness of breath.  She was started on IV antibiotics.  She developed a spontaneous pneumothorax and a chest tube was placed.  She then developed progressive pleural effusion.  A CT scan showed probable early organizing empyema with multiloculated effusion.  She was advised to undergo surgery to drain the effusion. The indications, risks, benefits, and alternatives were discussed in detail with the patient, who was intubated, as well as her daughter by telephone.  Both understood and accepted the risks and agreed to proceed.  OPERATIVE NOTE:  Diana Gardner was brought to the operating room already intubated on November 15, 2016.  She was given general anesthesia.  A flexible fiberoptic bronchoscopy was performed via the endotracheal Tube. There were thick yellow secretions  throughout.  These were sent for cultures.  There were no endobronchial lesions seen to the level of the subsegmental bronchi.  She then was reintubated with a double-lumen endotracheal tube.  A Foley catheter was in place.  Sequential compression devices were in place on the calves.  She was placed in a left lateral decubitus position and the right chest was prepped and draped in the usual sterile fashion.  Single lung ventilation of the left lung was initiated and was tolerated well throughout the procedure.  An incision was made in the seventh interspace in the midaxillary line.  A 5-mm port was inserted.  The thoracoscope was advanced into the chest.  There was poor visualization due to fluid.  The fluid was evacuated.  The thoracoscope was replaced. There was an early organizing empyema with multiple loculated fluid collections.  A 5-cm working incision was made anterolaterally in the fifth interspace.  The lung was freed up circumferentially.  There was significant fluid collections posteriorly and anteriorly at the base along the fissure as well as in the apex.  There was extensive fibrinous debris which was removed.  This was friable and did bleed.  The patient was already anemic entering the operating room and was transfused a total of 4 units throughout the procedure.  The right upper lobe was freed up circumferentially.  There was a fibrinous exudate and peel on the apical portion  of the right upper lobe medially and then on the lateral portion inferiorly.  This stripped off rather easily.  The middle lobe also had a peel, and there was an extensive peel in the major fissure.  This all again stripped off rather easily.  The lower lobe was densely adherent to the diaphragm and the posterior chest wall, these areas were freed up and there was some bleeding from the lung.  It became apparent that there was a tear in the right lung and there also was an unroofed abscess as the  lower lobe was freed up further, it became apparent that the entire basilar portion of the lower lobe was necrotic.  The superior segment was relatively spared.  Attempts to resect the portion of the unroofed abscess were unsuccessful due to the thickness of the tissue and inability to get a stapler to close without tearing the parenchyma.  Decision was made to perform a lobectomy as it was the only option.  The lower lobe pulmonary artery was dissected out. The fissure was relatively complete by the time the decortication was completed.  The artery was divided with an endoscopic vascular stapler.  The inferior ligament was mobilized.  This was very difficult. The inferior pulmonary vein was identified and encircled.  It was divided with the endoscopic vascular stapler as well.  The lower lobe bronchus was divided with the Endo-GIA stapler using a green cartridge. The remainder of the fissure between the upper lobe and the superior segment was completed with sequential firings of the endoscopic stapler again using green cartridges.  The specimen was removed and was inspected and the vast majority of the lower lobe was necrotic tissue with very little functional lung except in the small superior segment, which was spared.  Specimen was sent for permanent pathology.  The chest was copiously irrigated with over a liter of warm saline.  A test inflation to 30 cm of water showed no leakage from the bronchial stump, and 28-French Blake drains were placed along the diaphragm and posteriorly and a 28-French chest tube was placed anteriorly.  These were all placed through separate incisions.  They were secured with the skin with #1 silk sutures.  The upper and middle lobes were reinflated. The patient was placed back in supine position.  The chest tubes were placed to suction.  She was reintubated with a single-lumen endotracheal tube and taken back to the Medical Intensive Care Unit in critical  but stable condition.     Revonda Standard Roxan Hockey, M.D.     SCH/MEDQ  D:  11/15/2016  T:  11/15/2016  Job:  [id number]

## 2016-11-15 NOTE — Progress Notes (Signed)
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  Found to have an infiltrate on CXR, determined to be CAP.  There was a concern for pleural effusion and PCCM was consulted for empyema.  Patient has base line COPD and is 2-3 liter O2 dependent at home on combivent and albuterol.  SIGNIFICANT EVENTS  11/05/2016 admission for PNA 3/20 transfer to ICU and intubated 3/21 refractory shock 3/22 > right PTX > chest tube placed   STUDIES:  3/21 2 d >> EF 65-70%, G1DD  CULTURES: Sputum 3/20 > neg 3/21 bc x 2>>ng 3/21 RVP>> neg u strep ag POS  ANTIBIOTICS: Azithromycin 3/18 > 3/18 Ceftriaxone 3/18 > 3/21 Vanc 3/21 >  3/21 Unasyn 3/21 >   LINES/TUBES: ETT 3/20 > 3/20 cvl>> 3/20 rad aline>>3/25   SUBJECTIVE:   Critically ill, intubated, weaning better 10/5 Afebrile   VITAL SIGNS: Temp:  [97.8 F (36.6 C)-98.9 F (37.2 C)] 97.8 F (36.6 C) (03/28 0743) Pulse Rate:  [56-89] 81 (03/28 0900) Resp:  [10-31] 18 (03/28 0900) BP: (96-148)/(49-66) 118/56 (03/28 0900) SpO2:  [92 %-100 %] 94 % (03/28 0900) FiO2 (%):  [40 %] 40 % (03/28 0809) Weight:  [161 lb 9.6 oz (73.3 kg)] 161 lb 9.6 oz (73.3 kg) (03/28 0436)  PHYSICAL EXAMINATION: Gen: Intubated, calm, no distress HEENT:  ET tube in place, no oral lesions Cardiovascular:  Regular, tachycardic, no edema Lungs: decreased BL  wheezing . Right sided chest tube -no air leak Abdomen:  Soft, positive bowel sounds Musculoskeletal:  No deformity Skin:  No rash   Recent Labs Lab 11/14/16 0929 11/14/16 1714 11/15/16 0444  NA 145 143 145  K 3.8 3.9 3.7  CL 109 107 105  CO2 29 29 31   BUN 35* 36* 38*  CREATININE 0.73 0.73 0.67    GLUCOSE 145* 221* 96    Recent Labs Lab 11/14/16 0353 11/14/16 1714 11/15/16 0444  HGB 7.9* 7.8* 7.5*  HCT 25.9* 25.7* 24.3*  WBC 14.3* 12.5* 11.3*  PLT 333 339 402*   Ct Chest Wo Contrast  Result Date: 11/13/2016 CLINICAL DATA:  66 year old female with history of loculated pleural effusion with chest tube in place. EXAM: CT CHEST WITHOUT CONTRAST TECHNIQUE: Multidetector CT imaging of the chest was performed following the standard protocol without IV contrast. COMPARISON:  Chest CT 02/19/2015. Multiple recent prior chest radiographs. FINDINGS: Cardiovascular: Heart size is normal. There is no significant pericardial fluid, thickening or pericardial calcification. There is aortic atherosclerosis, as well as atherosclerosis of the great vessels of the mediastinum and the coronary arteries, including calcified atherosclerotic plaque in the left main and left anterior descending coronary arteries. Left internal jugular central venous catheter with tip terminating in the superior aspect of the right atrium. Mediastinum/Nodes: Patient is intubated, with the tip of the endotracheal to the lying approximately 2.1 cm above the carina. Nasogastric tube extends into the stomach. Numerous borderline enlarged and mildly enlarged mediastinal and bilateral hilar lymph nodes, measuring up to 11 mm in short axis in the subcarinal nodal station. Esophagus is unremarkable in appearance. No axillary lymphadenopathy. Lungs/Pleura: Right-sided chest tube in position with tip directed into the medial aspect of the apex of  the right hemithorax. Large complex right-sided pleural fluid and gas collection. Fluid ranges from low to intermediate attenuation, suggesting proteinaceous contents. Some of the pneumothorax component is confluent anteriorly, while other locules of gas are noted throughout the lower right hemithorax, some of which appear to be within collapsed/consolidated lung tissue, but other small locules of gas  likely are present within loculated pleural fluid in the lower right hemithorax. In the medial aspect of the lower right hemithorax there is what appears to be a thick-walled cavitary area, which is favored to be within the lung parenchyma rather than in the pleural space, measuring up to 6.4 x 2.4 x 6.5 cm (axial image 83 of series 201, and coronal image 56 of series 203). Areas of bronchiectasis are noted within the right lower lobe. Within the aerated portions of the lungs there is patchy multifocal ground-glass attenuation and septal thickening. The ground-glass attenuation is most evident in a peribronchovascular distribution, likely infectious/inflammatory in etiology, most severe in the left upper lobe. No significant left pleural effusion. Upper Abdomen: Unremarkable. Musculoskeletal: Extensive emphysema is noted throughout the subcutaneous fat of the chest wall bilaterally extending up into the cervical regions bilaterally (right greater than left). There are no aggressive appearing lytic or blastic lesions noted in the visualized portions of the skeleton. IMPRESSION: 1. Findings are most compatible with right lower lobe necrotizing pneumonia, with what appears to be a large right lower lobe cavity in the medial aspect of the basal segments of the right lower lobe, as well as a large right empyema. In this complex right pleural gas and fluid collections there both a small anterior pneumothorax, as well as multifocal loculated components of gas within the inferior aspect of the right pleural space, as above. Right-sided chest tube is directed into the medial aspect of the right apex, and the tip of the chest tube is currently surrounded by very little pleural fluid or gas. 2. Multifocal patchy interstitial and airspace disease asymmetrically distributed throughout the remaining aerated portions of the lungs, favored to reflect severe multilobar bronchopneumonia. 3. Aortic atherosclerosis, in addition to left  main and left anterior descending coronary artery disease. Please note that although the presence of coronary artery calcium documents the presence of coronary artery disease, the severity of this disease and any potential stenosis cannot be assessed on this non-gated CT examination. Assessment for potential risk factor modification, dietary therapy or pharmacologic therapy may be warranted, if clinically indicated. 4. Support apparatus, as above. Electronically Signed   By: Vinnie Langton M.D.   On: 11/13/2016 15:19   Dg Chest Port 1 View  Result Date: 11/15/2016 CLINICAL DATA:  Respiratory failure. EXAM: PORTABLE CHEST 1 VIEW COMPARISON:  11/14/2016 CT 11/13/2016 . FINDINGS: Endotracheal tube, NG tube, left IJ line, right chest tube in stable position. No definite pneumothorax identified. Previously identified anterior pneumothorax best identified on CT of 11/13/2016. Persistent basilar infiltrates, particularly prominent on the right. Persistent prominent right sided pleural effusions/possible empyema again noted. No change. IMPRESSION: 1. Lines and tubes including right chest tube in stable position. No pneumothorax identified. Previously identified anterior pneumothorax best demonstrated by prior CT of 11/13/2016. 2. Persistent loculated right pleural effusion/empyema. No acute change. Persistent basilar infiltrates, particular prominent on the right. No change. Electronically Signed   By: Marcello Moores  Register   On: 11/15/2016 06:46   Dg Chest Port 1 View  Result Date: 11/14/2016 CLINICAL DATA:  Respiratory failure. EXAM: PORTABLE CHEST 1 VIEW COMPARISON:  CT 11/13/2016.  Chest x-ray 11/13/2016.  FINDINGS: Endotracheal tube, NG tube, left IJ line, right chest tube stable position. Heart size stable. Bilateral pulmonary infiltrates with right side pleural effusion/ possible empyema again noted. Previously identified anterior pneumothorax is best demonstrated by CT of 11/13/2016. Mild chest wall subcutaneous  emphysema again noted. IMPRESSION: 1. Lines and tubes including right chest tube in stable position. 2. Persistent bibasilar infiltrates. Persistent right sided prominent pleural effusion/ possible empyema again noted without significant change. Previously identified anterior pneumothorax best demonstrated about recent CT. No definite pneumothorax noted on this chest x-ray. Electronically Signed   By: Marcello Moores  Register   On: 11/14/2016 07:04     DISCUSSION: 66 year old female with COPD admitted 3/18 with sepsis secondary to pneumococcal pneumonia. Course complicated by pneumothorax, hypernatremia & now a loculated rt empyema  ASSESSMENT / PLAN:  PULMONARY A: Acute hypoxemic respiratory failure secondary to pneumonia complicated by likely aspiration. New right PTX 3/22 - likely to worsen given high PEEP. Pleural effusion right side > eval by bedside US, too small to drain.  COPD exacerbation on admission. RLL necrotizing CAP.  P:   SBTs as tolerated Chest tube to suction  VAP prevention orders ct scheduled bronchodilators VATS planned 3/28    CARDIOVASCULAR A:  Hypotension/shock - hypovolemia and  sepsis.  Pressors turned off AM 3/22 and remain off. H/o HTN - now back on hypertensive side 3/23. P:  Labetalol as needed Ct scheduled metoprolol 3/25 per  home regimen with BP parameters  RENAL A:   AKI - secondary to hypoperfusion, NSAIDS, and ACEi.  Gradually improving. Hypokalemia - s/p repletion AM 3/23. Volume overload - improving Hypernatremia -resolved P:   Correct electrolytes as indicated Resume Lasix on return frmo OR ct free water 400 q 4h, dc  D5W  GASTROINTESTINAL A:   Nutrition. GI prophylaxis. P:   Resume Tfs after OR Continue Protonix  HEMATOLOGIC A:   Anemia. VTE prophylaxis. P:  Transfusion threshold hemoglobin greater than 7   DVT prophylaxis   INFECTIOUS A:   Septic shock secondary to probable aspiration +/- CAP. ? Empyema P:   Continue  unasyn   ENDOCRINE A:   Hyperglycemia, on d5W , off steroids. P:   Continue SSI, Lantus 25 bid - am dose held while npo   NEUROLOGIC A:   Acute metabolic encephalopathy: sepsis and acidosis; resolved 3/23. P:   RASS goal: -1 Continue fentanyl and Versed as needed Daily wakeup assessment.    FAMILY  - Updates: Updated daughter   - Inter-disciplinary family meet or Palliative Care meeting due by: 3/25  Independent critical care time is 32 minutes.     Kara Mead MD. Shade Flood. [facility] Pager [phone] If no response call [phone]    11/15/2016, 10:15 AM

## 2016-11-15 NOTE — Anesthesia Procedure Notes (Signed)
Procedure Name: Intubation Date/Time: 11/15/2016 4:36 PM Performed by: Roberts Gaudy Pre-anesthesia Checklist: Patient identified, Emergency Drugs available, Suction available, Patient being monitored and Timeout performed Patient Re-evaluated:Patient Re-evaluated prior to inductionPreoxygenation: Pre-oxygenation with 100% oxygen Intubation Type: Inhalational induction with existing ETT Laryngoscope Size: Glidescope and 3 Grade View: Grade I Tube type: Subglottic suction tube Tube size: 7.5 mm Intubation method: DLT switched to OETT via cook catheter under direct visualization via glidescope. Placement Confirmation: ETT inserted through vocal cords under direct vision,  positive ETCO2 and breath sounds checked- equal and bilateral Secured at: 21 cm Tube secured with: Tape

## 2016-11-15 NOTE — Progress Notes (Signed)
Per anesthesia post op- place on 100% fio2 for now.  Confirmed w/ CCM Dr Mortimer Fries- continue VT at [place] s/p RLL lobectomy

## 2016-11-15 NOTE — Progress Notes (Signed)
Pt off the unit in OR at this time. No Vent check completed at this time. RT will continue to monitor.

## 2016-11-15 NOTE — OR Nursing (Signed)
[phone]  Called and given update.

## 2016-11-15 NOTE — OR Nursing (Signed)
1646 Rolling call made to 52M

## 2016-11-15 NOTE — Care Management Note (Signed)
Case Management Note  Patient Details  Name: Diana Gardner MRN: [id number] Date of Birth: [birth year]  Subjective/Objective:    Pt admitted with pleural effusions                Action/Plan:  Patient from home with base line COPD and is 2-3 liter O2 dependent at home on combivent and albuterol.  Pt is now ventilated - no family at bedside    Expected Discharge Date:                  Expected Discharge Plan:     In-House Referral:     Discharge planning Services  CM Consult  Post Acute Care Choice:    Choice offered to:     DME Arranged:    DME Agency:     HH Arranged:    HH Agency:     Status of Service:  In process, will continue to follow  If discussed at Long Length of Stay Meetings, dates discussed:    Additional Comments: Pt went for VATS procedure today Maryclare Labrador, RN 11/15/2016, 3:04 PM

## 2016-11-15 NOTE — Anesthesia Postprocedure Evaluation (Signed)
Anesthesia Post Note  Patient: Diana Gardner  Procedure(s) Performed: Procedure(s) (LRB): VIDEO BRONCHOSCOPY (N/A) VIDEO ASSISTED THORACOSCOPY (VATS), MINI THORACOTOMY, DRAINAGE OF EMPYEMA, DECORTICATION, RIGHT LOWER LOBE RESECTION (Right)  Anesthesia Type: General       Last Vitals:  Vitals:   11/15/16 2000 11/15/16 2100  BP: 129/63 (!) 116/51  Pulse: 68 85  Resp: 19 16  Temp: (!) 35.4 C     Last Pain:  Vitals:   11/15/16 2000  TempSrc: Rectal  PainSc:                  , COKER

## 2016-11-15 NOTE — Transfer of Care (Signed)
Immediate Anesthesia Transfer of Care Note  Patient: Farrel Conners  Procedure(s) Performed: Procedure(s): VIDEO BRONCHOSCOPY (N/A) VIDEO ASSISTED THORACOSCOPY (VATS), MINI THORACOTOMY, DRAINAGE OF EMPYEMA, DECORTICATION, RIGHT LOWER LOBE RESECTION (Right)  Patient Location: ICU  Anesthesia Type:General  Level of Consciousness: Patient remains intubated per anesthesia plan  Airway & Oxygen Therapy: Patient remains intubated per anesthesia plan and Patient placed on Ventilator (see vital sign flow sheet for setting)  Post-op Assessment: Report given to RN and Post -op Vital signs reviewed and stable  Post vital signs: Reviewed and stable  Last Vitals:  Vitals:   11/15/16 1100 11/15/16 1134  BP: 134/65 134/65  Pulse: 80 79  Resp: (!) 25 (!) 27  Temp:      Last Pain:  Vitals:   11/15/16 0743  TempSrc: Oral  PainSc:       Patients Stated Pain Goal: 0 (85/92/92 4462)  Complications: No apparent anesthesia complications

## 2016-11-15 NOTE — OR Nursing (Signed)
1225 to 1227 bronchoscopy performed per Dr. Roxan Hockey.

## 2016-11-15 NOTE — Brief Op Note (Addendum)
11/05/2016 - 11/15/2016  4:13 PM  PATIENT:  Diana Gardner  66 y.o. female  PRE-OPERATIVE DIAGNOSIS:  RIGHT EMPYEMA   POST-OPERATIVE DIAGNOSIS:  RIGHT EMPYEMA/ Necrotic right lower lobe  PROCEDURE:  BRONCHOSCOPY RIGHT VATS/ MINI THORRACOTOMY DRAINAGE OF EMPYEMA AND DECORTICATION RIGHT LOWER LOBECTOMY  SURGEON:  Surgeon(s) and Role:    * Melrose Nakayama, MD - Primary  PHYSICIAN ASSISTANT: WAYNE GOLD PA-C  ANESTHESIA:   general  EBL:  Total I/O In: 2982 [I.V.:1090; Blood:1292; IV Piggyback:600] Out: 2485 [Urine:485; Blood:2000]  BLOOD ADMINISTERED:4 UNITS OF PRBC'S  DRAINS: 3 CHEST TUBES IN RIGHT HEMITHORAX   LOCAL MEDICATIONS USED:  NONE  SPECIMEN:  Source of Specimen:  RLL AND PLEURAL EXUDATE FOR CULTURES  DISPOSITION OF SPECIMEN:  PATH/MICRO  COUNTS:  YES  PLAN OF CARE: Admit to inpatient   PATIENT DISPOSITION:  ICU - intubated and hemodynamically stable.   Delay start of Pharmacological VTE agent (>24hrs) due to surgical blood loss or risk of bleeding: yes  COMPLICATIONS: NO KNOWN  All basilar segments of right lower lobe necrotic.

## 2016-11-16 ENCOUNTER — Encounter (HOSPITAL_COMMUNITY): Payer: Self-pay | Admitting: Thoracic Surgery (Cardiothoracic Vascular Surgery)

## 2016-11-16 ENCOUNTER — Inpatient Hospital Stay (HOSPITAL_COMMUNITY): Payer: Medicare Other

## 2016-11-16 LAB — GLUCOSE, CAPILLARY
GLUCOSE-CAPILLARY: 174 mg/dL — AB (ref 65–99)
GLUCOSE-CAPILLARY: 182 mg/dL — AB (ref 65–99)
GLUCOSE-CAPILLARY: 187 mg/dL — AB (ref 65–99)
GLUCOSE-CAPILLARY: 195 mg/dL — AB (ref 65–99)
Glucose-Capillary: 226 mg/dL — ABNORMAL HIGH (ref 65–99)
Glucose-Capillary: 139 mg/dL — ABNORMAL HIGH (ref 65–99)
Glucose-Capillary: 96 mg/dL (ref 65–99)
Glucose-Capillary: 141 mg/dL — ABNORMAL HIGH (ref 65–99)

## 2016-11-16 LAB — POCT I-STAT 3, ART BLOOD GAS (G3+)
Acid-Base Excess: 2 mmol/L (ref 0.0–2.0)
BICARBONATE: 27.4 mmol/L (ref 20.0–28.0)
O2 Saturation: 98 %
TCO2: 29 mmol/L (ref 0–100)
pCO2 arterial: 46.2 mmHg (ref 32.0–48.0)
pH, Arterial: 7.381 (ref 7.350–7.450)
pO2, Arterial: 105 mmHg (ref 83.0–108.0)

## 2016-11-16 LAB — BLOOD GAS, ARTERIAL
Acid-Base Excess: 0.9 mmol/L (ref 0.0–2.0)
Bicarbonate: 25.5 mmol/L (ref 20.0–28.0)
DRAWN BY: 345601
FIO2: 60
MECHVT: 470 mL
O2 Saturation: 99.6 %
PATIENT TEMPERATURE: 98.6
PEEP: 5 cmH2O
PO2 ART: 270 mmHg — AB (ref 83.0–108.0)
RATE: 12 resp/min
pCO2 arterial: 44.5 mmHg (ref 32.0–48.0)
pH, Arterial: 7.376 (ref 7.350–7.450)

## 2016-11-16 LAB — BASIC METABOLIC PANEL
Anion gap: 6 (ref 5–15)
BUN: 36 mg/dL — AB (ref 6–20)
CALCIUM: 7.3 mg/dL — AB (ref 8.9–10.3)
CO2: 27 mmol/L (ref 22–32)
Chloride: 110 mmol/L (ref 101–111)
Creatinine, Ser: 0.82 mg/dL (ref 0.44–1.00)
GFR calc Af Amer: >60 mL/min (ref >60)
GFR calc non Af Amer: >60 mL/min (ref >60)
Glucose, Bld: 169 mg/dL — ABNORMAL HIGH (ref 65–99)
Potassium: 3.7 mmol/L (ref 3.5–5.1)
Sodium: 143 mmol/L (ref 135–145)

## 2016-11-16 LAB — CBC
HEMATOCRIT: 25.7 % — AB (ref 36.0–46.0)
Hemoglobin: 8.3 g/dL — ABNORMAL LOW (ref 12.0–15.0)
MCH: 27.3 pg (ref 26.0–34.0)
MCHC: 32.3 g/dL (ref 30.0–36.0)
MCV: 84.5 fL (ref 78.0–100.0)
Platelets: 305 10*3/uL (ref 150–400)
RBC: 3.04 MIL/uL — ABNORMAL LOW (ref 3.87–5.11)
RDW: 17.1 % — ABNORMAL HIGH (ref 11.5–15.5)
WBC: 11.2 10*3/uL — ABNORMAL HIGH (ref 4.0–10.5)

## 2016-11-16 LAB — POCT I-STAT 7, (LYTES, BLD GAS, ICA,H+H)
Acid-Base Excess: 1 mmol/L (ref 0.0–2.0)
BICARBONATE: 27.6 mmol/L (ref 20.0–28.0)
Calcium, Ion: 1.13 mmol/L — ABNORMAL LOW (ref 1.15–1.40)
HCT: 30 % — ABNORMAL LOW (ref 36.0–46.0)
Hemoglobin: 10.2 g/dL — ABNORMAL LOW (ref 12.0–15.0)
O2 Saturation: 99 %
PH ART: 7.357 (ref 7.350–7.450)
PO2 ART: 139 mmHg — AB (ref 83.0–108.0)
Potassium: 4.1 mmol/L (ref 3.5–5.1)
Sodium: 149 mmol/L — ABNORMAL HIGH (ref 135–145)
TCO2: 29 mmol/L (ref 0–100)
pCO2 arterial: 48.3 mmHg — ABNORMAL HIGH (ref 32.0–48.0)

## 2016-11-16 LAB — MAGNESIUM: Magnesium: 1.6 mg/dL — ABNORMAL LOW (ref 1.7–2.4)

## 2016-11-16 LAB — PHOSPHORUS: Phosphorus: 4.4 mg/dL (ref 2.5–4.6)

## 2016-11-16 MED ORDER — FREE WATER
200.0000 mL | Freq: Four times a day (QID) | Status: DC
  Administered 2016-11-16 – 2016-11-17 (×4): 200 mL

## 2016-11-16 MED ORDER — SODIUM CHLORIDE 0.45 % IV SOLN
INTRAVENOUS | Status: DC
  Administered 2016-11-16: 10:00:00 via INTRAVENOUS

## 2016-11-16 MED ORDER — FUROSEMIDE 10 MG/ML IJ SOLN
20.0000 mg | Freq: Every day | INTRAMUSCULAR | Status: DC
  Administered 2016-11-16 – 2016-11-17 (×2): 20 mg via INTRAVENOUS
  Filled 2016-11-16 (×2): qty 2

## 2016-11-16 MED ORDER — JEVITY 1.2 CAL PO LIQD
1000.0000 mL | ORAL | Status: DC
  Administered 2016-11-16: 50 mL/h
  Administered 2016-11-17: 1000 mL
  Filled 2016-11-16 (×3): qty 1000

## 2016-11-16 MED ORDER — CHLORHEXIDINE GLUCONATE CLOTH 2 % EX PADS
6.0000 | MEDICATED_PAD | Freq: Every day | CUTANEOUS | Status: DC
  Administered 2016-11-17 – 2016-11-22 (×6): 6 via TOPICAL

## 2016-11-16 MED ORDER — INSULIN GLARGINE 100 UNIT/ML ~~LOC~~ SOLN
25.0000 [IU] | Freq: Two times a day (BID) | SUBCUTANEOUS | Status: DC
  Administered 2016-11-16 – 2016-11-19 (×8): 25 [IU] via SUBCUTANEOUS
  Filled 2016-11-16 (×9): qty 0.25

## 2016-11-16 NOTE — Progress Notes (Addendum)
[facility] DAILY ICU PROGRESS NOTE                   [address]            [place] [address]          [phone]   1 Day Post-Op Procedure(s) (LRB): VIDEO BRONCHOSCOPY (N/A) VIDEO ASSISTED THORACOSCOPY (VATS), MINI THORACOTOMY, DRAINAGE OF EMPYEMA, DECORTICATION, RIGHT LOWER LOBE RESECTION (Right)  Total Length of Stay:  LOS: 11 days   Subjective: Remarkably alert, some pain  Objective: Vital signs in last 24 hours: Temp:  [95.7 F (35.4 C)-97.4 F (36.3 C)] 97.4 F (36.3 C) (03/29 0330) Pulse Rate:  [43-89] 69 (03/29 0700) Cardiac Rhythm: Sinus bradycardia;Normal sinus rhythm (03/29 0400) Resp:  [11-31] 11 (03/29 0700) BP: (96-153)/(41-103) 96/49 (03/29 0700) SpO2:  [92 %-100 %] 100 % (03/29 0700) Arterial Line BP: (100-172)/(38-80) 119/43 (03/29 0700) FiO2 (%):  [40 %-100 %] 40 % (03/29 0501) Weight:  [169 lb 15.6 oz (77.1 kg)] 169 lb 15.6 oz (77.1 kg) (03/29 0501)  Filed Weights   11/14/16 0426 11/15/16 0436 11/16/16 0501  Weight: 153 lb 7 oz (69.6 kg) 161 lb 9.6 oz (73.3 kg) 169 lb 15.6 oz (77.1 kg)    Weight change: 8 lb 6 oz (3.8 kg)   Hemodynamic parameters for last 24 hours:     Vent Mode: PRVC FiO2 (%):  [40 %-100 %] 40 % Set Rate:  [12 bmp] 12 bmp Vt Set:  [470 mL] 470 mL PEEP:  [5 cmH20] 5 cmH20 Pressure Support:  [10 cmH20] 10 cmH20 Plateau Pressure:  [18 cmH20-24 cmH20] 21 cmH20  Intake/Output from previous day: 03/28 0701 - 03/29 0700 In: 5427 [I.V.:2715; CWCBJ:6283; NG/GT:800; IV Piggyback:1250] Out: 3535 [Urine:935; Blood:2000; Chest Tube:600]  Intake/Output this shift: No intake/output data recorded.  Current Meds: Scheduled Meds: . acetaminophen  1,000 mg Oral Q6H   Or  . acetaminophen (TYLENOL) oral liquid 160 mg/5 mL  1,000 mg Oral Q6H  . ampicillin-sulbactam (UNASYN) IV  3 g Intravenous Q8H  . atorvastatin  20 mg Per Tube Daily  . bisacodyl  10 mg Oral Daily  . chlorhexidine gluconate (MEDLINE KIT)  15 mL Mouth  Rinse BID  . free water  400 mL Per Tube Q8H  . insulin aspart  0-9 Units Subcutaneous Q4H  . ipratropium-albuterol  3 mL Inhalation QID  . mouth rinse  15 mL Mouth Rinse QID  . methylPREDNISolone (SOLU-MEDROL) injection  40 mg Intravenous Daily  . metoprolol tartrate  100 mg Per Tube BID  . pantoprazole sodium  40 mg Per Tube Daily  . senna-docusate  1 tablet Oral QHS   Continuous Infusions: . dextrose 5 % and 0.9% NaCl 125 mL/hr (11/16/16 0418)   PRN Meds:.albuterol, fentaNYL (SUBLIMAZE) injection, labetalol, midazolam, ondansetron (ZOFRAN) IV, potassium chloride (KCL MULTIRUN) 30 mEq in 265 mL IVPB  General appearance: alert, cooperative and no distress Neurologic: intact Heart: regular rate and rhythm Lungs: sume upper airway congestion Abdomen: soft, non-tender Extremities: + edema Wound: dressings intact  Lab Results: CBC: Recent Labs  11/15/16 0444  11/15/16 1605 11/16/16 0430  WBC 11.3*  --   --  11.2*  HGB 7.5*  < > 10.2* 8.3*  HCT 24.3*  < > 30.0* 25.7*  PLT 402*  --   --  305  < > = values in this interval not displayed. BMET:  Recent Labs  11/14/16 1714 11/15/16 0444 11/15/16 1405 11/15/16 1512 11/15/16 1605  NA 143 145 144 144 149*  K 3.9 3.7 3.7 4.0 4.1  CL 107 105  --   --   --   CO2 29 31  --   --   --   GLUCOSE 221* 96 155* 157*  --   BUN 36* 38*  --   --   --   CREATININE 0.73 0.67  --   --   --   CALCIUM 7.5* 7.8*  --   --   --     CMET: Lab Results  Component Value Date   WBC 11.2 (H) 11/16/2016   HGB 8.3 (L) 11/16/2016   HCT 25.7 (L) 11/16/2016   PLT 305 11/16/2016   GLUCOSE 157 (H) 11/15/2016   ALT 35 11/14/2016   AST 36 11/14/2016   NA 149 (H) 11/15/2016   K 4.1 11/15/2016   CL 105 11/15/2016   CREATININE 0.67 11/15/2016   BUN 38 (H) 11/15/2016   CO2 31 11/15/2016   INR 1.26 11/14/2016    ABG    Component Value Date/Time   PHART 7.376 11/16/2016 0323   PCO2ART 44.5 11/16/2016 0323   PO2ART 270 (H) 11/16/2016 0323    HCO3 25.5 11/16/2016 0323   TCO2 29 11/15/2016 1605   ACIDBASEDEF 12.0 (H) 11/07/2016 2005   O2SAT 99.6 11/16/2016 0323   PT/INR:  Recent Labs  11/14/16 1714  LABPROT 15.9*  INR 1.26   Radiology: Dg Chest Port 1 View  Result Date: 11/16/2016 CLINICAL DATA:  Acute respiratory failure EXAM: PORTABLE CHEST 1 VIEW COMPARISON:  11/15/2016 FINDINGS: Cardiac shadow remains mildly enlarged. Endotracheal tube, nasogastric catheter and left jugular central line are again seen. Multiple right chest tubes are again noted and stable. No definitive pneumothorax is seen. Bibasilar infiltrative changes are noted right greater than left. The changes on the left has increased slightly in the interval from the prior exam. IMPRESSION: Postsurgical changes. No pneumothorax is seen. Bibasilar infiltrative changes are seen. Electronically Signed   By: Inez Catalina M.D.   On: 11/16/2016 07:34   Dg Chest Port 1 View  Result Date: 11/15/2016 CLINICAL DATA:  Right lower lobe lobectomy for pulmonary abscess/empyema EXAM: PORTABLE CHEST 1 VIEW COMPARISON:  11/15/2016 FINDINGS: The heart size and mediastinal contours are within normal limits. Endotracheal tube tip is slightly low lying at 2.4 cm above the carina. Pullback approximately 1 cm recommended. Left IJ central line catheter is noted at the cavoatrial junction. Two right-sided chest tubes are identified projecting over the lung apex with decrease in previously noted loculated pleural opacity along the periphery of the right hemithorax. Bibasilar atelectasis is identified. No significant pneumothorax. Gastric tube extends below the left hemidiaphragm into the expected location the stomach. The tip is excluded however. No acute osseous abnormality appear IMPRESSION: 1. Slightly low-lying endotracheal tube tip approximately 2.4 cm above the carina. Pullback approximately 1 cm recommended. Otherwise, satisfactory support line and tube positions. These results will be called  to the ordering clinician or representative by the Radiologist Assistant, and communication documented in the PACS or zVision Dashboard. 2. Residual atelectasis at the right lung base without appreciable pneumothorax status post right lower lobectomy. Electronically Signed   By: Ashley Royalty M.D.   On: 11/15/2016 19:57     Assessment/Plan: S/P Procedure(s) (LRB): VIDEO BRONCHOSCOPY (N/A) VIDEO ASSISTED THORACOSCOPY (VATS), MINI THORACOTOMY, DRAINAGE OF EMPYEMA, DECORTICATION, RIGHT LOWER LOBE RESECTION (Right)   1 stable on vent, CCM to manage vent wean 2 cont current abx 3 H/H is  stable- did require 4 units in OR, preop HCT was low 4 hemodyn stable 5 CT drainage- 600 since OR- keep tubes in place 6 hemodyn stable, brady rate at times 7 CBG's a bit too high will change IV fluids and reduce rate GOLD,WAYNE E 11/16/2016 7:50 AM   Patient seen and examined, agree with above Hgb above preop baseline after transfusion CXR looks good Will keep all CT in and on suction today Hopefully can wean from vent  Gardena C. Roxan Hockey, MD [facility] [phone]

## 2016-11-16 NOTE — Progress Notes (Signed)
Name: Diana Gardner MRN: [id number] DOB: September 04, 1950    ADMISSION DATE:  11/05/2016 CONSULTATION DATE:  11/06/2016  REFERRING MD :  [facility] - El-Mahi  CHIEF COMPLAINT:  Pneumonia, COPD and pleural effusion  BRIEF PATIENT DESCRIPTION: 67 year old never smoker with COPD due to second hand smoke exposure adm 3/18  with the fever and productive cough.  Found to have an infiltrate on CXR, determined to be CAP.  There was a concern for pleural effusion and PCCM was consulted for empyema.  Patient has base line COPD and is 2-3 liter O2 dependent at home on combivent and albuterol.  SIGNIFICANT EVENTS  11/05/2016 admission for PNA 3/20 transfer to ICU and intubated 3/21 refractory shock 3/22 > right PTX > chest tube placed 3/28 >> RT VATS ,right mini thoracotomy, drainage of empyema, decortication, right lower lobectomy.   STUDIES:  3/21 2 d >> EF 65-70%, G1DD  CULTURES: Sputum 3/20 > neg 3/21 bc x 2>>ng 3/21 RVP>> neg u strep ag POS  ANTIBIOTICS: Azithromycin 3/18 > 3/18 Ceftriaxone 3/18 > 3/21 Vanc 3/21 >  3/21 Unasyn 3/21 >   LINES/TUBES: ETT 3/20 > 3/20 cvl>> 3/20 rad aline>>3/25 3/28 radial a line (OR) >.>   SUBJECTIVE:   C/o pain rt chest Critically ill, intubated, weaning on 10/5 Afebrile   VITAL SIGNS: Temp:  [95.7 F (35.4 C)-97.5 F (36.4 C)] 97.5 F (36.4 C) (03/29 0753) Pulse Rate:  [57-89] 64 (03/29 1000) Resp:  [11-27] 19 (03/29 1000) BP: (88-153)/(41-103) 92/48 (03/29 1000) SpO2:  [92 %-100 %] 100 % (03/29 1000) Arterial Line BP: (53-172)/(38-80) 89/57 (03/29 1000) FiO2 (%):  [40 %-100 %] 40 % (03/29 0823) Weight:  [169 lb 15.6 oz (77.1 kg)] 169 lb 15.6 oz (77.1 kg) (03/29 0501)  PHYSICAL EXAMINATION: Gen: Intubated, calm, no distress HEENT:  ET tube in place, no oral lesions Cardiovascular:  Regular, tachycardic, no edema Lungs: decreased on rt, no rhonchi . Right sided chest tube -no air leak, serosang output Abdomen:  Soft, positive bowel  sounds Musculoskeletal:  No deformity Skin:  No rash   Recent Labs Lab 11/14/16 0929 11/14/16 1714 11/15/16 0444 11/15/16 1405 11/15/16 1512 11/15/16 1605  NA 145 143 145 144 144 149*  K 3.8 3.9 3.7 3.7 4.0 4.1  CL 109 107 105  --   --   --   CO2 29 29 31   --   --   --   BUN 35* 36* 38*  --   --   --   CREATININE 0.73 0.73 0.67  --   --   --   GLUCOSE 145* 221* 96 155* 157*  --     Recent Labs Lab 11/14/16 1714 11/15/16 0444  11/15/16 1512 11/15/16 1605 11/16/16 0430  HGB 7.8* 7.5*  < > 11.2* 10.2* 8.3*  HCT 25.7* 24.3*  < > 33.0* 30.0* 25.7*  WBC 12.5* 11.3*  --   --   --  11.2*  PLT 339 402*  --   --   --  305  < > = values in this interval not displayed. Dg Chest Port 1 View  Result Date: 11/16/2016 CLINICAL DATA:  Acute respiratory failure EXAM: PORTABLE CHEST 1 VIEW COMPARISON:  11/15/2016 FINDINGS: Cardiac shadow remains mildly enlarged. Endotracheal tube, nasogastric catheter and left jugular central line are again seen. Multiple right chest tubes are again noted and stable. No definitive pneumothorax is seen. Bibasilar infiltrative changes are noted right greater than left. The changes on the  left has increased slightly in the interval from the prior exam. IMPRESSION: Postsurgical changes. No pneumothorax is seen. Bibasilar infiltrative changes are seen. Electronically Signed   By: Inez Catalina M.D.   On: 11/16/2016 07:34   Dg Chest Port 1 View  Result Date: 11/15/2016 CLINICAL DATA:  Right lower lobe lobectomy for pulmonary abscess/empyema EXAM: PORTABLE CHEST 1 VIEW COMPARISON:  11/15/2016 FINDINGS: The heart size and mediastinal contours are within normal limits. Endotracheal tube tip is slightly low lying at 2.4 cm above the carina. Pullback approximately 1 cm recommended. Left IJ central line catheter is noted at the cavoatrial junction. Two right-sided chest tubes are identified projecting over the lung apex with decrease in previously noted loculated pleural  opacity along the periphery of the right hemithorax. Bibasilar atelectasis is identified. No significant pneumothorax. Gastric tube extends below the left hemidiaphragm into the expected location the stomach. The tip is excluded however. No acute osseous abnormality appear IMPRESSION: 1. Slightly low-lying endotracheal tube tip approximately 2.4 cm above the carina. Pullback approximately 1 cm recommended. Otherwise, satisfactory support line and tube positions. These results will be called to the ordering clinician or representative by the Radiologist Assistant, and communication documented in the PACS or zVision Dashboard. 2. Residual atelectasis at the right lung base without appreciable pneumothorax status post right lower lobectomy. Electronically Signed   By: Ashley Royalty M.D.   On: 11/15/2016 19:57   Dg Chest Port 1 View  Result Date: 11/15/2016 CLINICAL DATA:  Respiratory failure. EXAM: PORTABLE CHEST 1 VIEW COMPARISON:  11/14/2016 CT 11/13/2016 . FINDINGS: Endotracheal tube, NG tube, left IJ line, right chest tube in stable position. No definite pneumothorax identified. Previously identified anterior pneumothorax best identified on CT of 11/13/2016. Persistent basilar infiltrates, particularly prominent on the right. Persistent prominent right sided pleural effusions/possible empyema again noted. No change. IMPRESSION: 1. Lines and tubes including right chest tube in stable position. No pneumothorax identified. Previously identified anterior pneumothorax best demonstrated by prior CT of 11/13/2016. 2. Persistent loculated right pleural effusion/empyema. No acute change. Persistent basilar infiltrates, particular prominent on the right. No change. Electronically Signed   By: Marcello Moores  Register   On: 11/15/2016 06:46     DISCUSSION: 66 year old female with COPD admitted 3/18 with sepsis secondary to RLL necrotizing pneumococcal pneumonia. Course complicated by pneumothorax, hypernatremia &  rt empyema  s/p lobectomy/ decortication  ASSESSMENT / PLAN:  PULMONARY A: Acute hypoxemic respiratory failure secondary to pneumonia complicated by likely aspiration. Right PTX 3/22 COPD exacerbation on admission. RLL necrotizing CAP s/p lobectomy 3/28. Rt empyema s/p decortication  P:   SBTs as tolerated , hope to extubate soon Chest tube per TCTS VAP prevention orders ct scheduled bronchodilators   CARDIOVASCULAR A:  Hypotension/shock - hypovolemia and  sepsis.  Pressors turned off AM 3/22 and remain off. H/o HTN - now back on hypertensive side 3/23. P:  Labetalol as needed Ct scheduled metoprolol 3/25 per  home regimen with BP parameters  RENAL A:   AKI - secondary to hypoperfusion, NSAIDS, and ACEi.  Gradually improving. Hypokalemia - s/p repletion AM 3/23. Volume overload - improving Hypernatremia -resolved P:   Correct electrolytes as indicated Resume Lasix 20 daily & titrate ct free water 200 q 4h  GASTROINTESTINAL A:   Nutrition. GI prophylaxis. P:   Resume Tfs  Continue Protonix  HEMATOLOGIC A:   Anemia. VTE prophylaxis. P:  Transfusion threshold hemoglobin greater than 7   DVT prophylaxis   INFECTIOUS A:   Septic  shock secondary to probable aspiration +/- CAP. ? Empyema P:   Continue unasyn , plan 2-4 weeks on abx depending on clinical & radio improvement  ENDOCRINE A:   Hyperglycemia, on d5W , off steroids. P:   Continue SSI, Lantus 25 bid - am dose held while npo   NEUROLOGIC A:   Acute metabolic encephalopathy: sepsis and acidosis; resolved 3/23. P:   RASS goal: -1 Continue fentanyl gtt and Versed prn as needed Daily wakeup assessment.    FAMILY  - Updates: Updated daughter   - Inter-disciplinary family meet or Palliative Care meeting due by: 3/25  Independent critical care time is 32 minutes.     Kara Mead MD. Shade Flood. [facility] Pager [phone] If no response call [phone]    11/16/2016, 10:26  AM

## 2016-11-17 ENCOUNTER — Inpatient Hospital Stay (HOSPITAL_COMMUNITY): Payer: Medicare Other

## 2016-11-17 LAB — CULTURE, RESPIRATORY: CULTURE: NORMAL

## 2016-11-17 LAB — COMPREHENSIVE METABOLIC PANEL
ALBUMIN: 1.5 g/dL — AB (ref 3.5–5.0)
ALT: 20 U/L (ref 14–54)
AST: 18 U/L (ref 15–41)
Alkaline Phosphatase: 49 U/L (ref 38–126)
Anion gap: 7 (ref 5–15)
BUN: 30 mg/dL — AB (ref 6–20)
CO2: 28 mmol/L (ref 22–32)
CREATININE: 0.73 mg/dL (ref 0.44–1.00)
Calcium: 7.6 mg/dL — ABNORMAL LOW (ref 8.9–10.3)
Chloride: 110 mmol/L (ref 101–111)
GFR calc Af Amer: >60 mL/min (ref >60)
Glucose, Bld: 137 mg/dL — ABNORMAL HIGH (ref 65–99)
POTASSIUM: 3.2 mmol/L — AB (ref 3.5–5.1)
SODIUM: 145 mmol/L (ref 135–145)
Total Bilirubin: 0.3 mg/dL (ref 0.3–1.2)
Total Protein: 4.4 g/dL — ABNORMAL LOW (ref 6.5–8.1)

## 2016-11-17 LAB — CBC
HCT: 26.3 % — ABNORMAL LOW (ref 36.0–46.0)
Hemoglobin: 8.4 g/dL — ABNORMAL LOW (ref 12.0–15.0)
MCH: 27.8 pg (ref 26.0–34.0)
MCHC: 31.9 g/dL (ref 30.0–36.0)
MCV: 87.1 fL (ref 78.0–100.0)
PLATELETS: 388 10*3/uL (ref 150–400)
RBC: 3.02 MIL/uL — AB (ref 3.87–5.11)
RDW: 17.9 % — AB (ref 11.5–15.5)
WBC: 11.5 10*3/uL — AB (ref 4.0–10.5)

## 2016-11-17 LAB — GLUCOSE, CAPILLARY
GLUCOSE-CAPILLARY: 116 mg/dL — AB (ref 65–99)
GLUCOSE-CAPILLARY: 155 mg/dL — AB (ref 65–99)
GLUCOSE-CAPILLARY: 143 mg/dL — AB (ref 65–99)
Glucose-Capillary: 127 mg/dL — ABNORMAL HIGH (ref 65–99)
Glucose-Capillary: 139 mg/dL — ABNORMAL HIGH (ref 65–99)
Glucose-Capillary: 116 mg/dL — ABNORMAL HIGH (ref 65–99)

## 2016-11-17 LAB — CULTURE, RESPIRATORY W GRAM STAIN

## 2016-11-17 MED ORDER — FUROSEMIDE 10 MG/ML IJ SOLN
40.0000 mg | Freq: Three times a day (TID) | INTRAMUSCULAR | Status: DC
  Administered 2016-11-17 – 2016-11-19 (×7): 40 mg via INTRAVENOUS
  Filled 2016-11-17 (×9): qty 4

## 2016-11-17 MED ORDER — ENOXAPARIN SODIUM 40 MG/0.4ML ~~LOC~~ SOLN
40.0000 mg | SUBCUTANEOUS | Status: DC
  Administered 2016-11-17 – 2016-12-04 (×18): 40 mg via SUBCUTANEOUS
  Filled 2016-11-17 (×18): qty 0.4

## 2016-11-17 MED ORDER — POTASSIUM CHLORIDE 20 MEQ/15ML (10%) PO SOLN
40.0000 meq | ORAL | Status: AC
  Administered 2016-11-17 (×2): 40 meq
  Filled 2016-11-17 (×2): qty 30

## 2016-11-17 MED ORDER — PRO-STAT SUGAR FREE PO LIQD
30.0000 mL | Freq: Every day | ORAL | Status: DC
  Administered 2016-11-17 – 2016-11-20 (×4): 30 mL
  Filled 2016-11-17 (×5): qty 30

## 2016-11-17 MED ORDER — VITAL AF 1.2 CAL PO LIQD
1000.0000 mL | ORAL | Status: DC
  Administered 2016-11-17 – 2016-11-19 (×3): 1000 mL

## 2016-11-17 NOTE — Progress Notes (Signed)
[facility] Progress Note Patient Name: Diana Gardner DOB: [birth year] MRN: [id number]   Date of Service  11/17/2016  HPI/Events of Note  Hypokalemia  eICU Interventions  Potassium replaced     Intervention Category Intermediate Interventions: Electrolyte abnormality - evaluation and management  DETERDING,ELIZABETH 11/17/2016, 5:24 AM

## 2016-11-17 NOTE — Progress Notes (Signed)
Nutrition Follow-up   INTERVENTION:  Provide Vital AF 1.2 @ 45 ml/hr continuous with 30 ml Pro-stat once daily via PEG. This will provide 1396 kcal, 96 grams of protein, and 875 ml of water.    NUTRITION DIAGNOSIS:   Inadequate oral intake related to inability to eat as evidenced by NPO status.  ongoing  GOAL:   Patient will meet greater than or equal to 90% of their needs  Unmet (currently meeting 102% of estimated energy needs but only 74% of protein needs)  MONITOR:   Vent status, TF tolerance, Labs, I & O's  REASON FOR ASSESSMENT:   Consult Assessment of nutrition requirement/status (RD to order TF per discussion with CCM team)  ASSESSMENT:   66 year old female with COPD admitted 3/18 with sepsis secondary to pneumonia. She began to suffer respiratory failure and was started on BiPAP ultimately failing requiring transfer to ICU 3/20 where she was intubated and and started on vasoactive medications.   Patient is currently intubated on ventilator support MV: 9.3 L/min Temp (24hrs), Avg:97.7 F (36.5 C), Min:97.3 F (36.3 C), Max:98.3 F (36.8 C)  S/P VIDEO ASSISTED THORACOSCOPY (VATS), MINI THORACOTOMY, DRAINAGE OF EMPYEMA, DECORTICATION, RIGHT LOWER LOBE RESECTION (Right) on 3/28  Pt awake on vent at time of visit. RN reports that patient is being weaned, but still in significant pain. TF were held for surgery and pt was re-started at 1100 hr on 3/29 with Jevity 1.2 @ 50 ml/hr with 200 ml FWF q 6 hrs. RD re-consulted for TF management. Current TF regimen provides 1440 kcal, 67 grams of protein, and 1772 ml of water. Pt's weight is up ~ 30 lbs from 3/18. On lasix. Will stay on vent for now per MD due to high respiratory rate and edema.   Labs: low potassium, low calcium, elevated BUN, low hemoglobin, slightly low magnesium   Diet Order:  Diet NPO time specified   Skin:  Reviewed, no issues (closed right chest incision)  Last BM:  3/30  Height:   Ht Readings  from Last 1 Encounters:  11/07/16 5' 5.5" (1.664 m)    Weight:   Wt Readings from Last 1 Encounters:  11/17/16 173 lb 15.1 oz (78.9 kg)  11/05/2016 143 lb 15.4 oz (65.3 kg)- used this weight for calculations  Ideal Body Weight:  58 kg  BMI:  Body mass index is 28.51 kg/m.  Estimated Nutritional Needs:   Kcal:  1409  Protein:  90-105 gm  Fluid:  1.8-2 L  EDUCATION NEEDS:   No education needs identified at this time  Scarlette Ar RD, LDN, CSP Inpatient Clinical Dietitian Pager: [phone] After Hours Pager: [phone]

## 2016-11-17 NOTE — Progress Notes (Signed)
2 Days Post-Op Procedure(s) (LRB): VIDEO BRONCHOSCOPY (N/A) VIDEO ASSISTED THORACOSCOPY (VATS), MINI THORACOTOMY, DRAINAGE OF EMPYEMA, DECORTICATION, RIGHT LOWER LOBE RESECTION (Right) Subjective: c/o pain from tubes  Objective: Vital signs in last 24 hours: Temp:  [97.3 F (36.3 C)-98.3 F (36.8 C)] 97.3 F (36.3 C) (03/30 0325) Pulse Rate:  [38-91] 79 (03/30 0600) Cardiac Rhythm: Normal sinus rhythm (03/30 0400) Resp:  [12-29] 16 (03/30 0600) BP: (76-126)/(42-67) 111/49 (03/30 0600) SpO2:  [99 %-100 %] 100 % (03/30 0600) Arterial Line BP: (53-118)/(33-57) 115/33 (03/29 1300) FiO2 (%):  [40 %] 40 % (03/30 0330) Weight:  [173 lb 15.1 oz (78.9 kg)] 173 lb 15.1 oz (78.9 kg) (03/30 0500)  Hemodynamic parameters for last 24 hours:    Intake/Output from previous day: 03/29 0701 - 03/30 0700 In: 2525 [I.V.:285; NG/GT:1860; IV Piggyback:300] Out: 1820 [Urine:1070; Chest Tube:750] Intake/Output this shift: No intake/output data recorded.  General appearance: alert and cooperative Neurologic: intact Heart: regular rate and rhythm Lungs: clear to auscultation bilaterally and no wheezing no air leak  Lab Results:  Recent Labs  11/16/16 0430 11/17/16 0435  WBC 11.2* 11.5*  HGB 8.3* 8.4*  HCT 25.7* 26.3*  PLT 305 388   BMET:  Recent Labs  11/16/16 0430 11/17/16 0435  NA 143 145  K 3.7 3.2*  CL 110 110  CO2 27 28  GLUCOSE 169* 137*  BUN 36* 30*  CREATININE 0.82 0.73  CALCIUM 7.3* 7.6*    PT/INR:  Recent Labs  11/14/16 1714  LABPROT 15.9*  INR 1.26   ABG    Component Value Date/Time   PHART 7.381 11/16/2016 1139   HCO3 27.4 11/16/2016 1139   TCO2 29 11/16/2016 1139   ACIDBASEDEF 12.0 (H) 11/07/2016 2005   O2SAT 98.0 11/16/2016 1139   CBG (last 3)   Recent Labs  11/16/16 1944 11/16/16 2337 11/17/16 0326  GLUCAP 174* 182* 116*    Assessment/Plan: S/P Procedure(s) (LRB): VIDEO BRONCHOSCOPY (N/A) VIDEO ASSISTED THORACOSCOPY (VATS), MINI  THORACOTOMY, DRAINAGE OF EMPYEMA, DECORTICATION, RIGHT LOWER LOBE RESECTION (Right) -  Lungs sound better today- hopefully ca wean from vent No air leak but still with significant drainage from CT- keep on suction for now On unasyn for pneumonia/ empyema SCD for DVT prophylaxis- will add lovenox as well    LOS: 12 days    Melrose Nakayama 11/17/2016

## 2016-11-17 NOTE — Progress Notes (Signed)
Lungs sounds are more pronounced, rube, rhonchi. MD notified. See new orders.

## 2016-11-17 NOTE — Progress Notes (Signed)
Name: Diana Gardner MRN: [id number] DOB: Oct 16, 1950    ADMISSION DATE:  11/05/2016 CONSULTATION DATE:  11/06/2016  REFERRING MD :  [facility] - El-Mahi  CHIEF COMPLAINT:  Pneumonia, COPD and pleural effusion  BRIEF PATIENT DESCRIPTION: 66 year old never smoker with COPD due to second hand smoke exposure adm 3/18  with the fever and productive cough.  Found to have an infiltrate on CXR, determined to be CAP.  There was a concern for pleural effusion and PCCM was consulted for empyema.  Patient has base line COPD and is 2-3 liter O2 dependent at home on combivent and albuterol.  STUDIES:  3/21 2 d >> EF 65-70%, G1DD  CULTURES: Sputum 3/20 > neg 3/21 bc x 2>>ng 3/21 RVP>> neg u strep ag POS  ANTIBIOTICS: Azithromycin 3/18 > 3/18 Ceftriaxone 3/18 > 3/21 Vanc 3/21 >  3/21 Unasyn 3/21 >   LINES/TUBES: ETT 3/20 > 3/20 cvl>> 3/20 rad aline>>3/25 3/28 radial a line (OR) >.>   SIGNIFICANT EVENTS  11/05/2016 admission for PNA 3/20 transfer to ICU and intubated 3/21 refractory shock 3/22 > right PTX > chest tube placed 3/28 >> RT VATS ,right mini thoracotomy, drainage of empyema, decortication, right lower lobectomy. 3/29  - C/o pain rt chest Critically ill, intubated, weaning on 10/5 Afebrile   SUBJECTIVE/OVERNIGHT/INTERVAL HX 3/30 -  Weaning but tachypneic and needing higher support. On PRN sedation. Not on pressors. Afebrile  VITAL SIGNS: Temp:  [97.3 F (36.3 C)-98.3 F (36.8 C)] 97.8 F (36.6 C) (03/30 0752) Pulse Rate:  [38-91] 84 (03/30 0900) Resp:  [12-29] 27 (03/30 0900) BP: (76-126)/(42-67) 119/55 (03/30 0900) SpO2:  [99 %-100 %] 100 % (03/30 0900) Arterial Line BP: (115-118)/(33-48) 115/33 (03/29 1300) FiO2 (%):  [40 %] 40 % (03/30 0820) Weight:  [78.9 kg (173 lb 15.1 oz)] 78.9 kg (173 lb 15.1 oz) (03/30 0500)  PHYSICAL EXAMINATION:  General Appearance:    Looks weak and deconditoned3  Head:    Normocephalic, without obvious abnormality, atraumatic  Eyes:     PERRL - yes, conjunctiva/corneas - clear      Ears:    Normal external ear canals, both ears  Nose:   NG tube - no  Throat:  ETT TUBE - yes , OG tube - no  Neck:   Supple,  No enlargement/tenderness/nodules     Lungs:     Clear to auscultation bilaterally, Ventilator   Synchrony - yes but tachypneic on PSV 18/5  Chest wall:    No deformity  Heart:    S1 and S2 normal, no murmur, CVP - no.  Pressors - no  Abdomen:     Soft, no masses, no organomegaly  Genitalia:    Not done  Rectal:   not done  Extremities:   Extremities- edema +++      Skin:   Intact in exposed areas .      Neurologic:   Sedation - prn sedation -> RASS - +1 . Moves all 4s - yes. CAM-ICU - neg . Orientation - x 3+   PULMONARY  Recent Labs Lab 11/15/16 0310 11/15/16 1605 11/16/16 0323 11/16/16 1139  PHART 7.461* 7.357 7.376 7.381  PCO2ART 45.3 48.3* 44.5 46.2  PO2ART 83.0 139.0* 270* 105.0  HCO3 32.3* 27.6 25.5 27.4  TCO2 34 29  --  29  O2SAT 97.0 99.0 99.6 98.0    CBC  Recent Labs Lab 11/15/16 0444  11/15/16 1605 11/16/16 0430 11/17/16 0435  HGB 7.5*  < > 10.2*  8.3* 8.4*  HCT 24.3*  < > 30.0* 25.7* 26.3*  WBC 11.3*  --   --  11.2* 11.5*  PLT 402*  --   --  305 388  < > = values in this interval not displayed.  COAGULATION  Recent Labs Lab 11/14/16 1714  INR 1.26    CARDIAC  No results for input(s): TROPONINI in the last 168 hours. No results for input(s): PROBNP in the last 168 hours.   CHEMISTRY  Recent Labs Lab 11/10/16 2118 11/11/16 0347 11/12/16 0415  11/14/16 0929 11/14/16 1714 11/15/16 0444 11/15/16 1405 11/15/16 1512 11/15/16 1605 11/16/16 0430 11/17/16 0435  NA 156* 160* 161*  < > 145 143 145 144 144 149* 143 145  K 3.4* 4.1 2.9*  < > 3.8 3.9 3.7 3.7 4.0 4.1 3.7 3.2*  CL 107 113* 114*  < > 109 107 105  --   --   --  110 110  CO2 41* 37* 38*  < > 29 29 31   --   --   --  27 28  GLUCOSE 179* 177* 105*  < > 145* 221* 96 155* 157*  --  169* 137*  BUN 93* 86* 60*  <  > 35* 36* 38*  --   --   --  36* 30*  CREATININE 0.98 0.92 0.76  < > 0.73 0.73 0.67  --   --   --  0.82 0.73  CALCIUM 7.9* 7.9* 7.8*  < > 7.6* 7.5* 7.8*  --   --   --  7.3* 7.6*  MG 2.1 2.1 2.0  --   --   --   --   --   --   --  1.6*  --   PHOS  --  2.2* 3.0  --   --   --   --   --   --   --  4.4  --   < > = values in this interval not displayed. Estimated Creatinine Clearance: 71.6 mL/min (by C-G formula based on SCr of 0.73 mg/dL).   LIVER  Recent Labs Lab 11/14/16 1714 11/17/16 0435  AST 36 18  ALT 35 20  ALKPHOS 79 49  BILITOT 0.5 0.3  PROT 5.5* 4.4*  ALBUMIN 1.1* 1.5*  INR 1.26  --      INFECTIOUS No results for input(s): LATICACIDVEN, PROCALCITON in the last 168 hours.   ENDOCRINE CBG (last 3)   Recent Labs  11/16/16 2337 11/17/16 0326 11/17/16 0754  GLUCAP 182* 116* 127*         IMAGING x48h  - image(s) personally visualized  -   highlighted in bold Dg Chest Port 1 View  Result Date: 11/17/2016 CLINICAL DATA:  Respiratory failure EXAM: PORTABLE CHEST 1 VIEW COMPARISON:  11/16/2016 FINDINGS: Endotracheal tube, nasogastric catheter and left jugular central line are again seen and stable. Chest tubes are noted on the right and stable. No pneumothorax is seen. Bibasilar atelectatic changes are seen. No bony abnormality is noted. IMPRESSION: Tubes and lines as described.  No pneumothorax is noted. Stable bibasilar changes. Electronically Signed   By: Inez Catalina M.D.   On: 11/17/2016 07:32   Dg Chest Port 1 View  Result Date: 11/16/2016 CLINICAL DATA:  Acute respiratory failure EXAM: PORTABLE CHEST 1 VIEW COMPARISON:  11/15/2016 FINDINGS: Cardiac shadow remains mildly enlarged. Endotracheal tube, nasogastric catheter and left jugular central line are again seen. Multiple right chest tubes are again noted and stable. No definitive pneumothorax  is seen. Bibasilar infiltrative changes are noted right greater than left. The changes on the left has increased slightly  in the interval from the prior exam. IMPRESSION: Postsurgical changes. No pneumothorax is seen. Bibasilar infiltrative changes are seen. Electronically Signed   By: Inez Catalina M.D.   On: 11/16/2016 07:34   Dg Chest Port 1 View  Result Date: 11/15/2016 CLINICAL DATA:  Right lower lobe lobectomy for pulmonary abscess/empyema EXAM: PORTABLE CHEST 1 VIEW COMPARISON:  11/15/2016 FINDINGS: The heart size and mediastinal contours are within normal limits. Endotracheal tube tip is slightly low lying at 2.4 cm above the carina. Pullback approximately 1 cm recommended. Left IJ central line catheter is noted at the cavoatrial junction. Two right-sided chest tubes are identified projecting over the lung apex with decrease in previously noted loculated pleural opacity along the periphery of the right hemithorax. Bibasilar atelectasis is identified. No significant pneumothorax. Gastric tube extends below the left hemidiaphragm into the expected location the stomach. The tip is excluded however. No acute osseous abnormality appear IMPRESSION: 1. Slightly low-lying endotracheal tube tip approximately 2.4 cm above the carina. Pullback approximately 1 cm recommended. Otherwise, satisfactory support line and tube positions. These results will be called to the ordering clinician or representative by the Radiologist Assistant, and communication documented in the PACS or zVision Dashboard. 2. Residual atelectasis at the right lung base without appreciable pneumothorax status post right lower lobectomy. Electronically Signed   By: Ashley Royalty M.D.   On: 11/15/2016 19:57      DISCUSSION: 67 year old female with COPD admitted 3/18 with sepsis secondary to RLL necrotizing pneumococcal pneumonia. Course complicated by pneumothorax, hypernatremia &  rt empyema s/p lobectomy/ decortication  ASSESSMENT / PLAN:  PULMONARY A: Acute hypoxemic respiratory failure secondary to pneumonia complicated by likely aspiration. Right PTX  3/22 COPD exacerbation on admission. RLL necrotizing CAP s/p lobectomy 3/28. Rt empyema s/p decortication  3/30 -> Doing sBT but very tachypneic and does not meet extubation criteria. Als volume overloaded   P:   SBTs as tolerated ,  Lasix Chest tube per TCTS VAP prevention orders ct scheduled bronchodilators   CARDIOVASCULAR A:  Hypotension/shock - hypovolemia and  sepsis.  Pressors turned off AM 3/22 and remain off. H/o HTN - now back on hypertensive side 3/23.   - not on pressors P:  Labetalol as needed Ct scheduled metoprolol 3/25 per  home regimen with BP parameters  RENAL A:   AKI - secondary to hypoperfusion, NSAIDS, and ACEi.  Gradually improving. Hypokalemia - s/p repletion AM 3/23 amd 3/30 Volume overload - improving Hypernatremia -resolved   - Volume overloaded 3/30  P:   Correct electrolytes as indicated Increase lasix Dc  free water   GASTROINTESTINAL A:   Nutrition. GI prophylaxis. P:   Resume Tfs  Continue Protonix  HEMATOLOGIC A:   Anemia. VTE prophylaxis. P:  Transfusion threshold hemoglobin greater than 7   DVT prophylaxis   INFECTIOUS A:   Septic shock secondary to probable aspiration +/- CAP. ? Empyema   3/30 - not on pressors P:   Continue unasyn , plan 2-4 weeks on abx depending on clinical & radio improvement  ENDOCRINE A:   Hyperglycemia, on d5W , off steroids. P:   Continue SSI, Lantus 25 bid - am dose held while npo   NEUROLOGIC A:   Acute metabolic encephalopathy: sepsis and acidosis; resolved 3/23.  3/30 - normal mental status on prn fentanyl  P:   RASS goal: -1 Daily wakeup  assessment.  PRn fentanyl  FAMILY  - Updates: Updated daughter 3/29. No family at bedside 11/17/2016   - Inter-disciplinary family meet or Palliative Care meeting due by: 3/25         The patient is critically ill with multiple organ systems failure and requires high complexity decision making for assessment and support,  frequent evaluation and titration of therapies, application of advanced monitoring technologies and extensive interpretation of multiple databases.   Critical Care Time devoted to patient care services described in this note is  30  Minutes. This time reflects time of care of this signee Dr Brand Males. This critical care time does not reflect procedure time, or teaching time or supervisory time of PA/NP/Med student/Med Resident etc but could involve care discussion time    Dr. Brand Males, M.D., [facility].C.P Pulmonary and Critical Care Medicine Staff Physician [facility] Pager: [phone], If no answer or between  15:00h - 7:00h: call [phone]  11/17/2016 10:48 AM

## 2016-11-18 ENCOUNTER — Inpatient Hospital Stay (HOSPITAL_COMMUNITY): Payer: Medicare Other

## 2016-11-18 DIAGNOSIS — N170 Acute kidney failure with tubular necrosis: Secondary | ICD-10-CM

## 2016-11-18 LAB — GLUCOSE, CAPILLARY
GLUCOSE-CAPILLARY: 108 mg/dL — AB (ref 65–99)
GLUCOSE-CAPILLARY: 125 mg/dL — AB (ref 65–99)
GLUCOSE-CAPILLARY: 139 mg/dL — AB (ref 65–99)
GLUCOSE-CAPILLARY: 116 mg/dL — AB (ref 65–99)
Glucose-Capillary: 121 mg/dL — ABNORMAL HIGH (ref 65–99)

## 2016-11-18 LAB — TYPE AND SCREEN
ABO/RH(D): O POS
Antibody Screen: NEGATIVE
Unit division: 0
Unit division: 0
Unit division: 0
Unit division: 0
Unit division: 0
Unit division: 0
Unit division: 0
Unit division: 0

## 2016-11-18 LAB — BASIC METABOLIC PANEL
Anion gap: 7 (ref 5–15)
BUN: 30 mg/dL — ABNORMAL HIGH (ref 6–20)
CO2: 29 mmol/L (ref 22–32)
CREATININE: 0.63 mg/dL (ref 0.44–1.00)
Calcium: 7.8 mg/dL — ABNORMAL LOW (ref 8.9–10.3)
Chloride: 110 mmol/L (ref 101–111)
GFR calc non Af Amer: >60 mL/min (ref >60)
Glucose, Bld: 140 mg/dL — ABNORMAL HIGH (ref 65–99)
Potassium: 4.1 mmol/L (ref 3.5–5.1)
SODIUM: 146 mmol/L — AB (ref 135–145)

## 2016-11-18 LAB — CBC WITH DIFFERENTIAL/PLATELET
BASOS ABS: 0 10*3/uL (ref 0.0–0.1)
Basophils Relative: 0 %
Eosinophils Absolute: 0 10*3/uL (ref 0.0–0.7)
Eosinophils Relative: 0 %
HEMATOCRIT: 26.1 % — AB (ref 36.0–46.0)
HEMOGLOBIN: 8.1 g/dL — AB (ref 12.0–15.0)
LYMPHS PCT: 11 %
Lymphs Abs: 1.1 10*3/uL (ref 0.7–4.0)
MCH: 27.6 pg (ref 26.0–34.0)
MCHC: 31 g/dL (ref 30.0–36.0)
MCV: 88.8 fL (ref 78.0–100.0)
MONO ABS: 1 10*3/uL (ref 0.1–1.0)
Monocytes Relative: 10 %
NEUTROS ABS: 7.9 10*3/uL — AB (ref 1.7–7.7)
NEUTROS PCT: 79 %
Platelets: 420 10*3/uL — ABNORMAL HIGH (ref 150–400)
RBC: 2.94 MIL/uL — AB (ref 3.87–5.11)
RDW: 17.8 % — AB (ref 11.5–15.5)
WBC: 10 10*3/uL (ref 4.0–10.5)

## 2016-11-18 LAB — PHOSPHORUS: PHOSPHORUS: 3.2 mg/dL (ref 2.5–4.6)

## 2016-11-18 LAB — BPAM RBC
BLOOD PRODUCT EXPIRATION DATE: 201804272359
BLOOD PRODUCT EXPIRATION DATE: 201804272359
Blood Product Expiration Date: 201804252359
Blood Product Expiration Date: 201804252359
Blood Product Expiration Date: 201804252359
Blood Product Expiration Date: 201804252359
Blood Product Expiration Date: 201804272359
Blood Product Expiration Date: 201804272359
ISSUE DATE / TIME: 201803281208
ISSUE DATE / TIME: 201803281208
ISSUE DATE / TIME: 201803281405
ISSUE DATE / TIME: 201803281405
ISSUE DATE / TIME: 201803281540
ISSUE DATE / TIME: 201803281540
UNIT TYPE AND RH: 5100
UNIT TYPE AND RH: 5100
UNIT TYPE AND RH: 5100
UNIT TYPE AND RH: 5100
UNIT TYPE AND RH: 5100
Unit Type and Rh: 5100
Unit Type and Rh: 5100
Unit Type and Rh: 5100

## 2016-11-18 LAB — MAGNESIUM: Magnesium: 1.9 mg/dL (ref 1.7–2.4)

## 2016-11-18 MED ORDER — MAGNESIUM SULFATE 4 GM/100ML IV SOLN
4.0000 g | Freq: Once | INTRAVENOUS | Status: AC
  Administered 2016-11-18: 4 g via INTRAVENOUS
  Filled 2016-11-18: qty 100

## 2016-11-18 NOTE — Progress Notes (Signed)
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  Found to have an infiltrate on CXR, determined to be CAP.  There was a concern for pleural effusion and PCCM was consulted for empyema.  Patient has base line COPD and is 2-3 liter O2 dependent at home on combivent and albuterol.  STUDIES:  3/21 2 d >> EF 65-70%, G1DD  CULTURES: Sputum 3/20 > neg 3/21 bc x 2>>ng 3/21 RVP>> neg u strep ag POS  ANTIBIOTICS: Azithromycin 3/18 > 3/18 Ceftriaxone 3/18 > 3/21 Vanc 3/21 >  3/21 Unasyn 3/21 >   LINES/TUBES: ETT 3/20 > 3/20 cvl>> 3/20 rad aline>>3/25 3/28 radial a line (OR) >.>out   SIGNIFICANT EVENTS  11/05/2016 admission for PNA 3/20 transfer to ICU and intubated 3/21 refractory shock 3/22 > right PTX > chest tube placed 3/28 >> RT VATS ,right mini thoracotomy, drainage of empyema, decortication, right lower lobectomy. 3/29  - C/o pain rt chest Critically ill, intubated, weaning on 10/5 Afebrile 3/30 -  Weaning but tachypneic and needing higher support. On PRN sedation. Not on pressors. Afebrile  SUBJECTIVE/OVERNIGHT/INTERVAL HX Remains needing high PS needs on weaning No pressors Neg 2.5 liters  VITAL SIGNS: Temp:  [97 F (36.1 C)-97.7 F (36.5 C)] 97.5 F (36.4 C) (03/31 1151) Pulse Rate:  [25-94] 60 (03/31 1149) Resp:  [16-28] 22 (03/31 1149) BP: (110-137)/(47-73) 125/50 (03/31 1149) SpO2:  [99 %-100 %] 100 % (03/31 1149) FiO2 (%):  [40 %-60 %] 40 % (03/31 1149) Weight:  [73.9 kg (162 lb 14.7 oz)] 73.9 kg (162 lb 14.7 oz) (03/31 0416)  PHYSICAL EXAMINATION:  General: awake, FC Neuro: nonfocal, perrrl FC HEENT: jvd PULM: CTA, chest tubes in place, 150 out CV:  s1 s2 RRR GI: soft, BS wnl no r Extremities: edema  PULMONARY  Recent Labs Lab 11/15/16 0310 11/15/16 1605 11/16/16 0323 11/16/16 1139  PHART 7.461* 7.357 7.376 7.381  PCO2ART 45.3 48.3* 44.5 46.2  PO2ART 83.0 139.0* 270* 105.0  HCO3 32.3* 27.6 25.5 27.4  TCO2 34 29  --  29  O2SAT 97.0 99.0 99.6 98.0    CBC  Recent Labs Lab 11/16/16 0430 11/17/16 0435 11/18/16 0441  HGB 8.3* 8.4* 8.1*  HCT 25.7* 26.3* 26.1*  WBC 11.2* 11.5* 10.0  PLT 305 388 420*    COAGULATION  Recent Labs Lab 11/14/16 1714  INR 1.26    CARDIAC  No results for input(s): TROPONINI in the last 168 hours. No results for input(s): PROBNP in the last 168 hours.   CHEMISTRY  Recent Labs Lab 11/12/16 0415  11/14/16 1714 11/15/16 0444 11/15/16 1405 11/15/16 1512 11/15/16 1605 11/16/16 0430 11/17/16 0435 11/18/16 0000 11/18/16 0441  NA 161*  < > 143 145 144 144 149* 143 145 146*  --   K 2.9*  < > 3.9 3.7 3.7 4.0 4.1 3.7 3.2* 4.1  --   CL 114*  < > 107 105  --   --   --  110 110 110  --   CO2 38*  < > 29 31  --   --   --  27 28 29   --   GLUCOSE 105*  < > 221* 96  155* 157*  --  169* 137* 140*  --   BUN 60*  < > 36* 38*  --   --   --  36* 30* 30*  --   CREATININE 0.76  < > 0.73 0.67  --   --   --  0.82 0.73 0.63  --   CALCIUM 7.8*  < > 7.5* 7.8*  --   --   --  7.3* 7.6* 7.8*  --   MG 2.0  --   --   --   --   --   --  1.6*  --   --  1.9  PHOS 3.0  --   --   --   --   --   --  4.4  --   --  3.2  < > = values in this interval not displayed. Estimated Creatinine Clearance: 69.5 mL/min (by C-G formula based on SCr of 0.63 mg/dL).   LIVER  Recent Labs Lab 11/14/16 1714 11/17/16 0435  AST 36 18  ALT 35 20  ALKPHOS 79 49  BILITOT 0.5 0.3  PROT 5.5* 4.4*  ALBUMIN 1.1* 1.5*  INR 1.26  --      INFECTIOUS No results for input(s): LATICACIDVEN, PROCALCITON in the last 168 hours.   ENDOCRINE CBG (last 3)   Recent Labs  11/18/16 0331 11/18/16 0746 11/18/16 1153  GLUCAP 108*  121* 125*         IMAGING x48h  - image(s) personally visualized  -   highlighted in bold Dg Chest Port 1 View  Result Date: 11/18/2016 CLINICAL DATA:  Evaluate ETT. EXAM: PORTABLE CHEST 1 VIEW COMPARISON:  November 17, 2016 FINDINGS: The ETT is in good position. Right-sided chest tubes remain, in good position. The enteric tube terminates below today's study but the side port is below the GE junction within the stomach. No pneumothorax. The cardiomediastinal silhouette is stable. Small bilateral pleural effusions with underlying atelectasis, unchanged. A left-sided central line terminates near the caval atrial junction. It is possible it could extend into the right atrium. This appears to be unchanged in the interval. No overt edema. IMPRESSION: 1. Stable support apparatus. The distal tip of the right central line is near the caval atrial junction, either within the distal SVC or just within the right atrium. 2. Small bilateral effusions with underlying atelectasis remain. Electronically Signed   By: Dorise Bullion III M.D   On: 11/18/2016 07:26   Dg Chest Port 1 View  Result Date: 11/17/2016 CLINICAL DATA:  Chest 2.  Change in respirations and breath sounds EXAM: PORTABLE CHEST 1 VIEW COMPARISON:  11/17/2016 FINDINGS: Right chest tube, endotracheal tube, NG tube and left central line remain in place, unchanged. Bibasilar atelectasis is stable. No pneumothorax. Probable small effusions. IMPRESSION: No pneumothorax. Continued bibasilar atelectasis and small effusions. Electronically Signed   By: Rolm Baptise M.D.   On: 11/17/2016 12:57   Dg Chest Port 1 View  Result Date: 11/17/2016 CLINICAL DATA:  Respiratory failure EXAM: PORTABLE CHEST 1 VIEW COMPARISON:  11/16/2016 FINDINGS: Endotracheal tube, nasogastric catheter and left jugular central line are again seen and stable. Chest tubes are noted on the right and stable. No pneumothorax is seen. Bibasilar atelectatic changes are seen. No bony  abnormality is noted. IMPRESSION: Tubes and lines as described.  No pneumothorax is noted. Stable bibasilar changes. Electronically Signed   By: Inez Catalina M.D.   On: 11/17/2016 07:32      DISCUSSION: 66 year old female  with COPD admitted 3/18 with sepsis secondary to RLL necrotizing pneumococcal pneumonia. Course complicated by pneumothorax, hypernatremia &  rt empyema s/p lobectomy/ decortication  ASSESSMENT / PLAN:  PULMONARY A: Acute hypoxemic respiratory failure secondary to pneumonia complicated by likely aspiration. Right PTX 3/22 COPD exacerbation on admission. RLL necrotizing CAP s/p lobectomy 3/28. Rt empyema s/p decortication  3/30 -> Doing sBT but very tachypneic and does not meet extubation criteria. Als volume overloaded   P:   Had success neg 2.5 liters Weaning this am needing PS 16 Last abg reviewed, keep same MV on vent pcxr resolving, repeat in am for ett Lasix to continue Want 5 cc/kg on TV Rate 30 or less  CARDIOVASCULAR A:  Hypotension/shock - hypovolemia and  sepsis.  Pressors turned off AM 3/22 and remain off. H/o HTN - now back on hypertensive side 3/23.   - not on pressors P:  Labetalol as needed Lasix maintain  RENAL A:   AKI - secondary to hypoperfusion, NSAIDS, and ACEi.  Gradually improving. Hypokalemia - s/p repletion AM 3/23 amd 3/30 Volume overload - improving Hypernatremia -resolved   - Volume overloaded 3/30  P:   bmet in am  Lasix keep about same dose Consider mag sugg  GASTROINTESTINAL A:   Nutrition. GI prophylaxis. P:   Resume Tfs  Last BM noted Continue Protonix  HEMATOLOGIC A:   Anemia. VTE prophylaxis. P:  Transfusion threshold hemoglobin greater than 7   DVT prophylaxis lasix  INFECTIOUS A:   Septic shock secondary to probable aspiration +/- CAP. ? Empyema   3/30 - not on pressors P:   Continue unasyn , plan 2-4 weeks  ENDOCRINE A:   Hyperglycemia, on d5W , off steroids. controlled P:     Continue SSI, Lantus 25 bid - am dose held while npo   NEUROLOGIC A:   Acute metabolic encephalopathy: sepsis and acidosis; resolved 3/23.  3/30 - normal mental status on prn fentanyl  P:   RASS goal: -1 Daily wakeup assessment.  PRn fentanyl  FAMILY  - Updates: Updated daughter 3/29. No family at bedside 11/18/2016   - Inter-disciplinary family meet or Palliative Care meeting due by: 3/25   Ccm time 62m in   Lavon Paganini. Titus Mould, MD, [place] Pgr: [facility]

## 2016-11-18 NOTE — Progress Notes (Addendum)
[facility] DAILY ICU PROGRESS NOTE                   [address]            [place] [address]          [phone]   3 Days Post-Op Procedure(s) (LRB): VIDEO BRONCHOSCOPY (N/A) VIDEO ASSISTED THORACOSCOPY (VATS), MINI THORACOTOMY, DRAINAGE OF EMPYEMA, DECORTICATION, RIGHT LOWER LOBE RESECTION (Right)  Total Length of Stay:  LOS: 13 days   Subjective: Alert, some pain from tubes and incision  Objective: Vital signs in last 24 hours: Temp:  [97 F (36.1 C)-97.7 F (36.5 C)] 97.6 F (36.4 C) (03/31 0749) Pulse Rate:  [66-94] 73 (03/31 0812) Cardiac Rhythm: Normal sinus rhythm (03/31 0701) Resp:  [16-29] 22 (03/31 0812) BP: (108-137)/(47-73) 137/73 (03/31 0812) SpO2:  [99 %-100 %] 100 % (03/31 0812) FiO2 (%):  [40 %-60 %] 40 % (03/31 0812) Weight:  [162 lb 14.7 oz (73.9 kg)] 162 lb 14.7 oz (73.9 kg) (03/31 0416)  Filed Weights   11/16/16 0501 11/17/16 0500 11/18/16 0416  Weight: 169 lb 15.6 oz (77.1 kg) 173 lb 15.1 oz (78.9 kg) 162 lb 14.7 oz (73.9 kg)    Weight change: -11 lb 0.4 oz (-5 kg)   Hemodynamic parameters for last 24 hours:   Vent Mode: CPAP;PSV FiO2 (%):  [40 %-60 %] 40 % Set Rate:  [12 bmp] 12 bmp Vt Set:  [470 mL] 470 mL PEEP:  [5 cmH20] 5 cmH20 Pressure Support:  [16 cmH20] 16 cmH20 Plateau Pressure:  [17 cmH20-25 cmH20] 17 cmH20    Intake/Output from previous day: 03/30 0701 - 03/31 0700 In: 1627.8 [NG/GT:1207.8; IV Piggyback:300] Out: 4200 [Urine:4200]  Intake/Output this shift: Total I/O In: 45.8 [NG/GT:45.8] Out: 400 [Urine:400]  Current Meds: Scheduled Meds: . ampicillin-sulbactam (UNASYN) IV  3 g Intravenous Q8H  . atorvastatin  20 mg Per Tube Daily  . chlorhexidine gluconate (MEDLINE KIT)  15 mL Mouth Rinse BID  . Chlorhexidine Gluconate Cloth  6 each Topical Q0600  . enoxaparin (LOVENOX) injection  40 mg Subcutaneous Q24H  . feeding supplement (PRO-STAT SUGAR FREE 64)  30 mL Per Tube Daily  . furosemide  40 mg  Intravenous Q8H  . insulin aspart  0-9 Units Subcutaneous Q4H  . insulin glargine  25 Units Subcutaneous BID  . ipratropium-albuterol  3 mL Inhalation QID  . mouth rinse  15 mL Mouth Rinse QID  . methylPREDNISolone (SOLU-MEDROL) injection  40 mg Intravenous Daily  . metoprolol tartrate  100 mg Per Tube BID  . pantoprazole sodium  40 mg Per Tube Daily   Continuous Infusions: . feeding supplement (VITAL AF 1.2 CAL) 1,000 mL (11/17/16 1134)   PRN Meds:.albuterol, fentaNYL (SUBLIMAZE) injection, labetalol, midazolam, ondansetron (ZOFRAN) IV, potassium chloride (KCL MULTIRUN) 30 mEq in 265 mL IVPB  General appearance: alert, cooperative and no distress Heart: regular rate and rhythm Lungs: fairly clear anteriorly Abdomen: benign Extremities: + LE edema Wound: dressings intact  Lab Results: CBC: Recent Labs  11/17/16 0435 11/18/16 0441  WBC 11.5* 10.0  HGB 8.4* 8.1*  HCT 26.3* 26.1*  PLT 388 420*   BMET:  Recent Labs  11/17/16 0435 11/18/16 0000  NA 145 146*  K 3.2* 4.1  CL 110 110  CO2 28 29  GLUCOSE 137* 140*  BUN 30* 30*  CREATININE 0.73 0.63  CALCIUM 7.6* 7.8*    CMET: Lab Results  Component Value Date   WBC 10.0 11/18/2016  HGB 8.1 (L) 11/18/2016   HCT 26.1 (L) 11/18/2016   PLT 420 (H) 11/18/2016   GLUCOSE 140 (H) 11/18/2016   ALT 20 11/17/2016   AST 18 11/17/2016   NA 146 (H) 11/18/2016   K 4.1 11/18/2016   CL 110 11/18/2016   CREATININE 0.63 11/18/2016   BUN 30 (H) 11/18/2016   CO2 29 11/18/2016   INR 1.26 11/14/2016      PT/INR: No results for input(s): LABPROT, INR in the last 72 hours. Radiology: Dg Chest Port 1 View  Result Date: 11/18/2016 CLINICAL DATA:  Evaluate ETT. EXAM: PORTABLE CHEST 1 VIEW COMPARISON:  November 17, 2016 FINDINGS: The ETT is in good position. Right-sided chest tubes remain, in good position. The enteric tube terminates below today's study but the side port is below the GE junction within the stomach. No pneumothorax. The  cardiomediastinal silhouette is stable. Small bilateral pleural effusions with underlying atelectasis, unchanged. A left-sided central line terminates near the caval atrial junction. It is possible it could extend into the right atrium. This appears to be unchanged in the interval. No overt edema. IMPRESSION: 1. Stable support apparatus. The distal tip of the right central line is near the caval atrial junction, either within the distal SVC or just within the right atrium. 2. Small bilateral effusions with underlying atelectasis remain. Electronically Signed   By: Dorise Bullion III M.D   On: 11/18/2016 07:26   Dg Chest Port 1 View  Result Date: 11/17/2016 CLINICAL DATA:  Chest 2.  Change in respirations and breath sounds EXAM: PORTABLE CHEST 1 VIEW COMPARISON:  11/17/2016 FINDINGS: Right chest tube, endotracheal tube, NG tube and left central line remain in place, unchanged. Bibasilar atelectasis is stable. No pneumothorax. Probable small effusions. IMPRESSION: No pneumothorax. Continued bibasilar atelectasis and small effusions. Electronically Signed   By: Rolm Baptise M.D.   On: 11/17/2016 12:57     Assessment/Plan: S/P Procedure(s) (LRB): VIDEO BRONCHOSCOPY (N/A) VIDEO ASSISTED THORACOSCOPY (VATS), MINI THORACOTOMY, DRAINAGE OF EMPYEMA, DECORTICATION, RIGHT LOWER LOBE RESECTION (Right)  1 stable 2 CT + small airleak, CT 150 cc yesterday- keep tubes for now 3 CCM managing vent wean, primary medical management 4 labs are stable 5 conts unasyn  Diana Gardner,Diana Gardner 11/18/2016 9:21 AM    Chart reviewed, patient examined, agree with above. CXR ok. Will keep tubes in until off vent.

## 2016-11-19 ENCOUNTER — Inpatient Hospital Stay (HOSPITAL_COMMUNITY): Payer: Medicare Other

## 2016-11-19 LAB — CBC WITH DIFFERENTIAL/PLATELET
Basophils Absolute: 0 10*3/uL (ref 0.0–0.1)
Basophils Relative: 0 %
EOS ABS: 0 10*3/uL (ref 0.0–0.7)
Eosinophils Relative: 1 %
HCT: 24.1 % — ABNORMAL LOW (ref 36.0–46.0)
Hemoglobin: 7.6 g/dL — ABNORMAL LOW (ref 12.0–15.0)
LYMPHS PCT: 15 %
Lymphs Abs: 1.1 10*3/uL (ref 0.7–4.0)
MCH: 28.1 pg (ref 26.0–34.0)
MCHC: 31.5 g/dL (ref 30.0–36.0)
MCV: 89.3 fL (ref 78.0–100.0)
MONO ABS: 0.8 10*3/uL (ref 0.1–1.0)
MONOS PCT: 10 %
Neutro Abs: 5.7 10*3/uL (ref 1.7–7.7)
Neutrophils Relative %: 74 %
PLATELETS: 406 10*3/uL — AB (ref 150–400)
RBC: 2.7 MIL/uL — ABNORMAL LOW (ref 3.87–5.11)
RDW: 18 % — AB (ref 11.5–15.5)
WBC: 7.5 10*3/uL (ref 4.0–10.5)

## 2016-11-19 LAB — GLUCOSE, CAPILLARY
GLUCOSE-CAPILLARY: 102 mg/dL — AB (ref 65–99)
GLUCOSE-CAPILLARY: 105 mg/dL — AB (ref 65–99)
GLUCOSE-CAPILLARY: 117 mg/dL — AB (ref 65–99)
GLUCOSE-CAPILLARY: 127 mg/dL — AB (ref 65–99)
GLUCOSE-CAPILLARY: 160 mg/dL — AB (ref 65–99)
Glucose-Capillary: 140 mg/dL — ABNORMAL HIGH (ref 65–99)
Glucose-Capillary: 159 mg/dL — ABNORMAL HIGH (ref 65–99)

## 2016-11-19 LAB — BASIC METABOLIC PANEL
Anion gap: 6 (ref 5–15)
BUN: 32 mg/dL — AB (ref 6–20)
CO2: 37 mmol/L — ABNORMAL HIGH (ref 22–32)
CREATININE: 0.64 mg/dL (ref 0.44–1.00)
Calcium: 8.1 mg/dL — ABNORMAL LOW (ref 8.9–10.3)
Chloride: 101 mmol/L (ref 101–111)
GFR calc Af Amer: >60 mL/min (ref >60)
GLUCOSE: 124 mg/dL — AB (ref 65–99)
POTASSIUM: 3.4 mmol/L — AB (ref 3.5–5.1)
SODIUM: 144 mmol/L (ref 135–145)

## 2016-11-19 LAB — MAGNESIUM: Magnesium: 2.3 mg/dL (ref 1.7–2.4)

## 2016-11-19 LAB — PHOSPHORUS: Phosphorus: 3.7 mg/dL (ref 2.5–4.6)

## 2016-11-19 MED ORDER — FUROSEMIDE 10 MG/ML IJ SOLN
80.0000 mg | Freq: Three times a day (TID) | INTRAMUSCULAR | Status: DC
  Administered 2016-11-19 – 2016-11-20 (×2): 80 mg via INTRAVENOUS
  Filled 2016-11-19 (×3): qty 8

## 2016-11-19 MED ORDER — TRAZODONE HCL 50 MG PO TABS
50.0000 mg | ORAL_TABLET | Freq: Once | ORAL | Status: AC
Start: 2016-11-19 — End: 2016-11-19
  Administered 2016-11-19: 50 mg
  Filled 2016-11-19: qty 1

## 2016-11-19 NOTE — Progress Notes (Signed)
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  Found to have an infiltrate on CXR, determined to be CAP.  There was a concern for pleural effusion and PCCM was consulted for empyema.  Patient has base line COPD and is 2-3 liter O2 dependent at home on combivent and albuterol.  STUDIES:  3/21 2 d >> EF 65-70%, G1DD  CULTURES: Sputum 3/20 > neg 3/21 bc x 2>>ng 3/21 RVP>> neg u strep ag POS  ANTIBIOTICS: Azithromycin 3/18 > 3/18 Ceftriaxone 3/18 > 3/21 Vanc 3/21 >  3/21 Unasyn 3/21 >   LINES/TUBES: ETT 3/20 > 3/20 cvl>> 3/20 rad aline>>3/25 3/28 radial a line (OR) >.>out   SIGNIFICANT EVENTS  11/05/2016 admission for PNA 3/20 transfer to ICU and intubated 3/21 refractory shock 3/22 > right PTX > chest tube placed 3/28 >> RT VATS ,right mini thoracotomy, drainage of empyema, decortication, right lower lobectomy. 3/29  - C/o pain rt chest Critically ill, intubated, weaning on 10/5 Afebrile 3/30 -  Weaning but tachypneic and needing higher support. On PRN sedation. Not on pressors. Afebrile  3/31-  Remains needing high PS needs on weaning No pressors Neg 2.5 liters    SUBJECTIVE/OVERNIGHT/INTERVAL HX 11/19/16 - + 3.7 since admit. Being diuresed. Neeing 12/5 PSV; uanble to go to 10/5  VITAL SIGNS: Temp:  [97.6 F (36.4 C)-98.5 F (36.9 C)] 98.3 F (36.8 C) (04/01 1150) Pulse Rate:  [49-85] 73 (04/01 1313) Resp:  [12-35] 29 (04/01 1313) BP: (101-147)/(47-123) 133/58 (04/01 1313) SpO2:  [98 %-100 %] 99 % (04/01 1313) FiO2 (%):  [40 %] 40 % (04/01 1313) Weight:  [73.7 kg (162 lb 7.7 oz)] 73.7 kg (162 lb 7.7 oz) (04/01 0400)  PHYSICAL EXAMINATION:  General  Appearance:    Looks better. Fral  Head:    Normocephalic, without obvious abnormality, atraumatic  Eyes:    PERRL - yes, conjunctiva/corneas - clear      Ears:    Normal external ear canals, both ears  Nose:   NG tube - no  Throat:  ETT TUBE - yes , OG tube - yes  Neck:   Supple,  No enlargement/tenderness/nodules     Lungs:     Clear to auscultation bilaterally, Ventilator   Synchrony - yes  Chest wall:    No deformity  Heart:    S1 and S2 normal, no murmur, CVP - no.  Pressors - no  Abdomen:     Soft, no masses, no organomegaly  Genitalia:    Not done  Rectal:   not done  Extremities:   Extremities- edema + , volume overloaded     Skin:   Intact in exposed areas . Sacral area - no reports of decub     Neurologic:   Sedation - none -> RASS - +1 . Moves all 4s - yes. CAM-ICU - neg . Orientation - watching TV     PULMONARY  Recent Labs Lab 11/15/16 0310 11/15/16 1605 11/16/16 0323 11/16/16 1139  PHART 7.461* 7.357 7.376 7.381  PCO2ART 45.3 48.3* 44.5 46.2  PO2ART 83.0 139.0* 270* 105.0  HCO3 32.3* 27.6 25.5 27.4  TCO2 34 29  --  29  O2SAT  97.0 99.0 99.6 98.0    CBC  Recent Labs Lab 11/17/16 0435 11/18/16 0441 11/19/16 0400  HGB 8.4* 8.1* 7.6*  HCT 26.3* 26.1* 24.1*  WBC 11.5* 10.0 7.5  PLT 388 420* 406*    COAGULATION  Recent Labs Lab 11/14/16 1714  INR 1.26    CARDIAC  No results for input(s): TROPONINI in the last 168 hours. No results for input(s): PROBNP in the last 168 hours.   CHEMISTRY  Recent Labs Lab 11/15/16 0444  11/15/16 1512 11/15/16 1605 11/16/16 0430 11/17/16 0435 11/18/16 0000 11/18/16 0441 11/19/16 0400  NA 145  < > 144 149* 143 145 146*  --  144  K 3.7  < > 4.0 4.1 3.7 3.2* 4.1  --  3.4*  CL 105  --   --   --  110 110 110  --  101  CO2 31  --   --   --  27 28 29   --  37*  GLUCOSE 96  < > 157*  --  169* 137* 140*  --  124*  BUN 38*  --   --   --  36* 30* 30*  --  32*  CREATININE 0.67  --   --   --  0.82 0.73 0.63  --   0.64  CALCIUM 7.8*  --   --   --  7.3* 7.6* 7.8*  --  8.1*  MG  --   --   --   --  1.6*  --   --  1.9 2.3  PHOS  --   --   --   --  4.4  --   --  3.2 3.7  < > = values in this interval not displayed. Estimated Creatinine Clearance: 69.4 mL/min (by C-G formula based on SCr of 0.64 mg/dL).   LIVER  Recent Labs Lab 11/14/16 1714 11/17/16 0435  AST 36 18  ALT 35 20  ALKPHOS 79 49  BILITOT 0.5 0.3  PROT 5.5* 4.4*  ALBUMIN 1.1* 1.5*  INR 1.26  --      INFECTIOUS No results for input(s): LATICACIDVEN, PROCALCITON in the last 168 hours.   ENDOCRINE CBG (last 3)   Recent Labs  11/19/16 0338 11/19/16 0752 11/19/16 1151  GLUCAP 117* 105* 160*         IMAGING x48h  - image(s) personally visualized  -   highlighted in bold Dg Chest Port 1 View  Result Date: 11/19/2016 CLINICAL DATA:  Pneumonia EXAM: PORTABLE CHEST 1 VIEW COMPARISON:  11/18/2016 FINDINGS: Three chest tubes on the right unchanged. No pneumothorax. Minimal right effusion unchanged. Mild bibasilar airspace disease unchanged Endotracheal tube 2.5 cm above the carina. Left jugular central venous catheter tip in the right atrium. NG tube enters the stomach. IMPRESSION: No change from yesterday. Electronically Signed   By: Franchot Gallo M.D.   On: 11/19/2016 07:07   Dg Chest Port 1 View  Result Date: 11/18/2016 CLINICAL DATA:  Evaluate ETT. EXAM: PORTABLE CHEST 1 VIEW COMPARISON:  November 17, 2016 FINDINGS: The ETT is in good position. Right-sided chest tubes remain, in good position. The enteric tube terminates below today's study but the side port is below the GE junction within the stomach. No pneumothorax. The cardiomediastinal silhouette is stable. Small bilateral pleural effusions with underlying atelectasis, unchanged. A left-sided central line terminates near the caval atrial junction. It is possible it could extend into the right atrium. This appears to be unchanged in the interval. No overt edema.  IMPRESSION: 1.  Stable support apparatus. The distal tip of the right central line is near the caval atrial junction, either within the distal SVC or just within the right atrium. 2. Small bilateral effusions with underlying atelectasis remain. Electronically Signed   By: Dorise Bullion III M.D   On: 11/18/2016 07:26      DISCUSSION: 66 year old female with COPD admitted 3/18 with sepsis secondary to RLL necrotizing pneumococcal pneumonia. Course complicated by pneumothorax, hypernatremia &  rt empyema s/p lobectomy/ decortication  ASSESSMENT / PLAN:  PULMONARY A: Acute hypoxemic respiratory failure secondary to pneumonia complicated by likely aspiration. Right PTX 3/22 COPD exacerbation on admission. RLL necrotizing CAP s/p lobectomy 3/28. Rt empyema s/p decortication  4/1 - improved but still needing high SBT support. Volume overload + but beter   P:   PSV as tolerted No extubation Lasix  CARDIOVASCULAR A:  Hypotension/shock - hypovolemia and  sepsis.  Pressors turned off AM 3/22 and remain off. H/o HTN - now back on hypertensive side 3/23.   - not on pressors P:  Labetalol as needed Lasix maintain  RENAL A:   AKI - secondary to hypoperfusion, NSAIDS, and ACEi.  Gradually improving. Hypokalemia - s/p repletion AM 3/23 amd 3/30 Volume overload - improving Hypernatremia -resolved   - Volume overloaded 3/30 and still on 4/1/  P:   bmet in am  Lasix increase   GASTROINTESTINAL A:   Nutrition. GI prophylaxis. P:   Tfs  Last BM noted Continue Protonix  HEMATOLOGIC A:   Anemia. VTE prophylaxis. P:  Transfusion threshold hemoglobin greater than 7   DVT prophylaxis lasix  INFECTIOUS A:   Septic shock secondary to probable aspiration +/- CAP. ? Empyema   4/1-  not on pressors P:   Continue unasyn , plan 2-4 weeks  ENDOCRINE A:   Hyperglycemia, on d5W , off steroids. controlled P:   Continue SSI, Lantus 25 bid - am dose held while npo   NEUROLOGIC A:     Acute metabolic encephalopathy: sepsis and acidosis; resolved 3/23.  4/1 - normal mental status on prn fentanyl  P:   RASS goal: -1 Daily wakeup assessment.  PRn fentanyl  FAMILY  - Updates: Updated daughter 3/29. No family at bedside 11/19/2016   - Inter-disciplinary family meet or Palliative Care meeting due by: 3/25     The patient is critically ill with multiple organ systems failure and requires high complexity decision making for assessment and support, frequent evaluation and titration of therapies, application of advanced monitoring technologies and extensive interpretation of multiple databases.   Critical Care Time devoted to patient care services described in this note is  30  Minutes. This time reflects time of care of this signee Dr Brand Males. This critical care time does not reflect procedure time, or teaching time or supervisory time of PA/NP/Med student/Med Resident etc but could involve care discussion time    Dr. Brand Males, M.D., [facility].C.P Pulmonary and Critical Care Medicine Staff Physician [place] [facility] Pager: [phone], If no answer or between  15:00h - 7:00h: call [phone]  11/19/2016 2:11 PM

## 2016-11-19 NOTE — Progress Notes (Addendum)
T[facility] DAILY ICU PROGRESS NOTE                   [address]            [place] [address]          [phone]   4 Days Post-Op Procedure(s) (LRB): VIDEO BRONCHOSCOPY (N/A) VIDEO ASSISTED THORACOSCOPY (VATS), MINI THORACOTOMY, DRAINAGE OF EMPYEMA, DECORTICATION, RIGHT LOWER LOBE RESECTION (Right)  Total Length of Stay:  LOS: 14 days   Subjective: Alert in NAD on vent  Objective: Vital signs in last 24 hours: Temp:  [97.5 F (36.4 C)-98.2 F (36.8 C)] 98.2 F (36.8 C) (04/01 0750) Pulse Rate:  [25-85] 75 (04/01 0809) Cardiac Rhythm: Normal sinus rhythm (04/01 0800) Resp:  [12-31] 17 (04/01 0809) BP: (101-147)/(47-123) 138/62 (04/01 0809) SpO2:  [100 %] 100 % (04/01 0809) FiO2 (%):  [40 %] 40 % (04/01 0809) Weight:  [162 lb 7.7 oz (73.7 kg)] 162 lb 7.7 oz (73.7 kg) (04/01 0400)  Filed Weights   11/17/16 0500 11/18/16 0416 11/19/16 0400  Weight: 173 lb 15.1 oz (78.9 kg) 162 lb 14.7 oz (73.9 kg) 162 lb 7.7 oz (73.7 kg)    Weight change: -7.1 oz (-0.2 kg)   Hemodynamic parameters for last 24 hours:    Intake/Output from previous day: 03/31 0701 - 04/01 0700 In: 1950 [NG/GT:1215; IV Piggyback:665] Out: 4480 [Urine:4350; Chest Tube:130]  Intake/Output this shift: Total I/O In: 85 [Other:10; NG/GT:75] Out: 1100 [Urine:1100]  Current Meds: Scheduled Meds: . ampicillin-sulbactam (UNASYN) IV  3 g Intravenous Q8H  . atorvastatin  20 mg Per Tube Daily  . chlorhexidine gluconate (MEDLINE KIT)  15 mL Mouth Rinse BID  . Chlorhexidine Gluconate Cloth  6 each Topical Q0600  . enoxaparin (LOVENOX) injection  40 mg Subcutaneous Q24H  . feeding supplement (PRO-STAT SUGAR FREE 64)  30 mL Per Tube Daily  . furosemide  40 mg Intravenous Q8H  . insulin aspart  0-9 Units Subcutaneous Q4H  . insulin glargine  25 Units Subcutaneous BID  . ipratropium-albuterol  3 mL Inhalation QID  . mouth rinse  15 mL Mouth Rinse QID  . methylPREDNISolone (SOLU-MEDROL) injection   40 mg Intravenous Daily  . metoprolol tartrate  100 mg Per Tube BID  . pantoprazole sodium  40 mg Per Tube Daily   Continuous Infusions: . feeding supplement (VITAL AF 1.2 CAL) 1,000 mL (11/19/16 0700)   PRN Meds:.albuterol, fentaNYL (SUBLIMAZE) injection, labetalol, midazolam, ondansetron (ZOFRAN) IV, potassium chloride (KCL MULTIRUN) 30 mEq in 265 mL IVPB  General appearance: alert, cooperative and no distress Heart: regular rate and rhythm Lungs: clear anteriorly dim in bases Abdomen: soft, nontender Extremities: no sign edema Wound: dressings intact  Lab Results: CBC: Recent Labs  11/18/16 0441 11/19/16 0400  WBC 10.0 7.5  HGB 8.1* 7.6*  HCT 26.1* 24.1*  PLT 420* 406*   BMET:  Recent Labs  11/18/16 0000 11/19/16 0400  NA 146* 144  K 4.1 3.4*  CL 110 101  CO2 29 37*  GLUCOSE 140* 124*  BUN 30* 32*  CREATININE 0.63 0.64  CALCIUM 7.8* 8.1*    CMET: Lab Results  Component Value Date   WBC 7.5 11/19/2016   HGB 7.6 (L) 11/19/2016   HCT 24.1 (L) 11/19/2016   PLT 406 (H) 11/19/2016   GLUCOSE 124 (H) 11/19/2016   ALT 20 11/17/2016   AST 18 11/17/2016   NA 144 11/19/2016   K 3.4 (L) 11/19/2016   CL  101 11/19/2016   CREATININE 0.64 11/19/2016   BUN 32 (H) 11/19/2016   CO2 37 (H) 11/19/2016   INR 1.26 11/14/2016    Results for orders placed or performed during the hospital encounter of 11/05/16  Urine culture     Status: Abnormal   Collection Time: 11/05/16 11:31 PM  Result Value Ref Range Status   Specimen Description URINE, RANDOM  Final   Special Requests NONE  Final   Culture MULTIPLE SPECIES PRESENT, SUGGEST RECOLLECTION (A)  Final   Report Status 11/07/2016 FINAL  Final  MRSA PCR Screening     Status: None   Collection Time: 11/05/16 11:31 PM  Result Value Ref Range Status   MRSA by PCR NEGATIVE NEGATIVE Final    Comment:        The GeneXpert MRSA Assay (FDA approved for NASAL specimens only), is one component of a comprehensive MRSA  colonization surveillance program. It is not intended to diagnose MRSA infection nor to guide or monitor treatment for MRSA infections.   Culture, respiratory (NON-Expectorated)     Status: None   Collection Time: 11/07/16  3:35 PM  Result Value Ref Range Status   Specimen Description TRACHEAL ASPIRATE  Final   Special Requests NONE  Final   Gram Stain   Final    MODERATE WBC PRESENT, PREDOMINANTLY PMN NO ORGANISMS SEEN    Culture NO GROWTH 2 DAYS  Final   Report Status 11/09/2016 FINAL  Final  Culture, blood (Routine X 2) w Reflex to ID Panel     Status: None   Collection Time: 11/08/16 10:53 AM  Result Value Ref Range Status   Specimen Description BLOOD RIGHT ASSIST CONTROL  Final   Special Requests BOTTLES DRAWN AEROBIC AND ANAEROBIC 5CC EACH  Final   Culture NO GROWTH 5 DAYS  Final   Report Status 11/13/2016 FINAL  Final  Culture, blood (Routine X 2) w Reflex to ID Panel     Status: None   Collection Time: 11/08/16 11:06 AM  Result Value Ref Range Status   Specimen Description BLOOD RIGHT HAND  Final   Special Requests BOTTLES DRAWN AEROBIC ONLY 5ML  Final   Culture NO GROWTH 5 DAYS  Final   Report Status 11/13/2016 FINAL  Final  Respiratory Panel by PCR     Status: None   Collection Time: 11/08/16  2:17 PM  Result Value Ref Range Status   Adenovirus NOT DETECTED NOT DETECTED Final   Coronavirus 229E NOT DETECTED NOT DETECTED Final   Coronavirus HKU1 NOT DETECTED NOT DETECTED Final   Coronavirus NL63 NOT DETECTED NOT DETECTED Final   Coronavirus OC43 NOT DETECTED NOT DETECTED Final   Metapneumovirus NOT DETECTED NOT DETECTED Final   Rhinovirus / Enterovirus NOT DETECTED NOT DETECTED Final   Influenza A NOT DETECTED NOT DETECTED Final   Influenza B NOT DETECTED NOT DETECTED Final   Parainfluenza Virus 1 NOT DETECTED NOT DETECTED Final   Parainfluenza Virus 2 NOT DETECTED NOT DETECTED Final   Parainfluenza Virus 3 NOT DETECTED NOT DETECTED Final   Parainfluenza Virus  4 NOT DETECTED NOT DETECTED Final   Respiratory Syncytial Virus NOT DETECTED NOT DETECTED Final   Bordetella pertussis NOT DETECTED NOT DETECTED Final   Chlamydophila pneumoniae NOT DETECTED NOT DETECTED Final   Mycoplasma pneumoniae NOT DETECTED NOT DETECTED Final  Culture, respiratory (NON-Expectorated)     Status: None   Collection Time: 11/15/16 12:32 PM  Result Value Ref Range Status   Specimen Description BRONCHIAL ALVEOLAR  LAVAGE  Final   Special Requests NONE  Final   Gram Stain   Final    MODERATE WBC PRESENT, PREDOMINANTLY PMN RARE SQUAMOUS EPITHELIAL CELLS PRESENT RARE GRAM POSITIVE COCCI IN PAIRS RARE GRAM NEGATIVE COCCOBACILLI    Culture Consistent with normal respiratory flora.  Final   Report Status 11/17/2016 FINAL  Final  Aerobic/Anaerobic Culture (surgical/deep wound)     Status: None (Preliminary result)   Collection Time: 11/15/16  1:18 PM  Result Value Ref Range Status   Specimen Description TISSUE RIGHT PLEURAL  Final   Special Requests   Final    RIGHT PLEURAL PEEL PATIENT ON FOLLOWING  VANCOMYCIN   Gram Stain   Final    ABUNDANT WBC PRESENT, PREDOMINANTLY PMN NO ORGANISMS SEEN    Culture   Final    NO GROWTH 3 DAYS NO ANAEROBES ISOLATED; CULTURE IN PROGRESS FOR 5 DAYS   Report Status PENDING  Incomplete  Culture, body fluid-bottle     Status: None (Preliminary result)   Collection Time: 11/15/16  1:47 PM  Result Value Ref Range Status   Specimen Description PLEURAL RIGHT  Final   Special Requests POF VANC  Final   Culture NO GROWTH 3 DAYS  Final   Report Status PENDING  Incomplete  Gram stain     Status: None   Collection Time: 11/15/16  1:47 PM  Result Value Ref Range Status   Specimen Description PLEURAL RIGHT  Final   Special Requests NONE  Final   Gram Stain   Final    ABUNDANT WBC PRESENT, PREDOMINANTLY PMN NO ORGANISMS SEEN    Report Status 11/15/2016 FINAL  Final  Aerobic/Anaerobic Culture (surgical/deep wound)     Status: None  (Preliminary result)   Collection Time: 11/15/16  2:06 PM  Result Value Ref Range Status   Specimen Description ABSCESS RIGHT LOWER LUNG  Final   Special Requests PT ON VANC  Final   Gram Stain   Final    ABUNDANT WBC PRESENT,BOTH PMN AND MONONUCLEAR NO ORGANISMS SEEN    Culture   Final    NO GROWTH 3 DAYS NO ANAEROBES ISOLATED; CULTURE IN PROGRESS FOR 5 DAYS   Report Status PENDING  Incomplete    PT/INR: No results for input(s): LABPROT, INR in the last 72 hours. Radiology: Dg Chest Port 1 View  Result Date: 11/19/2016 CLINICAL DATA:  Pneumonia EXAM: PORTABLE CHEST 1 VIEW COMPARISON:  11/18/2016 FINDINGS: Three chest tubes on the right unchanged. No pneumothorax. Minimal right effusion unchanged. Mild bibasilar airspace disease unchanged Endotracheal tube 2.5 cm above the carina. Left jugular central venous catheter tip in the right atrium. NG tube enters the stomach. IMPRESSION: No change from yesterday. Electronically Signed   By: Franchot Gallo M.D.   On: 11/19/2016 07:07     Assessment/Plan: S/P Procedure(s) (LRB): VIDEO BRONCHOSCOPY (N/A) VIDEO ASSISTED THORACOSCOPY (VATS), MINI THORACOTOMY, DRAINAGE OF EMPYEMA, DECORTICATION, RIGHT LOWER LOBE RESECTION (Right)  1 chest tubes- no obvious air leak without cough, 110 cc drainage yesterday- keep tubes in place 2 vent wean/medical management per CCM 3 cultures neg so far 4 hct 24- approaching transfusion threshold   GOLD,WAYNE E 11/19/2016 8:43 AM    Chart reviewed, patient examined, agree with above.

## 2016-11-19 NOTE — Progress Notes (Signed)
Pt is placed back on FS at this time tolerating it well. No distress or complications noted. Pt is alert and oriented and nodded her head for understanding when explaining to patient that she will placed back on FS/rest mode throughout the night. Pt is stable at this time.

## 2016-11-20 ENCOUNTER — Inpatient Hospital Stay (HOSPITAL_COMMUNITY): Payer: Medicare Other

## 2016-11-20 DIAGNOSIS — Z902 Acquired absence of lung [part of]: Secondary | ICD-10-CM

## 2016-11-20 LAB — GLUCOSE, CAPILLARY
GLUCOSE-CAPILLARY: 87 mg/dL (ref 65–99)
GLUCOSE-CAPILLARY: 108 mg/dL — AB (ref 65–99)
GLUCOSE-CAPILLARY: 163 mg/dL — AB (ref 65–99)
Glucose-Capillary: 123 mg/dL — ABNORMAL HIGH (ref 65–99)
Glucose-Capillary: 66 mg/dL (ref 65–99)
Glucose-Capillary: 97 mg/dL (ref 65–99)

## 2016-11-20 LAB — CBC WITH DIFFERENTIAL/PLATELET
BASOS PCT: 0 %
Basophils Absolute: 0 10*3/uL (ref 0.0–0.1)
EOS ABS: 0.1 10*3/uL (ref 0.0–0.7)
EOS PCT: 1 %
HCT: 24.8 % — ABNORMAL LOW (ref 36.0–46.0)
Hemoglobin: 7.7 g/dL — ABNORMAL LOW (ref 12.0–15.0)
LYMPHS ABS: 1.4 10*3/uL (ref 0.7–4.0)
Lymphocytes Relative: 21 %
MCH: 27.5 pg (ref 26.0–34.0)
MCHC: 31 g/dL (ref 30.0–36.0)
MCV: 88.6 fL (ref 78.0–100.0)
Monocytes Absolute: 0.6 10*3/uL (ref 0.1–1.0)
Monocytes Relative: 9 %
Neutro Abs: 4.8 10*3/uL (ref 1.7–7.7)
Neutrophils Relative %: 69 %
PLATELETS: 482 10*3/uL — AB (ref 150–400)
RBC: 2.8 MIL/uL — AB (ref 3.87–5.11)
RDW: 17.7 % — ABNORMAL HIGH (ref 11.5–15.5)
WBC: 6.9 10*3/uL (ref 4.0–10.5)

## 2016-11-20 LAB — BASIC METABOLIC PANEL
Anion gap: 7 (ref 5–15)
Anion gap: 11 (ref 5–15)
Anion gap: 8 (ref 5–15)
BUN: 31 mg/dL — AB (ref 6–20)
BUN: 31 mg/dL — ABNORMAL HIGH (ref 6–20)
BUN: 32 mg/dL — AB (ref 6–20)
CALCIUM: 7.9 mg/dL — AB (ref 8.9–10.3)
CO2: 41 mmol/L — ABNORMAL HIGH (ref 22–32)
CO2: 37 mmol/L — AB (ref 22–32)
CO2: 37 mmol/L — ABNORMAL HIGH (ref 22–32)
CREATININE: 0.86 mg/dL (ref 0.44–1.00)
CREATININE: 0.67 mg/dL (ref 0.44–1.00)
Calcium: 8.1 mg/dL — ABNORMAL LOW (ref 8.9–10.3)
Calcium: 8.1 mg/dL — ABNORMAL LOW (ref 8.9–10.3)
Chloride: 95 mmol/L — ABNORMAL LOW (ref 101–111)
Chloride: 97 mmol/L — ABNORMAL LOW (ref 101–111)
Chloride: 97 mmol/L — ABNORMAL LOW (ref 101–111)
Creatinine, Ser: 0.67 mg/dL (ref 0.44–1.00)
GFR calc Af Amer: >60 mL/min (ref >60)
GFR calc Af Amer: >60 mL/min (ref >60)
GFR calc Af Amer: >60 mL/min (ref >60)
GFR calc non Af Amer: >60 mL/min (ref >60)
GLUCOSE: 167 mg/dL — AB (ref 65–99)
GLUCOSE: 136 mg/dL — AB (ref 65–99)
Glucose, Bld: 99 mg/dL (ref 65–99)
POTASSIUM: 3.2 mmol/L — AB (ref 3.5–5.1)
POTASSIUM: 4 mmol/L (ref 3.5–5.1)
Potassium: 3.5 mmol/L (ref 3.5–5.1)
SODIUM: 143 mmol/L (ref 135–145)
Sodium: 145 mmol/L (ref 135–145)
Sodium: 142 mmol/L (ref 135–145)

## 2016-11-20 LAB — CULTURE, BODY FLUID-BOTTLE: CULTURE: NO GROWTH

## 2016-11-20 LAB — AEROBIC/ANAEROBIC CULTURE W GRAM STAIN (SURGICAL/DEEP WOUND): Culture: NO GROWTH

## 2016-11-20 LAB — MAGNESIUM: MAGNESIUM: 2 mg/dL (ref 1.7–2.4)

## 2016-11-20 LAB — AEROBIC/ANAEROBIC CULTURE (SURGICAL/DEEP WOUND): CULTURE: NO GROWTH

## 2016-11-20 LAB — PHOSPHORUS: PHOSPHORUS: 4 mg/dL (ref 2.5–4.6)

## 2016-11-20 MED ORDER — DEXTROSE 50 % IV SOLN
25.0000 mL | Freq: Once | INTRAVENOUS | Status: AC
  Administered 2016-11-20: 25 mL via INTRAVENOUS

## 2016-11-20 MED ORDER — ORAL CARE MOUTH RINSE
15.0000 mL | Freq: Two times a day (BID) | OROMUCOSAL | Status: DC
  Administered 2016-11-21 – 2016-12-03 (×12): 15 mL via OROMUCOSAL

## 2016-11-20 MED ORDER — DEXTROSE 50 % IV SOLN
INTRAVENOUS | Status: AC
  Filled 2016-11-20: qty 50

## 2016-11-20 MED ORDER — FENTANYL CITRATE (PF) 100 MCG/2ML IJ SOLN
50.0000 ug | INTRAMUSCULAR | Status: DC | PRN
  Administered 2016-11-20 – 2016-12-01 (×21): 50 ug via INTRAVENOUS
  Filled 2016-11-20 (×23): qty 2

## 2016-11-20 MED ORDER — CHLORHEXIDINE GLUCONATE 0.12 % MT SOLN
15.0000 mL | Freq: Two times a day (BID) | OROMUCOSAL | Status: DC
  Administered 2016-11-20 – 2016-12-03 (×25): 15 mL via OROMUCOSAL
  Filled 2016-11-20 (×24): qty 15

## 2016-11-20 NOTE — Progress Notes (Signed)
[facility] Progress Note Patient Name: Diana Gardner DOB: August 17, 1951 MRN: [id number]   Date of Service  11/20/2016  HPI/Events of Note  Pain - Related to VATS and chest tubes.   eICU Interventions  Will order: 1. Fentanyl 50 mcg IV Q 2 hours PRN pain.      Intervention Category Intermediate Interventions: Pain - evaluation and management  , Eugene 11/20/2016, 7:59 PM

## 2016-11-20 NOTE — Procedures (Signed)
Extubation Procedure Note  Patient Details:   Name: JAVAEH MUSCATELLO DOB: 04-Apr-1951 MRN: [id number]   Airway Documentation:     Evaluation  O2 sats: stable throughout Complications: No apparent complications Patient did tolerate procedure well. Bilateral Breath Sounds: Clear, Diminished   Yes   Positive cuff leak noted.  Pt placed on [place] 4L with humidity, no stridor noted, pt able to get 375 using IS.  Pt tolerating well at this time.  Mingo Amber  11/20/2016, 11:10 AM

## 2016-11-20 NOTE — Progress Notes (Addendum)
TCTS DAILY ICU PROGRESS NOTE                   [address]            [place] [address]          [phone]   5 Days Post-Op Procedure(s) (LRB): VIDEO BRONCHOSCOPY (N/A) VIDEO ASSISTED THORACOSCOPY (VATS), MINI THORACOTOMY, DRAINAGE OF EMPYEMA, DECORTICATION, RIGHT LOWER LOBE RESECTION (Right)  Total Length of Stay:  LOS: 15 days   Subjective: Patient awake, alert on vent  Objective: Vital signs in last 24 hours: Temp:  [97.4 F (36.3 C)-98.7 F (37.1 C)] 98.7 F (37.1 C) (04/02 0745) Pulse Rate:  [26-91] 67 (04/02 0800) Cardiac Rhythm: Normal sinus rhythm (04/02 0500) Resp:  [12-35] 27 (04/02 0800) BP: (113-155)/(48-108) 113/56 (04/02 0800) SpO2:  [98 %-100 %] 99 % (04/02 0800) FiO2 (%):  [40 %] 40 % (04/02 0800) Weight:  [67.7 kg (149 lb 4 oz)] 67.7 kg (149 lb 4 oz) (04/02 0500)  Filed Weights   11/18/16 0416 11/19/16 0400 11/20/16 0500  Weight: 73.9 kg (162 lb 14.7 oz) 73.7 kg (162 lb 7.7 oz) 67.7 kg (149 lb 4 oz)    Weight change: -6 kg (-13 lb 3.6 oz)   Hemodynamic parameters for last 24 hours:    Intake/Output from previous day: 04/01 0701 - 04/02 0700 In: 1525 [I.V.:20; NG/GT:1125; IV Piggyback:300] Out: 4268 [Urine:5850; Chest Tube:200]  Intake/Output this shift: No intake/output data recorded.  Current Meds: Scheduled Meds: . ampicillin-sulbactam (UNASYN) IV  3 g Intravenous Q8H  . atorvastatin  20 mg Per Tube Daily  . chlorhexidine gluconate (MEDLINE KIT)  15 mL Mouth Rinse BID  . Chlorhexidine Gluconate Cloth  6 each Topical Q0600  . enoxaparin (LOVENOX) injection  40 mg Subcutaneous Q24H  . feeding supplement (PRO-STAT SUGAR FREE 64)  30 mL Per Tube Daily  . furosemide  80 mg Intravenous Q8H  . insulin aspart  0-9 Units Subcutaneous Q4H  . insulin glargine  25 Units Subcutaneous BID  . ipratropium-albuterol  3 mL Inhalation QID  . mouth rinse  15 mL Mouth Rinse QID  . methylPREDNISolone (SOLU-MEDROL) injection  40 mg  Intravenous Daily  . metoprolol tartrate  100 mg Per Tube BID  . pantoprazole sodium  40 mg Per Tube Daily   Continuous Infusions: . feeding supplement (VITAL AF 1.2 CAL) 1,000 mL (11/19/16 1400)   PRN Meds:.albuterol, fentaNYL (SUBLIMAZE) injection, labetalol, midazolam, ondansetron (ZOFRAN) IV, potassium chloride (KCL MULTIRUN) 30 mEq in 265 mL IVPB  CV-RRR Pulmonary-Mostly clear to auscultation Extremities-SCDs in place  Lab Results: CBC: Recent Labs  11/19/16 0400 11/20/16 0532  WBC 7.5 6.9  HGB 7.6* 7.7*  HCT 24.1* 24.8*  PLT 406* 482*   BMET:  Recent Labs  11/19/16 0400 11/20/16 0532  NA 144 143  K 3.4* 3.2*  CL 101 95*  CO2 37* 41*  GLUCOSE 124* 99  BUN 32* 31*  CREATININE 0.64 0.67  CALCIUM 8.1* 8.1*    CMET: Lab Results  Component Value Date   WBC 6.9 11/20/2016   HGB 7.7 (L) 11/20/2016   HCT 24.8 (L) 11/20/2016   PLT 482 (H) 11/20/2016   GLUCOSE 99 11/20/2016   ALT 20 11/17/2016   AST 18 11/17/2016   NA 143 11/20/2016   K 3.2 (L) 11/20/2016   CL 95 (L) 11/20/2016   CREATININE 0.67 11/20/2016   BUN 31 (H) 11/20/2016   CO2 41 (H) 11/20/2016  INR 1.26 11/14/2016      PT/INR: No results for input(s): LABPROT, INR in the last 72 hours. Radiology: Dg Chest Port 1 View  Result Date: 11/20/2016 CLINICAL DATA:  Status post thoracoscopy and empyema drainage with decortication on the right. EXAM: PORTABLE CHEST 1 VIEW COMPARISON:  Portable chest x-ray of November 19, 2016 FINDINGS: There remains mild volume loss on the right. There is no pneumothorax. The right-sided chest tube tip projects over the posterior aspect of the fourth rib. A second more medially positioned chest tube has its tip approximately 2 cm lateral to the hands. A third chest tube has its tip projecting over the medial aspect of the ninth rib. The left lung is well-expanded. Minimal basilar atelectasis is suspected on the left. The cardiac silhouette is top-normal in size. The pulmonary  vascularity is not engorged. The endotracheal tube tip lies approximately 3.7 cm above the carina. The esophagogastric tube tip projects below the inferior margin of the image. The left internal jugular venous catheter tip projects over the distal third of the SVC. IMPRESSION: Fairly stable appearance of the chest. No right-sided pneumothorax or significant pleural effusion. Minimal postsurgical volume loss due to lower lobectomy. Minimal left basilar atelectasis. The support tubes are in reasonable position. Electronically Signed   By: David  Martinique M.D.   On: 11/20/2016 07:08     Assessment/Plan: S/P Procedure(s) (LRB): VIDEO BRONCHOSCOPY (N/A) VIDEO ASSISTED THORACOSCOPY (VATS), MINI THORACOTOMY, DRAINAGE OF EMPYEMA, DECORTICATION, RIGHT LOWER LOBE RESECTION (Right)  1. CV-SR in the 70's. On Lopressor 100 mg bid via tube. 2. Pulmonary-Chest tubes with 200 cc of output. Chest tubes are to suction and there is no air leak. CXR this am shows no pneumothorax, some volume loss due to RLL. Contyinue Duoneb and Solu Medrol. Vent wean per pulmonary/CCM. 3. Anemia-H and H remains  7.7 and 24.8 4. Supplement potassium 5. ID-on Unasyn for PNA. Cultures negative thus far 6. GI-TFs per primary  ZIMMERMAN,DONIELLE M PA-C 11/20/2016 8:08 AM   No air leak-will dc anterior chest tube today  Remo Lipps C. Roxan Hockey, MD [facility] [phone]

## 2016-11-20 NOTE — Progress Notes (Signed)
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  Found to have an infiltrate on CXR, determined to be CAP.  There was a concern for pleural effusion and PCCM was consulted for empyema.  Patient has base line COPD and is 2-3 liter O2 dependent at home on combivent and albuterol.  STUDIES:  3/21 2 d >> EF 65-70%, G1DD  CULTURES: Sputum 3/20 > neg 3/21 bc x 2>>ng 3/21 RVP>> neg u strep ag POS  ANTIBIOTICS: Azithromycin 3/18 > 3/18 Ceftriaxone 3/18 > 3/21 Vanc 3/21 >  3/21 Unasyn 3/21 >   LINES/TUBES: ETT 3/20 > 3/20 cvl>> 3/20 rad aline>>3/25 3/28 radial a line (OR) >.>out   SIGNIFICANT EVENTS  11/05/2016 admission for PNA 3/20 transfer to ICU and intubated 3/21 refractory shock 3/22 > right PTX > chest tube placed 3/28 >> RT VATS ,right mini thoracotomy, drainage of empyema, decortication, right lower lobectomy. 3/29  - C/o pain rt chest Critically ill, intubated, weaning on 10/5 Afebrile 3/30 -  Weaning but tachypneic and needing higher support. On PRN sedation. Not on pressors. Afebrile  3/31-  Remains needing high PS needs on weaning No pressors Neg 2.5 liters    SUBJECTIVE/OVERNIGHT/INTERVAL HX No events overnight, weaning  VITAL SIGNS: Temp:  [97.4 F (36.3 C)-98.7 F (37.1 C)] 98.7 F (37.1 C) (04/02 0745) Pulse Rate:  [26-91] 67 (04/02 0800) Resp:  [12-35] 27 (04/02 0800) BP: (113-155)/(48-108) 113/56 (04/02 0800) SpO2:  [98 %-100 %] 99 % (04/02 0948) FiO2 (%):  [40 %] 40 % (04/02 0948) Weight:  [67.7 kg (149 lb 4 oz)] 67.7 kg (149 lb 4 oz) (04/02 0500)  PHYSICAL EXAMINATION:  General Appearance:    Chronically ill appearing female  Head:     [place]/AT  Eyes:    PERRL   Ears:    Normal external ear canals, both ears  Nose:   NGT in place  Throat:  ETT TUBE - yes , OG tube - yes  Neck:   Supple,  No enlargement/tenderness/nodules     Lungs:     Clear to auscultation bilaterally, Ventilator   Synchrony - yes  Chest wall:    No deformity  Heart:    S1 and S2 normal, no murmur, CVP - no.  Pressors - no  Abdomen:     Soft, no masses, no organomegaly  Genitalia:    Not done  Rectal:   not done  Extremities:   Extremities- edema + , volume overloaded     Skin:   Intact in exposed areas . Sacral area - no reports of decub     Neurologic:   Awake and interactive, moving all ext to command   PULMONARY  Recent Labs Lab 11/15/16 0310 11/15/16 1605 11/16/16 0323 11/16/16 1139  PHART 7.461* 7.357 7.376 7.381  PCO2ART 45.3 48.3* 44.5 46.2  PO2ART 83.0 139.0* 270* 105.0  HCO3 32.3* 27.6 25.5 27.4  TCO2 34 29  --  29  O2SAT 97.0 99.0 99.6 98.0   CBC  Recent Labs Lab 11/18/16 0441 11/19/16 0400 11/20/16 0532  HGB 8.1* 7.6* 7.7*  HCT 26.1* 24.1* 24.8*  WBC 10.0 7.5 6.9  PLT 420* 406* 482*  COAGULATION  Recent Labs Lab 11/14/16 1714  INR 1.26   CARDIAC  No results for input(s): TROPONINI in the last 168 hours. No results for input(s): PROBNP in the last 168 hours.  CHEMISTRY  Recent Labs Lab 11/16/16 0430 11/17/16 0435 11/18/16 0000 11/18/16 0441 11/19/16 0400 11/20/16 0532  NA 143 145 146*  --  144 143  K 3.7 3.2* 4.1  --  3.4* 3.2*  CL 110 110 110  --  101 95*  CO2 27 28 29   --  37* 41*  GLUCOSE 169* 137* 140*  --  124* 99  BUN 36* 30* 30*  --  32* 31*  CREATININE 0.82 0.73 0.63  --  0.64 0.67  CALCIUM 7.3* 7.6* 7.8*  --  8.1* 8.1*  MG 1.6*  --   --  1.9 2.3 2.0  PHOS 4.4  --   --  3.2 3.7 4.0   Estimated Creatinine Clearance: 62.7 mL/min (by C-G formula based on SCr of 0.67 mg/dL).  LIVER  Recent Labs Lab 11/14/16 1714 11/17/16 0435  AST 36 18  ALT 35 20  ALKPHOS 79 49  BILITOT 0.5 0.3    PROT 5.5* 4.4*  ALBUMIN 1.1* 1.5*  INR 1.26  --    INFECTIOUS No results for input(s): LATICACIDVEN, PROCALCITON in the last 168 hours.  ENDOCRINE CBG (last 3)   Recent Labs  11/20/16 0000 11/20/16 0426 11/20/16 0741  GLUCAP 140* 87 108*   IMAGING x48h  - image(s) personally visualized  -   highlighted in bold Dg Chest Port 1 View  Result Date: 11/20/2016 CLINICAL DATA:  Status post thoracoscopy and empyema drainage with decortication on the right. EXAM: PORTABLE CHEST 1 VIEW COMPARISON:  Portable chest x-ray of November 19, 2016 FINDINGS: There remains mild volume loss on the right. There is no pneumothorax. The right-sided chest tube tip projects over the posterior aspect of the fourth rib. A second more medially positioned chest tube has its tip approximately 2 cm lateral to the hands. A third chest tube has its tip projecting over the medial aspect of the ninth rib. The left lung is well-expanded. Minimal basilar atelectasis is suspected on the left. The cardiac silhouette is top-normal in size. The pulmonary vascularity is not engorged. The endotracheal tube tip lies approximately 3.7 cm above the carina. The esophagogastric tube tip projects below the inferior margin of the image. The left internal jugular venous catheter tip projects over the distal third of the SVC. IMPRESSION: Fairly stable appearance of the chest. No right-sided pneumothorax or significant pleural effusion. Minimal postsurgical volume loss due to lower lobectomy. Minimal left basilar atelectasis. The support tubes are in reasonable position. Electronically Signed   By: David  Martinique M.D.   On: 11/20/2016 07:08   Dg Chest Port 1 View  Result Date: 11/19/2016 CLINICAL DATA:  Pneumonia EXAM: PORTABLE CHEST 1 VIEW COMPARISON:  11/18/2016 FINDINGS: Three chest tubes on the right unchanged. No pneumothorax. Minimal right effusion unchanged. Mild bibasilar airspace disease unchanged Endotracheal tube 2.5 cm above the carina.  Left jugular central venous catheter tip in the right atrium. NG tube enters the stomach. IMPRESSION: No change from yesterday. Electronically Signed   By: Franchot Gallo M.D.   On: 11/19/2016 07:07   DISCUSSION: 66 year old female with COPD admitted 3/18 with sepsis secondary to RLL necrotizing pneumococcal pneumonia. Course complicated by pneumothorax, hypernatremia &  rt empyema s/p lobectomy/ decortication  ASSESSMENT / PLAN:  PULMONARY A: Acute hypoxemic respiratory failure secondary  to pneumonia complicated by likely aspiration. Right PTX 3/22 COPD exacerbation on admission. RLL necrotizing CAP s/p lobectomy 3/28. Rt empyema s/p decortication  P:   - Extubate - Titrate O2 for sat of 88-92% - Hold further diureses - Solumedrol 40 mg IV daily, will continue for now.  CARDIOVASCULAR A:  Hypotension/shock - hypovolemia and  sepsis.  Pressors turned off AM 3/22 and remain off. H/o HTN - now back on hypertensive side 3/23.   - not on pressors P:  - Labetalol as needed - Hold diureses  RENAL A:   AKI - secondary to hypoperfusion, NSAIDS, and ACEi.  Gradually improving. Hypokalemia - s/p repletion AM 3/23 amd 3/30 Volume overload - improving Hypernatremia -resolved  P:   - KVO IVF - D/C lasix - BMET in AM - Replace electrolytes as indicated  GASTROINTESTINAL A:   Nutrition. GI prophylaxis. P:   - D/C TF - Continue Protonix - SLP  HEMATOLOGIC A:   Anemia. VTE prophylaxis. P:  - Transfuse per ICU protocol - DVT prophylaxis  INFECTIOUS A:   Septic shock secondary to probable aspiration +/- CAP. ? Empyema  P:   - Continue unasyn , plan 2-4 weeks  ENDOCRINE A:   Hyperglycemia, on d5W , off steroids. Controlled P:   - ISS - CBGs  NEUROLOGIC A:   Acute metabolic encephalopathy: sepsis and acidosis; resolved 3/23.  P:   - D/C sedation - PRN fentanyl for pain.  FAMILY  - Updates: No family bedside, patient updated  - Inter-disciplinary  family meet or Palliative Care meeting due by: 3/25  The patient is critically ill with multiple organ systems failure and requires high complexity decision making for assessment and support, frequent evaluation and titration of therapies, application of advanced monitoring technologies and extensive interpretation of multiple databases.   Critical Care Time devoted to patient care services described in this note is  35  Minutes. This time reflects time of care of this signee Dr Jennet Maduro. This critical care time does not reflect procedure time, or teaching time or supervisory time of PA/NP/Med student/Med Resident etc but could involve care discussion time.  Rush Farmer, M.D. [facility] Pulmonary/Critical Care Medicine. Pager: [phone]. After hours pager: [phone].  11/20/2016 10:15 AM

## 2016-11-21 ENCOUNTER — Inpatient Hospital Stay (HOSPITAL_COMMUNITY): Payer: Medicare Other

## 2016-11-21 LAB — CBC WITH DIFFERENTIAL/PLATELET
BASOS ABS: 0 10*3/uL (ref 0.0–0.1)
Basophils Relative: 0 %
EOS PCT: 1 %
Eosinophils Absolute: 0.1 10*3/uL (ref 0.0–0.7)
HCT: 26.2 % — ABNORMAL LOW (ref 36.0–46.0)
Hemoglobin: 8 g/dL — ABNORMAL LOW (ref 12.0–15.0)
LYMPHS PCT: 22 %
Lymphs Abs: 1.3 10*3/uL (ref 0.7–4.0)
MCH: 27.3 pg (ref 26.0–34.0)
MCHC: 30.5 g/dL (ref 30.0–36.0)
MCV: 89.4 fL (ref 78.0–100.0)
MONO ABS: 0.6 10*3/uL (ref 0.1–1.0)
MONOS PCT: 10 %
Neutro Abs: 3.9 10*3/uL (ref 1.7–7.7)
Neutrophils Relative %: 67 %
PLATELETS: 541 10*3/uL — AB (ref 150–400)
RBC: 2.93 MIL/uL — ABNORMAL LOW (ref 3.87–5.11)
RDW: 17.6 % — AB (ref 11.5–15.5)
WBC: 5.9 10*3/uL (ref 4.0–10.5)

## 2016-11-21 LAB — BASIC METABOLIC PANEL
ANION GAP: 4 — AB (ref 5–15)
BUN: 27 mg/dL — AB (ref 6–20)
CO2: 38 mmol/L — ABNORMAL HIGH (ref 22–32)
Calcium: 8 mg/dL — ABNORMAL LOW (ref 8.9–10.3)
Chloride: 99 mmol/L — ABNORMAL LOW (ref 101–111)
Creatinine, Ser: 0.59 mg/dL (ref 0.44–1.00)
GFR calc Af Amer: >60 mL/min (ref >60)
GFR calc non Af Amer: >60 mL/min (ref >60)
GLUCOSE: 150 mg/dL — AB (ref 65–99)
Potassium: 3 mmol/L — ABNORMAL LOW (ref 3.5–5.1)
Sodium: 141 mmol/L (ref 135–145)

## 2016-11-21 LAB — PHOSPHORUS: PHOSPHORUS: 3.6 mg/dL (ref 2.5–4.6)

## 2016-11-21 LAB — GLUCOSE, CAPILLARY
GLUCOSE-CAPILLARY: 154 mg/dL — AB (ref 65–99)
GLUCOSE-CAPILLARY: 81 mg/dL (ref 65–99)
GLUCOSE-CAPILLARY: 226 mg/dL — AB (ref 65–99)
GLUCOSE-CAPILLARY: 169 mg/dL — AB (ref 65–99)
Glucose-Capillary: 124 mg/dL — ABNORMAL HIGH (ref 65–99)
Glucose-Capillary: 64 mg/dL — ABNORMAL LOW (ref 65–99)
Glucose-Capillary: 74 mg/dL (ref 65–99)

## 2016-11-21 LAB — MAGNESIUM: Magnesium: 1.9 mg/dL (ref 1.7–2.4)

## 2016-11-21 MED ORDER — PREDNISONE 20 MG PO TABS
30.0000 mg | ORAL_TABLET | Freq: Every day | ORAL | Status: AC
  Administered 2016-11-21 – 2016-11-23 (×3): 30 mg via ORAL
  Filled 2016-11-21 (×3): qty 1

## 2016-11-21 MED ORDER — INSULIN ASPART 100 UNIT/ML ~~LOC~~ SOLN
0.0000 [IU] | Freq: Three times a day (TID) | SUBCUTANEOUS | Status: DC
  Administered 2016-11-21: 3 [IU] via SUBCUTANEOUS
  Administered 2016-11-23 (×2): 2 [IU] via SUBCUTANEOUS
  Administered 2016-11-23: 1 [IU] via SUBCUTANEOUS
  Administered 2016-11-24 (×2): 2 [IU] via SUBCUTANEOUS
  Administered 2016-11-25: 3 [IU] via SUBCUTANEOUS
  Administered 2016-11-26: 2 [IU] via SUBCUTANEOUS
  Administered 2016-11-28: 3 [IU] via SUBCUTANEOUS
  Administered 2016-11-28: 1 [IU] via SUBCUTANEOUS
  Administered 2016-11-29 – 2016-11-30 (×5): 2 [IU] via SUBCUTANEOUS
  Administered 2016-12-01: 7 [IU] via SUBCUTANEOUS
  Administered 2016-12-01 – 2016-12-02 (×2): 1 [IU] via SUBCUTANEOUS
  Administered 2016-12-02 – 2016-12-03 (×3): 2 [IU] via SUBCUTANEOUS
  Administered 2016-12-03: 3 [IU] via SUBCUTANEOUS
  Administered 2016-12-04: 1 [IU] via SUBCUTANEOUS

## 2016-11-21 MED ORDER — PREDNISONE 10 MG PO TABS
10.0000 mg | ORAL_TABLET | Freq: Every day | ORAL | Status: AC
  Administered 2016-11-27 – 2016-11-29 (×3): 10 mg via ORAL
  Filled 2016-11-21 (×4): qty 1

## 2016-11-21 MED ORDER — SODIUM CHLORIDE 0.9 % IV SOLN
30.0000 meq | Freq: Once | INTRAVENOUS | Status: AC
  Administered 2016-11-21: 30 meq via INTRAVENOUS
  Filled 2016-11-21: qty 15

## 2016-11-21 MED ORDER — ATORVASTATIN CALCIUM 20 MG PO TABS
20.0000 mg | ORAL_TABLET | Freq: Every day | ORAL | Status: DC
  Administered 2016-11-21 – 2016-12-04 (×14): 20 mg via ORAL
  Filled 2016-11-21 (×14): qty 1

## 2016-11-21 MED ORDER — PREDNISONE 20 MG PO TABS
20.0000 mg | ORAL_TABLET | Freq: Every day | ORAL | Status: AC
  Administered 2016-11-24 – 2016-11-26 (×3): 20 mg via ORAL
  Filled 2016-11-21 (×3): qty 1

## 2016-11-21 MED ORDER — METOPROLOL TARTRATE 50 MG PO TABS
100.0000 mg | ORAL_TABLET | Freq: Two times a day (BID) | ORAL | Status: DC
  Administered 2016-11-21 – 2016-11-26 (×9): 100 mg via ORAL
  Filled 2016-11-21: qty 2
  Filled 2016-11-21: qty 1
  Filled 2016-11-21 (×6): qty 2
  Filled 2016-11-21: qty 1
  Filled 2016-11-21 (×2): qty 2

## 2016-11-21 MED ORDER — DEXTROSE 50 % IV SOLN
25.0000 mL | Freq: Once | INTRAVENOUS | Status: AC
  Administered 2016-11-21: 25 mL via INTRAVENOUS

## 2016-11-21 MED ORDER — POTASSIUM CHLORIDE 2 MEQ/ML IV SOLN
30.0000 meq | Freq: Every day | INTRAVENOUS | Status: DC | PRN
  Filled 2016-11-21: qty 15

## 2016-11-21 MED ORDER — PANTOPRAZOLE SODIUM 40 MG PO TBEC
40.0000 mg | DELAYED_RELEASE_TABLET | Freq: Every day | ORAL | Status: AC
  Administered 2016-11-21 – 2016-11-30 (×10): 40 mg via ORAL
  Filled 2016-11-21 (×10): qty 1

## 2016-11-21 MED ORDER — PREDNISONE 5 MG PO TABS
5.0000 mg | ORAL_TABLET | Freq: Every day | ORAL | Status: AC
  Administered 2016-11-30 – 2016-12-02 (×3): 5 mg via ORAL
  Filled 2016-11-21 (×3): qty 1

## 2016-11-21 MED ORDER — MAGNESIUM SULFATE 2 GM/50ML IV SOLN
2.0000 g | Freq: Once | INTRAVENOUS | Status: AC
  Administered 2016-11-21: 2 g via INTRAVENOUS
  Filled 2016-11-21: qty 50

## 2016-11-21 MED ORDER — DEXTROSE 50 % IV SOLN
INTRAVENOUS | Status: AC
  Filled 2016-11-21: qty 50

## 2016-11-21 NOTE — Progress Notes (Addendum)
Hypoglycemic Event  CBG: cbg-66  Treatment: D50 IV 25 mL @ 2355  Symptoms: Shaky  Follow-up CBG: Time:0017 CBG Result:124  Possible Reasons for Event: Inadequate meal intake  Comments/MD notified:per protocol    Diana Gardner

## 2016-11-21 NOTE — Progress Notes (Signed)
Inpatient Diabetes Program Recommendations  AACE/ADA: New Consensus Statement on Inpatient Glycemic Control (2015)  Target Ranges:  Prepandial:   less than 140 mg/dL      Peak postprandial:   less than 180 mg/dL (1-2 hours)      Critically ill patients:  140 - 180 mg/dL   Lab Results  Component Value Date   GLUCAP 81 11/21/2016    Review of Glycemic Control  Diabetes history: None noted Current orders for Inpatient glycemic control: Novolog Sensitive Q4 hours  Inpatient Diabetes Program Recommendations:   Considering glucose trends, please discontinue correction scale for now. Noted diet ordered this am. If patient continue to have hypoglycemia may consider D5 fluids. Will wait for glucose trends to consistently be elevated in order to reorder Novolog.  Thanks,  Tama Headings RN, MSN, [facility] Inpatient Diabetes Coordinator Team Pager [phone] (8a-5p)

## 2016-11-21 NOTE — Progress Notes (Signed)
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 Intubated 3/20 >> 4/2. On Unasyn. Off pressors since 3/22   Trial extubation 4/2 Right pneumothorax: 3/22 Rt VATS/right lower lobe ectomy 3/28 Pressors d/c 3/22  STUDIES:  3/21 2 d >> EF 65-70%, G1DD  CULTURES: Sputum 3/20 > neg 3/21 bc x 2>>ng 3/21 RVP>> neg u strep ag POS  ANTIBIOTICS: Azithromycin 3/18 > 3/18 Ceftriaxone 3/18 > 3/21 Vanc 3/21 >  3/21 Unasyn 3/21 >  LINES/TUBES: ETT 3/20 > 4/2 3/20 cvl>> 3/20 rad aline>>3/25 3/28 radial a line (OR) >.>out  SIGNIFICANT EVENTS  11/05/2016 admission for PNA3/20 transfer to ICU and intubated 3/21 refractory shock 3/22 > right PTX > chest tube placed. Pressors stopped 3/28 >> RT VATS ,right mini thoracotomy, drainage of empyema, decortication, right lower lobectomy. 3/29  - C/o pain rt chest Critically ill, intubated, weaning on 10/5 Afebrile 3/30 -  Weaning but tachypneic and needing higher support. On PRN sedation. Not on pressors. Afebrile 3/31- Remains needing high PS needs on weaning 4/2 - Extubated. Now on [place]  SUBJECTIVE/OVERNIGHT/INTERVAL HX Weaned successfully yest, now on [place]. Had pain ON from VATS/Chest tubes. Also with 2 hypoglycemic episodes (66, 64) but quickly improved with D50. K 3.0 Mag 1.9 Hb 8  VITAL SIGNS: Temp:  [97.2 F (36.2 C)-99 F (37.2 C)] 97.9 F (36.6 C) (04/03 0748) Pulse Rate:  [45-80] 45 (04/03 0800) Resp:  [11-30] 21 (04/03 0800) BP: (99-142)/(46-92) 120/54 (04/03 0800) SpO2:  [96 %-100 %] 98 % (04/03 0800) FiO2 (%):  [40 %] 40 % (04/02  0948) Weight:  [67.7 kg (149 lb 4 oz)] 67.7 kg (149 lb 4 oz) (04/03 0400)  PHYSICAL EXAMINATION: Gen: Chronically ill appearing female HENT: [place]/AT. PERRL. Neck supple.  Lungs: CTA BL, on [place] Heart: RRR without murmur. Not on pressors Extremities: +edema Neuro: Awake and interactive.    PULMONARY  Recent Labs Lab 11/15/16 0310 11/15/16 1605 11/16/16 0323 11/16/16 1139  PHART 7.461* 7.357 7.376 7.381  PCO2ART 45.3 48.3* 44.5 46.2  PO2ART 83.0 139.0* 270* 105.0  HCO3 32.3* 27.6 25.5 27.4  TCO2 34 29  --  29  O2SAT 97.0 99.0 99.6 98.0   CBC  Recent Labs Lab 11/19/16 0400 11/20/16 0532 11/21/16 0410  HGB 7.6* 7.7* 8.0*  HCT 24.1* 24.8* 26.2*  WBC 7.5 6.9 5.9  PLT 406* 482* 541*   COAGULATION  Recent Labs Lab 11/14/16 1714  INR 1.26   CARDIAC  No results for input(s): TROPONINI in the last 168 hours. No results for input(s): PROBNP in the last 168 hours.  CHEMISTRY  Recent Labs Lab 11/16/16 0430  11/18/16 0441 11/19/16 0400 11/20/16 0532 11/20/16 1200 11/20/16 1645 11/21/16 0410  NA 143  < >  --  144 143 145 142 141  K 3.7  < >  --  3.4* 3.2* 3.5 4.0 3.0*  CL 110  < >  --  101 95* 97* 97* 99*  CO2 27  < >  --  37* 41* 37* 37* 38*  GLUCOSE 169*  < >  --  124* 99 167* 136* 150*  BUN 36*  < >  --  32* 31* 31* 32* 27*  CREATININE 0.82  < >  --  0.64 0.67 0.86 0.67 0.59  CALCIUM 7.3*  < >  --  8.1* 8.1* 7.9* 8.1* 8.0*  MG 1.6*  --  1.9 2.3 2.0  --   --  1.9  PHOS 4.4  --  3.2 3.7 4.0  --   --  3.6  < > = values in this interval not displayed. Estimated Creatinine Clearance: 62.7 mL/min (by C-G formula based on SCr of 0.59 mg/dL).  LIVER  Recent Labs Lab 11/14/16 1714 11/17/16 0435  AST 36 18  ALT 35 20  ALKPHOS 79 49  BILITOT 0.5 0.3  PROT 5.5* 4.4*  ALBUMIN 1.1* 1.5*  INR 1.26  --    INFECTIOUS No results for input(s): LATICACIDVEN, PROCALCITON in the last 168 hours.  ENDOCRINE CBG (last 3)   Recent Labs  11/21/16 0323  11/21/16 0351 11/21/16 0746  GLUCAP 64* 154* 81   IMAGING x48h  - image(s) personally visualized  -   highlighted in bold Dg Chest Port 1 View  Result Date: 11/21/2016 CLINICAL DATA:  History of pneumothorax.  Chest tube removal. EXAM: PORTABLE CHEST 1 VIEW COMPARISON:  11/20/2016. FINDINGS: Interim extubation. Interim removal of the lateral most chest tube. Two medial most chest tube is in stable position. Left IJ line in stable position . Heart size stable. Right base subsegmental atelectasis. Perihilar atelectasis/infiltrate. Small bilateral pleural effusions. No pneumothorax. IMPRESSION: 1. Interim extubation. Interim removal of lateral most chest tube. Two medial most chest tubes in stable position. Left IJ line stable position. No pneumothorax. 2. Persistent right base subsegmental atelectasis. Mild left perihilar atelectasis/ infiltrate noted on today's exam. Small bilateral pleural effusions. Electronically Signed   By: Marcello Moores  Register   On: 11/21/2016 06:45   Dg Chest Port 1 View  Result Date: 11/20/2016 CLINICAL DATA:  Status post thoracoscopy and empyema drainage with decortication on the right. EXAM: PORTABLE CHEST 1 VIEW COMPARISON:  Portable chest x-ray of November 19, 2016 FINDINGS: There remains mild volume loss on the right. There is no pneumothorax. The right-sided chest tube tip projects over the posterior aspect of the fourth rib. A second more medially positioned chest tube has its tip approximately 2 cm lateral to the hands. A third chest tube has its tip projecting over the medial aspect of the ninth rib. The left lung is well-expanded. Minimal basilar atelectasis is suspected on the left. The cardiac silhouette is top-normal in size. The pulmonary vascularity is not engorged. The endotracheal tube tip lies approximately 3.7 cm above the carina. The esophagogastric tube tip projects below the inferior margin of the image. The left internal jugular venous catheter tip projects over the  distal third of the SVC. IMPRESSION: Fairly stable appearance of the chest. No right-sided pneumothorax or significant pleural effusion. Minimal postsurgical volume loss due to lower lobectomy. Minimal left basilar atelectasis. The support tubes are in reasonable position. Electronically Signed   By: David  Martinique M.D.   On: 11/20/2016 07:08   I reviewed CXR myself, CT noted and infiltrate noted.  DISCUSSION: 66 year old female with COPD admitted 3/18 with sepsis secondary to RLL necrotizing pneumococcal pneumonia. Course complicated by pneumothorax, hypernatremia &  rt empyema s/p lobectomy/ decortication  ASSESSMENT / PLAN:  PULMONARY A: Acute hypoxemic respiratory failure secondary to pneumonia complicated by likely aspiration. Right PTX 3/22 COPD exacerbation on admission. RLL necrotizing CAP s/p lobectomy 3/28. Rt  empyema s/p decortication  P:   - IS per RT protocol - Hold further diureses - Solumedrol 40 mg IV daily, change to prednisone 30 mg PO daily x3 days then 20 mg PO daily x3 days then 10 mg PO daily x3 days then 5 mg PO daily x3 days then D/C. -Anterior chest tube d/c 4/2 -Unasyn 3/21 >>  CARDIOVASCULAR A:  Hypotension/shock - hypovolemia and sepsis.  Pressors turned off AM 3/22 and remain off. H/o HTN - doing well not on pressors P:  - Labetalol as needed - Hold diureses  RENAL A:   AKI - secondary to hypoperfusion, NSAIDS, and ACEi.  Gradually improving. Hypokalemia - s/p repletion AM 3/23 amd 3/30 - 3.0 4/3 Volume overload - improving Hypernatremia -resolved  P:   - KVO IVF - D/C lasix - BMET in AM - Replace electrolytes as indicated  GASTROINTESTINAL A:   Nutrition. GI prophylaxis. P:   - D/C TF - Continue Protonix until off steroids - SLP to eval, patient tolerating ice chips   HEMATOLOGIC A:   Anemia, 8 today. VTE prophylaxis. P:  - Transfuse per ICU protocol - DVT prophylaxis  INFECTIOUS A:   Septic shock secondary to probable  aspiration +/- CAP - improved Unasyn start 3/21 >>  P:   - Continue unasyn, plan 2-4 weeks, will defer to CVTS  ENDOCRINE A:   Hypoglycemia ON (66, 64) and responded to amp D50 x2 SLP to eval  P:   - ISS - CBGs - PO per SLP eval  NEUROLOGIC A:   Acute metabolic encephalopathy: sepsis and acidosis; resolved 3/23.  P:   - D/C sedation - PRN fentanyl for pain.  FAMILY  - Updates: Patient updated bedside.  Discussed with PCCM-resident and bedside RN, transfer to 2W (heart floor) and to [facility] service with PCCM off 4/4, please call back if needed.  - Inter-disciplinary family meet or Palliative Care meeting due by: 3/25  Rush Farmer, M.D. [facility] Pulmonary/Critical Care Medicine. Pager: [phone]. After hours pager: [phone].   11/21/2016 8:46 AM

## 2016-11-21 NOTE — Progress Notes (Signed)
Patient transferred to 2W17 by Otila Kluver, [place] with all clothing and pocket book. Family aware of patients transfer.

## 2016-11-21 NOTE — Progress Notes (Signed)
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 Intubated 3/20 >> 4/2. On Unasyn. Off pressors since 3/22   Trial extubation 4/2 Right pneumothorax: 3/22 Rt VATS/right lower lobe ectomy 3/28 Pressors d/c 3/22  STUDIES:  3/21 2 d >> EF 65-70%, G1DD  CULTURES: Sputum 3/20 > neg 3/21 bc x 2>>ng 3/21 RVP>> neg u strep ag POS  ANTIBIOTICS: Azithromycin 3/18 > 3/18 Ceftriaxone 3/18 > 3/21 Vanc 3/21 >  3/21 Unasyn 3/21 >  LINES/TUBES: ETT 3/20 > 4/2 3/20 cvl>> 3/20 rad aline>>3/25 3/28 radial a line (OR) >.>out  SIGNIFICANT EVENTS  11/05/2016 admission for PNA3/20 transfer to ICU and intubated 3/21 refractory shock 3/22 > right PTX > chest tube placed. Pressors stopped 3/28 >> RT VATS ,right mini thoracotomy, drainage of empyema, decortication, right lower lobectomy. 3/29  - C/o pain rt chest Critically ill, intubated, weaning on 10/5 Afebrile 3/30 -  Weaning but tachypneic and needing higher support. On PRN sedation. Not on pressors. Afebrile 3/31- Remains needing high PS needs on weaning 4/2 - Extubated. Now on [place]  SUBJECTIVE/OVERNIGHT/INTERVAL HX Weaned successfully yest, now on [place]. Had pain ON from VATS/Chest tubes. Also with 2 hypoglycemic episodes (66, 64) but quickly improved with D50. K 3.0 Mag 1.9 Hb 8  VITAL SIGNS: Temp:  [97.2 F (36.2 C)-99 F (37.2 C)] 97.9 F (36.6 C) (04/03 0748) Pulse Rate:  [56-80] 56 (04/03 0700) Resp:  [11-30] 15 (04/03 0700) BP: (99-142)/(46-92) 99/46 (04/03 0700) SpO2:  [96 %-100 %] 99 % (04/03 0700) FiO2 (%):  [40 %] 40 % (04/02  0948) Weight:  [149 lb 4 oz (67.7 kg)] 149 lb 4 oz (67.7 kg) (04/03 0400)  PHYSICAL EXAMINATION: Gen: Chronically ill appearing female HENT: [place]/AT. PERRL. Neck supple.  Lungs: CTA BL, on [place] Heart: RRR without murmur. Not on pressors Extremities: +edema Neuro: Awake and interactive.    PULMONARY  Recent Labs Lab 11/15/16 0310 11/15/16 1605 11/16/16 0323 11/16/16 1139  PHART 7.461* 7.357 7.376 7.381  PCO2ART 45.3 48.3* 44.5 46.2  PO2ART 83.0 139.0* 270* 105.0  HCO3 32.3* 27.6 25.5 27.4  TCO2 34 29  --  29  O2SAT 97.0 99.0 99.6 98.0   CBC  Recent Labs Lab 11/19/16 0400 11/20/16 0532 11/21/16 0410  HGB 7.6* 7.7* 8.0*  HCT 24.1* 24.8* 26.2*  WBC 7.5 6.9 5.9  PLT 406* 482* 541*   COAGULATION  Recent Labs Lab 11/14/16 1714  INR 1.26   CARDIAC  No results for input(s): TROPONINI in the last 168 hours. No results for input(s): PROBNP in the last 168 hours.  CHEMISTRY  Recent Labs Lab 11/16/16 0430  11/18/16 0441 11/19/16 0400 11/20/16 0532 11/20/16 1200 11/20/16 1645 11/21/16 0410  NA 143  < >  --  144 143 145 142 141  K 3.7  < >  --  3.4* 3.2* 3.5 4.0 3.0*  CL 110  < >  --  101 95* 97* 97* 99*  CO2 27  < >  --  37* 41* 37* 37* 38*  GLUCOSE 169*  < >  --  124* 99 167* 136* 150*  BUN 36*  < >  --  32* 31* 31* 32* 27*  CREATININE 0.82  < >  --  0.64 0.67 0.86 0.67 0.59  CALCIUM 7.3*  < >  --  8.1* 8.1* 7.9* 8.1* 8.0*  MG 1.6*  --  1.9 2.3 2.0  --   --  1.9  PHOS 4.4  --  3.2 3.7 4.0  --   --  3.6  < > = values in this interval not displayed. Estimated Creatinine Clearance: 62.7 mL/min (by C-G formula based on SCr of 0.59 mg/dL).  LIVER  Recent Labs Lab 11/14/16 1714 11/17/16 0435  AST 36 18  ALT 35 20  ALKPHOS 79 49  BILITOT 0.5 0.3  PROT 5.5* 4.4*  ALBUMIN 1.1* 1.5*  INR 1.26  --    INFECTIOUS No results for input(s): LATICACIDVEN, PROCALCITON in the last 168 hours.  ENDOCRINE CBG (last 3)   Recent Labs  11/21/16 0323  11/21/16 0351 11/21/16 0746  GLUCAP 64* 154* 81   IMAGING x48h  - image(s) personally visualized  -   highlighted in bold Dg Chest Port 1 View  Result Date: 11/21/2016 CLINICAL DATA:  History of pneumothorax.  Chest tube removal. EXAM: PORTABLE CHEST 1 VIEW COMPARISON:  11/20/2016. FINDINGS: Interim extubation. Interim removal of the lateral most chest tube. Two medial most chest tube is in stable position. Left IJ line in stable position . Heart size stable. Right base subsegmental atelectasis. Perihilar atelectasis/infiltrate. Small bilateral pleural effusions. No pneumothorax. IMPRESSION: 1. Interim extubation. Interim removal of lateral most chest tube. Two medial most chest tubes in stable position. Left IJ line stable position. No pneumothorax. 2. Persistent right base subsegmental atelectasis. Mild left perihilar atelectasis/ infiltrate noted on today's exam. Small bilateral pleural effusions. Electronically Signed   By: Marcello Moores  Register   On: 11/21/2016 06:45   Dg Chest Port 1 View  Result Date: 11/20/2016 CLINICAL DATA:  Status post thoracoscopy and empyema drainage with decortication on the right. EXAM: PORTABLE CHEST 1 VIEW COMPARISON:  Portable chest x-ray of November 19, 2016 FINDINGS: There remains mild volume loss on the right. There is no pneumothorax. The right-sided chest tube tip projects over the posterior aspect of the fourth rib. A second more medially positioned chest tube has its tip approximately 2 cm lateral to the hands. A third chest tube has its tip projecting over the medial aspect of the ninth rib. The left lung is well-expanded. Minimal basilar atelectasis is suspected on the left. The cardiac silhouette is top-normal in size. The pulmonary vascularity is not engorged. The endotracheal tube tip lies approximately 3.7 cm above the carina. The esophagogastric tube tip projects below the inferior margin of the image. The left internal jugular venous catheter tip projects over the  distal third of the SVC. IMPRESSION: Fairly stable appearance of the chest. No right-sided pneumothorax or significant pleural effusion. Minimal postsurgical volume loss due to lower lobectomy. Minimal left basilar atelectasis. The support tubes are in reasonable position. Electronically Signed   By: David  Martinique M.D.   On: 11/20/2016 07:08   DISCUSSION: 66 year old female with COPD admitted 3/18 with sepsis secondary to RLL necrotizing pneumococcal pneumonia. Course complicated by pneumothorax, hypernatremia &  rt empyema s/p lobectomy/ decortication  ASSESSMENT / PLAN:  PULMONARY A: Acute hypoxemic respiratory failure secondary to pneumonia complicated by likely aspiration. Right PTX 3/22 COPD exacerbation on admission. RLL necrotizing CAP s/p lobectomy 3/28. Rt empyema s/p decortication  P:   -Extubated, on [place],  Titrate O2 for sat of 88-92% -Hold further diureses -Solumedrol 40 mg IV daily, will continue for now. -Anterior chest tube d/c 4/2 -Unasyn 3/21 >>  CARDIOVASCULAR A:  Hypotension/shock - hypovolemia and sepsis.  Pressors turned off AM 3/22 and remain off. H/o HTN - doing well not on pressors P:  - Labetalol as needed - Hold diureses  RENAL A:   AKI - secondary to hypoperfusion, NSAIDS, and ACEi.  Gradually improving. Hypokalemia - s/p repletion AM 3/23 amd 3/30 - 3.0 4/3 Volume overload - improving Hypernatremia -resolved  P:   - KVO IVF - D/C lasix - BMET in AM - IV KCl 30 mEq x2  GASTROINTESTINAL A:   Nutrition. GI prophylaxis. P:   - D/C TF - Continue Protonix - SLP to eval  HEMATOLOGIC A:   Anemia, 8 today. VTE prophylaxis. P:  - Transfuse per ICU protocol - DVT prophylaxis  INFECTIOUS A:   Septic shock secondary to probable aspiration +/- CAP - improved Unasyn start 3/21 >>  P:   -Continue unasyn, plan 2-4 weeks  ENDOCRINE A:   Hypoglycemia ON (66, 64) and responded to amp D50 x2 SLP to eval  P:   -ISS -CBGs -PO per SLP  eval  NEUROLOGIC A:   Acute metabolic encephalopathy: sepsis and acidosis; resolved 3/23.  P:   - D/C sedation - PRN fentanyl for pain.  FAMILY  - Updates: No family bedside, patient updated  - Inter-disciplinary family meet or Palliative Care meeting due by: 3/25  The patient is critically ill with multiple organ systems failure and requires high complexity decision making for assessment and support, frequent evaluation and titration of therapies, application of advanced monitoring technologies and extensive interpretation of multiple databases.   11/21/2016 7:59 AM

## 2016-11-21 NOTE — Progress Notes (Signed)
PT Cancellation Note  Patient Details Name: Diana Gardner MRN: [id number] DOB: December 13, 1950   Cancelled Treatment:    Reason Eval/Treat Not Completed: Patient at procedure or test/unavailable. RN removing chest tube. PT will follow up as able.   Tracie Harrier, [place] [facility] SPT [phone]   Tracie Harrier 11/21/2016, 9:40 AM

## 2016-11-21 NOTE — Progress Notes (Signed)
Rt instructed pt and family on use of flutter valve and incentive spirometer.  Pt able to demonstrate back weak effort using the flutter valve, stating she feels a little short of breath.  Neb treatment given.  Family encouraged to help coach pt using both devices.

## 2016-11-21 NOTE — Progress Notes (Signed)
Hypoglycemic Event  CBG: 64  Treatment: D50 IV 25 mL  Symptoms: Shaky  Follow-up CBG: Time:0341 CBG Result:154  Possible Reasons for Event: Inadequate meal intake  Comments/MD notified:per protocol    Dillard Essex

## 2016-11-21 NOTE — Progress Notes (Signed)
6 Days Post-Op Procedure(s) (LRB): VIDEO BRONCHOSCOPY (N/A) VIDEO ASSISTED THORACOSCOPY (VATS), MINI THORACOTOMY, DRAINAGE OF EMPYEMA, DECORTICATION, RIGHT LOWER LOBE RESECTION (Right) Subjective: Extubated yesterday Some incisional pain but well controlled with pain meds  Objective: Vital signs in last 24 hours: Temp:  [97.2 F (36.2 C)-99 F (37.2 C)] 97.9 F (36.6 C) (04/03 0748) Pulse Rate:  [45-80] 45 (04/03 0800) Cardiac Rhythm: Normal sinus rhythm;Sinus bradycardia (04/03 0800) Resp:  [11-30] 21 (04/03 0800) BP: (99-142)/(46-92) 120/54 (04/03 0800) SpO2:  [96 %-100 %] 98 % (04/03 0800) FiO2 (%):  [40 %] 40 % (04/02 0948) Weight:  [149 lb 4 oz (67.7 kg)] 149 lb 4 oz (67.7 kg) (04/03 0400)  Hemodynamic parameters for last 24 hours:    Intake/Output from previous day: 04/02 0701 - 04/03 0700 In: 1167.1 [P.O.:22; I.V.:70; NG/GT:493.5; IV Piggyback:476.6] Out: 3825 [Urine:3625; Chest Tube:200] Intake/Output this shift: Total I/O In: 88.3 [IV Piggyback:88.3] Out: 120 [Urine:20; Chest Tube:100]  General appearance: alert, cooperative and no distress Neurologic: intact Heart: regular rate and rhythm Lungs: diminished breath sounds right base no air leak, serosanguinous drainage  Lab Results:  Recent Labs  11/20/16 0532 11/21/16 0410  WBC 6.9 5.9  HGB 7.7* 8.0*  HCT 24.8* 26.2*  PLT 482* 541*   BMET:  Recent Labs  11/20/16 1645 11/21/16 0410  NA 142 141  K 4.0 3.0*  CL 97* 99*  CO2 37* 38*  GLUCOSE 136* 150*  BUN 32* 27*  CREATININE 0.67 0.59  CALCIUM 8.1* 8.0*    PT/INR: No results for input(s): LABPROT, INR in the last 72 hours. ABG    Component Value Date/Time   PHART 7.381 11/16/2016 1139   HCO3 27.4 11/16/2016 1139   TCO2 29 11/16/2016 1139   ACIDBASEDEF 12.0 (H) 11/07/2016 2005   O2SAT 98.0 11/16/2016 1139   CBG (last 3)   Recent Labs  11/21/16 0323 11/21/16 0351 11/21/16 0746  GLUCAP 64* 154* 81    Assessment/Plan: S/P  Procedure(s) (LRB): VIDEO BRONCHOSCOPY (N/A) VIDEO ASSISTED THORACOSCOPY (VATS), MINI THORACOTOMY, DRAINAGE OF EMPYEMA, DECORTICATION, RIGHT LOWER LOBE RESECTION (Right)  Extubated yesterday No air leak and minimal drainage- will dc the more anterior (diaphragmatic) tube this AM Leave remaining tube to water seal Mobilize   LOS: 16 days    [place] Nakayama 11/21/2016

## 2016-11-22 ENCOUNTER — Encounter (HOSPITAL_COMMUNITY): Payer: Self-pay

## 2016-11-22 ENCOUNTER — Inpatient Hospital Stay (HOSPITAL_COMMUNITY): Payer: Medicare Other

## 2016-11-22 DIAGNOSIS — J869 Pyothorax without fistula: Secondary | ICD-10-CM | POA: Diagnosis present

## 2016-11-22 DIAGNOSIS — J181 Lobar pneumonia, unspecified organism: Secondary | ICD-10-CM

## 2016-11-22 LAB — BLOOD GAS, ARTERIAL
ACID-BASE EXCESS: 6.3 mmol/L — AB (ref 0.0–2.0)
BICARBONATE: 32.1 mmol/L — AB (ref 20.0–28.0)
Drawn by: 441661
FIO2: 40
O2 SAT: 82.1 %
PH ART: 7.334 — AB (ref 7.350–7.450)
Patient temperature: 97.5
pCO2 arterial: 61.4 mmHg — ABNORMAL HIGH (ref 32.0–48.0)
pO2, Arterial: 51.6 mmHg — ABNORMAL LOW (ref 83.0–108.0)

## 2016-11-22 LAB — GLUCOSE, CAPILLARY
GLUCOSE-CAPILLARY: 115 mg/dL — AB (ref 65–99)
GLUCOSE-CAPILLARY: 113 mg/dL — AB (ref 65–99)
GLUCOSE-CAPILLARY: 169 mg/dL — AB (ref 65–99)
Glucose-Capillary: 112 mg/dL — ABNORMAL HIGH (ref 65–99)
Glucose-Capillary: 113 mg/dL — ABNORMAL HIGH (ref 65–99)

## 2016-11-22 LAB — CBC
HEMATOCRIT: 27.4 % — AB (ref 36.0–46.0)
HEMOGLOBIN: 8.3 g/dL — AB (ref 12.0–15.0)
MCH: 27.5 pg (ref 26.0–34.0)
MCHC: 30.3 g/dL (ref 30.0–36.0)
MCV: 90.7 fL (ref 78.0–100.0)
Platelets: 545 10*3/uL — ABNORMAL HIGH (ref 150–400)
RBC: 3.02 MIL/uL — ABNORMAL LOW (ref 3.87–5.11)
RDW: 17.9 % — ABNORMAL HIGH (ref 11.5–15.5)
WBC: 6.5 10*3/uL (ref 4.0–10.5)

## 2016-11-22 LAB — BASIC METABOLIC PANEL
ANION GAP: 4 — AB (ref 5–15)
BUN: 22 mg/dL — ABNORMAL HIGH (ref 6–20)
CO2: 34 mmol/L — AB (ref 22–32)
CREATININE: 0.61 mg/dL (ref 0.44–1.00)
Calcium: 8 mg/dL — ABNORMAL LOW (ref 8.9–10.3)
Chloride: 102 mmol/L (ref 101–111)
GFR calc non Af Amer: >60 mL/min (ref >60)
Glucose, Bld: 97 mg/dL (ref 65–99)
Potassium: 4.6 mmol/L (ref 3.5–5.1)
Sodium: 140 mmol/L (ref 135–145)

## 2016-11-22 LAB — MAGNESIUM: MAGNESIUM: 2 mg/dL (ref 1.7–2.4)

## 2016-11-22 LAB — PHOSPHORUS: PHOSPHORUS: 2.8 mg/dL (ref 2.5–4.6)

## 2016-11-22 MED ORDER — ENSURE ENLIVE PO LIQD
237.0000 mL | Freq: Two times a day (BID) | ORAL | Status: DC
  Administered 2016-11-23 – 2016-12-04 (×14): 237 mL via ORAL
  Filled 2016-11-22: qty 237

## 2016-11-22 MED ORDER — GUAIFENESIN ER 600 MG PO TB12
1200.0000 mg | ORAL_TABLET | Freq: Two times a day (BID) | ORAL | Status: AC
  Administered 2016-11-22 – 2016-11-26 (×10): 1200 mg via ORAL
  Filled 2016-11-22 (×10): qty 2

## 2016-11-22 MED ORDER — FUROSEMIDE 10 MG/ML IJ SOLN
40.0000 mg | Freq: Once | INTRAMUSCULAR | Status: AC
  Administered 2016-11-22: 40 mg via INTRAVENOUS
  Filled 2016-11-22: qty 4

## 2016-11-22 MED ORDER — POTASSIUM CHLORIDE CRYS ER 20 MEQ PO TBCR: 20.0000 meq | EXTENDED_RELEASE_TABLET | Freq: Once | ORAL | Status: DC

## 2016-11-22 MED ORDER — FUROSEMIDE 10 MG/ML IJ SOLN
20.0000 mg | Freq: Once | INTRAMUSCULAR | Status: AC
  Administered 2016-11-22: 20 mg via INTRAVENOUS
  Filled 2016-11-22: qty 2

## 2016-11-22 MED ORDER — GERHARDT'S BUTT CREAM
TOPICAL_CREAM | CUTANEOUS | Status: DC | PRN
  Filled 2016-11-22: qty 1

## 2016-11-22 NOTE — Progress Notes (Signed)
PULMONARY  / CRITICAL CARE MEDICINE  Name: Diana Gardner MRN: [id number] DOB: Aug 28, 1950    LOS: 36  REFERRING MD :  [facility]  CHIEF COMPLAINT:  Acute on chronic hypercarbic respiratory failure  BRIEF PATIENT DESCRIPTION: Diana Gardner is a 66 year old woman with COPD on home oxygen hospitalized for RLL necrotizing CAP complicated by pneumothorax and empyema s/p VATS and lobectomy who developed acute on chronic hypercarbic respiratory failure.  LINES / TUBES:  3/20 Radial a-line >> 3/25 3/28 Chest tube   CULTURES: 3/20 Tracheal aspirate >> NGTD x 2 days 3/21 Respiratory viral panel negative 3/21 Blood cultures x 2 >> NGTD x 5 days 3/28 R pleural fluid NGTD x 5 days  ANTIBIOTICS: Azithromycin 3/18  Ceftriaxone 3/18 >> 3/21 Vanc 3/21 >> 3/21 Unasyn 3/21  SIGNIFICANT EVENTS:  3/18 Admitted 3/20 transfer to ICU and intubated 3/21 refractory shock 3/22 Right PTX > chest tube placed. Pressors stopped 3/28 VATS, right mini thoracotomy, drainage of empyema, decortication, right lower lobectomy. 4/02 Extubated 4/03 Re-admitted to ICU for acute hypercarbic respiratory failure. Improved on BiPAP.  DIET:  Heart Healthy/Carb Mod  DVT Px:  SCDs & enoxaparin  GI Px:  Pantoprazole  HISTORY OF PRESENT ILLNESS:  66 year old never smoker with COPD due to second hand smoke exposure who presents to the hospital with the fever and productive cough.  Found to have an infiltrate on CXR, determined to be CAP.  There was a concern for pleural effusion and patient PCCM was consulted for empyema.  Patient has base line COPD and is 2-3 liter O2 dependent at home on combivent and albuterol.  PAST MEDICAL HISTORY :  Past Medical History:  Diagnosis Date  . COPD (chronic obstructive pulmonary disease) ([place])   . Hypertension    Past Surgical History:  Procedure Laterality Date  . APPENDECTOMY    . VIDEO ASSISTED THORACOSCOPY (VATS)/EMPYEMA Right 11/15/2016   Procedure: VIDEO ASSISTED THORACOSCOPY  (VATS), MINI THORACOTOMY, DRAINAGE OF EMPYEMA, DECORTICATION, RIGHT LOWER LOBE RESECTION;  Surgeon: Melrose Nakayama, MD;  Location: [facility];  Service: Thoracic;  Laterality: Right;  Marland Kitchen VIDEO BRONCHOSCOPY N/A 11/15/2016   Procedure: VIDEO BRONCHOSCOPY;  Surgeon: Melrose Nakayama, MD;  Location: [facility];  Service: Thoracic;  Laterality: N/A;   Prior to Admission medications   Medication Sig Start Date End Date Taking? Authorizing Provider  albuterol (PROVENTIL) (2.5 MG/3ML) 0.083% nebulizer solution 1 neb three times daily as needed 01/12/16  Yes Historical Provider, MD  ALPRAZolam (XANAX) 0.25 MG tablet Take 1-2 tablets by mouth daily as needed for anxiety.  11/01/15  Yes Historical Provider, MD  atorvastatin (LIPITOR) 20 MG tablet Take 20 mg by mouth daily.   Yes Historical Provider, MD  COMBIVENT RESPIMAT 20-100 MCG/ACT AERS respimat Inhale 1 puff into the lungs 4 (four) times daily. 09/19/15  Yes Historical Provider, MD  lisinopril-hydrochlorothiazide (PRINZIDE,ZESTORETIC) 20-25 MG tablet Take 1 tablet by mouth daily. 09/21/15  Yes Historical Provider, MD  metoprolol (LOPRESSOR) 100 MG tablet Take 1 tablet by mouth 2 (two) times daily. 10/30/15  Yes Historical Provider, MD   Allergies  Allergen Reactions  . No Known Allergies     FAMILY HISTORY:  Family History  Problem Relation Age of Onset  . Emphysema Mother   . Heart attack Father    SOCIAL HISTORY:  reports that she has never smoked. She has never used smokeless tobacco. She reports that she drinks alcohol. Her drug history is not on file.  INTERVAL HISTORY: This morning,  she is still on BiPAP though was switched to pressure control from pressure support after she became more dyspneic off BiPAP for about 30 minutes. Her daughter was at bedside who recalls no prior cardiac history.   VITAL SIGNS: Temp:  [97.2 F (36.2 C)-98 F (36.7 C)] 97.9 F (36.6 C) (04/04 1159) Pulse Rate:  [61-73] 73 (04/04 1200) Resp:  [12-30] 26 (04/04  1200) BP: (110-198)/(49-73) 129/55 (04/04 1200) SpO2:  [88 %-100 %] 94 % (04/04 1233) FiO2 (%):  [40 %-60 %] 40 % (04/04 1233) Weight:  [141 lb 8.6 oz (64.2 kg)-150 lb 12.7 oz (68.4 kg)] 141 lb 8.6 oz (64.2 kg) (04/04 0600)   VENTILATOR SETTINGS: Vent Mode: BIPAP;PCV FiO2 (%):  [40 %-60 %] 40 % Set Rate:  [8 bmp] 8 bmp PEEP:  [6 cmH20-7 cmH20] 7 cmH20 Pressure Support:  [12 cmH20] 12 cmH20 Plateau Pressure:  [0 cmH20] 0 cmH20  INTAKE / OUTPUT: Intake/Output      04/03 0701 - 04/04 0700 04/04 0701 - 04/05 0700   P.O. 240    I.V. (mL/kg)     Other     NG/GT     IV Piggyback 703.2 100   Total Intake(mL/kg) 943.2 (14.7) 100 (1.6)   Urine (mL/kg/hr) 477 (0.3) 1350 (3.6)   Stool 2 (0) 0 (0)   Chest Tube 120 (0.1) 0 (0)   Total Output 599 1350   Net +344.2 -1250        Urine Occurrence 1 x    Stool Occurrence 1 x 2 x     PHYSICAL EXAMINATION: .Physical Exam  Constitutional: She is oriented to person, place, and time. No distress.  HENT:  Head: Normocephalic and atraumatic.  Eyes: Conjunctivae are normal. No scleral icterus.  Neck:  Wearing BiPAP masks  Cardiovascular: Normal rate.   Pulmonary/Chest: No respiratory distress.  Machine sounds in the anterior lung fields  Abdominal: Soft. She exhibits no distension.  Neurological: She is alert and oriented to person, place, and time.  Skin: She is not diaphoretic.    LABS: Cbc  Recent Labs Lab 11/20/16 0532 11/21/16 0410 11/22/16 0340  WBC 6.9 5.9 6.5  HGB 7.7* 8.0* 8.3*  HCT 24.8* 26.2* 27.4*  PLT 482* 541* 545*    Chemistry   Recent Labs Lab 11/20/16 0532  11/20/16 1645 11/21/16 0410 11/22/16 0340  NA 143  < > 142 141 140  K 3.2*  < > 4.0 3.0* 4.6  CL 95*  < > 97* 99* 102  CO2 41*  < > 37* 38* 34*  BUN 31*  < > 32* 27* 22*  CREATININE 0.67  < > 0.67 0.59 0.61  CALCIUM 8.1*  < > 8.1* 8.0* 8.0*  MG 2.0  --   --  1.9 2.0  PHOS 4.0  --   --  3.6 2.8  GLUCOSE 99  < > 136* 150* 97  < > = values in  this interval not displayed.  Liver fxn  Recent Labs Lab 11/17/16 0435  AST 18  ALT 20  ALKPHOS 49  BILITOT 0.3  PROT 4.4*  ALBUMIN 1.5*   ABG  Recent Labs Lab 11/15/16 1605 11/16/16 0323 11/16/16 1139 11/22/16 0506  PHART 7.357 7.376 7.381 7.334*  PCO2ART 48.3* 44.5 46.2 61.4*  PO2ART 139.0* 270* 105.0 51.6*  HCO3 27.6 25.5 27.4 32.1*  TCO2 29  --  29  --     CBG trend  Recent Labs Lab 11/21/16 1623 11/21/16 2122 11/22/16 0459 11/22/16  0755 11/22/16 1157  GLUCAP 226* 169* 115* 112* 113*    IMAGING: I reviewed and compared with 4/3. Increasing opacities of the right lung.  DIAGNOSES: Principal Problem:   Sepsis ([place]) Active Problems:   Chronic obstructive pulmonary disease (HCC)   Acute hypoxemic respiratory failure (H[facility])   Acute renal failure (ARF) ([facility])   Hyponatremia   Microcytic anemia   Hyperkalemia   Chest tube in place   Primary spontaneous pneumothorax   S/P lobectomy of lung   ASSESSMENT / PLAN:  Acute on chronic hypercarbic respiratory failure: CXR findings suggest pulmonary edema though but now net -1.3L today [net -7.6L since admission] with IV Lasix. Most recent echo 3/21 without findings of reduced ejection fraction or valvular disease. Other contributory conditions include COPD, deconditioning, pain from chest tube. -Continue Duonebs every 6 hours as needed -Agree with incentive spirometry -Attempt to wean off BiPAP later this afternoon and use as needed  CLINICAL SUMMARY: Diana Gardner is a 66 year old woman with COPD on home oxygen hospitalized for RLL necrotizing CAP complicated by pneumothorax and empyema s/p VATS and lobectomy who developed acute on chronic hypercarbic respiratory failure.  Charlott Rakes, PGY3 Internal Medicine Pager: [phone] 11/22/2016, 12:53 PM   Attending Note:  I have examined patient, reviewed labs, studies and notes. I have discussed the case with Dr Posey Pronto, and I agree with the data and plans as  amended above. 66 year old woman with a history of COPD admitted with right lower lobe necrotizing pneumonia that resulted in a complicated right pleural space. She underwent VATS decortication and right lower lobectomy. Subsequent course has been complicated by deconditioning and respiratory failure. She moved out of the ICU on 4/3 but returned early 4/4 with respiratory distress, tachypnea, hypoxemia. She improved with BiPAP. She was empirically diuresed. Suspect that her decompensation was multifactorial in nature-atelectasis, pain from chest tube, severe COPD, possibly some component of volume overload. On my evaluation this morning she was comfortable on BiPAP. Later this afternoon when I saw her again she had been removed from BiPAP and was tolerating nasal cannula oxygen without any respiratory distress. Her lungs are coarse bilaterally she does have a chest tube in place without air leak. Heart is regular. No significant edema. I will change her BiPAP to when necessary. We will continue her prednisone taper as ordered on 4/3. I will dose her Lasix daily, she has received 60 mg today.  Independent critical care time is 35 minutes.   Baltazar Apo, MD, PhD 11/22/2016, 4:29 PM  Pulmonary and Critical Care [phone] or if no answer [phone]

## 2016-11-22 NOTE — Progress Notes (Signed)
Nutrition Follow-up  INTERVENTION:   Ensure Enlive po BID, each supplement provides 350 kcal and 20 grams of protein Magic cup TID with meals, each supplement provides 290 kcal and 9 grams of protein  NUTRITION DIAGNOSIS:   Inadequate oral intake related to poor appetite (SOB) as evidenced by meal completion < 50%. Ongoing.   GOAL:   Patient will meet greater than or equal to 90% of their needs Progressing.   ASSESSMENT:   66 year old female with COPD admitted 3/18 with sepsis secondary to pneumonia. She began to suffer respiratory failure and was started on BiPAP ultimately failing requiring transfer to ICU 3/20 where she was intubated and and started on vasoactive medications.   4/2 pt extubated 4/3 diet advanced, Meal Completion: 50%  Pt had transferred out of the ICU but had trouble breathing overnight tx to ICU and has required BiPAP Spoke with pt and daughter. Poor appetite even before breathing issues last night. She was drinking better than eating though. Pt with BiPAP on, per discussion with RN will attempt to take pt of BiPAP this afternoon.  Pt willing to try supplements when able.   Weight 141 lb, now negative 7 L after high weight of 173 lb  Diet Order:  Diet heart healthy/carb modified Room service appropriate? Yes; Fluid consistency: Thin  Skin:   (MASD)  Last BM:  4/3  Height:   Ht Readings from Last 1 Encounters:  11/22/16 5\' 4"  (1.626 m)    Weight:   Wt Readings from Last 1 Encounters:  11/22/16 141 lb 8.6 oz (64.2 kg)    Ideal Body Weight:  58 kg  BMI:  Body mass index is 24.29 kg/m.  Estimated Nutritional Needs:   Kcal:  1600-1800  Protein:  90-100 grams   Fluid:  > 1.6 L/day  EDUCATION NEEDS:   No education needs identified at this time  [place], [place], [place] Pager [phone] After Hours Pager

## 2016-11-22 NOTE — Progress Notes (Addendum)
Pt called out stating that she had trouble breathing around 0506. Her vitals were taken by tech earlier at 0443 and she did not complain of any complications. Upon entry into the room another set of vitals were taken with pt's O2 saturation was 78% on 2L 02 and BP had increased with labored breathing and SOB. Rapid response was called. Respiratory was also notified. Breathing treatment given. ABG was taken, chest xray performed, HTN medication given. MD notified and orders to transfer and put on Bipap. Pt's oxygen increase while on bipap. Pt transferred to [facility]. Report called and given. Spoke with pt's daughter Magda Paganini and notified her of transfer and updated her on her mother. Marland Kitchen

## 2016-11-22 NOTE — Progress Notes (Addendum)
Progress Note    Diana Gardner  BHA:[id number] DOB: 14-May-1951  DOA: 11/05/2016 PCP: [name], MD    Brief Narrative:   Chief complaint: Pneumonia, COPD, pleural effusion.  Medical records reviewed and are as summarized below:  Diana Gardner is an 66 y.o. female with a PMH of COPD, chronic respiratory failure on home oxygen who was admitted 11/05/16 for a right lower lobe necrotizing community-acquired pneumonia complicated by pneumothorax and empyema status post right facets and right lower lobectomy on 11/15/16. She was intubated from 11/07/16 arrow forward 11/20/16 and under the care of the critical care team. She also required pressor support, which was weaned 11/09/16. Care transferred to [facility] 11/22/16.  Assessment/Plan:   Principal Problem:   Sepsis (HCC)/Septic shock Admitted and placed on empiric Rocephin/azithromycin. Antibiotics broadened to vancomycin and Unasyn on 11/08/16. Sputum cultures on admission were negative. Blood cultures were negative. Respiratory virus panel was negative. Urinary strep antigen was positive. Pressors were weaned 11/09/16. Remains on Unasyn. Critical care team recommended a 2-4 week course of Unasyn.  Active Problems:   Chronic obstructive pulmonary disease (HCC) with acute exacerbation Initially placed on IV steroids which have been weaned to oral prednisone with taper. Continue bronchodilators. Continues to require BiPAP.    Acute on chronic hypoxemic respiratory failure (HCC) complicated by primary spontaneous right pneumothorax requiring chest tube placement, status post right lower lobectomy Intubated on admission. Chest tube placed 11/09/16 secondary to right pneumothorax. Underwent a right facets and right mini thoracotomy with drainage of empyema, decortication and right lower lobectomy on 11/15/16. Extubated 11/20/16 to nasal cannula. Anterior chest tube discontinued 11/20/16. Continue nebs. Respiratory status remains tenuous, currently on  BiPAP. Given a dose of Lasix by the critical care team. Chest x-ray personally reviewed and shows increased interstitial opacities consistent with edema.    Acute renal failure (ARF) (HCC) Secondary to hypoperfusion in the setting of NSAID and ACE inhibitor use.    Hyponatremia  Resolved.    Microcytic anemia Hemoglobin stable at 8.3 this morning.    Hypokalemia/Hyperkalemia Potassium currently within normal limits.    Hyperlipidemia Continue Lipitor.    Hypertension Continue metoprolol and when necessary labetalol.    Family Communication/Anticipated D/C date and plan/Code Status   DVT prophylaxis: Lovenox ordered. Code Status: Full Code.  Family Communication: Daughter at the bedside. Disposition Plan: Home versus SNF depending on progress.   Medical Consultants:    Pulmonology  CVTS    Procedures:    None  Anti-Infectives:   Anti-infectives    Start     Dose/Rate Route Frequency Ordered Stop   11/16/16 0000  vancomycin (VANCOCIN) IVPB 1000 mg/200 mL premix     1,000 mg 200 mL/hr over 60 Minutes Intravenous Every 12 hours 11/15/16 1711 11/16/16 0100   11/15/16 0715  vancomycin (VANCOCIN) IVPB 1000 mg/200 mL premix     1,000 mg 200 mL/hr over 60 Minutes Intravenous On call to O.R. 11/15/16 0706 11/15/16 1315   11/10/16 0142  Ampicillin-Sulbactam (UNASYN) 3 g in sodium chloride 0.9 % 100 mL IVPB     3 g 200 mL/hr over 30 Minutes Intravenous Every 8 hours 11/09/16 1809     11/08/16 1700  Ampicillin-Sulbactam (UNASYN) 3 g in sodium chloride 0.9 % 100 mL IVPB  Status:  Discontinued     3 g 200 mL/hr over 30 Minutes Intravenous Every 12 hours 11/08/16 1632 11/09/16 1809   11/07/16 2100  vancomycin (VANCOCIN) IVPB 1000 mg/200 mL premix  Status:  Discontinued     1,000 mg 200 mL/hr over 60 Minutes Intravenous Every 48 hours 11/05/16 2144 11/06/16 0347   11/06/16 2100  cefTRIAXone (ROCEPHIN) 1 g in dextrose 5 % 50 mL IVPB  Status:  Discontinued     1 g 100  mL/hr over 30 Minutes Intravenous Every 24 hours 11/05/16 2326 11/08/16 1609   11/06/16 2100  azithromycin (ZITHROMAX) tablet 500 mg  Status:  Discontinued     500 mg Oral Every 24 hours 11/05/16 2326 11/07/16 1452   11/05/16 2200  azithromycin (ZITHROMAX) 500 mg in dextrose 5 % 250 mL IVPB     500 mg 250 mL/hr over 60 Minutes Intravenous  Once 11/05/16 2154 11/05/16 2315   11/05/16 1945  vancomycin (VANCOCIN) IVPB 1000 mg/200 mL premix     1,000 mg 200 mL/hr over 60 Minutes Intravenous  Once 11/05/16 1944 11/05/16 2209   11/05/16 1930  cefTRIAXone (ROCEPHIN) 1 g in dextrose 5 % 50 mL IVPB     1 g 100 mL/hr over 30 Minutes Intravenous  Once 11/05/16 1926 11/05/16 2023   11/05/16 1930  azithromycin (ZITHROMAX) 500 mg in dextrose 5 % 250 mL IVPB  Status:  Discontinued     500 mg 250 mL/hr over 60 Minutes Intravenous  Once 11/05/16 1926 11/05/16 2014      Subjective:   Says she feels like she is breathing better but does have the BiPAP machine on. No complaints of pain. No nausea or vomiting.  Objective:    Vitals:   11/22/16 0515 11/22/16 0532 11/22/16 0600 11/22/16 0700  BP: (!) 149/63  (!) 152/73   Pulse: 71 67 67 67  Resp:  (!) 30 12 17   Temp:   97.2 F (36.2 C)   TempSrc:   Axillary   SpO2: (!) 88% 97% 100% 100%  Weight:   64.2 kg (141 lb 8.6 oz)   Height:   5\' 4"  (1.626 m)     Intake/Output Summary (Last 24 hours) at 11/22/16 0721 Last data filed at 11/22/16 0600  Gross per 24 hour  Intake            943.2 ml  Output              599 ml  Net            344.2 ml   Filed Weights   11/21/16 0400 11/22/16 0443 11/22/16 0600  Weight: 67.7 kg (149 lb 4 oz) 68.4 kg (150 lb 12.7 oz) 64.2 kg (141 lb 8.6 oz)    Exam: General exam: BiPAP on but awake and alert. Respiratory system: Diminished with BiPAP on. Cardiovascular system: S1 & S2 heard, RRR. No JVD,  rubs, gallops or clicks. No murmurs. Gastrointestinal system: Abdomen is nondistended, soft and nontender. No  organomegaly or masses felt. Normal bowel sounds heard. Central nervous system: Alert and oriented. No focal neurological deficits. Extremities: No clubbing,  or cyanosis. No edema. Skin: No rashes, lesions or ulcers. Psychiatry: Judgement and insight appear normal. Mood & affect appropriate.   Data Reviewed:   I have personally reviewed following labs and imaging studies:  Labs: Basic Metabolic Panel:  Recent Labs Lab 11/18/16 0441 11/19/16 0400 11/20/16 0532 11/20/16 1200 11/20/16 1645 11/21/16 0410 11/22/16 0340  NA  --  144 143 145 142 141 140  K  --  3.4* 3.2* 3.5 4.0 3.0* 4.6  CL  --  101 95* 97* 97* 99* 102  CO2  --  37* 41* 37*  37* 38* 34*  GLUCOSE  --  124* 99 167* 136* 150* 97  BUN  --  32* 31* 31* 32* 27* 22*  CREATININE  --  0.64 0.67 0.86 0.67 0.59 0.61  CALCIUM  --  8.1* 8.1* 7.9* 8.1* 8.0* 8.0*  MG 1.9 2.3 2.0  --   --  1.9 2.0  PHOS 3.2 3.7 4.0  --   --  3.6 2.8   GFR Estimated Creatinine Clearance: 58.9 mL/min (by C-G formula based on SCr of 0.61 mg/dL). Liver Function Tests:  Recent Labs Lab 11/17/16 0435  AST 18  ALT 20  ALKPHOS 49  BILITOT 0.3  PROT 4.4*  ALBUMIN 1.5*   No results for input(s): LIPASE, AMYLASE in the last 168 hours. No results for input(s): AMMONIA in the last 168 hours. Coagulation profile No results for input(s): INR, PROTIME in the last 168 hours.  CBC:  Recent Labs Lab 11/18/16 0441 11/19/16 0400 11/20/16 0532 11/21/16 0410 11/22/16 0340  WBC 10.0 7.5 6.9 5.9 6.5  NEUTROABS 7.9* 5.7 4.8 3.9  --   HGB 8.1* 7.6* 7.7* 8.0* 8.3*  HCT 26.1* 24.1* 24.8* 26.2* 27.4*  MCV 88.8 89.3 88.6 89.4 90.7  PLT 420* 406* 482* 541* 545*   Cardiac Enzymes: No results for input(s): CKTOTAL, CKMB, CKMBINDEX, TROPONINI in the last 168 hours. BNP (last 3 results) No results for input(s): PROBNP in the last 8760 hours. CBG:  Recent Labs Lab 11/21/16 0746 11/21/16 1127 11/21/16 1623 11/21/16 2122 11/22/16 0459  GLUCAP 81  74 226* 169* 115*   D-Dimer: No results for input(s): DDIMER in the last 72 hours. Hgb A1c: No results for input(s): HGBA1C in the last 72 hours. Lipid Profile: No results for input(s): CHOL, HDL, LDLCALC, TRIG, CHOLHDL, LDLDIRECT in the last 72 hours. Thyroid function studies: No results for input(s): TSH, T4TOTAL, T3FREE, THYROIDAB in the last 72 hours.  Invalid input(s): FREET3 Anemia work up: No results for input(s): VITAMINB12, FOLATE, FERRITIN, TIBC, IRON, RETICCTPCT in the last 72 hours. Sepsis Labs:  Recent Labs Lab 11/19/16 0400 11/20/16 0532 11/21/16 0410 11/22/16 0340  WBC 7.5 6.9 5.9 6.5    Microbiology Recent Results (from the past 240 hour(s))  Culture, respiratory (NON-Expectorated)     Status: None   Collection Time: 11/15/16 12:32 PM  Result Value Ref Range Status   Specimen Description BRONCHIAL ALVEOLAR LAVAGE  Final   Special Requests NONE  Final   Gram Stain   Final    MODERATE WBC PRESENT, PREDOMINANTLY PMN RARE SQUAMOUS EPITHELIAL CELLS PRESENT RARE GRAM POSITIVE COCCI IN PAIRS RARE GRAM NEGATIVE COCCOBACILLI    Culture Consistent with normal respiratory flora.  Final   Report Status 11/17/2016 FINAL  Final  Aerobic/Anaerobic Culture (surgical/deep wound)     Status: None   Collection Time: 11/15/16  1:18 PM  Result Value Ref Range Status   Specimen Description TISSUE RIGHT PLEURAL  Final   Special Requests   Final    RIGHT PLEURAL PEEL PATIENT ON FOLLOWING  VANCOMYCIN   Gram Stain   Final    ABUNDANT WBC PRESENT, PREDOMINANTLY PMN NO ORGANISMS SEEN    Culture No growth aerobically or anaerobically.  Final   Report Status 11/20/2016 FINAL  Final  Culture, body fluid-bottle     Status: None   Collection Time: 11/15/16  1:47 PM  Result Value Ref Range Status   Specimen Description PLEURAL RIGHT  Final   Special Requests POF VANC  Final   Culture NO GROWTH  5 DAYS  Final   Report Status 11/20/2016 FINAL  Final  Gram stain     Status: None     Collection Time: 11/15/16  1:47 PM  Result Value Ref Range Status   Specimen Description PLEURAL RIGHT  Final   Special Requests NONE  Final   Gram Stain   Final    ABUNDANT WBC PRESENT, PREDOMINANTLY PMN NO ORGANISMS SEEN    Report Status 11/15/2016 FINAL  Final  Aerobic/Anaerobic Culture (surgical/deep wound)     Status: None   Collection Time: 11/15/16  2:06 PM  Result Value Ref Range Status   Specimen Description ABSCESS RIGHT LOWER LUNG  Final   Special Requests PT ON VANC  Final   Gram Stain   Final    ABUNDANT WBC PRESENT,BOTH PMN AND MONONUCLEAR NO ORGANISMS SEEN    Culture No growth aerobically or anaerobically.  Final   Report Status 11/20/2016 FINAL  Final    Radiology: Dg Chest Port 1 View  Result Date: 11/22/2016 CLINICAL DATA:  66 y/o  F; increasing shortness of breath. EXAM: PORTABLE CHEST 1 VIEW COMPARISON:  11/21/2016 chest radiograph. FINDINGS: Stable cardiac silhouette given projection and technique. Aortic atherosclerosis with calcification. Stable position of right-sided chest tube. Increase interstitial opacities in the lungs bilaterally probably represents developing interstitial pulmonary edema. Increasing opacity of the right lung base probably represents worsening pleural effusion and atelectasis. No acute osseous abnormality identified. IMPRESSION: Increase interstitial opacities probably representing developing edema. Increasing opacity of the right lung and right lung base, likely increasing pleural effusion. No appreciable pneumothorax. Electronically Signed   By: Kristine Garbe M.D.   On: 11/22/2016 05:29   Dg Chest Port 1 View  Result Date: 11/21/2016 CLINICAL DATA:  History of pneumothorax.  Chest tube removal. EXAM: PORTABLE CHEST 1 VIEW COMPARISON:  11/20/2016. FINDINGS: Interim extubation. Interim removal of the lateral most chest tube. Two medial most chest tube is in stable position. Left IJ line in stable position . Heart size stable.  Right base subsegmental atelectasis. Perihilar atelectasis/infiltrate. Small bilateral pleural effusions. No pneumothorax. IMPRESSION: 1. Interim extubation. Interim removal of lateral most chest tube. Two medial most chest tubes in stable position. Left IJ line stable position. No pneumothorax. 2. Persistent right base subsegmental atelectasis. Mild left perihilar atelectasis/ infiltrate noted on today's exam. Small bilateral pleural effusions. Electronically Signed   By: Marcello Moores  Register   On: 11/21/2016 06:45    Medications:   . ampicillin-sulbactam (UNASYN) IV  3 g Intravenous Q8H  . atorvastatin  20 mg Oral Daily  . chlorhexidine  15 mL Mouth Rinse BID  . Chlorhexidine Gluconate Cloth  6 each Topical Q0600  . enoxaparin (LOVENOX) injection  40 mg Subcutaneous Q24H  . insulin aspart  0-9 Units Subcutaneous TID WC  . ipratropium-albuterol  3 mL Inhalation QID  . mouth rinse  15 mL Mouth Rinse q12n4p  . metoprolol tartrate  100 mg Oral BID  . pantoprazole  40 mg Oral Daily  . predniSONE  30 mg Oral Q breakfast   Followed by  . [START ON 11/24/2016] predniSONE  20 mg Oral Q breakfast   Followed by  . [START ON 11/27/2016] predniSONE  10 mg Oral Q breakfast   Followed by  . [START ON 11/30/2016] predniSONE  5 mg Oral Q breakfast   Continuous Infusions:  Medical decision making is of high complexity and this patient is at high risk of deterioration, therefore this is a level 3 visit.  (> 4 problem  points, >4 data points, high risk:)    LOS: 17 days   ,  [facility] Pager [phone]. If unable to reach me by pager, please call my cell phone at [phone].  *Please refer to [email or web address], password [email or web address] to get updated schedule on who will round on this patient, as hospitalists switch teams weekly. If 7PM-7AM, please contact night-coverage at [email or web address], password [email or web address] for any overnight needs.  11/22/2016, 7:21 AM

## 2016-11-22 NOTE — Progress Notes (Signed)
[facility] Progress Note Patient Name: Diana Gardner DOB: 24-Dec-1950 MRN: [id number]   Date of Service  11/22/2016  HPI/Events of Note  Informed by nursing patient was hypoxic and placed on 8L nasal canula. ABG 7.32/63/54 on 8L. CxR shows some pulm edema  eICU Interventions  Will try BIPAP and low dose lasix. Pt on antibiotics/BD and steroids.     Intervention Category Major Interventions: Hypoxemia - evaluation and management  Dimas Chyle 11/22/2016, 5:26 AM

## 2016-11-22 NOTE — Progress Notes (Signed)
RT called to place patient on BIPAP. Patient in respiratory distress. Patient tolerating BIPAP well at this time. Currently waiting to transport patient to [facility].

## 2016-11-22 NOTE — Progress Notes (Signed)
Patient transported from 2w17 to 1M38 with no complications. Report given to ICU RT.

## 2016-11-22 NOTE — Progress Notes (Signed)
PT Cancellation Note  Patient Details Name: Diana Gardner MRN: [id number] DOB: December 11, 1950   Cancelled Treatment:    Reason Eval/Treat Not Completed: Medical issues which prohibited therapy. Pt remains on bipap.   [place] 11/22/2016, 1:10 PM [place]

## 2016-11-22 NOTE — Progress Notes (Signed)
PT Cancellation Note  Patient Details Name: Diana Gardner MRN: [id number] DOB: 04/18/1951   Cancelled Treatment:    Reason Eval/Treat Not Completed: Medical issues which prohibited therapy. Pt on bipap. Will check back as able later.   Shary Decamp [place] 11/22/2016, 9:43 AM [place]

## 2016-11-22 NOTE — Progress Notes (Signed)
[facility] DAILY ICU PROGRESS NOTE                   [address]            [place], [address]          [phone]   7 Days Post-Op Procedure(s) (LRB): VIDEO BRONCHOSCOPY (N/A) VIDEO ASSISTED THORACOSCOPY (VATS), MINI THORACOTOMY, DRAINAGE OF EMPYEMA, DECORTICATION, RIGHT LOWER LOBE RESECTION (Right)  Total Length of Stay:  LOS: 17 days   Subjective: Events of this am noted. When asked if breathing improved with BiPap she nodded yes.   Objective: Vital signs in last 24 hours: Temp:  [97.2 F (36.2 C)-98 F (36.7 C)] 98 F (36.7 C) (04/04 0757) Pulse Rate:  [56-73] 61 (04/04 0800) Cardiac Rhythm: Normal sinus rhythm (04/04 0730) Resp:  [12-30] 13 (04/04 0800) BP: (109-198)/(45-73) 124/56 (04/04 0800) SpO2:  [88 %-100 %] 100 % (04/04 0800) FiO2 (%):  [60 %] 60 % (04/04 0800) Weight:  [64.2 kg (141 lb 8.6 oz)-68.4 kg (150 lb 12.7 oz)] 64.2 kg (141 lb 8.6 oz) (04/04 0600)  Filed Weights   11/21/16 0400 11/22/16 0443 11/22/16 0600  Weight: 67.7 kg (149 lb 4 oz) 68.4 kg (150 lb 12.7 oz) 64.2 kg (141 lb 8.6 oz)    Weight change: 0.7 kg (1 lb 8.7 oz)   Hemodynamic parameters for last 24 hours:    Intake/Output from previous day: 04/03 0701 - 04/04 0700 In: 943.2 [P.O.:240; IV Piggyback:703.2] Out: 599 [Urine:477; Stool:2; Chest Tube:120]  Intake/Output this shift: Total I/O In: -  Out: 475 [Urine:475]  Current Meds: Scheduled Meds: . ampicillin-sulbactam (UNASYN) IV  3 g Intravenous Q8H  . atorvastatin  20 mg Oral Daily  . chlorhexidine  15 mL Mouth Rinse BID  . Chlorhexidine Gluconate Cloth  6 each Topical Q0600  . enoxaparin (LOVENOX) injection  40 mg Subcutaneous Q24H  . insulin aspart  0-9 Units Subcutaneous TID WC  . ipratropium-albuterol  3 mL Inhalation QID  . mouth rinse  15 mL Mouth Rinse q12n4p  . metoprolol tartrate  100 mg Oral BID  . pantoprazole  40 mg Oral Daily  . predniSONE  30 mg Oral Q breakfast   Followed by  . [START ON  11/24/2016] predniSONE  20 mg Oral Q breakfast   Followed by  . [START ON 11/27/2016] predniSONE  10 mg Oral Q breakfast   Followed by  . [START ON 11/30/2016] predniSONE  5 mg Oral Q breakfast   Continuous Infusions:  PRN Meds:.albuterol, fentaNYL (SUBLIMAZE) injection, labetalol, ondansetron (ZOFRAN) IV, potassium chloride (KCL MULTIRUN) 30 mEq in 265 mL IVPB  CV-RRR Pulmonary-Coarse breath sounds Extremities-SCDs in place  Lab Results: CBC:  Recent Labs  11/21/16 0410 11/22/16 0340  WBC 5.9 6.5  HGB 8.0* 8.3*  HCT 26.2* 27.4*  PLT 541* 545*   BMET:   Recent Labs  11/21/16 0410 11/22/16 0340  NA 141 140  K 3.0* 4.6  CL 99* 102  CO2 38* 34*  GLUCOSE 150* 97  BUN 27* 22*  CREATININE 0.59 0.61  CALCIUM 8.0* 8.0*    CMET: Lab Results  Component Value Date   WBC 6.5 11/22/2016   HGB 8.3 (L) 11/22/2016   HCT 27.4 (L) 11/22/2016   PLT 545 (H) 11/22/2016   GLUCOSE 97 11/22/2016   ALT 20 11/17/2016   AST 18 11/17/2016   NA 140 11/22/2016   K 4.6 11/22/2016   CL 102 11/22/2016   CREATININE  0.61 11/22/2016   BUN 22 (H) 11/22/2016   CO2 34 (H) 11/22/2016   INR 1.26 11/14/2016      PT/INR: No results for input(s): LABPROT, INR in the last 72 hours. Radiology: Dg Chest Port 1 View  Result Date: 11/22/2016 CLINICAL DATA:  66 y/o  F; increasing shortness of breath. EXAM: PORTABLE CHEST 1 VIEW COMPARISON:  11/21/2016 chest radiograph. FINDINGS: Stable cardiac silhouette given projection and technique. Aortic atherosclerosis with calcification. Stable position of right-sided chest tube. Increase interstitial opacities in the lungs bilaterally probably represents developing interstitial pulmonary edema. Increasing opacity of the right lung base probably represents worsening pleural effusion and atelectasis. No acute osseous abnormality identified. IMPRESSION: Increase interstitial opacities probably representing developing edema. Increasing opacity of the right lung and  right lung base, likely increasing pleural effusion. No appreciable pneumothorax. Electronically Signed   By: Kristine Garbe M.D.   On: 11/22/2016 05:29     Assessment/Plan: S/P Procedure(s) (LRB): VIDEO BRONCHOSCOPY (N/A) VIDEO ASSISTED THORACOSCOPY (VATS), MINI THORACOTOMY, DRAINAGE OF EMPYEMA, DECORTICATION, RIGHT LOWER LOBE RESECTION (Right)  1. CV-SR in the 70's. On Lopressor 100 mg bid via tube. 2. Pulmonary-Chest tube with 120 cc of output. Chest tube is to water seal and there is no air leak. CXR this am shows no pneumothorax, increase in edema and right pleural effusion. I ordered 40 mg IV Lasix. On Prednisone as well. 3. Anemia-H and H remains  8.3 and 27.8 4. ID-on Unasyn for PNA. Cultures negative thus far 5. Management per pulmonary/CCM  ZIMMERMAN,DONIELLE M PA-C 11/22/2016 8:10 AM

## 2016-11-22 NOTE — Significant Event (Addendum)
Rapid Response Event Note Called by Farley Ly, RN for pt with sudden onset SOB and desats 70% on 2l [place]  Overview: Time Called: 0452 Arrival Time: 0455 Event Type: Respiratory  Initial Focused Assessment: upon my arrival to room pt sitting up in bed diaphoretic and with increased WOB and use of abdominal muscles. RR 26 No air movement in right upper and lower lobes, crackles in left base. C/O pain on inspiration in right upper chest. 198/67  78 NSR  97.5 oral temp.  CBG 115 . Oriented  X 4  Follows commands   Interventions: Albuterol 2.5 mg neb, Placed on VMask at 40%/ 8 l  with O2 sats 88-90%  Labetolol 10 mg IV with follow up BP 149/63  ABG : 7.32 PCO2 63.2  PO2 53.7 HCO3 32.1 Stat PCXR: Increased interstitial opacities in bilateral lungs probably represents developing interstitial pulmonary edema.  Increasing opacity of right lung base probably represents worsening pleural  Effusion and atelectasis.  Dr Lyndel Safe, CCM notified of event and ordered BIPAP and transfer to SDU.  Placed on 60% BIPAP by Broadus John, RT  With sats 98%  and transferred to [facility] at [place].Marland Kitchen  [place],RN to update daughter on need to transfer and POC  Plan of Care (if not transferred):  Event Summary: Name of Physician Notified: Lyndel Safe at [phone]    at    Outcome: Transferred (Comment) (SDU)     Leo Grosser, Gust Brooms

## 2016-11-22 NOTE — Progress Notes (Signed)
7 Days Post-Op Procedure(s) (LRB): VIDEO BRONCHOSCOPY (N/A) VIDEO ASSISTED THORACOSCOPY (VATS), MINI THORACOTOMY, DRAINAGE OF EMPYEMA, DECORTICATION, RIGHT LOWER LOBE RESECTION (Right) Subjective: Mild incisional pain currently, was hurting more earlier this AM  Objective: Vital signs in last 24 hours: Temp:  [97.2 F (36.2 C)-98 F (36.7 C)] 98 F (36.7 C) (04/04 0757) Pulse Rate:  [56-73] 61 (04/04 0800) Cardiac Rhythm: Normal sinus rhythm (04/04 0730) Resp:  [12-30] 13 (04/04 0800) BP: (109-198)/(45-73) 124/56 (04/04 0800) SpO2:  [88 %-100 %] 100 % (04/04 0800) FiO2 (%):  [60 %] 60 % (04/04 0800) Weight:  [141 lb 8.6 oz (64.2 kg)-150 lb 12.7 oz (68.4 kg)] 141 lb 8.6 oz (64.2 kg) (04/04 0600)  Hemodynamic parameters for last 24 hours:    Intake/Output from previous day: 04/03 0701 - 04/04 0700 In: 943.2 [P.O.:240; IV Piggyback:703.2] Out: 599 [Urine:477; [place]; Chest Tube:120] Intake/Output this shift: Total I/O In: -  Out: 475 [Urine:475]  General appearance: alert, cooperative and no distress Neurologic: intact Heart: regular rate and rhythm Lungs: diminished breath sounds on right  no air leak  Lab Results:  Recent Labs  11/21/16 0410 11/22/16 0340  WBC 5.9 6.5  HGB 8.0* 8.3*  HCT 26.2* 27.4*  PLT 541* 545*   BMET:  Recent Labs  11/21/16 0410 11/22/16 0340  NA 141 140  K 3.0* 4.6  CL 99* 102  CO2 38* 34*  GLUCOSE 150* 97  BUN 27* 22*  CREATININE 0.59 0.61  CALCIUM 8.0* 8.0*    PT/INR: No results for input(s): LABPROT, INR in the last 72 hours. ABG    Component Value Date/Time   PHART 7.334 (L) 11/22/2016 0506   HCO3 32.1 (H) 11/22/2016 0506   TCO2 29 11/16/2016 1139   ACIDBASEDEF 12.0 (H) 11/07/2016 2005   O2SAT 82.1 11/22/2016 0506   CBG (last 3)   Recent Labs  11/21/16 2122 11/22/16 0459 11/22/16 0755  GLUCAP 169* 115* 112*    Assessment/Plan: S/P Procedure(s) (LRB): VIDEO BRONCHOSCOPY (N/A) VIDEO ASSISTED THORACOSCOPY  (VATS), MINI THORACOTOMY, DRAINAGE OF EMPYEMA, DECORTICATION, RIGHT LOWER LOBE RESECTION (Right) -  Had to be placed in BIPAP this AM due to hypercarbic acute on chronic respiratory failure CXr showed more atelectasis at right lung base and some interstitial edema  Has congested cough- will add mucinex as well Now back on [place] and breathing comfortably For some reason does not have an IS- will order IS and flutter valve Mobilize as tolerated SCD + enoxaparin for DVT prophylaxis   LOS: 17 days    [place] 11/22/2016

## 2016-11-23 ENCOUNTER — Inpatient Hospital Stay (HOSPITAL_COMMUNITY): Payer: Medicare Other

## 2016-11-23 DIAGNOSIS — T380X5A Adverse effect of glucocorticoids and synthetic analogues, initial encounter: Secondary | ICD-10-CM | POA: Diagnosis not present

## 2016-11-23 DIAGNOSIS — R739 Hyperglycemia, unspecified: Secondary | ICD-10-CM | POA: Diagnosis not present

## 2016-11-23 LAB — GLUCOSE, CAPILLARY
GLUCOSE-CAPILLARY: 182 mg/dL — AB (ref 65–99)
GLUCOSE-CAPILLARY: 134 mg/dL — AB (ref 65–99)
Glucose-Capillary: 124 mg/dL — ABNORMAL HIGH (ref 65–99)
Glucose-Capillary: 179 mg/dL — ABNORMAL HIGH (ref 65–99)

## 2016-11-23 LAB — BASIC METABOLIC PANEL
Anion gap: 6 (ref 5–15)
BUN: 23 mg/dL — ABNORMAL HIGH (ref 6–20)
CO2: 34 mmol/L — ABNORMAL HIGH (ref 22–32)
Calcium: 7.8 mg/dL — ABNORMAL LOW (ref 8.9–10.3)
Chloride: 97 mmol/L — ABNORMAL LOW (ref 101–111)
Creatinine, Ser: 0.73 mg/dL (ref 0.44–1.00)
GFR calc Af Amer: >60 mL/min (ref >60)
GFR calc non Af Amer: >60 mL/min (ref >60)
Glucose, Bld: 162 mg/dL — ABNORMAL HIGH (ref 65–99)
Potassium: 4.1 mmol/L (ref 3.5–5.1)
Sodium: 137 mmol/L (ref 135–145)

## 2016-11-23 MED ORDER — FUROSEMIDE 10 MG/ML IJ SOLN
40.0000 mg | Freq: Once | INTRAMUSCULAR | Status: AC
  Administered 2016-11-23: 40 mg via INTRAVENOUS
  Filled 2016-11-23: qty 4

## 2016-11-23 NOTE — Progress Notes (Signed)
Progress Note    Diana Gardner  YHC:[id number] DOB: 1951/04/10  DOA: 11/05/2016 PCP: [name], MD    Brief Narrative:   Chief complaint: Pneumonia, COPD, pleural effusion.  Medical records reviewed and are as summarized below:  Diana Gardner is an 66 y.o. female with a PMH of COPD, chronic respiratory failure on home oxygen who was admitted 11/05/16 for a right lower lobe necrotizing community-acquired pneumonia complicated by pneumothorax and empyema status post right facets and right lower lobectomy on 11/15/16. She was intubated from 11/07/16 arrow forward 11/20/16 and under the care of the critical care team. She also required pressor support, which was weaned 11/09/16. Care transferred to [facility] 11/22/16.  Assessment/Plan:   Principal Problem:   Sepsis (HCC)/Septic shock Admitted and placed on empiric Rocephin/azithromycin. Antibiotics broadened to vancomycin and Unasyn on 11/08/16. Sputum cultures on admission were negative. Blood cultures were negative. Respiratory virus panel was negative. Urinary strep antigen was positive. Pressors were weaned 11/09/16. Remains on Unasyn. Critical care team recommended a 2-4 week course of Unasyn.The patient continues to improve and is now off BiPAP.  Active Problems:   Chronic obstructive pulmonary disease (HCC) with acute exacerbation Initially placed on IV steroids which have been weaned to oral prednisone with taper. Continue bronchodilators. Scattered rhonchi remain on lung exam, but breathing status has improved and she is now off BiPAP.    Acute on chronic hypoxemic respiratory failure (HCC) complicated by primary spontaneous right pneumothorax requiring chest tube placement, status post right lower lobectomy Intubated on admission. Chest tube placed 11/09/16 secondary to right pneumothorax. Underwent a right VATS and right mini thoracotomy with drainage of empyema, decortication and right lower lobectomy on 11/15/16. Extubated 11/20/16  to nasal cannula. Anterior chest tube discontinued 11/20/16. Continue nebs. Respiratory status slowly improving. Chest x-ray personally reviewed and shows bilateral lower lobe opacity is with some clearing of interstitium, no pneumothorax status post chest tube removal.    Acute renal failure (ARF) (HCC) Secondary to hypoperfusion in the setting of NSAID and ACE inhibitor use.    Hyponatremia  Resolved.    Microcytic anemia Hemoglobin stable at 8.3 this morning.    Hypokalemia/Hyperkalemia Potassium currently within normal limits.    Hyperlipidemia Continue Lipitor.    Hypertension Continue metoprolol and when necessary labetalol.    Steroid induced hyperglycemia CBGs 112-169. Continue insulin sensitive SSI.    Family Communication/Anticipated D/C date and plan/Code Status   DVT prophylaxis: Lovenox ordered. Code Status: Full Code.  Family Communication: Daughter at the bedside. Disposition Plan: Home versus SNF depending on progress.   Medical Consultants:    Pulmonology  CVTS    Procedures:   2-D echo 11/08/16  Study Conclusions  - Left ventricle: The cavity size was normal. Systolic function was   vigorous. The estimated ejection fraction was in the range of 65%   to 70%. Wall motion was normal; there were no regional wall   motion abnormalities. Doppler parameters are consistent with   abnormal left ventricular relaxation (grade 1 diastolic   dysfunction). LV mid cavity gradient, peak 22 mmHg. - Aortic valve: There was no stenosis. - Mitral valve: There was no significant regurgitation. - Right ventricle: The cavity size was normal. Systolic function   was normal. - Pulmonary arteries: No complete TR doppler jet so unable to   estimate PA systolic pressure. - Inferior vena cava: The vessel was normal in size. The   respirophasic diameter changes were in the normal range (>= 50%),  consistent with normal central venous pressure.  11/15/16 PROCEDURE:    BRONCHOSCOPY RIGHT VATS/ MINI THORRACOTOMY DRAINAGE OF EMPYEMA AND DECORTICATION RIGHT LOWER LOBECTOMY  Anti-Infectives:   Anti-infectives    Start     Dose/Rate Route Frequency Ordered Stop   11/16/16 0000  vancomycin (VANCOCIN) IVPB 1000 mg/200 mL premix     1,000 mg 200 mL/hr over 60 Minutes Intravenous Every 12 hours 11/15/16 1711 11/16/16 0100   11/15/16 0715  vancomycin (VANCOCIN) IVPB 1000 mg/200 mL premix     1,000 mg 200 mL/hr over 60 Minutes Intravenous On call to O.R. 11/15/16 0706 11/15/16 1315   11/10/16 0142  Ampicillin-Sulbactam (UNASYN) 3 g in sodium chloride 0.9 % 100 mL IVPB     3 g 200 mL/hr over 30 Minutes Intravenous Every 8 hours 11/09/16 1809     11/08/16 1700  Ampicillin-Sulbactam (UNASYN) 3 g in sodium chloride 0.9 % 100 mL IVPB  Status:  Discontinued     3 g 200 mL/hr over 30 Minutes Intravenous Every 12 hours 11/08/16 1632 11/09/16 1809   11/07/16 2100  vancomycin (VANCOCIN) IVPB 1000 mg/200 mL premix  Status:  Discontinued     1,000 mg 200 mL/hr over 60 Minutes Intravenous Every 48 hours 11/05/16 2144 11/06/16 0347   11/06/16 2100  cefTRIAXone (ROCEPHIN) 1 g in dextrose 5 % 50 mL IVPB  Status:  Discontinued     1 g 100 mL/hr over 30 Minutes Intravenous Every 24 hours 11/05/16 2326 11/08/16 1609   11/06/16 2100  azithromycin (ZITHROMAX) tablet 500 mg  Status:  Discontinued     500 mg Oral Every 24 hours 11/05/16 2326 11/07/16 1452   11/05/16 2200  azithromycin (ZITHROMAX) 500 mg in dextrose 5 % 250 mL IVPB     500 mg 250 mL/hr over 60 Minutes Intravenous  Once 11/05/16 2154 11/05/16 2315   11/05/16 1945  vancomycin (VANCOCIN) IVPB 1000 mg/200 mL premix     1,000 mg 200 mL/hr over 60 Minutes Intravenous  Once 11/05/16 1944 11/05/16 2209   11/05/16 1930  cefTRIAXone (ROCEPHIN) 1 g in dextrose 5 % 50 mL IVPB     1 g 100 mL/hr over 30 Minutes Intravenous  Once 11/05/16 1926 11/05/16 2023   11/05/16 1930  azithromycin (ZITHROMAX) 500 mg in dextrose 5 %  250 mL IVPB  Status:  Discontinued     500 mg 250 mL/hr over 60 Minutes Intravenous  Once 11/05/16 1926 11/05/16 2014      Subjective:   Less dyspneic, but still has a moist but mainly nonproductive cough. No complaints of current pain. Appetite fair.  Objective:    Vitals:   11/23/16 0300 11/23/16 0400 11/23/16 0500 11/23/16 0600  BP: (!) 132/57 (!) 116/53 (!) 135/54 125/64  Pulse: (!) 58 (!) 53 (!) 56 (!) 58  Resp: (!) 21 18 19  (!) 21  Temp: 97.9 F (36.6 C)     TempSrc: Oral     SpO2: 100% 99% 100% 100%  Weight:      Height:        Intake/Output Summary (Last 24 hours) at 11/23/16 0713 Last data filed at 11/23/16 0500  Gross per 24 hour  Intake             1120 ml  Output             2035 ml  Net             -915 ml   Filed Weights   11/21/16  0400 11/22/16 0443 11/22/16 0600  Weight: 67.7 kg (149 lb 4 oz) 68.4 kg (150 lb 12.7 oz) 64.2 kg (141 lb 8.6 oz)    Exam: General exam: Awake and alert. Respiratory system: Scattered bilateral rhonchi. Cardiovascular system: S1 & S2 heard, RRR. No JVD,  rubs, gallops or clicks. No murmurs. Gastrointestinal system: Abdomen is nondistended, soft and nontender. No organomegaly or masses felt. Normal bowel sounds heard. Central nervous system: Alert and oriented. No focal neurological deficits. Extremities: No clubbing,  or cyanosis. No edema. Skin: Foam dressing to sacrum, right surgical site without signs of infection. Psychiatry: Judgement and insight appear normal. Mood & affect appropriate.   Data Reviewed:   I have personally reviewed following labs and imaging studies:  Labs: Basic Metabolic Panel:  Recent Labs Lab 11/18/16 0441 11/19/16 0400 11/20/16 0532 11/20/16 1200 11/20/16 1645 11/21/16 0410 11/22/16 0340 11/23/16 0249  NA  --  144 143 145 142 141 140 137  K  --  3.4* 3.2* 3.5 4.0 3.0* 4.6 4.1  CL  --  101 95* 97* 97* 99* 102 97*  CO2  --  37* 41* 37* 37* 38* 34* 34*  GLUCOSE  --  124* 99 167* 136*  150* 97 162*  BUN  --  32* 31* 31* 32* 27* 22* 23*  CREATININE  --  0.64 0.67 0.86 0.67 0.59 0.61 0.73  CALCIUM  --  8.1* 8.1* 7.9* 8.1* 8.0* 8.0* 7.8*  MG 1.9 2.3 2.0  --   --  1.9 2.0  --   PHOS 3.2 3.7 4.0  --   --  3.6 2.8  --    GFR Estimated Creatinine Clearance: 58.9 mL/min (by C-G formula based on SCr of 0.73 mg/dL). Liver Function Tests:  Recent Labs Lab 11/17/16 0435  AST 18  ALT 20  ALKPHOS 49  BILITOT 0.3  PROT 4.4*  ALBUMIN 1.5*   No results for input(s): LIPASE, AMYLASE in the last 168 hours. No results for input(s): AMMONIA in the last 168 hours. Coagulation profile No results for input(s): INR, PROTIME in the last 168 hours.  CBC:  Recent Labs Lab 11/18/16 0441 11/19/16 0400 11/20/16 0532 11/21/16 0410 11/22/16 0340  WBC 10.0 7.5 6.9 5.9 6.5  NEUTROABS 7.9* 5.7 4.8 3.9  --   HGB 8.1* 7.6* 7.7* 8.0* 8.3*  HCT 26.1* 24.1* 24.8* 26.2* 27.4*  MCV 88.8 89.3 88.6 89.4 90.7  PLT 420* 406* 482* 541* 545*   Cardiac Enzymes: No results for input(s): CKTOTAL, CKMB, CKMBINDEX, TROPONINI in the last 168 hours. BNP (last 3 results) No results for input(s): PROBNP in the last 8760 hours. CBG:  Recent Labs Lab 11/22/16 0459 11/22/16 0755 11/22/16 1157 11/22/16 1656 11/22/16 2329  GLUCAP 115* 112* 113* 113* 169*   D-Dimer: No results for input(s): DDIMER in the last 72 hours. Hgb A1c: No results for input(s): HGBA1C in the last 72 hours. Lipid Profile: No results for input(s): CHOL, HDL, LDLCALC, TRIG, CHOLHDL, LDLDIRECT in the last 72 hours. Thyroid function studies: No results for input(s): TSH, T4TOTAL, T3FREE, THYROIDAB in the last 72 hours.  Invalid input(s): FREET3 Anemia work up: No results for input(s): VITAMINB12, FOLATE, FERRITIN, TIBC, IRON, RETICCTPCT in the last 72 hours. Sepsis Labs:  Microbiology Recent Results (from the past 240 hour(s))  Culture, respiratory (NON-Expectorated)     Status: None   Collection Time: 11/15/16 12:32  PM  Result Value Ref Range Status   Specimen Description BRONCHIAL ALVEOLAR LAVAGE  Final  Special Requests NONE  Final   Gram Stain   Final    MODERATE WBC PRESENT, PREDOMINANTLY PMN RARE SQUAMOUS EPITHELIAL CELLS PRESENT RARE GRAM POSITIVE COCCI IN PAIRS RARE GRAM NEGATIVE COCCOBACILLI    Culture Consistent with normal respiratory flora.  Final   Report Status 11/17/2016 FINAL  Final  Aerobic/Anaerobic Culture (surgical/deep wound)     Status: None   Collection Time: 11/15/16  1:18 PM  Result Value Ref Range Status   Specimen Description TISSUE RIGHT PLEURAL  Final   Special Requests   Final    RIGHT PLEURAL PEEL PATIENT ON FOLLOWING  VANCOMYCIN   Gram Stain   Final    ABUNDANT WBC PRESENT, PREDOMINANTLY PMN NO ORGANISMS SEEN    Culture No growth aerobically or anaerobically.  Final   Report Status 11/20/2016 FINAL  Final  Culture, body fluid-bottle     Status: None   Collection Time: 11/15/16  1:47 PM  Result Value Ref Range Status   Specimen Description PLEURAL RIGHT  Final   Special Requests POF VANC  Final   Culture NO GROWTH 5 DAYS  Final   Report Status 11/20/2016 FINAL  Final  Gram stain     Status: None   Collection Time: 11/15/16  1:47 PM  Result Value Ref Range Status   Specimen Description PLEURAL RIGHT  Final   Special Requests NONE  Final   Gram Stain   Final    ABUNDANT WBC PRESENT, PREDOMINANTLY PMN NO ORGANISMS SEEN    Report Status 11/15/2016 FINAL  Final  Aerobic/Anaerobic Culture (surgical/deep wound)     Status: None   Collection Time: 11/15/16  2:06 PM  Result Value Ref Range Status   Specimen Description ABSCESS RIGHT LOWER LUNG  Final   Special Requests PT ON VANC  Final   Gram Stain   Final    ABUNDANT WBC PRESENT,BOTH PMN AND MONONUCLEAR NO ORGANISMS SEEN    Culture No growth aerobically or anaerobically.  Final   Report Status 11/20/2016 FINAL  Final    Radiology: Dg Chest Port 1 View  Result Date: 11/22/2016 CLINICAL DATA:  66 y/o   F; increasing shortness of breath. EXAM: PORTABLE CHEST 1 VIEW COMPARISON:  11/21/2016 chest radiograph. FINDINGS: Stable cardiac silhouette given projection and technique. Aortic atherosclerosis with calcification. Stable position of right-sided chest tube. Increase interstitial opacities in the lungs bilaterally probably represents developing interstitial pulmonary edema. Increasing opacity of the right lung base probably represents worsening pleural effusion and atelectasis. No acute osseous abnormality identified. IMPRESSION: Increase interstitial opacities probably representing developing edema. Increasing opacity of the right lung and right lung base, likely increasing pleural effusion. No appreciable pneumothorax. Electronically Signed   By: Kristine Garbe M.D.   On: 11/22/2016 05:29    Medications:   . ampicillin-sulbactam (UNASYN) IV  3 g Intravenous Q8H  . atorvastatin  20 mg Oral Daily  . chlorhexidine  15 mL Mouth Rinse BID  . enoxaparin (LOVENOX) injection  40 mg Subcutaneous Q24H  . feeding supplement (ENSURE ENLIVE)  237 mL Oral BID BM  . guaiFENesin  1,200 mg Oral BID  . insulin aspart  0-9 Units Subcutaneous TID WC  . ipratropium-albuterol  3 mL Inhalation QID  . mouth rinse  15 mL Mouth Rinse q12n4p  . metoprolol tartrate  100 mg Oral BID  . pantoprazole  40 mg Oral Daily  . predniSONE  30 mg Oral Q breakfast   Followed by  . [START ON 11/24/2016] predniSONE  20 mg  Oral Q breakfast   Followed by  . [START ON 11/27/2016] predniSONE  10 mg Oral Q breakfast   Followed by  . [START ON 11/30/2016] predniSONE  5 mg Oral Q breakfast   Continuous Infusions:  Medical decision making is of high complexity and this patient is at high risk of deterioration, therefore this is a level 3 visit.  (> 4 problem points, >4 data points, high risk:)    LOS: 18 days   ,  [facility] Pager [phone]. If unable to reach me by pager, please call my cell phone at  [phone].  *Please refer to [email or web address], password [email or web address] to get updated schedule on who will round on this patient, as hospitalists switch teams weekly. If 7PM-7AM, please contact night-coverage at [email or web address], password [email or web address] for any overnight needs.  11/23/2016, 7:13 AM

## 2016-11-23 NOTE — Evaluation (Signed)
Physical Therapy Evaluation Patient Details Name: Diana Gardner MRN: [id number] DOB: 1951-01-30 Today's Date: 11/23/2016   History of Present Illness  Pt is a 66 y/o female admitted with sepsis, PNA, and empyema. Pt underwent Bronchoscopy, right video-assisted thoracoscopy, right minithoracotomy, drainage of empyema, decortication, right lower lobectomy on 3/28. Of note, pt was intubated from 3/20 to 3/28. PMH including but not limited to copd, htn  Clinical Impression  Pt presented supine in bed with HOB elevated, awake and willing to participate in therapy session. Prior to admission, pt reported that she was independent with all functional mobility. Pt currently requires mod A x2 for bed mobility, transfers and very short distance ambulation (see below for details).Pt on 2L of O2 with SPO2 at 98% at beginning. However, on RA pt desats to the low 80's with activity. PT reapplied 2L of O2 via [place] with good recovery to >90%. PT recommending that pt d/c to SNF for ST rehab prior to returning home. Pt would continue to benefit from skilled physical therapy services at this time while admitted and after d/c to address the below listed limitations in order to improve overall safety and independence with functional mobility.  Pt's RN requesting pt remain in bed at end of session in preparation for chest tube removal. Pt's nurse tech agreeable to assist pt OOB to recliner chair later today.     Follow Up Recommendations SNF    Equipment Recommendations  None recommended by PT;Other (comment) (defer to next venue)    Recommendations for Other Services       Precautions / Restrictions Precautions Precautions: Fall Precaution Comments: chest tube Restrictions Weight Bearing Restrictions: No      Mobility  Bed Mobility Overal bed mobility: Needs Assistance Bed Mobility: Supine to Sit;Sit to Supine     Supine to sit: Mod assist;+2 for physical assistance;HOB elevated Sit to supine: Mod  assist;+2 for physical assistance   General bed mobility comments: increased time, use of bed rails, assist with movement of LEs off of bed and to elevate trunk. Bed pads used to position pt's hips at EOB. Mod A x2 at bilateral LEs and trunk to return to supine from sitting EOB.  Transfers Overall transfer level: Needs assistance Equipment used: 2 person hand held assist Transfers: Sit to/from [facility] Sit to Stand: Mod assist;+2 physical assistance Stand pivot transfers: Mod assist;+2 physical assistance       General transfer comment: pt required increased time secondary to pain and bilateral LE weakness. pt also required VC'ing for bilateral hand placement and technique. Mod A x2 to rise from bed; max A x1 to rise from [facility]  Ambulation/Gait Ambulation/Gait assistance: Mod assist Ambulation Distance (Feet): 2 Feet Assistive device: 2 person hand held assist   Gait velocity: decreased Gait velocity interpretation: Below normal speed for age/gender General Gait Details: pt able to take side steps with mod A towards HOB after stand-pivot transfer from [place] Mobility    Modified Rankin (Stroke Patients Only)       Balance Overall balance assessment: Needs assistance Sitting-balance support: Feet supported Sitting balance-Leahy Scale: Fair     Standing balance support: During functional activity;Bilateral upper extremity supported Standing balance-Leahy Scale: Poor Standing balance comment: pt reliant on external surfaces for support, mod A  Pertinent Vitals/Pain Pain Assessment: Faces Faces Pain Scale: Hurts little more Pain Location: chest tube site Pain Descriptors / Indicators: Sore;Guarding Pain Intervention(s): Monitored during session;Repositioned    Home Living Family/patient expects to be discharged to:: Private residence Living Arrangements: Children Available Help at  Discharge: Family;Available PRN/intermittently   Home Access: Level entry     Home Layout: One level Home Equipment: None      Prior Function Level of Independence: Independent               Hand Dominance        Extremity/Trunk Assessment   Upper Extremity Assessment Upper Extremity Assessment: Generalized weakness    Lower Extremity Assessment Lower Extremity Assessment: Generalized weakness       Communication   Communication: No difficulties  Cognition Arousal/Alertness: Awake/alert Behavior During Therapy: WFL for tasks assessed/performed;Anxious Overall Cognitive Status: Impaired/Different from baseline Area of Impairment: Problem solving                             Problem Solving: Difficulty sequencing;Requires verbal cues;Requires tactile cues        General Comments      Exercises     Assessment/Plan    PT Assessment Patient needs continued PT services  PT Problem List Decreased strength;Decreased activity tolerance;Decreased balance;Decreased mobility;Decreased coordination;Decreased knowledge of use of DME;Decreased safety awareness;Decreased knowledge of precautions;Cardiopulmonary status limiting activity;Pain       PT Treatment Interventions DME instruction;Gait training;Functional mobility training;Therapeutic activities;Therapeutic exercise;Balance training;Neuromuscular re-education;Patient/family education    PT Goals (Current goals can be found in the Care Plan section)  Acute Rehab PT Goals Patient Stated Goal: decrease pain PT Goal Formulation: With patient Time For Goal Achievement: 12/07/16 Potential to Achieve Goals: Good    Frequency Min 3X/week   Barriers to discharge        Co-evaluation               End of Session Equipment Utilized During Treatment: Gait belt;Oxygen Activity Tolerance: Patient limited by pain;Patient limited by fatigue Patient left: in bed;with call bell/phone within  reach;with nursing/sitter in room (NT entering room) Nurse Communication: Mobility status PT Visit Diagnosis: Unsteadiness on feet (R26.81);Other abnormalities of gait and mobility (R26.89);Pain Pain - Right/Left: Right Pain - part of body:  (chest tube site)    Time: 2778-2423 PT Time Calculation (min) (ACUTE ONLY): 25 min   Charges:   PT Evaluation $PT Eval Moderate Complexity: 1 Procedure PT Treatments $Therapeutic Activity: 8-22 mins   PT G Codes:        [place], PT, DPT [place] 11/23/2016, 11:50 AM

## 2016-11-23 NOTE — Progress Notes (Signed)
8 Days Post-Op Procedure(s) (LRB): VIDEO BRONCHOSCOPY (N/A) VIDEO ASSISTED THORACOSCOPY (VATS), MINI THORACOTOMY, DRAINAGE OF EMPYEMA, DECORTICATION, RIGHT LOWER LOBE RESECTION (Right) Subjective: Eating breakfast, no complaints this AM  Objective: Vital signs in last 24 hours: Temp:  [97.5 F (36.4 C)-98.4 F (36.9 C)] 97.5 F (36.4 C) (04/05 0700) Pulse Rate:  [53-77] 58 (04/05 0900) Cardiac Rhythm: Normal sinus rhythm (04/05 0800) Resp:  [16-31] 22 (04/05 0900) BP: (101-157)/(45-64) 134/53 (04/05 0900) SpO2:  [89 %-100 %] 98 % (04/05 0900) FiO2 (%):  [40 %] 40 % (04/04 1233)  Hemodynamic parameters for last 24 hours:    Intake/Output from previous day: 04/04 0701 - 04/05 0700 In: 1120 [P.O.:820; IV Piggyback:300] Out: 2035 [Urine:1975; Chest Tube:60] Intake/Output this shift: Total I/O In: 60 [P.O.:60] Out: 20 [Chest Tube:20]  General appearance: alert, cooperative and no distress Neurologic: intact Heart: regular rate and rhythm Lungs: diminished breath sounds [place] no air leak  Lab Results:  Recent Labs  11/21/16 0410 11/22/16 0340  WBC 5.9 6.5  HGB 8.0* 8.3*  HCT 26.2* 27.4*  PLT 541* 545*   BMET:  Recent Labs  11/22/16 0340 11/23/16 0249  NA 140 137  K 4.6 4.1  CL 102 97*  CO2 34* 34*  GLUCOSE 97 162*  BUN 22* 23*  CREATININE 0.61 0.73  CALCIUM 8.0* 7.8*    PT/INR: No results for input(s): LABPROT, INR in the last 72 hours. ABG    Component Value Date/Time   PHART 7.334 (L) 11/22/2016 0506   HCO3 32.1 (H) 11/22/2016 0506   TCO2 29 11/16/2016 1139   ACIDBASEDEF 12.0 (H) 11/07/2016 2005   O2SAT 82.1 11/22/2016 0506   CBG (last 3)   Recent Labs  11/22/16 1656 11/22/16 2329 11/23/16 0749  GLUCAP 113* 169* 124*    Assessment/Plan: S/P Procedure(s) (LRB): VIDEO BRONCHOSCOPY (N/A) VIDEO ASSISTED THORACOSCOPY (VATS), MINI THORACOTOMY, DRAINAGE OF EMPYEMA, DECORTICATION, RIGHT LOWER LOBE RESECTION (Right) -  Minimal drainage  from CT and no air leak- dc chest tube Continue IS and flutter- atelectasis improving Mobilize- OOB to chair and ambulate   LOS: 18 days    [place] Nakayama 11/23/2016

## 2016-11-23 NOTE — Progress Notes (Signed)
PULMONARY  / CRITICAL CARE MEDICINE  Name: Diana Gardner MRN: [id number] DOB: [birth year]    LOS: 31  REFERRING MD :  [facility]  CHIEF COMPLAINT:  Acute on chronic hypercarbic respiratory failure  BRIEF PATIENT DESCRIPTION: Ms. Diana Gardner is a 66 year old woman with COPD on home oxygen hospitalized for RLL necrotizing CAP complicated by pneumothorax and empyema s/p VATS and lobectomy who developed acute on chronic hypercarbic respiratory failure.  LINES / TUBES:  3/20 Radial a-line >> 3/25 3/28 Chest tube   CULTURES: 3/20 Tracheal aspirate >> NGTD x 2 days 3/21 Respiratory viral panel negative 3/21 Blood cultures x 2 >> NGTD x 5 days 3/28 R pleural fluid NGTD x 5 days  ANTIBIOTICS: Azithromycin 3/18  Ceftriaxone 3/18 >> 3/21 Vanc 3/21 >> 3/21 Unasyn 3/21  SIGNIFICANT EVENTS:  3/18 Admitted 3/20 transfer to ICU and intubated 3/21 refractory shock 3/22 Right PTX > chest tube placed. Pressors stopped 3/28 VATS, right mini thoracotomy, drainage of empyema, decortication, right lower lobectomy. 4/02 Extubated 4/03 Re-admitted to ICU for acute hypercarbic respiratory failure. Improved on BiPAP.  DIET:  Heart Healthy/Carb Mod  DVT Px:  SCDs & enoxaparin  GI Px:  Pantoprazole  INTERVAL HISTORY:  Did not require BiPAP overnight More comfortable Breathing pattern Net negative greater than 2 L over the last 2-3 days  VITAL SIGNS: Temp:  [97.5 F (36.4 C)-98.4 F (36.9 C)] 97.5 F (36.4 C) (04/05 0700) Pulse Rate:  [53-77] 58 (04/05 0900) Resp:  [16-31] 22 (04/05 0900) BP: (101-157)/(45-64) 134/53 (04/05 0900) SpO2:  [89 %-100 %] 98 % (04/05 0900) FiO2 (%):  [40 %] 40 % (04/04 1233)   VENTILATOR SETTINGS: Vent Mode: BIPAP;PCV FiO2 (%):  [40 %] 40 % Set Rate:  [8 bmp] 8 bmp PEEP:  [7 cmH20] 7 cmH20  INTAKE / OUTPUT: Intake/Output      04/04 0701 - 04/05 0700 04/05 0701 - 04/06 0700   P.O. 820 60   IV Piggyback 300    Total Intake(mL/kg) 1120 (17.4) 60 (0.9)   Urine (mL/kg/hr) 1975 (1.3)    Stool 0 (0)    Chest Tube 60 (0) 20 (0.1)   Total Output 2035 20   Net -915 +40        Stool Occurrence 3 x      PHYSICAL EXAMINATION: .Physical Exam  Constitutional: She is oriented to person, place, and time. No distress.  HENT:  Head: Normocephalic and atraumatic.  Eyes: Conjunctivae are normal. No scleral icterus.  Cardiovascular: Normal rate.   Pulmonary/Chest: No respiratory distress. She has rales.  Bilateral basilar crackles  Abdominal: Soft. She exhibits no distension.  Neurological: She is alert and oriented to person, place, and time.  Skin: She is not diaphoretic.    LABS: Cbc  Recent Labs Lab 11/20/16 0532 11/21/16 0410 11/22/16 0340  WBC 6.9 5.9 6.5  HGB 7.7* 8.0* 8.3*  HCT 24.8* 26.2* 27.4*  PLT 482* 541* 545*    Chemistry   Recent Labs Lab 11/20/16 0532  11/21/16 0410 11/22/16 0340 11/23/16 0249  NA 143  < > 141 140 137  K 3.2*  < > 3.0* 4.6 4.1  CL 95*  < > 99* 102 97*  CO2 41*  < > 38* 34* 34*  BUN 31*  < > 27* 22* 23*  CREATININE 0.67  < > 0.59 0.61 0.73  CALCIUM 8.1*  < > 8.0* 8.0* 7.8*  MG 2.0  --  1.9 2.0  --   PHOS 4.0  --  3.6 2.8  --   GLUCOSE 99  < > 150* 97 162*  < > = values in this interval not displayed.  Liver fxn  Recent Labs Lab 11/17/16 0435  AST 18  ALT 20  ALKPHOS 49  BILITOT 0.3  PROT 4.4*  ALBUMIN 1.5*   ABG  Recent Labs Lab 11/16/16 1139 11/22/16 0506  PHART 7.381 7.334*  PCO2ART 46.2 61.4*  PO2ART 105.0 51.6*  HCO3 27.4 32.1*  TCO2 29  --     CBG trend  Recent Labs Lab 11/22/16 0755 11/22/16 1157 11/22/16 1656 11/22/16 2329 11/23/16 0749  GLUCAP 112* 113* 113* 169* 124*    IMAGING: I reviewed and compared with 4/3. Increasing opacities of the right lung.  DIAGNOSES: Principal Problem:   Sepsis ([place]) Active Problems:   Chronic obstructive pulmonary disease (HCC)   Acute hypoxemic respiratory failure (HCC)   Acute renal failure (ARF) (HCC)    Hyponatremia   Microcytic anemia   Hyperkalemia   Chest tube in place   Primary spontaneous pneumothorax   S/P lobectomy of lung   Empyema (HCC)   Steroid-induced hyperglycemia   ASSESSMENT / PLAN:  Acute on chronic hypercarbic respiratory failure, Multifactorial Right lower lobe necrotizing pneumonia Right empyema status post VATS decortication and right lower lobe lobectomy - Tolerated discontinuation of BiPAP, keep it available when necessary - Continue diuretics, dose daily - Chest tube will be removed today which should help with her pulmonary compliance, pain - Continue pulmonary hygiene - Continue DuoNeb when necessary - Follow one more day in step down then can likely transition out    Baltazar Apo, MD, PhD 11/23/2016, 10:03 AM [facility] [phone] or if no answer [phone]

## 2016-11-23 NOTE — Progress Notes (Signed)
[place] Progress Note Patient Name: Diana Gardner DOB: 07-Jul-1951 MRN: [id number]   Date of Service  11/23/2016  HPI/Events of Note  Bradycardia - HR = 55.   eICU Interventions  Will put hold orders for SBP < 105 or HR < 60 on Metoprolol dose.      Intervention Category Major Interventions: Arrhythmia - evaluation and management  Sommer,Steven Eugene 11/23/2016, 10:18 PM

## 2016-11-24 ENCOUNTER — Inpatient Hospital Stay (HOSPITAL_COMMUNITY): Payer: Medicare Other

## 2016-11-24 DIAGNOSIS — J9622 Acute and chronic respiratory failure with hypercapnia: Secondary | ICD-10-CM

## 2016-11-24 DIAGNOSIS — J850 Gangrene and necrosis of lung: Secondary | ICD-10-CM

## 2016-11-24 DIAGNOSIS — J9621 Acute and chronic respiratory failure with hypoxia: Secondary | ICD-10-CM

## 2016-11-24 LAB — BASIC METABOLIC PANEL
Anion gap: 8 (ref 5–15)
BUN: 20 mg/dL (ref 6–20)
CO2: 32 mmol/L (ref 22–32)
Calcium: 7.9 mg/dL — ABNORMAL LOW (ref 8.9–10.3)
Chloride: 95 mmol/L — ABNORMAL LOW (ref 101–111)
Creatinine, Ser: 0.61 mg/dL (ref 0.44–1.00)
GFR calc non Af Amer: >60 mL/min (ref >60)
Glucose, Bld: 122 mg/dL — ABNORMAL HIGH (ref 65–99)
POTASSIUM: 3.6 mmol/L (ref 3.5–5.1)
SODIUM: 135 mmol/L (ref 135–145)

## 2016-11-24 LAB — GLUCOSE, CAPILLARY
GLUCOSE-CAPILLARY: 158 mg/dL — AB (ref 65–99)
GLUCOSE-CAPILLARY: 173 mg/dL — AB (ref 65–99)
Glucose-Capillary: 106 mg/dL — ABNORMAL HIGH (ref 65–99)
Glucose-Capillary: 149 mg/dL — ABNORMAL HIGH (ref 65–99)

## 2016-11-24 LAB — CBC
HEMATOCRIT: 26.9 % — AB (ref 36.0–46.0)
HEMOGLOBIN: 8.4 g/dL — AB (ref 12.0–15.0)
MCH: 27.5 pg (ref 26.0–34.0)
MCHC: 31.2 g/dL (ref 30.0–36.0)
MCV: 87.9 fL (ref 78.0–100.0)
Platelets: 514 10*3/uL — ABNORMAL HIGH (ref 150–400)
RBC: 3.06 MIL/uL — AB (ref 3.87–5.11)
RDW: 17 % — ABNORMAL HIGH (ref 11.5–15.5)
WBC: 6.2 10*3/uL (ref 4.0–10.5)

## 2016-11-24 NOTE — Progress Notes (Signed)
PULMONARY  / CRITICAL CARE MEDICINE  Name: Diana Gardner MRN: [id number] DOB: 1950-11-07    LOS: 90  REFERRING MD :  [facility]  CHIEF COMPLAINT:  Acute on chronic hypercarbic respiratory failure  BRIEF PATIENT DESCRIPTION: Ms. Diana Gardner is a 66 year old woman with COPD on home oxygen hospitalized for RLL necrotizing CAP complicated by pneumothorax and empyema s/p VATS and lobectomy who developed acute on chronic hypercarbic respiratory failure.  LINES / TUBES:  3/20 Radial a-line >> 3/25 3/28 Chest tube   CULTURES: 3/20 Tracheal aspirate >> NGTD x 2 days 3/21 Respiratory viral panel negative 3/21 Blood cultures x 2 >> NGTD x 5 days 3/28 R pleural fluid NGTD x 5 days  ANTIBIOTICS: Azithromycin 3/18  Ceftriaxone 3/18 >> 3/21 Vanc 3/21 >> 3/21 Unasyn 3/21  SIGNIFICANT EVENTS:  3/18 Admitted 3/20 transfer to ICU and intubated 3/21 refractory shock 3/22 Right PTX > chest tube placed. Pressors stopped>>out 4/5 3/28 VATS, right mini thoracotomy, drainage of empyema, decortication, right lower lobectomy. 4/02 Extubated 4/03 Re-admitted to ICU for acute hypercarbic respiratory failure. Improved on BiPAP.  DIET:  Heart Healthy/Carb Mod  DVT Px:  SCDs & enoxaparin  GI Px:  Pantoprazole  INTERVAL HISTORY:   Comfortable chest tube out  VITAL SIGNS: Temp:  [97.5 F (36.4 C)-98.4 F (36.9 C)] 97.6 F (36.4 C) (04/06 0810) Pulse Rate:  [55-70] 70 (04/06 0810) Resp:  [17-28] 19 (04/06 0810) BP: (129-154)/(45-65) 135/65 (04/06 0810) SpO2:  [96 %-100 %] 99 % (04/06 1102)   VENTILATOR SETTINGS:    INTAKE / OUTPUT: Intake/Output      04/05 0701 - 04/06 0700 04/06 0701 - 04/07 0700   P.O. 60 240   IV Piggyback     Total Intake(mL/kg) 60 (0.9) 240 (3.7)   Urine (mL/kg/hr) 575 (0.4)    Stool 0 (0)    Chest Tube 20 (0)    Total Output 595     Net -535 +240        Stool Occurrence 1 x      PHYSICAL EXAMINATION: .Physical Exam  Constitutional: She is oriented to  person, place, and time. She appears unhealthy. No distress.  HENT:  Head: Normocephalic and atraumatic.  Eyes: Conjunctivae are normal. No scleral icterus.  Cardiovascular: Normal rate and regular rhythm.   Pulmonary/Chest: No respiratory distress. She has wheezes. She has rales.  Bilateral basilar crackles  Abdominal: Soft. Bowel sounds are normal. She exhibits no distension.  Neurological: She is alert and oriented to person, place, and time.  Skin: She is not diaphoretic.    LABS: Cbc  Recent Labs Lab 11/21/16 0410 11/22/16 0340 11/24/16 0212  WBC 5.9 6.5 6.2  HGB 8.0* 8.3* 8.4*  HCT 26.2* 27.4* 26.9*  PLT 541* 545* 514*    Chemistry   Recent Labs Lab 11/20/16 0532  11/21/16 0410 11/22/16 0340 11/23/16 0249 11/24/16 0212  NA 143  < > 141 140 137 135  K 3.2*  < > 3.0* 4.6 4.1 3.6  CL 95*  < > 99* 102 97* 95*  CO2 41*  < > 38* 34* 34* 32  BUN 31*  < > 27* 22* 23* 20  CREATININE 0.67  < > 0.59 0.61 0.73 0.61  CALCIUM 8.1*  < > 8.0* 8.0* 7.8* 7.9*  MG 2.0  --  1.9 2.0  --   --   PHOS 4.0  --  3.6 2.8  --   --   GLUCOSE 99  < > 150* 97  162* 122*  < > = values in this interval not displayed.  Liver fxn No results for input(s): AST, ALT, ALKPHOS, BILITOT, PROT, ALBUMIN in the last 168 hours. ABG  Recent Labs Lab 11/22/16 0506  PHART 7.334*  PCO2ART 61.4*  PO2ART 51.6*  HCO3 32.1*    CBG trend  Recent Labs Lab 11/23/16 0749 11/23/16 1102 11/23/16 1609 11/23/16 2129 11/24/16 0851  GLUCAP 124* 182* 179* 134* 106*    IMAGING: I reviewed and compared with 4/3. Increasing opacities of the right lung.  DIAGNOSES: Principal Problem:   Sepsis ([place]) Active Problems:   Chronic obstructive pulmonary disease (HCC)   Acute hypoxemic respiratory failure (HCC)   Acute renal failure (ARF) (HCC)   Hyponatremia   Microcytic anemia   Hyperkalemia   Chest tube in place   Primary spontaneous pneumothorax   S/P lobectomy of lung   Empyema (HCC)    Steroid-induced hyperglycemia   ASSESSMENT / PLAN:  Acute on chronic hypercarbic respiratory failure, Multifactorial Right lower lobe necrotizing pneumonia Right empyema status post VATS decortication and right lower lobe lobectomy - Tolerated discontinuation of BiPAP, keep it available when necessary - Continue diuretics, dose daily -Abx per CVTS day 14 of unasyn - Chest tube removed 4/5 which should help with her pulmonary compliance, pain - Continue pulmonary hygiene - Continue DuoNeb when necessary - Follow one more day in step down then can likely transition out.  -PCCM will transfer her care to [facility] and sign off. Please call if needed.    Richardson Landry  ACNP [facility] PCCM Pager [phone] till 3 pm If no answer page [phone] 11/24/2016, 11:12 AM

## 2016-11-24 NOTE — Progress Notes (Signed)
9 Days Post-Op Procedure(s) (LRB): VIDEO BRONCHOSCOPY (N/A) VIDEO ASSISTED THORACOSCOPY (VATS), MINI THORACOTOMY, DRAINAGE OF EMPYEMA, DECORTICATION, RIGHT LOWER LOBE RESECTION (Right) Subjective: Some incisional pain  Objective: Vital signs in last 24 hours: Temp:  [97.5 F (36.4 C)-98.4 F (36.9 C)] 97.9 F (36.6 C) (04/06 0501) Pulse Rate:  [55-69] 62 (04/06 0501) Cardiac Rhythm: Sinus bradycardia (04/06 0700) Resp:  [17-28] 19 (04/06 0501) BP: (112-154)/(45-65) 136/54 (04/06 0501) SpO2:  [93 %-100 %] 100 % (04/06 0737)  Hemodynamic parameters for last 24 hours:    Intake/Output from previous day: 04/05 0701 - 04/06 0700 In: 60 [P.O.:60] Out: 595 [Urine:575; Chest Tube:20] Intake/Output this shift: No intake/output data recorded.  General appearance: alert, cooperative and no distress Neurologic: intact Heart: regular rate and rhythm Lungs: diminished breath sounds right base, no wheezing Wound: clean and dry  Lab Results:  Recent Labs  11/22/16 0340 11/24/16 0212  WBC 6.5 6.2  HGB 8.3* 8.4*  HCT 27.4* 26.9*  PLT 545* 514*   BMET:  Recent Labs  11/23/16 0249 11/24/16 0212  NA 137 135  K 4.1 3.6  CL 97* 95*  CO2 34* 32  GLUCOSE 162* 122*  BUN 23* 20  CREATININE 0.73 0.61  CALCIUM 7.8* 7.9*    PT/INR: No results for input(s): LABPROT, INR in the last 72 hours. ABG    Component Value Date/Time   PHART 7.334 (L) 11/22/2016 0506   HCO3 32.1 (H) 11/22/2016 0506   TCO2 29 11/16/2016 1139   ACIDBASEDEF 12.0 (H) 11/07/2016 2005   O2SAT 82.1 11/22/2016 0506   CBG (last 3)   Recent Labs  11/23/16 1102 11/23/16 1609 11/23/16 2129  GLUCAP 182* 179* 134*    Assessment/Plan: S/P Procedure(s) (LRB): VIDEO BRONCHOSCOPY (N/A) VIDEO ASSISTED THORACOSCOPY (VATS), MINI THORACOTOMY, DRAINAGE OF EMPYEMA, DECORTICATION, RIGHT LOWER LOBE RESECTION (Right) -Continues to slowly improve Afebrile on unasyn Chest tubes out- CXR ok Got up to chair yesterday-  continue to mobilize as tolerated Continue pulmonary hygiene   LOS: 19 days    Diana Gardner 11/24/2016

## 2016-11-24 NOTE — Progress Notes (Signed)
Progress Note    Diana Gardner  WFU:[id number] DOB: 05-Apr-1951  DOA: 11/05/2016 PCP: [name], MD    Brief Narrative:   Chief complaint: Pneumonia, COPD, pleural effusion.  Medical records reviewed and are as summarized below:  Diana Gardner is an 66 y.o. female with a PMH of COPD, chronic respiratory failure on home oxygen who was admitted 11/05/16 for a right lower lobe necrotizing community-acquired pneumonia complicated by pneumothorax and empyema status post right facets and right lower lobectomy on 11/15/16. She was intubated from 11/07/16 arrow forward 11/20/16 and under the care of the critical care team. She also required pressor support, which was weaned 11/09/16. Care transferred to [facility] 11/22/16.  Assessment/Plan:   Principal Problem:   Sepsis (HCC)/Septic shock Admitted and placed on empiric Rocephin/azithromycin. Antibiotics broadened to vancomycin and Unasyn on 11/08/16. Sputum cultures on admission were negative. Blood cultures were negative. Respiratory virus panel was negative. Urinary strep antigen was positive. Pressors were weaned 11/09/16. Remains on Unasyn. Critical care team recommended a 2-4 week course of Unasyn.The patient continues to improve and is now off BiPAP.  Active Problems:   Chronic obstructive pulmonary disease (HCC) with acute exacerbation Initially placed on IV steroids which have been weaned to oral prednisone with taper. Continue bronchodilators. Scattered rhonchi remain on lung exam, but breathing status has improved and she is now off BiPAP.    Acute on chronic hypoxemic respiratory failure (HCC) complicated by primary spontaneous right pneumothorax requiring chest tube placement, status post right lower lobectomy Intubated on admission. Chest tube placed 11/09/16 secondary to right pneumothorax. Underwent a right VATS and right mini thoracotomy with drainage of empyema, decortication and right lower lobectomy on 11/15/16. Extubated 11/20/16  to nasal cannula. Anterior chest tube discontinued 11/20/16. Continue nebs. Respiratory status slowly improving. Chest x-ray personally reviewed and shows bibasilar atelectasis with small pleural effusions. Respiratory status remains fragile.    Acute renal failure (ARF) (HCC) Secondary to hypoperfusion in the setting of NSAID and ACE inhibitor use. Resolved.    Hyponatremia  Resolved.    Microcytic anemia Hemoglobin stable at 8.4 this morning.    Hypokalemia/Hyperkalemia Potassium currently within normal limits.    Hyperlipidemia Continue Lipitor.    Hypertension Continue metoprolol and when necessary labetalol.    Steroid induced hyperglycemia CBGs 106-182. Continue insulin sensitive SSI.   Family Communication/Anticipated D/C date and plan/Code Status   DVT prophylaxis: Lovenox ordered. Code Status: Full Code.  Family Communication: Daughter at the bedside. Disposition Plan: Home versus SNF depending on progress.   Medical Consultants:    Pulmonology  CVTS    Procedures:   2-D echo 11/08/16  Study Conclusions  - Left ventricle: The cavity size was normal. Systolic function was   vigorous. The estimated ejection fraction was in the range of 65%   to 70%. Wall motion was normal; there were no regional wall   motion abnormalities. Doppler parameters are consistent with   abnormal left ventricular relaxation (grade 1 diastolic   dysfunction). LV mid cavity gradient, peak 22 mmHg. - Aortic valve: There was no stenosis. - Mitral valve: There was no significant regurgitation. - Right ventricle: The cavity size was normal. Systolic function   was normal. - Pulmonary arteries: No complete TR doppler jet so unable to   estimate PA systolic pressure. - Inferior vena cava: The vessel was normal in size. The   respirophasic diameter changes were in the normal range (>= 50%),   consistent with normal central venous  pressure.  11/15/16 PROCEDURE:  BRONCHOSCOPY RIGHT  VATS/ MINI THORRACOTOMY DRAINAGE OF EMPYEMA AND DECORTICATION RIGHT LOWER LOBECTOMY  Anti-Infectives:   Anti-infectives    Start     Dose/Rate Route Frequency Ordered Stop   11/16/16 0000  vancomycin (VANCOCIN) IVPB 1000 mg/200 mL premix     1,000 mg 200 mL/hr over 60 Minutes Intravenous Every 12 hours 11/15/16 1711 11/16/16 0100   11/15/16 0715  vancomycin (VANCOCIN) IVPB 1000 mg/200 mL premix     1,000 mg 200 mL/hr over 60 Minutes Intravenous On call to O.R. 11/15/16 0706 11/15/16 1315   11/10/16 0142  Ampicillin-Sulbactam (UNASYN) 3 g in sodium chloride 0.9 % 100 mL IVPB     3 g 200 mL/hr over 30 Minutes Intravenous Every 8 hours 11/09/16 1809     11/08/16 1700  Ampicillin-Sulbactam (UNASYN) 3 g in sodium chloride 0.9 % 100 mL IVPB  Status:  Discontinued     3 g 200 mL/hr over 30 Minutes Intravenous Every 12 hours 11/08/16 1632 11/09/16 1809   11/07/16 2100  vancomycin (VANCOCIN) IVPB 1000 mg/200 mL premix  Status:  Discontinued     1,000 mg 200 mL/hr over 60 Minutes Intravenous Every 48 hours 11/05/16 2144 11/06/16 0347   11/06/16 2100  cefTRIAXone (ROCEPHIN) 1 g in dextrose 5 % 50 mL IVPB  Status:  Discontinued     1 g 100 mL/hr over 30 Minutes Intravenous Every 24 hours 11/05/16 2326 11/08/16 1609   11/06/16 2100  azithromycin (ZITHROMAX) tablet 500 mg  Status:  Discontinued     500 mg Oral Every 24 hours 11/05/16 2326 11/07/16 1452   11/05/16 2200  azithromycin (ZITHROMAX) 500 mg in dextrose 5 % 250 mL IVPB     500 mg 250 mL/hr over 60 Minutes Intravenous  Once 11/05/16 2154 11/05/16 2315   11/05/16 1945  vancomycin (VANCOCIN) IVPB 1000 mg/200 mL premix     1,000 mg 200 mL/hr over 60 Minutes Intravenous  Once 11/05/16 1944 11/05/16 2209   11/05/16 1930  cefTRIAXone (ROCEPHIN) 1 g in dextrose 5 % 50 mL IVPB     1 g 100 mL/hr over 30 Minutes Intravenous  Once 11/05/16 1926 11/05/16 2023   11/05/16 1930  azithromycin (ZITHROMAX) 500 mg in dextrose 5 % 250 mL IVPB  Status:   Discontinued     500 mg 250 mL/hr over 60 Minutes Intravenous  Once 11/05/16 1926 11/05/16 2014      Subjective:   Patient continues to be fragile and weak. Dyspnea slowly improving, still with a moist but mainly nonproductive cough. Denies pain. Appetite remains fair.  Objective:    Vitals:   11/24/16 0045 11/24/16 0501 11/24/16 0737 11/24/16 0810  BP: (!) 147/58 (!) 136/54  135/65  Pulse: (!) 56 62  70  Resp: (!) 22 19  19   Temp: 97.8 F (36.6 C) 97.9 F (36.6 C)  97.6 F (36.4 C)  TempSrc: Oral Oral  Oral  SpO2: 98% 99% 100% 100%  Weight:      Height:        Intake/Output Summary (Last 24 hours) at 11/24/16 0855 Last data filed at 11/23/16 2104  Gross per 24 hour  Intake               60 ml  Output              575 ml  Net             -515 ml   [employer]  11/21/16 0400 11/22/16 0443 11/22/16 0600  Weight: 67.7 kg (149 lb 4 oz) 68.4 kg (150 lb 12.7 oz) 64.2 kg (141 lb 8.6 oz)    Exam: General exam: Awake and alert.Weak. Respiratory system: Decrease in bilateral rhonchi. Cardiovascular system: S1 & S2 heard, RRR. No JVD,  rubs, gallops or clicks. No murmurs. Gastrointestinal system: Abdomen is nondistended, soft and nontender. No organomegaly or masses felt. Normal bowel sounds heard. Central nervous system: Alert and oriented. No focal neurological deficits. Extremities: No clubbing,  or cyanosis. No edema. Skin: Foam dressing to sacrum, right surgical site without signs of infection. Psychiatry: Judgement and insight appear normal. Mood & affect appropriate.   Data Reviewed:   I have personally reviewed following labs and imaging studies:  Labs: Basic Metabolic Panel:  Recent Labs Lab 11/18/16 0441 11/19/16 0400 11/20/16 0532  11/20/16 1645 11/21/16 0410 11/22/16 0340 11/23/16 0249 11/24/16 0212  NA  --  144 143  < > 142 141 140 137 135  K  --  3.4* 3.2*  < > 4.0 3.0* 4.6 4.1 3.6  CL  --  101 95*  < > 97* 99* 102 97* 95*  CO2  --  37* 41*   < > 37* 38* 34* 34* 32  GLUCOSE  --  124* 99  < > 136* 150* 97 162* 122*  BUN  --  32* 31*  < > 32* 27* 22* 23* 20  CREATININE  --  0.64 0.67  < > 0.67 0.59 0.61 0.73 0.61  CALCIUM  --  8.1* 8.1*  < > 8.1* 8.0* 8.0* 7.8* 7.9*  MG 1.9 2.3 2.0  --   --  1.9 2.0  --   --   PHOS 3.2 3.7 4.0  --   --  3.6 2.8  --   --   < > = values in this interval not displayed. GFR Estimated Creatinine Clearance: 58.9 mL/min (by C-G formula based on SCr of 0.61 mg/dL). Liver Function Tests: No results for input(s): AST, ALT, ALKPHOS, BILITOT, PROT, ALBUMIN in the last 168 hours. No results for input(s): LIPASE, AMYLASE in the last 168 hours. No results for input(s): AMMONIA in the last 168 hours. Coagulation profile No results for input(s): INR, PROTIME in the last 168 hours.  CBC:  Recent Labs Lab 11/18/16 0441 11/19/16 0400 11/20/16 0532 11/21/16 0410 11/22/16 0340 11/24/16 0212  WBC 10.0 7.5 6.9 5.9 6.5 6.2  NEUTROABS 7.9* 5.7 4.8 3.9  --   --   HGB 8.1* 7.6* 7.7* 8.0* 8.3* 8.4*  HCT 26.1* 24.1* 24.8* 26.2* 27.4* 26.9*  MCV 88.8 89.3 88.6 89.4 90.7 87.9  PLT 420* 406* 482* 541* 545* 514*   Cardiac Enzymes: No results for input(s): CKTOTAL, CKMB, CKMBINDEX, TROPONINI in the last 168 hours. BNP (last 3 results) No results for input(s): PROBNP in the last 8760 hours. CBG:  Recent Labs Lab 11/23/16 0749 11/23/16 1102 11/23/16 1609 11/23/16 2129 11/24/16 0851  GLUCAP 124* 182* 179* 134* 106*   D-Dimer: No results for input(s): DDIMER in the last 72 hours. Hgb A1c: No results for input(s): HGBA1C in the last 72 hours. Lipid Profile: No results for input(s): CHOL, HDL, LDLCALC, TRIG, CHOLHDL, LDLDIRECT in the last 72 hours. Thyroid function studies: No results for input(s): TSH, T4TOTAL, T3FREE, THYROIDAB in the last 72 hours.  Invalid input(s): FREET3 Anemia work up: No results for input(s): VITAMINB12, FOLATE, FERRITIN, TIBC, IRON, RETICCTPCT in the last 72 hours. Sepsis  Labs:  Microbiology Recent  Results (from the past 240 hour(s))  Culture, respiratory (NON-Expectorated)     Status: None   Collection Time: 11/15/16 12:32 PM  Result Value Ref Range Status   Specimen Description BRONCHIAL ALVEOLAR LAVAGE  Final   Special Requests NONE  Final   Gram Stain   Final    MODERATE WBC PRESENT, PREDOMINANTLY PMN RARE SQUAMOUS EPITHELIAL CELLS PRESENT RARE GRAM POSITIVE COCCI IN PAIRS RARE GRAM NEGATIVE COCCOBACILLI    Culture Consistent with normal respiratory flora.  Final   Report Status 11/17/2016 FINAL  Final  Aerobic/Anaerobic Culture (surgical/deep wound)     Status: None   Collection Time: 11/15/16  1:18 PM  Result Value Ref Range Status   Specimen Description TISSUE RIGHT PLEURAL  Final   Special Requests   Final    RIGHT PLEURAL PEEL PATIENT ON FOLLOWING  VANCOMYCIN   Gram Stain   Final    ABUNDANT WBC PRESENT, PREDOMINANTLY PMN NO ORGANISMS SEEN    Culture No growth aerobically or anaerobically.  Final   Report Status 11/20/2016 FINAL  Final  Culture, body fluid-bottle     Status: None   Collection Time: 11/15/16  1:47 PM  Result Value Ref Range Status   Specimen Description PLEURAL RIGHT  Final   Special Requests POF VANC  Final   Culture NO GROWTH 5 DAYS  Final   Report Status 11/20/2016 FINAL  Final  Gram stain     Status: None   Collection Time: 11/15/16  1:47 PM  Result Value Ref Range Status   Specimen Description PLEURAL RIGHT  Final   Special Requests NONE  Final   Gram Stain   Final    ABUNDANT WBC PRESENT, PREDOMINANTLY PMN NO ORGANISMS SEEN    Report Status 11/15/2016 FINAL  Final  Aerobic/Anaerobic Culture (surgical/deep wound)     Status: None   Collection Time: 11/15/16  2:06 PM  Result Value Ref Range Status   Specimen Description ABSCESS RIGHT LOWER LUNG  Final   Special Requests PT ON VANC  Final   Gram Stain   Final    ABUNDANT WBC PRESENT,BOTH PMN AND MONONUCLEAR NO ORGANISMS SEEN    Culture No growth  aerobically or anaerobically.  Final   Report Status 11/20/2016 FINAL  Final    Radiology: Dg Chest 2 View  Result Date: 11/24/2016 CLINICAL DATA:  Recent right lower lobectomy for empyema. Currently with cough EXAM: CHEST  2 VIEW COMPARISON:  November 23, 2016 FINDINGS: There has been partial clearing of airspace consolidation from the lung bases compared to 1 day prior. There remains patchy bibasilar atelectasis with small pleural effusions bilaterally. Lungs elsewhere are clear. Note that there is volume loss on the right consistent with previous surgery. Heart size is normal. The pulmonary vascular is within normal limits. No adenopathy. No bone lesions. IMPRESSION: Bibasilar atelectasis with small pleural effusions. No consolidation. Stable volume loss on the right. No new opacity. Stable cardiac silhouette. No evident pneumothorax. Electronically Signed   By: Lowella Grip III M.D.   On: 11/24/2016 08:38   Dg Chest Port 1 View  Result Date: 11/23/2016 CLINICAL DATA:  CHEST TUBE REMOVAL/RIGHT EXAM: PORTABLE CHEST 1 VIEW COMPARISON:  11/23/2016 FINDINGS: Right chest tube has been removed. No pneumothorax. Heart size is accentuated by the AP portable technique. There are patchy densities in the lung bases bilaterally which partially obscure the hemidiaphragms, increased on the left. Opacity at the left lung base now obscures the hemidiaphragm. No pulmonary edema. IMPRESSION: Bilateral lower lobe  opacities, increased on the left. No pneumothorax following removal of chest tube. Electronically Signed   By: Nolon Nations M.D.   On: 11/23/2016 12:53   Dg Chest Port 1 View  Result Date: 11/23/2016 CLINICAL DATA:  Shortness of breath.  Pleural effusion . EXAM: PORTABLE CHEST 1 VIEW COMPARISON:  11/22/2016.  11/21/2016 . FINDINGS: Right chest tube in stable position. No pneumothorax. Stable cardiomegaly. Interim improvement of pulmonary interstitial prominence suggesting improvement of pulmonary  interstitial edema. Tiny bilateral pleural effusions. Stable biapical pleural thickening most likely secondary scarring . Prominent skin folds noted over the chest. IMPRESSION: 1. Right chest tube in stable position.  No pneumothorax. 2. Stable cardiomegaly. Interim improvement of pulmonary interstitial prominence suggesting improving interstitial edema. Tiny bilateral pleural effusions again noted. Electronically Signed   By: Marcello Moores  Register   On: 11/23/2016 07:34    Medications:   . ampicillin-sulbactam (UNASYN) IV  3 g Intravenous Q8H  . atorvastatin  20 mg Oral Daily  . chlorhexidine  15 mL Mouth Rinse BID  . enoxaparin (LOVENOX) injection  40 mg Subcutaneous Q24H  . feeding supplement (ENSURE ENLIVE)  237 mL Oral BID BM  . guaiFENesin  1,200 mg Oral BID  . insulin aspart  0-9 Units Subcutaneous TID WC  . ipratropium-albuterol  3 mL Inhalation QID  . mouth rinse  15 mL Mouth Rinse q12n4p  . metoprolol tartrate  100 mg Oral BID  . pantoprazole  40 mg Oral Daily  . predniSONE  20 mg Oral Q breakfast   Followed by  . [START ON 11/27/2016] predniSONE  10 mg Oral Q breakfast   Followed by  . [START ON 11/30/2016] predniSONE  5 mg Oral Q breakfast   Continuous Infusions:  Medical decision making is of high complexity and this patient is at high risk of deterioration, therefore this is a level 3 visit.  (> 4 problem points, >4 data points, high risk:)    LOS: 19 days   ,  [facility] Pager [phone]. If unable to reach me by pager, please call my cell phone at [phone].  *Please refer to [email or web address], password [email or web address] to get updated schedule on who will round on this patient, as hospitalists switch teams weekly. If 7PM-7AM, please contact night-coverage at [email or web address], password [email or web address] for any overnight needs.  11/24/2016, 8:55 AM

## 2016-11-24 NOTE — Care Management Note (Addendum)
Case Management Note  Patient Details  Name: Diana Gardner MRN: [id number] Date of Birth: Feb 09, 1951  Subjective/Objective:    Pt admitted with pleural effusions                Action/Plan:  Patient from home with base line COPD and is 2-3 liter O2 dependent at home on combivent and albuterol.  Pt is now ventilated - no family at bedside    Expected Discharge Date:                  Expected Discharge Plan:     In-House Referral:     Discharge planning Services  CM Consult  Post Acute Care Choice:    Choice offered to:     DME Arranged:    DME Agency:     HH Arranged:    HH Agency:     Status of Service:  In process, will continue to follow  If discussed at Long Length of Stay Meetings, dates discussed:    Additional Comments: 11/24/2016 Pt is s/p VATS - CT removed, IV antibiotics.   SNF recommended - CSW consulted  11/15/16 Pt went for VATS procedure today Maryclare Labrador, RN 11/24/2016, 9:14 AM

## 2016-11-24 NOTE — Progress Notes (Signed)
Dr Emmit Alexanders notified of pts HR, orders received for parameters for lopressor

## 2016-11-25 ENCOUNTER — Inpatient Hospital Stay (HOSPITAL_COMMUNITY): Payer: Medicare Other

## 2016-11-25 ENCOUNTER — Encounter (HOSPITAL_COMMUNITY): Payer: Self-pay | Admitting: Internal Medicine

## 2016-11-25 DIAGNOSIS — D75839 Thrombocytosis, unspecified: Secondary | ICD-10-CM

## 2016-11-25 DIAGNOSIS — D473 Essential (hemorrhagic) thrombocythemia: Secondary | ICD-10-CM

## 2016-11-25 DIAGNOSIS — D649 Anemia, unspecified: Secondary | ICD-10-CM | POA: Diagnosis present

## 2016-11-25 LAB — CBC
HCT: 27.7 % — ABNORMAL LOW (ref 36.0–46.0)
Hemoglobin: 8.6 g/dL — ABNORMAL LOW (ref 12.0–15.0)
MCH: 27.6 pg (ref 26.0–34.0)
MCHC: 31 g/dL (ref 30.0–36.0)
MCV: 88.8 fL (ref 78.0–100.0)
Platelets: 499 10*3/uL — ABNORMAL HIGH (ref 150–400)
RBC: 3.12 MIL/uL — AB (ref 3.87–5.11)
RDW: 17.1 % — AB (ref 11.5–15.5)
WBC: 6 10*3/uL (ref 4.0–10.5)

## 2016-11-25 LAB — GLUCOSE, CAPILLARY
GLUCOSE-CAPILLARY: 168 mg/dL — AB (ref 65–99)
GLUCOSE-CAPILLARY: 116 mg/dL — AB (ref 65–99)
Glucose-Capillary: 113 mg/dL — ABNORMAL HIGH (ref 65–99)
Glucose-Capillary: 209 mg/dL — ABNORMAL HIGH (ref 65–99)

## 2016-11-25 LAB — BASIC METABOLIC PANEL
Anion gap: 10 (ref 5–15)
BUN: 19 mg/dL (ref 6–20)
CALCIUM: 8 mg/dL — AB (ref 8.9–10.3)
CO2: 29 mmol/L (ref 22–32)
CREATININE: 0.54 mg/dL (ref 0.44–1.00)
Chloride: 98 mmol/L — ABNORMAL LOW (ref 101–111)
GFR calc Af Amer: >60 mL/min (ref >60)
Glucose, Bld: 109 mg/dL — ABNORMAL HIGH (ref 65–99)
Potassium: 4 mmol/L (ref 3.5–5.1)
Sodium: 137 mmol/L (ref 135–145)

## 2016-11-25 LAB — C DIFFICILE QUICK SCREEN W PCR REFLEX
C DIFFICILE (CDIFF) TOXIN: NEGATIVE
C DIFFICLE (CDIFF) ANTIGEN: NEGATIVE
C Diff interpretation: NOT DETECTED

## 2016-11-25 MED ORDER — VANCOMYCIN HCL IN DEXTROSE 1-5 GM/200ML-% IV SOLN
1000.0000 mg | Freq: Once | INTRAVENOUS | Status: AC
  Administered 2016-11-25: 1000 mg via INTRAVENOUS
  Filled 2016-11-25: qty 200

## 2016-11-25 MED ORDER — BUDESONIDE 0.25 MG/2ML IN SUSP
0.2500 mg | Freq: Two times a day (BID) | RESPIRATORY_TRACT | Status: DC
  Administered 2016-11-25 – 2016-11-28 (×6): 0.25 mg via RESPIRATORY_TRACT
  Filled 2016-11-25 (×6): qty 2

## 2016-11-25 MED ORDER — DEXTROSE 5 % IV SOLN
1.0000 g | Freq: Three times a day (TID) | INTRAVENOUS | Status: DC
  Administered 2016-11-25 – 2016-12-03 (×24): 1 g via INTRAVENOUS
  Filled 2016-11-25 (×26): qty 1

## 2016-11-25 MED ORDER — IPRATROPIUM-ALBUTEROL 0.5-2.5 (3) MG/3ML IN SOLN
3.0000 mL | RESPIRATORY_TRACT | Status: DC
  Administered 2016-11-25 – 2016-11-27 (×10): 3 mL via RESPIRATORY_TRACT
  Filled 2016-11-25 (×10): qty 3

## 2016-11-25 MED ORDER — ACETYLCYSTEINE 20 % IN SOLN
4.0000 mL | RESPIRATORY_TRACT | Status: DC
  Administered 2016-11-25 – 2016-11-27 (×10): 4 mL via RESPIRATORY_TRACT
  Filled 2016-11-25 (×11): qty 4

## 2016-11-25 MED ORDER — SACCHAROMYCES BOULARDII 250 MG PO CAPS
250.0000 mg | ORAL_CAPSULE | Freq: Two times a day (BID) | ORAL | Status: DC
  Administered 2016-11-25 – 2016-12-04 (×18): 250 mg via ORAL
  Filled 2016-11-25 (×18): qty 1

## 2016-11-25 MED ORDER — IPRATROPIUM-ALBUTEROL 0.5-2.5 (3) MG/3ML IN SOLN: 3.0000 mL | Freq: Three times a day (TID) | RESPIRATORY_TRACT | Status: DC

## 2016-11-25 MED ORDER — VANCOMYCIN HCL IN DEXTROSE 750-5 MG/150ML-% IV SOLN
750.0000 mg | Freq: Two times a day (BID) | INTRAVENOUS | Status: DC
  Administered 2016-11-26 – 2016-12-03 (×15): 750 mg via INTRAVENOUS
  Filled 2016-11-25 (×17): qty 150

## 2016-11-25 NOTE — Progress Notes (Signed)
Patient called out for RN stating she felt SOB all of a sudden; face flushed; obvious labored breathing noted; desats down to 78-80% on O2 @ 2L/[place].  O2 increased to 6L/[place] and patient repositionwed more upright in bed.  LS auscultated finding no breath sounds on the right side; CTA on the left.  Dr. Maudie Mercury [facility]) paged for stat portable CXR.  Dr. Servando Snare (CVTS) paged - states they signed off yesterday.  [facility]/CCM called per Dr. Maudie Mercury.  Awaiting CXR results and CCM to bedside for tx.

## 2016-11-25 NOTE — Progress Notes (Signed)
Patient ID: Diana Gardner, female   DOB: Apr 05, 1951, 66 y.o.   MRN: [id number]                                                                PROGRESS NOTE                                                                                                                                                                                                             Patient Demographics:    Diana Gardner, is a 66 y.o. female, DOB - [birth year], YWV:[id number]  Admit date - 11/05/2016   Admitting Physician Edwin Dada, MD  Outpatient Primary MD for the patient is [name], MD  LOS - 20  Outpatient Specialists:  Chief Complaint  Patient presents with  . Shortness of Breath       Brief Narrative  66 y.o. female with a PMH of COPD, chronic respiratory failure on home oxygen who was admitted 11/05/16 for a right lower lobe necrotizing community-acquired pneumonia complicated by pneumothorax and empyema status post right facets and right lower lobectomy on 11/15/16. She was intubated from 11/07/16 arrow forward 11/20/16 and under the care of the critical care team. She also required pressor support, which was weaned 11/09/16. Care transferred to [facility] 11/22/16.   Subjective:    Chrys Landgrebe today has slight cough,  Dry.  Slight pain where chest tube procedure done.  Denies fever, chills, palp, sob, n/v, diarrhea, brbpr.  Pt sitting eating this earlier this am.  Now pt is hypoxic,  Dropped to 78%,     Assessment  & Plan :    Principal Problem:   Sepsis ([place]) Active Problems:   Chronic obstructive pulmonary disease (HCC)   Acute hypoxemic respiratory failure (HCC)   Acute renal failure (ARF) (HCC)   Hyponatremia   Microcytic anemia   Hyperkalemia   Chest tube in place   Primary spontaneous pneumothorax   S/P lobectomy of lung   Empyema (HCC)   Steroid-induced hyperglycemia     Acute respiratory failure  ? Mucous plug CXR portable stat pending 100% ventimask vanco iv  pharmacy to dose, cefepime pharmacy to dose for ? hcap  Pccm reconsulted  For possible bronch   Code Status : FULL CODE  Family Communication  : w patient  Disposition Plan  :  Eventually home  Barriers For Discharge :   Consults  :  pccm  Procedures  :   -D echo 11/08/16  Study Conclusions  - Left ventricle: The cavity size was normal. Systolic function was vigorous. The estimated ejection fraction was in the range of 65% to 70%. Wall motion was normal; there were no regional wall motion abnormalities. Doppler parameters are consistent with abnormal left ventricular relaxation (grade 1 diastolic dysfunction). LV mid cavity gradient, peak 22 mmHg. - Aortic valve: There was no stenosis. - Mitral valve: There was no significant regurgitation. - Right ventricle: The cavity size was normal. Systolic function was normal. - Pulmonary arteries: No complete TR doppler jet so unable to estimate PA systolic pressure. - Inferior vena cava: The vessel was normal in size. The respirophasic diameter changes were in the normal range (>= 50%), consistent with normal central venous pressure.  11/15/16 PROCEDURE:  BRONCHOSCOPY RIGHT VATS/ MINI THORRACOTOMY DRAINAGE OF EMPYEMA AND DECORTICATION RIGHT LOWER LOBECTOMY  DVT Prophylaxis  :  Lovenox - Heparin - SCDs  Lab Results  Component Value Date   PLT 499 (H) 11/25/2016      Anti-infectives    Start     Dose/Rate Route Frequency Ordered Stop   11/16/16 0000  vancomycin (VANCOCIN) IVPB 1000 mg/200 mL premix     1,000 mg 200 mL/hr over 60 Minutes Intravenous Every 12 hours 11/15/16 1711 11/16/16 0100   11/15/16 0715  vancomycin (VANCOCIN) IVPB 1000 mg/200 mL premix     1,000 mg 200 mL/hr over 60 Minutes Intravenous On call to O.R. 11/15/16 0706 11/15/16 1315   11/10/16 0142  Ampicillin-Sulbactam (UNASYN) 3 g in sodium chloride 0.9 % 100 mL IVPB     3 g 200 mL/hr over 30 Minutes Intravenous Every 8 hours  11/09/16 1809     11/08/16 1700  Ampicillin-Sulbactam (UNASYN) 3 g in sodium chloride 0.9 % 100 mL IVPB  Status:  Discontinued     3 g 200 mL/hr over 30 Minutes Intravenous Every 12 hours 11/08/16 1632 11/09/16 1809   11/07/16 2100  vancomycin (VANCOCIN) IVPB 1000 mg/200 mL premix  Status:  Discontinued     1,000 mg 200 mL/hr over 60 Minutes Intravenous Every 48 hours 11/05/16 2144 11/06/16 0347   11/06/16 2100  cefTRIAXone (ROCEPHIN) 1 g in dextrose 5 % 50 mL IVPB  Status:  Discontinued     1 g 100 mL/hr over 30 Minutes Intravenous Every 24 hours 11/05/16 2326 11/08/16 1609   11/06/16 2100  azithromycin (ZITHROMAX) tablet 500 mg  Status:  Discontinued     500 mg Oral Every 24 hours 11/05/16 2326 11/07/16 1452   11/05/16 2200  azithromycin (ZITHROMAX) 500 mg in dextrose 5 % 250 mL IVPB     500 mg 250 mL/hr over 60 Minutes Intravenous  Once 11/05/16 2154 11/05/16 2315   11/05/16 1945  vancomycin (VANCOCIN) IVPB 1000 mg/200 mL premix     1,000 mg 200 mL/hr over 60 Minutes Intravenous  Once 11/05/16 1944 11/05/16 2209   11/05/16 1930  cefTRIAXone (ROCEPHIN) 1 g in dextrose 5 % 50 mL IVPB     1 g 100 mL/hr over 30 Minutes Intravenous  Once 11/05/16 1926 11/05/16 2023   11/05/16 1930  azithromycin (ZITHROMAX) 500 mg in dextrose 5 % 250 mL IVPB  Status:  Discontinued     500 mg 250 mL/hr over 60 Minutes Intravenous  Once 11/05/16 1926 11/05/16 2014  Objective:   Vitals:   11/25/16 0100 11/25/16 0300 11/25/16 0330 11/25/16 0400  BP:   (!) 162/68   Pulse: (!) 58 (!) 54 (!) 59 (!) 54  Resp: (!) 21 15 19  (!) 21  Temp:   98 F (36.7 C)   TempSrc:   Oral   SpO2: 100% 98% 100% 100%  Weight:      Height:        Wt Readings from Last 3 Encounters:  11/22/16 64.2 kg (141 lb 8.6 oz)  01/31/16 68.5 kg (151 lb)  11/23/15 69.4 kg (153 lb)     Intake/Output Summary (Last 24 hours) at 11/25/16 0702 Last data filed at 11/25/16 0343  Gross per 24 hour  Intake              480 ml    Output              351 ml  Net              129 ml     Physical Exam  Awake Alert, Oriented X 3, No new F.N deficits, Normal affect [place].AT,PERRAL Supple Neck,No JVD, No cervical lymphadenopathy appriciated.  Symmetrical Chest wall movement, Good air movement bilaterally, slight crackle right lung base, slight dminished bs at right lung base, evidence of prior ct on right side/back   (earlier), pt has good air movement in the left lung Now no breath sounds in the right lung RRR,No Gallops,Rubs or new Murmurs, No Parasternal Heave +ve B.Sounds, Abd Soft, No tenderness, No organomegaly appriciated, No rebound - guarding or rigidity. No Cyanosis, Clubbing or edema, No new Rash or bruise      Data Review:    CBC  Recent Labs Lab 11/19/16 0400 11/20/16 0532 11/21/16 0410 11/22/16 0340 11/24/16 0212 11/25/16 0318  WBC 7.5 6.9 5.9 6.5 6.2 6.0  HGB 7.6* 7.7* 8.0* 8.3* 8.4* 8.6*  HCT 24.1* 24.8* 26.2* 27.4* 26.9* 27.7*  PLT 406* 482* 541* 545* 514* 499*  MCV 89.3 88.6 89.4 90.7 87.9 88.8  MCH 28.1 27.5 27.3 27.5 27.5 27.6  MCHC 31.5 31.0 30.5 30.3 31.2 31.0  RDW 18.0* 17.7* 17.6* 17.9* 17.0* 17.1*  LYMPHSABS 1.1 1.4 1.3  --   --   --   MONOABS 0.8 0.6 0.6  --   --   --   EOSABS 0.0 0.1 0.1  --   --   --   BASOSABS 0.0 0.0 0.0  --   --   --     Chemistries   Recent Labs Lab 11/19/16 0400 11/20/16 0532  11/21/16 0410 11/22/16 0340 11/23/16 0249 11/24/16 0212 11/25/16 0318  NA 144 143  < > 141 140 137 135 137  K 3.4* 3.2*  < > 3.0* 4.6 4.1 3.6 4.0  CL 101 95*  < > 99* 102 97* 95* 98*  CO2 37* 41*  < > 38* 34* 34* 32 29  GLUCOSE 124* 99  < > 150* 97 162* 122* 109*  BUN 32* 31*  < > 27* 22* 23* 20 19  CREATININE 0.64 0.67  < > 0.59 0.61 0.73 0.61 0.54  CALCIUM 8.1* 8.1*  < > 8.0* 8.0* 7.8* 7.9* 8.0*  MG 2.3 2.0  --  1.9 2.0  --   --   --   < > = values in this interval not  displayed. ------------------------------------------------------------------------------------------------------------------ No results for input(s): CHOL, HDL, LDLCALC, TRIG, CHOLHDL, LDLDIRECT in the last 72 hours.  No results found  for: HGBA1C ------------------------------------------------------------------------------------------------------------------ No results for input(s): TSH, T4TOTAL, T3FREE, THYROIDAB in the last 72 hours.  Invalid input(s): FREET3 ------------------------------------------------------------------------------------------------------------------ No results for input(s): VITAMINB12, FOLATE, FERRITIN, TIBC, IRON, RETICCTPCT in the last 72 hours.  Coagulation profile No results for input(s): INR, PROTIME in the last 168 hours.  No results for input(s): DDIMER in the last 72 hours.  Cardiac Enzymes No results for input(s): CKMB, TROPONINI, MYOGLOBIN in the last 168 hours.  Invalid input(s): CK ------------------------------------------------------------------------------------------------------------------ No results found for: BNP  Inpatient Medications  Scheduled Meds: . ampicillin-sulbactam (UNASYN) IV  3 g Intravenous Q8H  . atorvastatin  20 mg Oral Daily  . chlorhexidine  15 mL Mouth Rinse BID  . enoxaparin (LOVENOX) injection  40 mg Subcutaneous Q24H  . feeding supplement (ENSURE ENLIVE)  237 mL Oral BID BM  . guaiFENesin  1,200 mg Oral BID  . insulin aspart  0-9 Units Subcutaneous TID WC  . ipratropium-albuterol  3 mL Inhalation QID  . mouth rinse  15 mL Mouth Rinse q12n4p  . metoprolol tartrate  100 mg Oral BID  . pantoprazole  40 mg Oral Daily  . predniSONE  20 mg Oral Q breakfast   Followed by  . [START ON 11/27/2016] predniSONE  10 mg Oral Q breakfast   Followed by  . [START ON 11/30/2016] predniSONE  5 mg Oral Q breakfast   Continuous Infusions: PRN Meds:.albuterol, fentaNYL (SUBLIMAZE) injection, Gerhardt's butt cream, labetalol,  ondansetron (ZOFRAN) IV, potassium chloride (KCL MULTIRUN) 30 mEq in 265 mL IVPB  Micro Results Recent Results (from the past 240 hour(s))  Culture, respiratory (NON-Expectorated)     Status: None   Collection Time: 11/15/16 12:32 PM  Result Value Ref Range Status   Specimen Description BRONCHIAL ALVEOLAR LAVAGE  Final   Special Requests NONE  Final   Gram Stain   Final    MODERATE WBC PRESENT, PREDOMINANTLY PMN RARE SQUAMOUS EPITHELIAL CELLS PRESENT RARE GRAM POSITIVE COCCI IN PAIRS RARE GRAM NEGATIVE COCCOBACILLI    Culture Consistent with normal respiratory flora.  Final   Report Status 11/17/2016 FINAL  Final  Aerobic/Anaerobic Culture (surgical/deep wound)     Status: None   Collection Time: 11/15/16  1:18 PM  Result Value Ref Range Status   Specimen Description TISSUE RIGHT PLEURAL  Final   Special Requests   Final    RIGHT PLEURAL PEEL PATIENT ON FOLLOWING  VANCOMYCIN   Gram Stain   Final    ABUNDANT WBC PRESENT, PREDOMINANTLY PMN NO ORGANISMS SEEN    Culture No growth aerobically or anaerobically.  Final   Report Status 11/20/2016 FINAL  Final  Culture, body fluid-bottle     Status: None   Collection Time: 11/15/16  1:47 PM  Result Value Ref Range Status   Specimen Description PLEURAL RIGHT  Final   Special Requests POF VANC  Final   Culture NO GROWTH 5 DAYS  Final   Report Status 11/20/2016 FINAL  Final  Gram stain     Status: None   Collection Time: 11/15/16  1:47 PM  Result Value Ref Range Status   Specimen Description PLEURAL RIGHT  Final   Special Requests NONE  Final   Gram Stain   Final    ABUNDANT WBC PRESENT, PREDOMINANTLY PMN NO ORGANISMS SEEN    Report Status 11/15/2016 FINAL  Final  Aerobic/Anaerobic Culture (surgical/deep wound)     Status: None   Collection Time: 11/15/16  2:06 PM  Result Value Ref Range Status   Specimen Description ABSCESS  RIGHT LOWER LUNG  Final   Special Requests PT ON VANC  Final   Gram Stain   Final    ABUNDANT WBC  PRESENT,BOTH PMN AND MONONUCLEAR NO ORGANISMS SEEN    Culture No growth aerobically or anaerobically.  Final   Report Status 11/20/2016 FINAL  Final    Radiology Reports Dg Chest 2 View  Result Date: 11/24/2016 CLINICAL DATA:  Recent right lower lobectomy for empyema. Currently with cough EXAM: CHEST  2 VIEW COMPARISON:  November 23, 2016 FINDINGS: There has been partial clearing of airspace consolidation from the lung bases compared to 1 day prior. There remains patchy bibasilar atelectasis with small pleural effusions bilaterally. Lungs elsewhere are clear. Note that there is volume loss on the right consistent with previous surgery. Heart size is normal. The pulmonary vascular is within normal limits. No adenopathy. No bone lesions. IMPRESSION: Bibasilar atelectasis with small pleural effusions. No consolidation. Stable volume loss on the right. No new opacity. Stable cardiac silhouette. No evident pneumothorax. Electronically Signed   By: Lowella Grip III M.D.   On: 11/24/2016 08:38   Dg Chest 2 View  Result Date: 11/05/2016 CLINICAL DATA:  Shortness of breath and cough. EXAM: CHEST  2 VIEW COMPARISON:  August 28, 2015 FINDINGS: The cardiomediastinal silhouette is stable. No pneumothorax. The left lung is clear. New effusion and underlying opacity seen on the right. IMPRESSION: New effusion and underlying opacity seen in the right lower lobe. No other interval changes. Recommend follow-up to resolution. Electronically Signed   By: Dorise Bullion III M.D   On: 11/05/2016 19:03   Ct Chest Wo Contrast  Result Date: 11/13/2016 CLINICAL DATA:  66 year old female with history of loculated pleural effusion with chest tube in place. EXAM: CT CHEST WITHOUT CONTRAST TECHNIQUE: Multidetector CT imaging of the chest was performed following the standard protocol without IV contrast. COMPARISON:  Chest CT 02/19/2015. Multiple recent prior chest radiographs. FINDINGS: Cardiovascular: Heart size is normal.  There is no significant pericardial fluid, thickening or pericardial calcification. There is aortic atherosclerosis, as well as atherosclerosis of the great vessels of the mediastinum and the coronary arteries, including calcified atherosclerotic plaque in the left main and left anterior descending coronary arteries. Left internal jugular central venous catheter with tip terminating in the superior aspect of the right atrium. Mediastinum/Nodes: Patient is intubated, with the tip of the endotracheal to the lying approximately 2.1 cm above the carina. Nasogastric tube extends into the stomach. Numerous borderline enlarged and mildly enlarged mediastinal and bilateral hilar lymph nodes, measuring up to 11 mm in short axis in the subcarinal nodal station. Esophagus is unremarkable in appearance. No axillary lymphadenopathy. Lungs/Pleura: Right-sided chest tube in position with tip directed into the medial aspect of the apex of the right hemithorax. Large complex right-sided pleural fluid and gas collection. Fluid ranges from low to intermediate attenuation, suggesting proteinaceous contents. Some of the pneumothorax component is confluent anteriorly, while other locules of gas are noted throughout the lower right hemithorax, some of which appear to be within collapsed/consolidated lung tissue, but other small locules of gas likely are present within loculated pleural fluid in the lower right hemithorax. In the medial aspect of the lower right hemithorax there is what appears to be a thick-walled cavitary area, which is favored to be within the lung parenchyma rather than in the pleural space, measuring up to 6.4 x 2.4 x 6.5 cm (axial image 83 of series 201, and coronal image 56 of series 203). Areas of bronchiectasis  are noted within the right lower lobe. Within the aerated portions of the lungs there is patchy multifocal ground-glass attenuation and septal thickening. The ground-glass attenuation is most evident in a  peribronchovascular distribution, likely infectious/inflammatory in etiology, most severe in the left upper lobe. No significant left pleural effusion. Upper Abdomen: Unremarkable. Musculoskeletal: Extensive emphysema is noted throughout the subcutaneous fat of the chest wall bilaterally extending up into the cervical regions bilaterally (right greater than left). There are no aggressive appearing lytic or blastic lesions noted in the visualized portions of the skeleton. IMPRESSION: 1. Findings are most compatible with right lower lobe necrotizing pneumonia, with what appears to be a large right lower lobe cavity in the medial aspect of the basal segments of the right lower lobe, as well as a large right empyema. In this complex right pleural gas and fluid collections there both a small anterior pneumothorax, as well as multifocal loculated components of gas within the inferior aspect of the right pleural space, as above. Right-sided chest tube is directed into the medial aspect of the right apex, and the tip of the chest tube is currently surrounded by very little pleural fluid or gas. 2. Multifocal patchy interstitial and airspace disease asymmetrically distributed throughout the remaining aerated portions of the lungs, favored to reflect severe multilobar bronchopneumonia. 3. Aortic atherosclerosis, in addition to left main and left anterior descending coronary artery disease. Please note that although the presence of coronary artery calcium documents the presence of coronary artery disease, the severity of this disease and any potential stenosis cannot be assessed on this non-gated CT examination. Assessment for potential risk factor modification, dietary therapy or pharmacologic therapy may be warranted, if clinically indicated. 4. Support apparatus, as above. Electronically Signed   By: Vinnie Langton M.D.   On: 11/13/2016 15:19   US Renal  Result Date: 11/06/2016 CLINICAL DATA:  Sepsis EXAM: RENAL /  URINARY TRACT ULTRASOUND COMPLETE COMPARISON:  None. FINDINGS: Right Kidney: Length: 9.4 cm, within normal limits. Echogenicity within normal limits. No mass or hydronephrosis visualized. Left Kidney: Length: 10.9 cm, within normal limits. Echogenicity within normal limits. No mass or hydronephrosis visualized. Bladder: Appears normal for degree of bladder distention. IMPRESSION: Negative bilateral renal ultrasound. Electronically Signed   By: San Morelle M.D.   On: 11/06/2016 12:41   Dg Chest Port 1 View  Result Date: 11/23/2016 CLINICAL DATA:  CHEST TUBE REMOVAL/RIGHT EXAM: PORTABLE CHEST 1 VIEW COMPARISON:  11/23/2016 FINDINGS: Right chest tube has been removed. No pneumothorax. Heart size is accentuated by the AP portable technique. There are patchy densities in the lung bases bilaterally which partially obscure the hemidiaphragms, increased on the left. Opacity at the left lung base now obscures the hemidiaphragm. No pulmonary edema. IMPRESSION: Bilateral lower lobe opacities, increased on the left. No pneumothorax following removal of chest tube. Electronically Signed   By: Nolon Nations M.D.   On: 11/23/2016 12:53   Dg Chest Port 1 View  Result Date: 11/23/2016 CLINICAL DATA:  Shortness of breath.  Pleural effusion . EXAM: PORTABLE CHEST 1 VIEW COMPARISON:  11/22/2016.  11/21/2016 . FINDINGS: Right chest tube in stable position. No pneumothorax. Stable cardiomegaly. Interim improvement of pulmonary interstitial prominence suggesting improvement of pulmonary interstitial edema. Tiny bilateral pleural effusions. Stable biapical pleural thickening most likely secondary scarring . Prominent skin folds noted over the chest. IMPRESSION: 1. Right chest tube in stable position.  No pneumothorax. 2. Stable cardiomegaly. Interim improvement of pulmonary interstitial prominence suggesting improving interstitial edema. Tiny bilateral  pleural effusions again noted. Electronically Signed   By: Marcello Moores   Register   On: 11/23/2016 07:34   Dg Chest Port 1 View  Result Date: 11/22/2016 CLINICAL DATA:  66 y/o  F; increasing shortness of breath. EXAM: PORTABLE CHEST 1 VIEW COMPARISON:  11/21/2016 chest radiograph. FINDINGS: Stable cardiac silhouette given projection and technique. Aortic atherosclerosis with calcification. Stable position of right-sided chest tube. Increase interstitial opacities in the lungs bilaterally probably represents developing interstitial pulmonary edema. Increasing opacity of the right lung base probably represents worsening pleural effusion and atelectasis. No acute osseous abnormality identified. IMPRESSION: Increase interstitial opacities probably representing developing edema. Increasing opacity of the right lung and right lung base, likely increasing pleural effusion. No appreciable pneumothorax. Electronically Signed   By: Kristine Garbe M.D.   On: 11/22/2016 05:29   Dg Chest Port 1 View  Result Date: 11/21/2016 CLINICAL DATA:  History of pneumothorax.  Chest tube removal. EXAM: PORTABLE CHEST 1 VIEW COMPARISON:  11/20/2016. FINDINGS: Interim extubation. Interim removal of the lateral most chest tube. Two medial most chest tube is in stable position. Left IJ line in stable position . Heart size stable. Right base subsegmental atelectasis. Perihilar atelectasis/infiltrate. Small bilateral pleural effusions. No pneumothorax. IMPRESSION: 1. Interim extubation. Interim removal of lateral most chest tube. Two medial most chest tubes in stable position. Left IJ line stable position. No pneumothorax. 2. Persistent right base subsegmental atelectasis. Mild left perihilar atelectasis/ infiltrate noted on today's exam. Small bilateral pleural effusions. Electronically Signed   By: Marcello Moores  Register   On: 11/21/2016 06:45   Dg Chest Port 1 View  Result Date: 11/20/2016 CLINICAL DATA:  Status post thoracoscopy and empyema drainage with decortication on the right. EXAM: PORTABLE  CHEST 1 VIEW COMPARISON:  Portable chest x-ray of November 19, 2016 FINDINGS: There remains mild volume loss on the right. There is no pneumothorax. The right-sided chest tube tip projects over the posterior aspect of the fourth rib. A second more medially positioned chest tube has its tip approximately 2 cm lateral to the hands. A third chest tube has its tip projecting over the medial aspect of the ninth rib. The left lung is well-expanded. Minimal basilar atelectasis is suspected on the left. The cardiac silhouette is top-normal in size. The pulmonary vascularity is not engorged. The endotracheal tube tip lies approximately 3.7 cm above the carina. The esophagogastric tube tip projects below the inferior margin of the image. The left internal jugular venous catheter tip projects over the distal third of the SVC. IMPRESSION: Fairly stable appearance of the chest. No right-sided pneumothorax or significant pleural effusion. Minimal postsurgical volume loss due to lower lobectomy. Minimal left basilar atelectasis. The support tubes are in reasonable position. Electronically Signed   By: David  Martinique M.D.   On: 11/20/2016 07:08   Dg Chest Port 1 View  Result Date: 11/19/2016 CLINICAL DATA:  Pneumonia EXAM: PORTABLE CHEST 1 VIEW COMPARISON:  11/18/2016 FINDINGS: Three chest tubes on the right unchanged. No pneumothorax. Minimal right effusion unchanged. Mild bibasilar airspace disease unchanged Endotracheal tube 2.5 cm above the carina. Left jugular central venous catheter tip in the right atrium. NG tube enters the stomach. IMPRESSION: No change from yesterday. Electronically Signed   By: Franchot Gallo M.D.   On: 11/19/2016 07:07   Dg Chest Port 1 View  Result Date: 11/18/2016 CLINICAL DATA:  Evaluate ETT. EXAM: PORTABLE CHEST 1 VIEW COMPARISON:  November 17, 2016 FINDINGS: The ETT is in good position. Right-sided chest tubes remain, in  good position. The enteric tube terminates below today's study but the side  port is below the GE junction within the stomach. No pneumothorax. The cardiomediastinal silhouette is stable. Small bilateral pleural effusions with underlying atelectasis, unchanged. A left-sided central line terminates near the caval atrial junction. It is possible it could extend into the right atrium. This appears to be unchanged in the interval. No overt edema. IMPRESSION: 1. Stable support apparatus. The distal tip of the right central line is near the caval atrial junction, either within the distal SVC or just within the right atrium. 2. Small bilateral effusions with underlying atelectasis remain. Electronically Signed   By: Dorise Bullion III M.D   On: 11/18/2016 07:26   Dg Chest Port 1 View  Result Date: 11/17/2016 CLINICAL DATA:  Chest 2.  Change in respirations and breath sounds EXAM: PORTABLE CHEST 1 VIEW COMPARISON:  11/17/2016 FINDINGS: Right chest tube, endotracheal tube, NG tube and left central line remain in place, unchanged. Bibasilar atelectasis is stable. No pneumothorax. Probable small effusions. IMPRESSION: No pneumothorax. Continued bibasilar atelectasis and small effusions. Electronically Signed   By: Rolm Baptise M.D.   On: 11/17/2016 12:57   Dg Chest Port 1 View  Result Date: 11/17/2016 CLINICAL DATA:  Respiratory failure EXAM: PORTABLE CHEST 1 VIEW COMPARISON:  11/16/2016 FINDINGS: Endotracheal tube, nasogastric catheter and left jugular central line are again seen and stable. Chest tubes are noted on the right and stable. No pneumothorax is seen. Bibasilar atelectatic changes are seen. No bony abnormality is noted. IMPRESSION: Tubes and lines as described.  No pneumothorax is noted. Stable bibasilar changes. Electronically Signed   By: Inez Catalina M.D.   On: 11/17/2016 07:32   Dg Chest Port 1 View  Result Date: 11/16/2016 CLINICAL DATA:  Acute respiratory failure EXAM: PORTABLE CHEST 1 VIEW COMPARISON:  11/15/2016 FINDINGS: Cardiac shadow remains mildly enlarged.  Endotracheal tube, nasogastric catheter and left jugular central line are again seen. Multiple right chest tubes are again noted and stable. No definitive pneumothorax is seen. Bibasilar infiltrative changes are noted right greater than left. The changes on the left has increased slightly in the interval from the prior exam. IMPRESSION: Postsurgical changes. No pneumothorax is seen. Bibasilar infiltrative changes are seen. Electronically Signed   By: Inez Catalina M.D.   On: 11/16/2016 07:34   Dg Chest Port 1 View  Result Date: 11/15/2016 CLINICAL DATA:  Right lower lobe lobectomy for pulmonary abscess/empyema EXAM: PORTABLE CHEST 1 VIEW COMPARISON:  11/15/2016 FINDINGS: The heart size and mediastinal contours are within normal limits. Endotracheal tube tip is slightly low lying at 2.4 cm above the carina. Pullback approximately 1 cm recommended. Left IJ central line catheter is noted at the cavoatrial junction. Two right-sided chest tubes are identified projecting over the lung apex with decrease in previously noted loculated pleural opacity along the periphery of the right hemithorax. Bibasilar atelectasis is identified. No significant pneumothorax. Gastric tube extends below the left hemidiaphragm into the expected location the stomach. The tip is excluded however. No acute osseous abnormality appear IMPRESSION: 1. Slightly low-lying endotracheal tube tip approximately 2.4 cm above the carina. Pullback approximately 1 cm recommended. Otherwise, satisfactory support line and tube positions. These results will be called to the ordering clinician or representative by the Radiologist Assistant, and communication documented in the PACS or zVision Dashboard. 2. Residual atelectasis at the right lung base without appreciable pneumothorax status post right lower lobectomy. Electronically Signed   By: Ashley Royalty M.D.   On:  11/15/2016 19:57   Dg Chest Port 1 View  Result Date: 11/15/2016 CLINICAL DATA:  Respiratory  failure. EXAM: PORTABLE CHEST 1 VIEW COMPARISON:  11/14/2016 CT 11/13/2016 . FINDINGS: Endotracheal tube, NG tube, left IJ line, right chest tube in stable position. No definite pneumothorax identified. Previously identified anterior pneumothorax best identified on CT of 11/13/2016. Persistent basilar infiltrates, particularly prominent on the right. Persistent prominent right sided pleural effusions/possible empyema again noted. No change. IMPRESSION: 1. Lines and tubes including right chest tube in stable position. No pneumothorax identified. Previously identified anterior pneumothorax best demonstrated by prior CT of 11/13/2016. 2. Persistent loculated right pleural effusion/empyema. No acute change. Persistent basilar infiltrates, particular prominent on the right. No change. Electronically Signed   By: Marcello Moores  Register   On: 11/15/2016 06:46   Dg Chest Port 1 View  Result Date: 11/14/2016 CLINICAL DATA:  Respiratory failure. EXAM: PORTABLE CHEST 1 VIEW COMPARISON:  CT 11/13/2016.  Chest x-ray 11/13/2016. FINDINGS: Endotracheal tube, NG tube, left IJ line, right chest tube stable position. Heart size stable. Bilateral pulmonary infiltrates with right side pleural effusion/ possible empyema again noted. Previously identified anterior pneumothorax is best demonstrated by CT of 11/13/2016. Mild chest wall subcutaneous emphysema again noted. IMPRESSION: 1. Lines and tubes including right chest tube in stable position. 2. Persistent bibasilar infiltrates. Persistent right sided prominent pleural effusion/ possible empyema again noted without significant change. Previously identified anterior pneumothorax best demonstrated about recent CT. No definite pneumothorax noted on this chest x-ray. Electronically Signed   By: Marcello Moores  Register   On: 11/14/2016 07:04   Dg Chest Port 1 View  Result Date: 11/13/2016 CLINICAL DATA:  Pneumothorax.  Shortness of breath . EXAM: PORTABLE CHEST 1 VIEW COMPARISON:  11/12/2016.  FINDINGS: Endotracheal tube, left IJ line, NG tube, right chest tube in stable position. No pneumothorax. Persistent right side pleural effusion with increase in size from prior exam. Right base atelectasis. Heart size normal. Subcutaneous emphysema has improved. IMPRESSION: 1. Lines and tubes is including right chest tube in stable position. No pneumothorax. 2.  Interim increase in right pleural effusion. Electronically Signed   By: Marcello Moores  Register   On: 11/13/2016 06:47   Dg Chest Port 1 View  Result Date: 11/12/2016 CLINICAL DATA:  Patient with history of pneumothorax. EXAM: PORTABLE CHEST 1 VIEW COMPARISON:  Chest radiograph 11/11/2016 FINDINGS: ET tube terminates in the mid trachea. Central venous catheter tip projects over the right atrium. Monitoring leads overlie the patient. Right-sided chest tube projects over the right lung apex. Stable cardiac and mediastinal contours. Persistent heterogeneous opacities right mid lower lung. Heterogeneous opacities left lung base. Moderate right pleural effusion. No definite pneumothorax. Subcutaneous emphysema. IMPRESSION: Stable support apparatus. Persistent moderate right pleural effusion with underlying opacities. No visible right-sided pneumothorax. Electronically Signed   By: Lovey Newcomer M.D.   On: 11/12/2016 08:40   Dg Chest Port 1 View  Result Date: 11/11/2016 CLINICAL DATA:  Continued surveillance pneumothorax. EXAM: PORTABLE CHEST 1 VIEW COMPARISON:  11/10/2016. FINDINGS: Unchanged cardiomediastinal silhouette and support apparatus. RIGHT chest tube remains in good position in the RIGHT lung apex. Visualized pneumothorax is less, although RIGHT pleural effusion is increased. BILATERAL pulmonary opacities appear worse. Subcutaneous emphysema redemonstrated. IMPRESSION: No visible pneumothorax.  Increasing RIGHT effusion. Electronically Signed   By: Staci Righter M.D.   On: 11/11/2016 07:51   Dg Chest Port 1 View  Result Date: 11/10/2016 CLINICAL  DATA:  Pneumothorax.  Chest tube. EXAM: PORTABLE CHEST 1 VIEW COMPARISON:  Chest x-ray  11/10/2016. FINDINGS: Endotracheal tube, left IJ line, NG tube, right chest tube in stable position. Heart size normal. Bilateral pulmonary infiltrates. Small right pneumothorax noted on today's exam. Diffuse chest wall subcutaneous emphysema again noted. IMPRESSION: 1. Lines and tubes including right chest tube in stable position. Small right pneumothorax noted on today's exam. Diffuse chest wall subcutaneous emphysema noted. 2. Diffuse bilateral pulmonary infiltrates. Critical Value/emergent results were called by telephone at the time of interpretation on 11/10/2016 at 3:17 pm to nurse Anderson Malta, who verbally acknowledged these results. Electronically Signed   By: Marcello Moores  Register   On: 11/10/2016 15:19   Dg Chest Port 1 View  Result Date: 11/10/2016 CLINICAL DATA:  Respiratory failure. EXAM: PORTABLE CHEST 1 VIEW COMPARISON:  11/09/2016. FINDINGS: Endotracheal tube, NG tube, left IJ line in stable position . Right chest tube in stable position. No pneumothorax. Diffuse bilateral chest wall and neck subcutaneous emphysema. Subcutaneous emphysema has increased significantly from prior exam. IMPRESSION: 1. Lines and tubes in stable position. Right chest tube in stable position. No pneumothorax. 2. Persistent bilateral pulmonary infiltrates, right greater than left. Small right pleural effusion. No interim change. 3. Interim significant progression of diffuse bilateral chest wall and bilateral neck subcutaneous emphysema. Electronically Signed   By: Marcello Moores  Register   On: 11/10/2016 07:13   Dg Chest Port 1 View  Result Date: 11/09/2016 CLINICAL DATA:  Post right chest tube placement EXAM: PORTABLE CHEST 1 VIEW COMPARISON:  11/09/2016 FINDINGS: Cardiomediastinal silhouette is stable. Persistent bilateral basilar infiltrates right greater than left. Small right pleural effusion. There is right chest tube in place with tip in  right apex. No definite pneumothorax. Subcutaneous emphysema noted right axilla. Stable endotracheal and NG tube position. Stable left IJ central line position. IMPRESSION: Stable support apparatus. Persistent bilateral basilar infiltrates right greater than left. Small right pleural effusion. There is right chest tube in place with tip in right apex. No definite pneumothorax. Subcutaneous emphysema noted right axilla. Electronically Signed   By: Lahoma Crocker M.D.   On: 11/09/2016 10:27   Dg Chest Port 1 View  Result Date: 11/09/2016 CLINICAL DATA:  Respiratory failure EXAM: PORTABLE CHEST 1 VIEW COMPARISON:  11/08/2016 FINDINGS: Endotracheal tube, nasogastric catheter and left jugular central line are again seen and stable. Cardiac shadow is stable. Bibasilar infiltrates and effusions are seen slightly worse on the right than the left. New left upper lobe infiltrative changes are noted. A new right-sided pneumothorax is noted with approximately 2 cm excursion at the apex at and 1 cm excursion laterally. IMPRESSION: New right pneumothorax. Bibasilar infiltrates as well as new left upper lobe infiltrate are seen. Effusions are seen right greater than left. Critical Value/emergent results were called by telephone at the time of interpretation on 11/09/2016 at 7:04 am to [facility], the pts nurse, who verbally acknowledged these results and will contact primary physician. Electronically Signed   By: Inez Catalina M.D.   On: 11/09/2016 07:05   Portable Chest Xray  Result Date: 11/08/2016 CLINICAL DATA:  Hypoxia EXAM: PORTABLE CHEST 1 VIEW COMPARISON:  November 07, 2016 FINDINGS: Endotracheal tube tip is 1.5 cm above the carina. Nasogastric tube tip and side port are below the diaphragm. Central catheter tip is at the cavoatrial junction. No pneumothorax. There is airspace consolidation in both lower lobes, not significantly changed from 1 day prior. There are small pleural effusions bilaterally. Heart size and pulmonary  vascularity are normal. There is atherosclerotic calcification in the aorta. No adenopathy. No bone lesions. IMPRESSION: Tube and  catheter positions as described without evident pneumothorax. Airspace opacity suspicious for pneumonia in both lower lobes. Small pleural effusions bilaterally. No interstitial edema appreciable. Heart size normal. There is aortic atherosclerosis. Electronically Signed   By: Lowella Grip III M.D.   On: 11/08/2016 07:41   Dg Chest Port 1 View  Result Date: 11/07/2016 CLINICAL DATA:  Central line placement. EXAM: PORTABLE CHEST 1 VIEW COMPARISON:  Earlier today at 1412 hours FINDINGS: 1527 hours. Left internal jugular line is difficult to follow centrally secondary to overlying wires and leads. Terminates at least at the level of the high right atrium. Endotracheal and nasogastric tubes are unchanged. Normal heart size. Atherosclerosis in the transverse aorta. Small left pleural effusion is unchanged. No pneumothorax. Bibasilar airspace disease is not significantly changed. Mild interstitial edema. IMPRESSION: Left internal jugular line is difficult to follow centrally secondary to overlying wires and leads. Followed to at least the level of the high right atrium. Consider retraction 2-3 cm with repeat imaging (ideally after removing EKG leads and wires). Otherwise, similar appearance of the chest with bibasilar airspace disease, left pleural fluid, and interstitial edema. Electronically Signed   By: Abigail Miyamoto M.D.   On: 11/07/2016 15:56   Dg Chest Port 1 View  Result Date: 11/07/2016 CLINICAL DATA:  Endotracheal tube EXAM: PORTABLE CHEST 1 VIEW COMPARISON:  03/09/2017 FINDINGS: Small bilateral pleural effusions. Right lower lobe airspace disease. No pneumothorax. Stable cardiomediastinal silhouette. Endotracheal to with the tip 3.5 cm above the carina. Nasogastric tube coursing below the diaphragm. IMPRESSION: 1. Small bilateral pleural effusions. Right lower lobe airspace  disease concerning for pneumonia. 2. Small bilateral pleural effusions. Electronically Signed   By: Kathreen Devoid   On: 11/07/2016 14:29   Dg Chest Port 1 View  Result Date: 11/07/2016 CLINICAL DATA:  Pleural effusion EXAM: PORTABLE CHEST 1 VIEW COMPARISON:  Chest radiograph 11/05/2016 FINDINGS: Unchanged cardiomediastinal contours. Small right pleural effusion and associated basilar consolidation is unchanged. The left lung is clear. IMPRESSION: Unchanged small right pleural effusion and right basilar consolidation. Electronically Signed   By: Ulyses Jarred M.D.   On: 11/07/2016 02:43   Dg Abd Portable 1v  Result Date: 11/07/2016 CLINICAL DATA:  Enteric tube placement EXAM: PORTABLE ABDOMEN - 1 VIEW COMPARISON:  None. FINDINGS: Enteric tube terminates in the distal body of the stomach. No disproportionately dilated small bowel loops. Mild colonic stool volume. No evidence of pneumatosis or pneumoperitoneum. Patchy bibasilar lung opacities. IMPRESSION: 1. Enteric tube terminates in the distal body of the stomach. 2. Nonobstructive bowel gas pattern. 3. Nonspecific patchy bibasilar lung opacities, correlate with chest radiograph. Electronically Signed   By: Ilona Sorrel M.D.   On: 11/07/2016 14:29   Ir US Chest  Result Date: 11/06/2016 CLINICAL DATA:  Patient admitted with right-sided pneumonia. Recent imaging suggested possible right-sided pleural effusion. Request is made for thoracentesis. EXAM: CHEST ULTRASOUND COMPARISON:  None. FINDINGS: Minimal right-sided effusion. Most of what is seen is consolidation of the right lung secondary to pneumonia. IMPRESSION: Unable to perform thoracentesis secondary to minimal effusion. Read by: Saverio Danker, PA-C Electronically Signed   By: Aletta Edouard M.D.   On: 11/06/2016 11:57    Time Spent in minutes  30 critical care    Jani Gravel M.D on 11/25/2016 at 7:02 AM  Between 7am to 7pm - Pager - [phone]  After 7pm go to [email or web address] - password  [facility]  [facility] -  Office  [phone]

## 2016-11-25 NOTE — Progress Notes (Signed)
   [facility] PCCM  Pt noted to be doing OK until this pm. Pt had empyema, R lower lung, S/P VATS/Decortication, POD #9. Her CT were all eventually removed.  She was on 2L [place] this am.  She was sitting on chair, moving around.  Woke up this pm and was noted to be SOB.  CXR with partial collapse of R upper lung with ipsilateral deviation, mucus plug +/- new infiltrate.   Pt seen, in mild distress. On NRBM.  BP and HR OK. sats 100% on NRBM. Sone SOB but better. (-) cp. Some dry cough.  Dec BS on R lung with rhonchi in R lung.   Assessment : Mucus plug/atelectasis S/P VATS/decortication for empyema   Plan : Vest cpt q4 with mucomyst q4 with duoneb q4 with pulmicort q12.  Cont abx for now. Vanc and cefepime.  Keep in SDU for now.  WOF worsening resp distress.  Check CXR in am.  Plan d/w pt and RN and RT.    Monica Becton, MD 11/25/2016, 6:17 PM [facility] Pager [phone] After 3 pm or if no answer, call [phone]

## 2016-11-25 NOTE — Progress Notes (Signed)
Pharmacy Antibiotic Note  Diana Gardner is a 66 y.o. female admitted on 11/05/2016 with pneumonia.  Pharmacy has been consulted for vancomycin and cefepime dosing.  Plan: Vancomycin 1g IV x1, then 744m IV q12h Cefepime 1g IV q8h Follow clinical progression, renal function, level PRN  Height: _0  (162.6 cm) Weight: 141 lb 8.6 oz (64.2 kg) IBW/kg (Calculated) : 54.7  Temp (24hrs), Avg:98.4 F (36.9 C), Min:97.9 F (36.6 C), Max:99 F (37.2 C)   Recent Labs Lab 11/20/16 0532  11/21/16 0410 11/22/16 0340 11/23/16 0249 11/24/16 0212 11/25/16 0318  WBC 6.9  --  5.9 6.5  --  6.2 6.0  CREATININE 0.67  < > 0.59 0.61 0.73 0.61 0.54  < > = values in this interval not displayed.  Estimated Creatinine Clearance: 58.9 mL/min (by C-G formula based on SCr of 0.54 mg/dL).    Allergies  Allergen Reactions  . No Known Allergies     Antimicrobials this admission: Cefepime 4/7>> Vanc 3/18 x1; 4/7>> Azithro 3/18 >> 3/19 CTX 3/18 >> 3/21 Unasyn 3/21 >>4/7  Microbiology results: Strep urine + 3/18 mrsa pcr: neg 3/18 urine cx: mult species 3/20 resp cx: neg 3/21 RVP: neg 3/21 blood cx: neg 3/28 BAL: neg 3/28 lung abscess: neg 4/7 Cdiff: neg  Thank you for allowing pharmacy to be a part of this patient's care.   D. , PharmD, BCPS Clinical Pharmacist Pager: [phone]/02/2017 6:09 PM

## 2016-11-25 NOTE — Progress Notes (Signed)
Progress Note    Diana Gardner  VQM:[id number] DOB: 07-13-1951  DOA: 11/05/2016 PCP: [name], MD    Brief Narrative:   Chief complaint: Pneumonia, COPD, pleural effusion.  Medical records reviewed and are as summarized below:  Diana Gardner is an 66 y.o. female with a PMH of COPD, chronic respiratory failure on home oxygen who was admitted 11/05/16 for a right lower lobe necrotizing community-acquired pneumonia complicated by pneumothorax and empyema status post right VATS and right lower lobectomy on 11/15/16. She was intubated from 11/07/16 ---> 11/20/16 and under the care of the critical care team. She also required pressor support, which was weaned 11/09/16. Care transferred to [facility] 11/22/16.  Assessment/Plan:   Principal Problem:   Sepsis (HCC)/Septic shock secondary to necrotizing right lower lobe pneumonia/empyema Admitted and placed on empiric Rocephin/azithromycin. Antibiotics broadened to vancomycin and Unasyn on 11/08/16. Pressors were weaned 11/09/16. Chest tube placed 11/09/16 secondary to right pneumothorax. Subsequently underwent a right VATS and right mini thoracotomy with drainage of empyema, decortication and right lower lobectomy on 11/15/16.  Sputum cultures on admission were negative. Blood cultures were negative. Respiratory virus panel was negative. Urinary strep antigen was positive.  Remains on Unasyn. Critical care team recommended a 2-4 week course of Unasyn.The patient continues to improve and is now off BiPAP 72 hours.  Active Problems:   Chronic obstructive pulmonary disease (HCC) with acute exacerbation Initially placed on IV steroids which have been weaned to oral prednisone with taper. Continue bronchodilators and flutter valve.    Acute on chronic hypoxemic respiratory failure (HCC) complicated by primary spontaneous right pneumothorax requiring chest tube placement, status post right lower lobectomy Intubated on admission. Chest tube placed  11/09/16 secondary to right pneumothorax. Underwent a right VATS and right mini thoracotomy with drainage of empyema, decortication and right lower lobectomy on 11/15/16. Extubated 11/20/16 to nasal cannula. Anterior chest tube discontinued 11/20/16. Continue nebs. Respiratory status slowly improving. Chest x-ray personally reviewed and shows bibasilar atelectasis with small pleural effusions. When compared to the film done 11/24/16, it appears that the right base has increased atelectasis.     Acute renal failure (ARF) (HCC) Secondary to hypoperfusion in the setting of NSAID and ACE inhibitor use. Resolved.    Hyponatremia  Resolved.    Normocytic anemia Hemoglobin stable at 8.6 this morning.    Hypokalemia/Hyperkalemia Potassium currently within normal limits.    Hyperlipidemia Continue Lipitor.    Hypertension Continue metoprolol and when necessary labetalol.    Steroid induced hyperglycemia CBGs 106-173. Continue insulin sensitive SSI.    Thrombocytosis Likely an acute phase reactant from severe infection.   Family Communication/Anticipated D/C date and plan/Code Status   DVT prophylaxis: Lovenox ordered. Code Status: Full Code.  Family Communication: Daughter at the bedside. Disposition Plan: Home versus SNF depending on progress.   Medical Consultants:    Pulmonology  CVTS    Procedures:   2-D echo 11/08/16  Study Conclusions  - Left ventricle: The cavity size was normal. Systolic function was   vigorous. The estimated ejection fraction was in the range of 65%   to 70%. Wall motion was normal; there were no regional wall   motion abnormalities. Doppler parameters are consistent with   abnormal left ventricular relaxation (grade 1 diastolic   dysfunction). LV mid cavity gradient, peak 22 mmHg. - Aortic valve: There was no stenosis. - Mitral valve: There was no significant regurgitation. - Right ventricle: The cavity size was normal. Systolic function  was  normal. - Pulmonary arteries: No complete TR doppler jet so unable to   estimate PA systolic pressure. - Inferior vena cava: The vessel was normal in size. The   respirophasic diameter changes were in the normal range (>= 50%),   consistent with normal central venous pressure.  11/15/16 PROCEDURE:  BRONCHOSCOPY RIGHT VATS/ MINI THORRACOTOMY DRAINAGE OF EMPYEMA AND DECORTICATION RIGHT LOWER LOBECTOMY  Anti-Infectives:    Vancomycin 11/05/16---> 11/06/16, 11/15/16---> 11/16/16  Unasyn 11/08/16--->  Rocephin 11/05/16---> 11/08/16  Azithromycin 11/05/16---> 11/07/16  Subjective:   Patient is slowly getting stronger. Sitting up today. Still coughing, nonproductive. Still with some shortness of breath. Had pain at the chest tube/VATS site last night requiring pain medication, but feels better now.  Objective:    Vitals:   11/25/16 0300 11/25/16 0330 11/25/16 0400 11/25/16 0816  BP:  (!) 162/68  (!) 146/88  Pulse: (!) 54 (!) 59 (!) 54 62  Resp: 15 19 (!) 21 (!) 23  Temp:  98 F (36.7 C)  98.4 F (36.9 C)  TempSrc:  Oral  Oral  SpO2: 98% 100% 100% 100%  Weight:      Height:        Intake/Output Summary (Last 24 hours) at 11/25/16 0846 Last data filed at 11/25/16 0819  Gross per 24 hour  Intake              240 ml  Output              353 ml  Net             -113 ml   Filed Weights   11/21/16 0400 11/22/16 0443 11/22/16 0600  Weight: 67.7 kg (149 lb 4 oz) 68.4 kg (150 lb 12.7 oz) 64.2 kg (141 lb 8.6 oz)    Exam: General exam: Awake and alert.Weak. Respiratory system: Decreased in the right base, no rhonchi today. Cardiovascular system: S1 & S2 heard, RRR. No JVD,  rubs, gallops or clicks. No murmurs. Gastrointestinal system: Abdomen is nondistended, soft and nontender. No organomegaly or masses felt. Normal bowel sounds heard. Central nervous system: Alert and oriented. No focal neurological deficits. Extremities: No clubbing,  or cyanosis. No edema. Skin: Foam dressing  to sacrum, right surgical site without signs of infection. Psychiatry: Judgement and insight appear normal. Mood & affect appropriate.   Data Reviewed:   I have personally reviewed following labs and imaging studies:  Labs: Basic Metabolic Panel:  Recent Labs Lab 11/19/16 0400 11/20/16 0532  11/21/16 0410 11/22/16 0340 11/23/16 0249 11/24/16 0212 11/25/16 0318  NA 144 143  < > 141 140 137 135 137  K 3.4* 3.2*  < > 3.0* 4.6 4.1 3.6 4.0  CL 101 95*  < > 99* 102 97* 95* 98*  CO2 37* 41*  < > 38* 34* 34* 32 29  GLUCOSE 124* 99  < > 150* 97 162* 122* 109*  BUN 32* 31*  < > 27* 22* 23* 20 19  CREATININE 0.64 0.67  < > 0.59 0.61 0.73 0.61 0.54  CALCIUM 8.1* 8.1*  < > 8.0* 8.0* 7.8* 7.9* 8.0*  MG 2.3 2.0  --  1.9 2.0  --   --   --   PHOS 3.7 4.0  --  3.6 2.8  --   --   --   < > = values in this interval not displayed. GFR Estimated Creatinine Clearance: 58.9 mL/min (by C-G formula based on SCr of 0.54 mg/dL). Liver Function Tests: No  results for input(s): AST, ALT, ALKPHOS, BILITOT, PROT, ALBUMIN in the last 168 hours. No results for input(s): LIPASE, AMYLASE in the last 168 hours. No results for input(s): AMMONIA in the last 168 hours. Coagulation profile No results for input(s): INR, PROTIME in the last 168 hours.  CBC:  Recent Labs Lab 11/19/16 0400 11/20/16 0532 11/21/16 0410 11/22/16 0340 11/24/16 0212 11/25/16 0318  WBC 7.5 6.9 5.9 6.5 6.2 6.0  NEUTROABS 5.7 4.8 3.9  --   --   --   HGB 7.6* 7.7* 8.0* 8.3* 8.4* 8.6*  HCT 24.1* 24.8* 26.2* 27.4* 26.9* 27.7*  MCV 89.3 88.6 89.4 90.7 87.9 88.8  PLT 406* 482* 541* 545* 514* 499*   Cardiac Enzymes: No results for input(s): CKTOTAL, CKMB, CKMBINDEX, TROPONINI in the last 168 hours. BNP (last 3 results) No results for input(s): PROBNP in the last 8760 hours. CBG:  Recent Labs Lab 11/24/16 0851 11/24/16 1153 11/24/16 1638 11/24/16 2134 11/25/16 0815  GLUCAP 106* 158* 173* 149* 113*   Sepsis  Labs:  Microbiology Recent Results (from the past 240 hour(s))  Culture, respiratory (NON-Expectorated)     Status: None   Collection Time: 11/15/16 12:32 PM  Result Value Ref Range Status   Specimen Description BRONCHIAL ALVEOLAR LAVAGE  Final   Special Requests NONE  Final   Gram Stain   Final    MODERATE WBC PRESENT, PREDOMINANTLY PMN RARE SQUAMOUS EPITHELIAL CELLS PRESENT RARE GRAM POSITIVE COCCI IN PAIRS RARE GRAM NEGATIVE COCCOBACILLI    Culture Consistent with normal respiratory flora.  Final   Report Status 11/17/2016 FINAL  Final  Aerobic/Anaerobic Culture (surgical/deep wound)     Status: None   Collection Time: 11/15/16  1:18 PM  Result Value Ref Range Status   Specimen Description TISSUE RIGHT PLEURAL  Final   Special Requests   Final    RIGHT PLEURAL PEEL PATIENT ON FOLLOWING  VANCOMYCIN   Gram Stain   Final    ABUNDANT WBC PRESENT, PREDOMINANTLY PMN NO ORGANISMS SEEN    Culture No growth aerobically or anaerobically.  Final   Report Status 11/20/2016 FINAL  Final  Culture, body fluid-bottle     Status: None   Collection Time: 11/15/16  1:47 PM  Result Value Ref Range Status   Specimen Description PLEURAL RIGHT  Final   Special Requests POF VANC  Final   Culture NO GROWTH 5 DAYS  Final   Report Status 11/20/2016 FINAL  Final  Gram stain     Status: None   Collection Time: 11/15/16  1:47 PM  Result Value Ref Range Status   Specimen Description PLEURAL RIGHT  Final   Special Requests NONE  Final   Gram Stain   Final    ABUNDANT WBC PRESENT, PREDOMINANTLY PMN NO ORGANISMS SEEN    Report Status 11/15/2016 FINAL  Final  Aerobic/Anaerobic Culture (surgical/deep wound)     Status: None   Collection Time: 11/15/16  2:06 PM  Result Value Ref Range Status   Specimen Description ABSCESS RIGHT LOWER LUNG  Final   Special Requests PT ON VANC  Final   Gram Stain   Final    ABUNDANT WBC PRESENT,BOTH PMN AND MONONUCLEAR NO ORGANISMS SEEN    Culture No growth  aerobically or anaerobically.  Final   Report Status 11/20/2016 FINAL  Final    Radiology: Dg Chest 2 View  Result Date: 11/24/2016 CLINICAL DATA:  Recent right lower lobectomy for empyema. Currently with cough EXAM: CHEST  2 VIEW COMPARISON:  November 23, 2016 FINDINGS: There has been partial clearing of airspace consolidation from the lung bases compared to 1 day prior. There remains patchy bibasilar atelectasis with small pleural effusions bilaterally. Lungs elsewhere are clear. Note that there is volume loss on the right consistent with previous surgery. Heart size is normal. The pulmonary vascular is within normal limits. No adenopathy. No bone lesions. IMPRESSION: Bibasilar atelectasis with small pleural effusions. No consolidation. Stable volume loss on the right. No new opacity. Stable cardiac silhouette. No evident pneumothorax. Electronically Signed   By: Lowella Grip III M.D.   On: 11/24/2016 08:38   Dg Chest Port 1 View  Result Date: 11/25/2016 CLINICAL DATA:  Respiratory failure EXAM: PORTABLE CHEST 1 VIEW COMPARISON:  Yesterday FINDINGS: Streaky densities at the bases, right more than left. Asymmetric elevation of the right diaphragm, increased. There is right pleural thickening reaching the apex. Borderline cardiomegaly, accentuated by technique. Small left effusions based on lateral film yesterday. No Kerley lines or pneumothorax. IMPRESSION: 1. Mildly increased atelectasis at the right base. 2. Right pleural thickening and lower lobectomy. Electronically Signed   By: Monte Fantasia M.D.   On: 11/25/2016 08:32   Dg Chest Port 1 View  Result Date: 11/23/2016 CLINICAL DATA:  CHEST TUBE REMOVAL/RIGHT EXAM: PORTABLE CHEST 1 VIEW COMPARISON:  11/23/2016 FINDINGS: Right chest tube has been removed. No pneumothorax. Heart size is accentuated by the AP portable technique. There are patchy densities in the lung bases bilaterally which partially obscure the hemidiaphragms, increased on the left.  Opacity at the left lung base now obscures the hemidiaphragm. No pulmonary edema. IMPRESSION: Bilateral lower lobe opacities, increased on the left. No pneumothorax following removal of chest tube. Electronically Signed   By: Nolon Nations M.D.   On: 11/23/2016 12:53    Medications:   . ampicillin-sulbactam (UNASYN) IV  3 g Intravenous Q8H  . atorvastatin  20 mg Oral Daily  . chlorhexidine  15 mL Mouth Rinse BID  . enoxaparin (LOVENOX) injection  40 mg Subcutaneous Q24H  . feeding supplement (ENSURE ENLIVE)  237 mL Oral BID BM  . guaiFENesin  1,200 mg Oral BID  . insulin aspart  0-9 Units Subcutaneous TID WC  . ipratropium-albuterol  3 mL Inhalation QID  . mouth rinse  15 mL Mouth Rinse q12n4p  . metoprolol tartrate  100 mg Oral BID  . pantoprazole  40 mg Oral Daily  . predniSONE  20 mg Oral Q breakfast   Followed by  . [START ON 11/27/2016] predniSONE  10 mg Oral Q breakfast   Followed by  . [START ON 11/30/2016] predniSONE  5 mg Oral Q breakfast   Continuous Infusions:  Medical decision making is of high complexity and this patient is at high risk of deterioration, therefore this is a level 3 visit.  (> 4 problem points, >4 data points, Moderate risk:)    LOS: 20 days   RAMA,CHRISTINA  [facility] Pager [phone]. If unable to reach me by pager, please call my cell phone at [phone].  *Please refer to [email or web address], password [email or web address] to get updated schedule on who will round on this patient, as hospitalists switch teams weekly. If 7PM-7AM, please contact night-coverage at [email or web address], password [email or web address] for any overnight needs.  11/25/2016, 8:46 AM

## 2016-11-26 ENCOUNTER — Inpatient Hospital Stay (HOSPITAL_COMMUNITY): Payer: Medicare Other

## 2016-11-26 DIAGNOSIS — J90 Pleural effusion, not elsewhere classified: Secondary | ICD-10-CM

## 2016-11-26 LAB — CBC
HEMATOCRIT: 30.1 % — AB (ref 36.0–46.0)
HEMOGLOBIN: 9.3 g/dL — AB (ref 12.0–15.0)
MCH: 27.6 pg (ref 26.0–34.0)
MCHC: 30.9 g/dL (ref 30.0–36.0)
MCV: 89.3 fL (ref 78.0–100.0)
Platelets: 565 10*3/uL — ABNORMAL HIGH (ref 150–400)
RBC: 3.37 MIL/uL — ABNORMAL LOW (ref 3.87–5.11)
RDW: 17 % — ABNORMAL HIGH (ref 11.5–15.5)
WBC: 7.2 10*3/uL (ref 4.0–10.5)

## 2016-11-26 LAB — COMPREHENSIVE METABOLIC PANEL
ALBUMIN: 1.9 g/dL — AB (ref 3.5–5.0)
ALK PHOS: 84 U/L (ref 38–126)
ALT: 28 U/L (ref 14–54)
ANION GAP: 6 (ref 5–15)
AST: 18 U/L (ref 15–41)
BILIRUBIN TOTAL: 0.3 mg/dL (ref 0.3–1.2)
BUN: 13 mg/dL (ref 6–20)
CALCIUM: 8.2 mg/dL — AB (ref 8.9–10.3)
CO2: 32 mmol/L (ref 22–32)
Chloride: 99 mmol/L — ABNORMAL LOW (ref 101–111)
Creatinine, Ser: 0.51 mg/dL (ref 0.44–1.00)
GFR calc non Af Amer: >60 mL/min (ref >60)
GLUCOSE: 117 mg/dL — AB (ref 65–99)
Potassium: 3.9 mmol/L (ref 3.5–5.1)
Sodium: 137 mmol/L (ref 135–145)
TOTAL PROTEIN: 5.6 g/dL — AB (ref 6.5–8.1)

## 2016-11-26 LAB — GLUCOSE, CAPILLARY
GLUCOSE-CAPILLARY: 190 mg/dL — AB (ref 65–99)
Glucose-Capillary: 116 mg/dL — ABNORMAL HIGH (ref 65–99)
Glucose-Capillary: 145 mg/dL — ABNORMAL HIGH (ref 65–99)
Glucose-Capillary: 160 mg/dL — ABNORMAL HIGH (ref 65–99)

## 2016-11-26 MED ORDER — METOPROLOL TARTRATE 25 MG PO TABS
25.0000 mg | ORAL_TABLET | Freq: Two times a day (BID) | ORAL | Status: DC
  Administered 2016-11-27 – 2016-12-04 (×14): 25 mg via ORAL
  Filled 2016-11-26 (×15): qty 1

## 2016-11-26 NOTE — Progress Notes (Signed)
Dr. Maudie Mercury notified of port CXR results from this AM; he is calling CCM/Pulmonary service himself.  Pt sats 98% on O2@4L /[place]; RR 24; labored respirations noted.  Continue to monitor closely.  Await plan from Pulmonary.

## 2016-11-26 NOTE — Progress Notes (Signed)
Patient ID: Diana Gardner, female   DOB: 11-17-1950, 66 y.o.   MRN: [id number]                                                                PROGRESS NOTE                                                                                                                                                                                                             Patient Demographics:    Diana Gardner, is a 66 y.o. female, DOB - Dec 08, 1950, ERX:[id number]  Admit date - 11/05/2016   Admitting Physician Edwin Dada, MD  Outpatient Primary MD for the patient is [name], MD  LOS - 21  Outpatient Specialists:     Chief Complaint  Patient presents with  . Shortness of Breath       Brief Narrative   66 y.o.femalewith a PMH of COPD, chronic respiratory failure on home oxygen who was admitted 11/05/16 for a right lower lobe necrotizing community-acquired pneumonia complicated by pneumothorax and empyema status post right facets and right lower lobectomy on 11/15/16. She was intubated from 11/07/16 arrow forward 11/20/16 and under the care of the critical care team. She also required pressor support, which was weaned 11/09/16. Care transferred    Subjective:    Diana Gardner yesterday pox dropped to 78%, no breath sounds on right side,   Placed on 100% venti mask,  Theory was ? Mucous plug.  CXR=> pneumonia? And small right pleural effusion suspected.  This Am, pt breathing better than yesterday. Slight dry cough.  Denies fever, chills, cp, palp, n/v, diarrhea, brbpr.    Repeat CXR this AM 4/8 => right lung complete opacification   Assessment  & Plan :    Principal Problem:   Sepsis ([place]) Active Problems:   Chronic obstructive pulmonary disease (HCC)   Acute hypoxemic respiratory failure (HCC)   Acute renal failure (ARF) (HCC)   Hyponatremia   Hyperkalemia   Chest tube in place   Primary spontaneous pneumothorax   S/P lobectomy of lung   Empyema (HCC)   Steroid-induced  hyperglycemia   Thrombocytosis (HCC)   Normocytic anemia    R lung Opacification on repeat CXR.  Check CT chest to delineate if this is a pleural effusion, loculated  vs free flowing PCCM notified of result Cont chest PT, and duoneb and NAC  Acute respiratory failure secondary to hcap, vs large right pleural effusion vs mucous plug Cont vanco D#2, cefepime D#2 (start date 4/7)  Sepsis/septic shock secondary to necrotizing right lower lung pneumonia/empyema (admission 3/18) Vanco/Rocephin/zithromax 3/18=>3/21 Vanco 3/28=>3/29 Unasyn 3/21=>4/7 Chest tube 3/22 secondary to right pneumothorax R VATS and right minithoracotomy with drainage of empyema , decortication , right lower lobectomy 3/28 Intubated 3/28, Extubated 4/2 Sputum 3/18 negative Blood cx 3/18 negative Respiratory virus panel negative Urine strep antigen positive 2-4 week course of unasyn recommended by critical care team but due to Acute respiratory failure 4/7 Vanco /cefepime 4/7=>  Copd w exacerbation Currently on prednisone taper, Duoneb, Pulmicort, Nacetylcysteine  Acute renal failure secondary to nsaid and ace inhibitor resolved  Hyponatremia resolved  Normocytic anemia , stable Repeat cbc in am  Hyperlipidemia Cont lipitor  Hypertension Cont metoprolol  Steroid induced hyperglycemia Cont ssi  Thrombocytosis stable ? From infection   Code Status : Full code  Family Communication  : w patient, no family at bedside  Disposition Plan  :  Home eventually  Barriers For Discharge :   Consults  :  pccm, CT surgery  Procedures  :  Chest tube 3/22 secondary to right pneumothorax  2-D echo 11/08/16  Study Conclusions  - Left ventricle: The cavity size was normal. Systolic function was vigorous. The estimated ejection fraction was in the range of 65% to 70%. Wall motion was normal; there were no regional wall motion abnormalities. Doppler parameters are consistent with abnormal left  ventricular relaxation (grade 1 diastolic dysfunction). LV mid cavity gradient, peak 22 mmHg. - Aortic valve: There was no stenosis. - Mitral valve: There was no significant regurgitation. - Right ventricle: The cavity size was normal. Systolic function was normal. - Pulmonary arteries: No complete TR doppler jet so unable to estimate PA systolic pressure. - Inferior vena cava: The vessel was normal in size. The respirophasic diameter changes were in the normal range (>= 50%), consistent with normal central venous pressure.  11/15/16 PROCEDURE:  BRONCHOSCOPY RIGHT VATS/ MINI THORRACOTOMY DRAINAGE OF EMPYEMA AND DECORTICATION RIGHT LOWER LOBECTOMY  DVT Prophylaxis  :  Lovenox -  SCDs   Lab Results  Component Value Date   PLT 565 (H) 11/26/2016    Antibiotics  :   Vanco/Rocephin/zithromax 3/18=>3/21 Vanco 3/28=>3/29 Unasyn 3/21=>4/7 Vanco/cefepime 4/7=>  Anti-infectives    Start     Dose/Rate Route Frequency Ordered Stop   11/26/16 0600  vancomycin (VANCOCIN) IVPB 750 mg/150 ml premix     750 mg 150 mL/hr over 60 Minutes Intravenous Every 12 hours 11/25/16 1810     11/25/16 1830  vancomycin (VANCOCIN) IVPB 1000 mg/200 mL premix     1,000 mg 200 mL/hr over 60 Minutes Intravenous  Once 11/25/16 1810 11/25/16 2100   11/25/16 1830  ceFEPIme (MAXIPIME) 1 g in dextrose 5 % 50 mL IVPB     1 g 100 mL/hr over 30 Minutes Intravenous Every 8 hours 11/25/16 1810     11/16/16 0000  vancomycin (VANCOCIN) IVPB 1000 mg/200 mL premix     1,000 mg 200 mL/hr over 60 Minutes Intravenous Every 12 hours 11/15/16 1711 11/16/16 0100   11/15/16 0715  vancomycin (VANCOCIN) IVPB 1000 mg/200 mL premix     1,000 mg 200 mL/hr over 60 Minutes Intravenous On call to O.R. 11/15/16 0706 11/15/16 1315   11/10/16 0142  Ampicillin-Sulbactam (UNASYN) 3 g in sodium chloride 0.9 %  100 mL IVPB  Status:  Discontinued     3 g 200 mL/hr over 30 Minutes Intravenous Every 8 hours 11/09/16 1809 11/25/16  1758   11/08/16 1700  Ampicillin-Sulbactam (UNASYN) 3 g in sodium chloride 0.9 % 100 mL IVPB  Status:  Discontinued     3 g 200 mL/hr over 30 Minutes Intravenous Every 12 hours 11/08/16 1632 11/09/16 1809   11/07/16 2100  vancomycin (VANCOCIN) IVPB 1000 mg/200 mL premix  Status:  Discontinued     1,000 mg 200 mL/hr over 60 Minutes Intravenous Every 48 hours 11/05/16 2144 11/06/16 0347   11/06/16 2100  cefTRIAXone (ROCEPHIN) 1 g in dextrose 5 % 50 mL IVPB  Status:  Discontinued     1 g 100 mL/hr over 30 Minutes Intravenous Every 24 hours 11/05/16 2326 11/08/16 1609   11/06/16 2100  azithromycin (ZITHROMAX) tablet 500 mg  Status:  Discontinued     500 mg Oral Every 24 hours 11/05/16 2326 11/07/16 1452   11/05/16 2200  azithromycin (ZITHROMAX) 500 mg in dextrose 5 % 250 mL IVPB     500 mg 250 mL/hr over 60 Minutes Intravenous  Once 11/05/16 2154 11/05/16 2315   11/05/16 1945  vancomycin (VANCOCIN) IVPB 1000 mg/200 mL premix     1,000 mg 200 mL/hr over 60 Minutes Intravenous  Once 11/05/16 1944 11/05/16 2209   11/05/16 1930  cefTRIAXone (ROCEPHIN) 1 g in dextrose 5 % 50 mL IVPB     1 g 100 mL/hr over 30 Minutes Intravenous  Once 11/05/16 1926 11/05/16 2023   11/05/16 1930  azithromycin (ZITHROMAX) 500 mg in dextrose 5 % 250 mL IVPB  Status:  Discontinued     500 mg 250 mL/hr over 60 Minutes Intravenous  Once 11/05/16 1926 11/05/16 2014        Objective:   Vitals:   11/26/16 0017 11/26/16 0300 11/26/16 0355 11/26/16 0747  BP:  (!) 170/71  (!) 151/65  Pulse:  72  67  Resp:  (!) 27  20  Temp:  98.5 F (36.9 C)  97.7 F (36.5 C)  TempSrc:  Oral  Oral  SpO2: 98% 94% 95% 99%  Weight:      Height:        Wt Readings from Last 3 Encounters:  11/22/16 64.2 kg (141 lb 8.6 oz)  01/31/16 68.5 kg (151 lb)  11/23/15 69.4 kg (153 lb)     Intake/Output Summary (Last 24 hours) at 11/26/16 0752 Last data filed at 11/26/16 0506  Gross per 24 hour  Intake             1050 ml  Output               577 ml  Net              473 ml     Physical Exam  Awake Alert, Oriented X 3, No new F.N deficits, Normal affect [place].AT,PERRAL Supple Neck,No JVD, No cervical lymphadenopathy appriciated.  Symmetrical Chest wall movement, decrease bs about 1/2 up on the right , better air movement in the right upper lung since yesterday Slight crackle at left lung base, minimal exp wheeze RRR,No Gallops,Rubs or new Murmurs, No Parasternal Heave +ve B.Sounds, Abd Soft, No tenderness, No organomegaly appriciated, No rebound - guarding or rigidity. No Cyanosis, Clubbing or edema, No new Rash or bruise      Data Review:    CBC  Recent Labs Lab 11/20/16 0532 11/21/16 0410 11/22/16 0340 11/24/16  9833 11/25/16 0318 11/26/16 0155  WBC 6.9 5.9 6.5 6.2 6.0 7.2  HGB 7.7* 8.0* 8.3* 8.4* 8.6* 9.3*  HCT 24.8* 26.2* 27.4* 26.9* 27.7* 30.1*  PLT 482* 541* 545* 514* 499* 565*  MCV 88.6 89.4 90.7 87.9 88.8 89.3  MCH 27.5 27.3 27.5 27.5 27.6 27.6  MCHC 31.0 30.5 30.3 31.2 31.0 30.9  RDW 17.7* 17.6* 17.9* 17.0* 17.1* 17.0*  LYMPHSABS 1.4 1.3  --   --   --   --   MONOABS 0.6 0.6  --   --   --   --   EOSABS 0.1 0.1  --   --   --   --   BASOSABS 0.0 0.0  --   --   --   --     Chemistries   Recent Labs Lab 11/20/16 0532  11/21/16 0410 11/22/16 0340 11/23/16 0249 11/24/16 0212 11/25/16 0318 11/26/16 0155  NA 143  < > 141 140 137 135 137 137  K 3.2*  < > 3.0* 4.6 4.1 3.6 4.0 3.9  CL 95*  < > 99* 102 97* 95* 98* 99*  CO2 41*  < > 38* 34* 34* 32 29 32  GLUCOSE 99  < > 150* 97 162* 122* 109* 117*  BUN 31*  < > 27* 22* 23* 20 19 13   CREATININE 0.67  < > 0.59 0.61 0.73 0.61 0.54 0.51  CALCIUM 8.1*  < > 8.0* 8.0* 7.8* 7.9* 8.0* 8.2*  MG 2.0  --  1.9 2.0  --   --   --   --   AST  --   --   --   --   --   --   --  18  ALT  --   --   --   --   --   --   --  28  ALKPHOS  --   --   --   --   --   --   --  84  BILITOT  --   --   --   --   --   --   --  0.3  < > = values in this interval not  displayed. ------------------------------------------------------------------------------------------------------------------ No results for input(s): CHOL, HDL, LDLCALC, TRIG, CHOLHDL, LDLDIRECT in the last 72 hours.  No results found for: HGBA1C ------------------------------------------------------------------------------------------------------------------ No results for input(s): TSH, T4TOTAL, T3FREE, THYROIDAB in the last 72 hours.  Invalid input(s): FREET3 ------------------------------------------------------------------------------------------------------------------ No results for input(s): VITAMINB12, FOLATE, FERRITIN, TIBC, IRON, RETICCTPCT in the last 72 hours.  Coagulation profile No results for input(s): INR, PROTIME in the last 168 hours.  No results for input(s): DDIMER in the last 72 hours.  Cardiac Enzymes No results for input(s): CKMB, TROPONINI, MYOGLOBIN in the last 168 hours.  Invalid input(s): CK ------------------------------------------------------------------------------------------------------------------ No results found for: BNP  Inpatient Medications  Scheduled Meds: . acetylcysteine  4 mL Nebulization Q4H  . atorvastatin  20 mg Oral Daily  . budesonide (PULMICORT) nebulizer solution  0.25 mg Nebulization BID  . ceFEPime (MAXIPIME) IV  1 g Intravenous Q8H  . chlorhexidine  15 mL Mouth Rinse BID  . enoxaparin (LOVENOX) injection  40 mg Subcutaneous Q24H  . feeding supplement (ENSURE ENLIVE)  237 mL Oral BID BM  . guaiFENesin  1,200 mg Oral BID  . insulin aspart  0-9 Units Subcutaneous TID WC  . ipratropium-albuterol  3 mL Nebulization Q4H  . mouth rinse  15 mL Mouth Rinse q12n4p  . metoprolol tartrate  100 mg Oral BID  . pantoprazole  40 mg Oral Daily  . predniSONE  20 mg Oral Q breakfast   Followed by  . [START ON 11/27/2016] predniSONE  10 mg Oral Q breakfast   Followed by  . [START ON 11/30/2016] predniSONE  5 mg Oral Q breakfast  .  saccharomyces boulardii  250 mg Oral BID  . vancomycin  750 mg Intravenous Q12H   Continuous Infusions: PRN Meds:.albuterol, fentaNYL (SUBLIMAZE) injection, Gerhardt's butt cream, labetalol, ondansetron (ZOFRAN) IV, potassium chloride (KCL MULTIRUN) 30 mEq in 265 mL IVPB  Micro Results Recent Results (from the past 240 hour(s))  C difficile quick scan w PCR reflex     Status: None   Collection Time: 11/25/16 12:46 PM  Result Value Ref Range Status   C Diff antigen NEGATIVE NEGATIVE Final   C Diff toxin NEGATIVE NEGATIVE Final   C Diff interpretation No C. difficile detected.  Final    Radiology Reports Dg Chest 2 View  Result Date: 11/24/2016 CLINICAL DATA:  Recent right lower lobectomy for empyema. Currently with cough EXAM: CHEST  2 VIEW COMPARISON:  November 23, 2016 FINDINGS: There has been partial clearing of airspace consolidation from the lung bases compared to 1 day prior. There remains patchy bibasilar atelectasis with small pleural effusions bilaterally. Lungs elsewhere are clear. Note that there is volume loss on the right consistent with previous surgery. Heart size is normal. The pulmonary vascular is within normal limits. No adenopathy. No bone lesions. IMPRESSION: Bibasilar atelectasis with small pleural effusions. No consolidation. Stable volume loss on the right. No new opacity. Stable cardiac silhouette. No evident pneumothorax. Electronically Signed   By: Lowella Grip III M.D.   On: 11/24/2016 08:38   Dg Chest 2 View  Result Date: 11/05/2016 CLINICAL DATA:  Shortness of breath and cough. EXAM: CHEST  2 VIEW COMPARISON:  August 28, 2015 FINDINGS: The cardiomediastinal silhouette is stable. No pneumothorax. The left lung is clear. New effusion and underlying opacity seen on the right. IMPRESSION: New effusion and underlying opacity seen in the right lower lobe. No other interval changes. Recommend follow-up to resolution. Electronically Signed   By: Dorise Bullion III M.D    On: 11/05/2016 19:03   Ct Chest Wo Contrast  Result Date: 11/13/2016 CLINICAL DATA:  66 year old female with history of loculated pleural effusion with chest tube in place. EXAM: CT CHEST WITHOUT CONTRAST TECHNIQUE: Multidetector CT imaging of the chest was performed following the standard protocol without IV contrast. COMPARISON:  Chest CT 02/19/2015. Multiple recent prior chest radiographs. FINDINGS: Cardiovascular: Heart size is normal. There is no significant pericardial fluid, thickening or pericardial calcification. There is aortic atherosclerosis, as well as atherosclerosis of the great vessels of the mediastinum and the coronary arteries, including calcified atherosclerotic plaque in the left main and left anterior descending coronary arteries. Left internal jugular central venous catheter with tip terminating in the superior aspect of the right atrium. Mediastinum/Nodes: Patient is intubated, with the tip of the endotracheal to the lying approximately 2.1 cm above the carina. Nasogastric tube extends into the stomach. Numerous borderline enlarged and mildly enlarged mediastinal and bilateral hilar lymph nodes, measuring up to 11 mm in short axis in the subcarinal nodal station. Esophagus is unremarkable in appearance. No axillary lymphadenopathy. Lungs/Pleura: Right-sided chest tube in position with tip directed into the medial aspect of the apex of the right hemithorax. Large complex right-sided pleural fluid and gas collection. Fluid ranges from low to intermediate attenuation, suggesting proteinaceous  contents. Some of the pneumothorax component is confluent anteriorly, while other locules of gas are noted throughout the lower right hemithorax, some of which appear to be within collapsed/consolidated lung tissue, but other small locules of gas likely are present within loculated pleural fluid in the lower right hemithorax. In the medial aspect of the lower right hemithorax there is what appears to be  a thick-walled cavitary area, which is favored to be within the lung parenchyma rather than in the pleural space, measuring up to 6.4 x 2.4 x 6.5 cm (axial image 83 of series 201, and coronal image 56 of series 203). Areas of bronchiectasis are noted within the right lower lobe. Within the aerated portions of the lungs there is patchy multifocal ground-glass attenuation and septal thickening. The ground-glass attenuation is most evident in a peribronchovascular distribution, likely infectious/inflammatory in etiology, most severe in the left upper lobe. No significant left pleural effusion. Upper Abdomen: Unremarkable. Musculoskeletal: Extensive emphysema is noted throughout the subcutaneous fat of the chest wall bilaterally extending up into the cervical regions bilaterally (right greater than left). There are no aggressive appearing lytic or blastic lesions noted in the visualized portions of the skeleton. IMPRESSION: 1. Findings are most compatible with right lower lobe necrotizing pneumonia, with what appears to be a large right lower lobe cavity in the medial aspect of the basal segments of the right lower lobe, as well as a large right empyema. In this complex right pleural gas and fluid collections there both a small anterior pneumothorax, as well as multifocal loculated components of gas within the inferior aspect of the right pleural space, as above. Right-sided chest tube is directed into the medial aspect of the right apex, and the tip of the chest tube is currently surrounded by very little pleural fluid or gas. 2. Multifocal patchy interstitial and airspace disease asymmetrically distributed throughout the remaining aerated portions of the lungs, favored to reflect severe multilobar bronchopneumonia. 3. Aortic atherosclerosis, in addition to left main and left anterior descending coronary artery disease. Please note that although the presence of coronary artery calcium documents the presence of coronary  artery disease, the severity of this disease and any potential stenosis cannot be assessed on this non-gated CT examination. Assessment for potential risk factor modification, dietary therapy or pharmacologic therapy may be warranted, if clinically indicated. 4. Support apparatus, as above. Electronically Signed   By: Vinnie Langton M.D.   On: 11/13/2016 15:19   US Renal  Result Date: 11/06/2016 CLINICAL DATA:  Sepsis EXAM: RENAL / URINARY TRACT ULTRASOUND COMPLETE COMPARISON:  None. FINDINGS: Right Kidney: Length: 9.4 cm, within normal limits. Echogenicity within normal limits. No mass or hydronephrosis visualized. Left Kidney: Length: 10.9 cm, within normal limits. Echogenicity within normal limits. No mass or hydronephrosis visualized. Bladder: Appears normal for degree of bladder distention. IMPRESSION: Negative bilateral renal ultrasound. Electronically Signed   By: San Morelle M.D.   On: 11/06/2016 12:41   Dg Chest Port 1 View  Result Date: 11/26/2016 CLINICAL DATA:  Respiratory failure. Hx of COPD, partial right lung removed(11/20/16), hypertension, primary spontaneous pneumothorax. Nonsmoker, 2nd hand smoke exposure. EXAM: PORTABLE CHEST 1 VIEW COMPARISON:  Chest x-rays dated 11/25/2016 and 11/24/2016. FINDINGS: There is now complete opacification the right hemithorax, presumably large pleural effusion and/or airspace collapse, favor large pleural effusion given the stable position of the mediastinum. There is stable volume loss on the right that is compatible with history of previous right lower lobe resection. Left lung is clear. Osseous structures  about the chest are unremarkable. IMPRESSION: Complete opacification of the right hemithorax, significantly worsened compared to yesterday's exam, favor large pleural effusion. These results will be called to the ordering clinician or representative by the Radiologist Assistant, and communication documented in the PACS or zVision Dashboard.  Electronically Signed   By: Franki Cabot M.D.   On: 11/26/2016 07:33   Dg Chest Port 1 View  Result Date: 11/25/2016 CLINICAL DATA:  Concern for spontaneous pneumothorax. Personal history of spontaneous pneumothorax. Shortness of breath, acute onset. Initial encounter. EXAM: PORTABLE CHEST 1 VIEW COMPARISON:  Chest radiograph performed earlier today at 6:43 a.m. FINDINGS: A small right pleural effusion is suspected, with associated airspace consolidation. This raises concern for pneumonia, new from the prior study. Minimal left basilar opacity likely reflects atelectasis or scarring, stable from the prior study. No pneumothorax is seen. The patient is status post right lower lobectomy. Underlying chronic right pleural thickening is noted. The cardiomediastinal silhouette is mildly enlarged. No acute osseous abnormalities are identified. IMPRESSION: 1. Small right pleural effusion suspected, with associated airspace consolidation. This raises concern for pneumonia, new from the prior study. Minimal left basilar airspace opacity likely reflects atelectasis or scarring. 2. No evidence of pneumothorax. 3. Mild cardiomegaly. Electronically Signed   By: Garald Balding M.D.   On: 11/25/2016 17:46   Dg Chest Port 1 View  Result Date: 11/25/2016 CLINICAL DATA:  Respiratory failure EXAM: PORTABLE CHEST 1 VIEW COMPARISON:  Yesterday FINDINGS: Streaky densities at the bases, right more than left. Asymmetric elevation of the right diaphragm, increased. There is right pleural thickening reaching the apex. Borderline cardiomegaly, accentuated by technique. Small left effusions based on lateral film yesterday. No Kerley lines or pneumothorax. IMPRESSION: 1. Mildly increased atelectasis at the right base. 2. Right pleural thickening and lower lobectomy. Electronically Signed   By: Monte Fantasia M.D.   On: 11/25/2016 08:32   Dg Chest Port 1 View  Result Date: 11/23/2016 CLINICAL DATA:  CHEST TUBE REMOVAL/RIGHT EXAM:  PORTABLE CHEST 1 VIEW COMPARISON:  11/23/2016 FINDINGS: Right chest tube has been removed. No pneumothorax. Heart size is accentuated by the AP portable technique. There are patchy densities in the lung bases bilaterally which partially obscure the hemidiaphragms, increased on the left. Opacity at the left lung base now obscures the hemidiaphragm. No pulmonary edema. IMPRESSION: Bilateral lower lobe opacities, increased on the left. No pneumothorax following removal of chest tube. Electronically Signed   By: Nolon Nations M.D.   On: 11/23/2016 12:53   Dg Chest Port 1 View  Result Date: 11/23/2016 CLINICAL DATA:  Shortness of breath.  Pleural effusion . EXAM: PORTABLE CHEST 1 VIEW COMPARISON:  11/22/2016.  11/21/2016 . FINDINGS: Right chest tube in stable position. No pneumothorax. Stable cardiomegaly. Interim improvement of pulmonary interstitial prominence suggesting improvement of pulmonary interstitial edema. Tiny bilateral pleural effusions. Stable biapical pleural thickening most likely secondary scarring . Prominent skin folds noted over the chest. IMPRESSION: 1. Right chest tube in stable position.  No pneumothorax. 2. Stable cardiomegaly. Interim improvement of pulmonary interstitial prominence suggesting improving interstitial edema. Tiny bilateral pleural effusions again noted. Electronically Signed   By: Marcello Moores  Register   On: 11/23/2016 07:34   Dg Chest Port 1 View  Result Date: 11/22/2016 CLINICAL DATA:  66 y/o  F; increasing shortness of breath. EXAM: PORTABLE CHEST 1 VIEW COMPARISON:  11/21/2016 chest radiograph. FINDINGS: Stable cardiac silhouette given projection and technique. Aortic atherosclerosis with calcification. Stable position of right-sided chest tube. Increase interstitial opacities in  the lungs bilaterally probably represents developing interstitial pulmonary edema. Increasing opacity of the right lung base probably represents worsening pleural effusion and atelectasis. No acute  osseous abnormality identified. IMPRESSION: Increase interstitial opacities probably representing developing edema. Increasing opacity of the right lung and right lung base, likely increasing pleural effusion. No appreciable pneumothorax. Electronically Signed   By: Kristine Garbe M.D.   On: 11/22/2016 05:29   Dg Chest Port 1 View  Result Date: 11/21/2016 CLINICAL DATA:  History of pneumothorax.  Chest tube removal. EXAM: PORTABLE CHEST 1 VIEW COMPARISON:  11/20/2016. FINDINGS: Interim extubation. Interim removal of the lateral most chest tube. Two medial most chest tube is in stable position. Left IJ line in stable position . Heart size stable. Right base subsegmental atelectasis. Perihilar atelectasis/infiltrate. Small bilateral pleural effusions. No pneumothorax. IMPRESSION: 1. Interim extubation. Interim removal of lateral most chest tube. Two medial most chest tubes in stable position. Left IJ line stable position. No pneumothorax. 2. Persistent right base subsegmental atelectasis. Mild left perihilar atelectasis/ infiltrate noted on today's exam. Small bilateral pleural effusions. Electronically Signed   By: Marcello Moores  Register   On: 11/21/2016 06:45   Dg Chest Port 1 View  Result Date: 11/20/2016 CLINICAL DATA:  Status post thoracoscopy and empyema drainage with decortication on the right. EXAM: PORTABLE CHEST 1 VIEW COMPARISON:  Portable chest x-ray of November 19, 2016 FINDINGS: There remains mild volume loss on the right. There is no pneumothorax. The right-sided chest tube tip projects over the posterior aspect of the fourth rib. A second more medially positioned chest tube has its tip approximately 2 cm lateral to the hands. A third chest tube has its tip projecting over the medial aspect of the ninth rib. The left lung is well-expanded. Minimal basilar atelectasis is suspected on the left. The cardiac silhouette is top-normal in size. The pulmonary vascularity is not engorged. The endotracheal  tube tip lies approximately 3.7 cm above the carina. The esophagogastric tube tip projects below the inferior margin of the image. The left internal jugular venous catheter tip projects over the distal third of the SVC. IMPRESSION: Fairly stable appearance of the chest. No right-sided pneumothorax or significant pleural effusion. Minimal postsurgical volume loss due to lower lobectomy. Minimal left basilar atelectasis. The support tubes are in reasonable position. Electronically Signed   By: David  Martinique M.D.   On: 11/20/2016 07:08   Dg Chest Port 1 View  Result Date: 11/19/2016 CLINICAL DATA:  Pneumonia EXAM: PORTABLE CHEST 1 VIEW COMPARISON:  11/18/2016 FINDINGS: Three chest tubes on the right unchanged. No pneumothorax. Minimal right effusion unchanged. Mild bibasilar airspace disease unchanged Endotracheal tube 2.5 cm above the carina. Left jugular central venous catheter tip in the right atrium. NG tube enters the stomach. IMPRESSION: No change from yesterday. Electronically Signed   By: Franchot Gallo M.D.   On: 11/19/2016 07:07   Dg Chest Port 1 View  Result Date: 11/18/2016 CLINICAL DATA:  Evaluate ETT. EXAM: PORTABLE CHEST 1 VIEW COMPARISON:  November 17, 2016 FINDINGS: The ETT is in good position. Right-sided chest tubes remain, in good position. The enteric tube terminates below today's study but the side port is below the GE junction within the stomach. No pneumothorax. The cardiomediastinal silhouette is stable. Small bilateral pleural effusions with underlying atelectasis, unchanged. A left-sided central line terminates near the caval atrial junction. It is possible it could extend into the right atrium. This appears to be unchanged in the interval. No overt edema. IMPRESSION: 1. Stable support  apparatus. The distal tip of the right central line is near the caval atrial junction, either within the distal SVC or just within the right atrium. 2. Small bilateral effusions with underlying atelectasis  remain. Electronically Signed   By: Dorise Bullion III M.D   On: 11/18/2016 07:26   Dg Chest Port 1 View  Result Date: 11/17/2016 CLINICAL DATA:  Chest 2.  Change in respirations and breath sounds EXAM: PORTABLE CHEST 1 VIEW COMPARISON:  11/17/2016 FINDINGS: Right chest tube, endotracheal tube, NG tube and left central line remain in place, unchanged. Bibasilar atelectasis is stable. No pneumothorax. Probable small effusions. IMPRESSION: No pneumothorax. Continued bibasilar atelectasis and small effusions. Electronically Signed   By: Rolm Baptise M.D.   On: 11/17/2016 12:57   Dg Chest Port 1 View  Result Date: 11/17/2016 CLINICAL DATA:  Respiratory failure EXAM: PORTABLE CHEST 1 VIEW COMPARISON:  11/16/2016 FINDINGS: Endotracheal tube, nasogastric catheter and left jugular central line are again seen and stable. Chest tubes are noted on the right and stable. No pneumothorax is seen. Bibasilar atelectatic changes are seen. No bony abnormality is noted. IMPRESSION: Tubes and lines as described.  No pneumothorax is noted. Stable bibasilar changes. Electronically Signed   By: Inez Catalina M.D.   On: 11/17/2016 07:32   Dg Chest Port 1 View  Result Date: 11/16/2016 CLINICAL DATA:  Acute respiratory failure EXAM: PORTABLE CHEST 1 VIEW COMPARISON:  11/15/2016 FINDINGS: Cardiac shadow remains mildly enlarged. Endotracheal tube, nasogastric catheter and left jugular central line are again seen. Multiple right chest tubes are again noted and stable. No definitive pneumothorax is seen. Bibasilar infiltrative changes are noted right greater than left. The changes on the left has increased slightly in the interval from the prior exam. IMPRESSION: Postsurgical changes. No pneumothorax is seen. Bibasilar infiltrative changes are seen. Electronically Signed   By: Inez Catalina M.D.   On: 11/16/2016 07:34   Dg Chest Port 1 View  Result Date: 11/15/2016 CLINICAL DATA:  Right lower lobe lobectomy for pulmonary  abscess/empyema EXAM: PORTABLE CHEST 1 VIEW COMPARISON:  11/15/2016 FINDINGS: The heart size and mediastinal contours are within normal limits. Endotracheal tube tip is slightly low lying at 2.4 cm above the carina. Pullback approximately 1 cm recommended. Left IJ central line catheter is noted at the cavoatrial junction. Two right-sided chest tubes are identified projecting over the lung apex with decrease in previously noted loculated pleural opacity along the periphery of the right hemithorax. Bibasilar atelectasis is identified. No significant pneumothorax. Gastric tube extends below the left hemidiaphragm into the expected location the stomach. The tip is excluded however. No acute osseous abnormality appear IMPRESSION: 1. Slightly low-lying endotracheal tube tip approximately 2.4 cm above the carina. Pullback approximately 1 cm recommended. Otherwise, satisfactory support line and tube positions. These results will be called to the ordering clinician or representative by the Radiologist Assistant, and communication documented in the PACS or zVision Dashboard. 2. Residual atelectasis at the right lung base without appreciable pneumothorax status post right lower lobectomy. Electronically Signed   By: Ashley Royalty M.D.   On: 11/15/2016 19:57   Dg Chest Port 1 View  Result Date: 11/15/2016 CLINICAL DATA:  Respiratory failure. EXAM: PORTABLE CHEST 1 VIEW COMPARISON:  11/14/2016 CT 11/13/2016 . FINDINGS: Endotracheal tube, NG tube, left IJ line, right chest tube in stable position. No definite pneumothorax identified. Previously identified anterior pneumothorax best identified on CT of 11/13/2016. Persistent basilar infiltrates, particularly prominent on the right. Persistent prominent right sided pleural effusions/possible  empyema again noted. No change. IMPRESSION: 1. Lines and tubes including right chest tube in stable position. No pneumothorax identified. Previously identified anterior pneumothorax best  demonstrated by prior CT of 11/13/2016. 2. Persistent loculated right pleural effusion/empyema. No acute change. Persistent basilar infiltrates, particular prominent on the right. No change. Electronically Signed   By: Marcello Moores  Register   On: 11/15/2016 06:46   Dg Chest Port 1 View  Result Date: 11/14/2016 CLINICAL DATA:  Respiratory failure. EXAM: PORTABLE CHEST 1 VIEW COMPARISON:  CT 11/13/2016.  Chest x-ray 11/13/2016. FINDINGS: Endotracheal tube, NG tube, left IJ line, right chest tube stable position. Heart size stable. Bilateral pulmonary infiltrates with right side pleural effusion/ possible empyema again noted. Previously identified anterior pneumothorax is best demonstrated by CT of 11/13/2016. Mild chest wall subcutaneous emphysema again noted. IMPRESSION: 1. Lines and tubes including right chest tube in stable position. 2. Persistent bibasilar infiltrates. Persistent right sided prominent pleural effusion/ possible empyema again noted without significant change. Previously identified anterior pneumothorax best demonstrated about recent CT. No definite pneumothorax noted on this chest x-ray. Electronically Signed   By: Marcello Moores  Register   On: 11/14/2016 07:04   Dg Chest Port 1 View  Result Date: 11/13/2016 CLINICAL DATA:  Pneumothorax.  Shortness of breath . EXAM: PORTABLE CHEST 1 VIEW COMPARISON:  11/12/2016. FINDINGS: Endotracheal tube, left IJ line, NG tube, right chest tube in stable position. No pneumothorax. Persistent right side pleural effusion with increase in size from prior exam. Right base atelectasis. Heart size normal. Subcutaneous emphysema has improved. IMPRESSION: 1. Lines and tubes is including right chest tube in stable position. No pneumothorax. 2.  Interim increase in right pleural effusion. Electronically Signed   By: Marcello Moores  Register   On: 11/13/2016 06:47   Dg Chest Port 1 View  Result Date: 11/12/2016 CLINICAL DATA:  Patient with history of pneumothorax. EXAM: PORTABLE  CHEST 1 VIEW COMPARISON:  Chest radiograph 11/11/2016 FINDINGS: ET tube terminates in the mid trachea. Central venous catheter tip projects over the right atrium. Monitoring leads overlie the patient. Right-sided chest tube projects over the right lung apex. Stable cardiac and mediastinal contours. Persistent heterogeneous opacities right mid lower lung. Heterogeneous opacities left lung base. Moderate right pleural effusion. No definite pneumothorax. Subcutaneous emphysema. IMPRESSION: Stable support apparatus. Persistent moderate right pleural effusion with underlying opacities. No visible right-sided pneumothorax. Electronically Signed   By: Lovey Newcomer M.D.   On: 11/12/2016 08:40   Dg Chest Port 1 View  Result Date: 11/11/2016 CLINICAL DATA:  Continued surveillance pneumothorax. EXAM: PORTABLE CHEST 1 VIEW COMPARISON:  11/10/2016. FINDINGS: Unchanged cardiomediastinal silhouette and support apparatus. RIGHT chest tube remains in good position in the RIGHT lung apex. Visualized pneumothorax is less, although RIGHT pleural effusion is increased. BILATERAL pulmonary opacities appear worse. Subcutaneous emphysema redemonstrated. IMPRESSION: No visible pneumothorax.  Increasing RIGHT effusion. Electronically Signed   By: Staci Righter M.D.   On: 11/11/2016 07:51   Dg Chest Port 1 View  Result Date: 11/10/2016 CLINICAL DATA:  Pneumothorax.  Chest tube. EXAM: PORTABLE CHEST 1 VIEW COMPARISON:  Chest x-ray 11/10/2016. FINDINGS: Endotracheal tube, left IJ line, NG tube, right chest tube in stable position. Heart size normal. Bilateral pulmonary infiltrates. Small right pneumothorax noted on today's exam. Diffuse chest wall subcutaneous emphysema again noted. IMPRESSION: 1. Lines and tubes including right chest tube in stable position. Small right pneumothorax noted on today's exam. Diffuse chest wall subcutaneous emphysema noted. 2. Diffuse bilateral pulmonary infiltrates. Critical Value/emergent results were  called by  telephone at the time of interpretation on 11/10/2016 at 3:17 pm to nurse Anderson Malta, who verbally acknowledged these results. Electronically Signed   By: Marcello Moores  Register   On: 11/10/2016 15:19   Dg Chest Port 1 View  Result Date: 11/10/2016 CLINICAL DATA:  Respiratory failure. EXAM: PORTABLE CHEST 1 VIEW COMPARISON:  11/09/2016. FINDINGS: Endotracheal tube, NG tube, left IJ line in stable position . Right chest tube in stable position. No pneumothorax. Diffuse bilateral chest wall and neck subcutaneous emphysema. Subcutaneous emphysema has increased significantly from prior exam. IMPRESSION: 1. Lines and tubes in stable position. Right chest tube in stable position. No pneumothorax. 2. Persistent bilateral pulmonary infiltrates, right greater than left. Small right pleural effusion. No interim change. 3. Interim significant progression of diffuse bilateral chest wall and bilateral neck subcutaneous emphysema. Electronically Signed   By: Marcello Moores  Register   On: 11/10/2016 07:13   Dg Chest Port 1 View  Result Date: 11/09/2016 CLINICAL DATA:  Post right chest tube placement EXAM: PORTABLE CHEST 1 VIEW COMPARISON:  11/09/2016 FINDINGS: Cardiomediastinal silhouette is stable. Persistent bilateral basilar infiltrates right greater than left. Small right pleural effusion. There is right chest tube in place with tip in right apex. No definite pneumothorax. Subcutaneous emphysema noted right axilla. Stable endotracheal and NG tube position. Stable left IJ central line position. IMPRESSION: Stable support apparatus. Persistent bilateral basilar infiltrates right greater than left. Small right pleural effusion. There is right chest tube in place with tip in right apex. No definite pneumothorax. Subcutaneous emphysema noted right axilla. Electronically Signed   By: Lahoma Crocker M.D.   On: 11/09/2016 10:27   Dg Chest Port 1 View  Result Date: 11/09/2016 CLINICAL DATA:  Respiratory failure EXAM: PORTABLE CHEST 1  VIEW COMPARISON:  11/08/2016 FINDINGS: Endotracheal tube, nasogastric catheter and left jugular central line are again seen and stable. Cardiac shadow is stable. Bibasilar infiltrates and effusions are seen slightly worse on the right than the left. New left upper lobe infiltrative changes are noted. A new right-sided pneumothorax is noted with approximately 2 cm excursion at the apex at and 1 cm excursion laterally. IMPRESSION: New right pneumothorax. Bibasilar infiltrates as well as new left upper lobe infiltrate are seen. Effusions are seen right greater than left. Critical Value/emergent results were called by telephone at the time of interpretation on 11/09/2016 at 7:04 am to [facility], the pts nurse, who verbally acknowledged these results and will contact primary physician. Electronically Signed   By: Inez Catalina M.D.   On: 11/09/2016 07:05   Portable Chest Xray  Result Date: 11/08/2016 CLINICAL DATA:  Hypoxia EXAM: PORTABLE CHEST 1 VIEW COMPARISON:  November 07, 2016 FINDINGS: Endotracheal tube tip is 1.5 cm above the carina. Nasogastric tube tip and side port are below the diaphragm. Central catheter tip is at the cavoatrial junction. No pneumothorax. There is airspace consolidation in both lower lobes, not significantly changed from 1 day prior. There are small pleural effusions bilaterally. Heart size and pulmonary vascularity are normal. There is atherosclerotic calcification in the aorta. No adenopathy. No bone lesions. IMPRESSION: Tube and catheter positions as described without evident pneumothorax. Airspace opacity suspicious for pneumonia in both lower lobes. Small pleural effusions bilaterally. No interstitial edema appreciable. Heart size normal. There is aortic atherosclerosis. Electronically Signed   By: Lowella Grip III M.D.   On: 11/08/2016 07:41   Dg Chest Port 1 View  Result Date: 11/07/2016 CLINICAL DATA:  Central line placement. EXAM: PORTABLE CHEST 1 VIEW COMPARISON:  Earlier today  at 1412 hours FINDINGS: 1527 hours. Left internal jugular line is difficult to follow centrally secondary to overlying wires and leads. Terminates at least at the level of the high right atrium. Endotracheal and nasogastric tubes are unchanged. Normal heart size. Atherosclerosis in the transverse aorta. Small left pleural effusion is unchanged. No pneumothorax. Bibasilar airspace disease is not significantly changed. Mild interstitial edema. IMPRESSION: Left internal jugular line is difficult to follow centrally secondary to overlying wires and leads. Followed to at least the level of the high right atrium. Consider retraction 2-3 cm with repeat imaging (ideally after removing EKG leads and wires). Otherwise, similar appearance of the chest with bibasilar airspace disease, left pleural fluid, and interstitial edema. Electronically Signed   By: Abigail Miyamoto M.D.   On: 11/07/2016 15:56   Dg Chest Port 1 View  Result Date: 11/07/2016 CLINICAL DATA:  Endotracheal tube EXAM: PORTABLE CHEST 1 VIEW COMPARISON:  03/09/2017 FINDINGS: Small bilateral pleural effusions. Right lower lobe airspace disease. No pneumothorax. Stable cardiomediastinal silhouette. Endotracheal to with the tip 3.5 cm above the carina. Nasogastric tube coursing below the diaphragm. IMPRESSION: 1. Small bilateral pleural effusions. Right lower lobe airspace disease concerning for pneumonia. 2. Small bilateral pleural effusions. Electronically Signed   By: Kathreen Devoid   On: 11/07/2016 14:29   Dg Chest Port 1 View  Result Date: 11/07/2016 CLINICAL DATA:  Pleural effusion EXAM: PORTABLE CHEST 1 VIEW COMPARISON:  Chest radiograph 11/05/2016 FINDINGS: Unchanged cardiomediastinal contours. Small right pleural effusion and associated basilar consolidation is unchanged. The left lung is clear. IMPRESSION: Unchanged small right pleural effusion and right basilar consolidation. Electronically Signed   By: Ulyses Jarred M.D.   On: 11/07/2016 02:43   Dg  Abd Portable 1v  Result Date: 11/07/2016 CLINICAL DATA:  Enteric tube placement EXAM: PORTABLE ABDOMEN - 1 VIEW COMPARISON:  None. FINDINGS: Enteric tube terminates in the distal body of the stomach. No disproportionately dilated small bowel loops. Mild colonic stool volume. No evidence of pneumatosis or pneumoperitoneum. Patchy bibasilar lung opacities. IMPRESSION: 1. Enteric tube terminates in the distal body of the stomach. 2. Nonobstructive bowel gas pattern. 3. Nonspecific patchy bibasilar lung opacities, correlate with chest radiograph. Electronically Signed   By: Ilona Sorrel M.D.   On: 11/07/2016 14:29   Ir US Chest  Result Date: 11/06/2016 CLINICAL DATA:  Patient admitted with right-sided pneumonia. Recent imaging suggested possible right-sided pleural effusion. Request is made for thoracentesis. EXAM: CHEST ULTRASOUND COMPARISON:  None. FINDINGS: Minimal right-sided effusion. Most of what is seen is consolidation of the right lung secondary to pneumonia. IMPRESSION: Unable to perform thoracentesis secondary to minimal effusion. Read by: Saverio Danker, PA-C Electronically Signed   By: Aletta Edouard M.D.   On: 11/06/2016 11:57    Time Spent in minutes  30 min, critical care time   Jani Gravel M.D on 11/26/2016 at 7:52 AM  Between 7am to 7pm - Pager - [phone]  After 7pm go to [email or web address] - password [facility]  [facility] -  Office  [phone]

## 2016-11-26 NOTE — Progress Notes (Signed)
PULMONARY  / CRITICAL CARE MEDICINE  Name: Diana Gardner MRN: [id number] DOB: [birth year]    LOS: 38  REFERRING MD :  [facility]  CHIEF COMPLAINT:  Acute on chronic hypercarbic respiratory failure  BRIEF PATIENT DESCRIPTION: Diana Gardner is a 66 year old woman with COPD on home oxygen hospitalized for RLL necrotizing CAP complicated by pneumothorax and empyema s/p VATS and lobectomy who developed acute on chronic hypercarbic respiratory failure.  She improved and was transferred out of the ICU under [facility] service.   On 4/7, pt had hypoxemia and resp distress.  CXR was concerning for mucus plug.  PCCM was asked to see again.   LINES / TUBES:  3/20 Radial a-line >> 3/25 3/28 Chest tube >> discontinued  CULTURES: 3/20 Tracheal aspirate >> NGTD x 2 days 3/21 Respiratory viral panel negative 3/21 Blood cultures x 2 >> NGTD x 5 days 3/28 R pleural fluid NGTD x 5 days  ANTIBIOTICS: Azithromycin 3/18 > discontinued Ceftriaxone 3/18 >> 3/21 Vanc 3/21 >> 3/21 Unasyn 3/21 > discontinued Cefepime 4/7 > Vanc 4/7 >   SIGNIFICANT EVENTS:  3/18 Admitted 3/20 transfer to ICU and intubated 3/21 refractory shock 3/22 Right PTX > chest tube placed. Pressors stopped 3/28 VATS, right mini thoracotomy, drainage of empyema, decortication, right lower lobectomy. 4/02 Extubated 4/03 Re-admitted to ICU for acute hypercarbic respiratory failure. Improved on BiPAP. 4/07 PCCM asked to see for desatn/mucus plug   INTERVAL HISTORY:  Tolerating CPT and neb meds.  Less SOB but remains on 4L.  On and off cough.    VITAL SIGNS: Temp:  [97.5 F (36.4 C)-99 F (37.2 C)] 97.7 F (36.5 C) (04/08 0747) Pulse Rate:  [56-72] 67 (04/08 0747) Resp:  [19-28] 20 (04/08 0747) BP: (131-170)/(56-73) 151/65 (04/08 0747) SpO2:  [94 %-99 %] 95 % (04/08 0945) FiO2 (%):  [100 %] 100 % (04/07 1800)   VENTILATOR SETTINGS: FiO2 (%):  [100 %] 100 %  INTAKE / OUTPUT: Intake/Output      04/07 0701 - 04/08 0700 04/08  0701 - 04/09 0700   P.O. 600    IV Piggyback 450    Total Intake(mL/kg) 1050 (16.4)    Urine (mL/kg/hr) 576 (0.4)    Stool 1 (0)    Total Output 577     Net +473          Urine Occurrence 5 x    Stool Occurrence 8 x      PHYSICAL EXAMINATION: .Physical Exam  Constitutional: She is oriented to person, place, and time. No distress.  HENT:  Head: Normocephalic and atraumatic.  Eyes: Conjunctivae are normal. No scleral icterus.  Cardiovascular: Normal rate.   Pulmonary/Chest: No respiratory distress. She has rales.  Bilateral basilar crackles  Abdominal: Soft. She exhibits no distension.  Neurological: She is alert and oriented to person, place, and time.  Skin: She is not diaphoretic.   Vitals:  Vitals:   11/26/16 0300 11/26/16 0355 11/26/16 0747 11/26/16 0945  BP: (!) 170/71  (!) 151/65   Pulse: 72  67   Resp: (!) 27  20   Temp: 98.5 F (36.9 C)  97.7 F (36.5 C)   TempSrc: Oral  Oral   SpO2: 94% 95% 99% 95%  Weight:      Height:        Constitutional/General: well-nourished, well-developed, in mild distress.   Body mass index is 24.29 kg/m. Wt Readings from Last 3 Encounters:  11/22/16 64.2 kg (141 lb 8.6 oz)  01/31/16 68.5 kg (  151 lb)  11/23/15 69.4 kg (153 lb)    HEENT: PERLA, anicteric sclerae. (-) Oral thrush.  Neck: No masses. Midline trachea. No JVD, (-) LAD. (-) bruits appreciated.  Respiratory/Chest: Grossly normal chest. (-) deformity. (-) Accessory muscle use.  Symmetric expansion. Diminished BS on the R lung. Crackles/rhonchi in R lung.  (-) wheezing,  (-) egophony  Cardiovascular: Regular rate and  rhythm, heart sounds normal, no murmur or gallops,  Trace peripheral edema  Gastrointestinal:  Normal bowel sounds. Soft, non-tender. No hepatosplenomegaly.  (-) masses.   Musculoskeletal:  Normal muscle tone.   Extremities: Grossly normal. (-) clubbing, cyanosis.  (-) edema  Skin: (-) rash,lesions seen.   Neurological/Psychiatric :  sedated, intubated. CN grossly intact. (-) lateralizing signs.   CBC Recent Labs     11/24/16  0212  11/25/16  0318  11/26/16  0155  WBC  6.2  6.0  7.2  HGB  8.4*  8.6*  9.3*  HCT  26.9*  27.7*  30.1*  PLT  514*  499*  565*    Coag's No results for input(s): APTT, INR in the last 72 hours.  BMET Recent Labs     11/24/16  0212  11/25/16  0318  11/26/16  0155  NA  135  137  137  K  3.6  4.0  3.9  CL  95*  98*  99*  CO2  32  29  32  BUN  20  19  13   CREATININE  0.61  0.54  0.51  GLUCOSE  122*  109*  117*    Electrolytes Recent Labs     11/24/16  0212  11/25/16  0318  11/26/16  0155  CALCIUM  7.9*  8.0*  8.2*    Sepsis Markers No results for input(s): PROCALCITON, O2SATVEN in the last 72 hours.  Invalid input(s): LACTICACIDVEN  ABG No results for input(s): PHART, PCO2ART, PO2ART in the last 72 hours.  Liver Enzymes Recent Labs     11/26/16  0155  AST  18  ALT  28  ALKPHOS  84  BILITOT  0.3  ALBUMIN  1.9*    Cardiac Enzymes No results for input(s): TROPONINI, PROBNP in the last 72 hours.  Glucose Recent Labs     11/24/16  2134  11/25/16  0815  11/25/16  1156  11/25/16  1615  11/25/16  2103  11/26/16  0749  GLUCAP  149*  113*  209*  168*  116*  116*    Imaging Dg Chest Port 1 View  Result Date: 11/26/2016 CLINICAL DATA:  Respiratory failure. Hx of COPD, partial right lung removed(11/20/16), hypertension, primary spontaneous pneumothorax. Nonsmoker, 2nd hand smoke exposure. EXAM: PORTABLE CHEST 1 VIEW COMPARISON:  Chest x-rays dated 11/25/2016 and 11/24/2016. FINDINGS: There is now complete opacification the right hemithorax, presumably large pleural effusion and/or airspace collapse, favor large pleural effusion given the stable position of the mediastinum. There is stable volume loss on the right that is compatible with history of previous right lower lobe resection. Left lung is clear. Osseous structures about the chest are unremarkable.  IMPRESSION: Complete opacification of the right hemithorax, significantly worsened compared to yesterday's exam, favor large pleural effusion. These results will be called to the ordering clinician or representative by the Radiologist Assistant, and communication documented in the PACS or zVision Dashboard. Electronically Signed   By: Franki Cabot M.D.   On: 11/26/2016 07:33   Dg Chest Port 1 View  Result Date: 11/25/2016 CLINICAL DATA:  Concern for spontaneous pneumothorax. Personal history of spontaneous pneumothorax. Shortness of breath, acute onset. Initial encounter. EXAM: PORTABLE CHEST 1 VIEW COMPARISON:  Chest radiograph performed earlier today at 6:43 a.m. FINDINGS: A small right pleural effusion is suspected, with associated airspace consolidation. This raises concern for pneumonia, new from the prior study. Minimal left basilar opacity likely reflects atelectasis or scarring, stable from the prior study. No pneumothorax is seen. The patient is status post right lower lobectomy. Underlying chronic right pleural thickening is noted. The cardiomediastinal silhouette is mildly enlarged. No acute osseous abnormalities are identified. IMPRESSION: 1. Small right pleural effusion suspected, with associated airspace consolidation. This raises concern for pneumonia, new from the prior study. Minimal left basilar airspace opacity likely reflects atelectasis or scarring. 2. No evidence of pneumothorax. 3. Mild cardiomegaly. Electronically Signed   By: Garald Balding M.D.   On: 11/25/2016 17:46   Dg Chest Port 1 View  Result Date: 11/25/2016 CLINICAL DATA:  Respiratory failure EXAM: PORTABLE CHEST 1 VIEW COMPARISON:  Yesterday FINDINGS: Streaky densities at the bases, right more than left. Asymmetric elevation of the right diaphragm, increased. There is right pleural thickening reaching the apex. Borderline cardiomegaly, accentuated by technique. Small left effusions based on lateral film yesterday. No Kerley  lines or pneumothorax. IMPRESSION: 1. Mildly increased atelectasis at the right base. 2. Right pleural thickening and lower lobectomy. Electronically Signed   By: Monte Fantasia M.D.   On: 11/25/2016 08:32     ASSESSMENT / PLAN:  Acute on chronic hypoxemic hypercarbic respiratory failure Right lower lobe necrotizing pneumonia Right empyema status post VATS decortication and right lower lobe lobectomy, POD #10 New R lung atelectasis/mucus plug/reaccumulatiuon of pleural fluid (4/7, worse CXR on 4/8)  - we started chest PT q4, mucomyst and duoneb q4 on 4/7 and CXR on 4/8 is worse.   - bedside US did not reveal a lot of effusoion - I suspects she has mucus plug + new consolidation + loculated effusion - she will need a chest ct scan and will decide further management based on chest ct scan. - If chest ct scan has lung collapse, pls keep her NPO after MN as we will plan on doing bronchoscopy on 4/9 if schedule will permit.  - cont chest PT and neb meds  - cont cefepime and vanc.  - DVT prophylaxis   - plan d/w pt and daughter.   Monica Becton, MD 11/26/2016, 11:27 AM [place] [facility] Pager [phone] After 3 pm or if no answer, call [phone]

## 2016-11-26 NOTE — Progress Notes (Signed)
Assessed right pleural space at bedside with ultrasound.  Small amount of pleural fluid with some fibrinous stranding.  Appears to be consolidated lung/atelectasis.  Image below.        Noe Gens, NP-C  Pulmonary & Critical Care Pgr: [phone] or if no answer [phone] 11/26/2016, 10:42 AM

## 2016-11-26 NOTE — Progress Notes (Signed)
Paged CCM to notify of CT results; await plan

## 2016-11-27 ENCOUNTER — Inpatient Hospital Stay (HOSPITAL_COMMUNITY): Payer: Medicare Other

## 2016-11-27 LAB — GLUCOSE, CAPILLARY
GLUCOSE-CAPILLARY: 161 mg/dL — AB (ref 65–99)
GLUCOSE-CAPILLARY: 199 mg/dL — AB (ref 65–99)
GLUCOSE-CAPILLARY: 215 mg/dL — AB (ref 65–99)
Glucose-Capillary: 133 mg/dL — ABNORMAL HIGH (ref 65–99)

## 2016-11-27 LAB — PROTEIN, TOTAL: TOTAL PROTEIN: 5.3 g/dL — AB (ref 6.5–8.1)

## 2016-11-27 LAB — LACTATE DEHYDROGENASE: LDH: 191 U/L (ref 98–192)

## 2016-11-27 MED ORDER — ACETYLCYSTEINE 20 % IN SOLN
4.0000 mL | Freq: Four times a day (QID) | RESPIRATORY_TRACT | Status: DC
  Administered 2016-11-27 – 2016-11-28 (×3): 4 mL via RESPIRATORY_TRACT
  Filled 2016-11-27 (×4): qty 4

## 2016-11-27 MED ORDER — LIDOCAINE HCL 1 % IJ SOLN
INTRAMUSCULAR | Status: AC
  Filled 2016-11-27: qty 20

## 2016-11-27 MED ORDER — IPRATROPIUM-ALBUTEROL 0.5-2.5 (3) MG/3ML IN SOLN
3.0000 mL | Freq: Four times a day (QID) | RESPIRATORY_TRACT | Status: DC
  Administered 2016-11-27 – 2016-11-28 (×3): 3 mL via RESPIRATORY_TRACT
  Filled 2016-11-27 (×4): qty 3

## 2016-11-27 NOTE — Progress Notes (Signed)
PULMONARY  / CRITICAL CARE MEDICINE  Name: Diana Gardner MRN:   [id number] DOB:   Feb 12, 1951               REFERRING MD :  [facility]  CHIEF COMPLAINT:  Acute on chronic hypercarbic respiratory failure  BRIEF PATIENT DESCRIPTION: Diana Gardner is a 66 year old woman with COPD on home oxygen hospitalized for RLL necrotizing CAP complicated by pneumothorax and empyema s/p VATS and lobectomy who developed acute on chronic hypercarbic respiratory failure.  She improved and was transferred out of the ICU under [facility] service.   On 4/7, pt had hypoxemia and resp distress.  CXR was concerning for mucus plug.  PCCM was asked to see again.   LINES / TUBES:  3/20 Radial a-line >> 3/25 3/28 Chest tube >> discontinued  CULTURES: 3/20 Tracheal aspirate >> NGTD x 2 days 3/21 Respiratory viral panel negative 3/21 Blood cultures x 2 >> NGTD x 5 days 3/28 R pleural fluid NGTD x 5 days   ANTIBIOTICS: Azithromycin 3/18 > discontinued Ceftriaxone 3/18 >> 3/21 Vanc 3/21 >> 3/21 Unasyn 3/21 > discontinued Cefepime 4/7 > Vanc 4/7 >   SIGNIFICANT EVENTS:  3/18 Admitted 3/20 transfer to ICU and intubated 3/21 refractory shock 3/22 Right PTX > chest tube placed. Pressors stopped 3/28 VATS, right mini thoracotomy, drainage of empyema, decortication, right lower lobectomy. 4/02 Extubated 4/03 Re-admitted to ICU for acute hypercarbic respiratory failure. Improved on BiPAP. 4/07 PCCM asked to see for desatn/mucus plug    SUBJECTIVE : Tolerating CPT and neb meds.  Less SOB but remains on 2L.  Fair cough.  On and off cough.    Vitals:  Vitals:   11/27/16 0339 11/27/16 0805 11/27/16 0818 11/27/16 0819  BP: 139/64 (!) 145/62    Pulse: 70 65    Resp: 20 19    Temp: 98.6 F (37 C) 97.7 F (36.5 C)    TempSrc: Oral Oral    SpO2: 98% 99% 100% 100%  Weight:      Height:        Constitutional/General: chronically ill. Deconditioned. She is not in any distress. Comfortable.   Body mass  index is 24.29 kg/m. Wt Readings from Last 3 Encounters:  11/22/16 64.2 kg (141 lb 8.6 oz)  01/31/16 68.5 kg (151 lb)  11/23/15 69.4 kg (153 lb)    HEENT: PERLA, anicteric sclerae. (-) Oral thrush.   Neck: No masses. Midline trachea. No JVD, (-) LAD. (-) bruits appreciated.  Respiratory/Chest: Grossly normal chest. (-) deformity. (-) Accessory muscle use.  Symmetric expansion. Diminished BS on BLF. Moving more air on R lung.  (+) rhonchi in R lung.  (-) wheezing, crackles (-) egophony   Cardiovascular: Regular rate and  rhythm, heart sounds normal, no murmur or gallops,  Trace peripheral edema  Gastrointestinal:  Normal bowel sounds. Soft, non-tender. No hepatosplenomegaly.  (-) masses.   Musculoskeletal:  Normal muscle tone.   Extremities: Grossly normal. (-) clubbing, cyanosis.  (-) edema  Skin: (-) rash,lesions seen.   Neurological/Psychiatric : Slow, oriented x 3.  CN grossly intact. (-) lateralizing signs.    CBC Recent Labs     11/25/16  0318  11/26/16  0155  WBC  6.0  7.2  HGB  8.6*  9.3*  HCT  27.7*  30.1*  PLT  499*  565*    Coag's No results for input(s): APTT, INR in the last 72 hours.  BMET Recent Labs     11/25/16  0318  11/26/16  0155  NA  137  137  K  4.0  3.9  CL  98*  99*  CO2  29  32  BUN  19  13  CREATININE  0.54  0.51  GLUCOSE  109*  117*    Electrolytes Recent Labs     11/25/16  0318  11/26/16  0155  CALCIUM  8.0*  8.2*    Sepsis Markers No results for input(s): PROCALCITON, O2SATVEN in the last 72 hours.  Invalid input(s): LACTICACIDVEN  ABG No results for input(s): PHART, PCO2ART, PO2ART in the last 72 hours.  Liver Enzymes Recent Labs     11/26/16  0155  AST  18  ALT  28  ALKPHOS  84  BILITOT  0.3  ALBUMIN  1.9*    Cardiac Enzymes No results for input(s): TROPONINI, PROBNP in the last 72 hours.  Glucose Recent Labs     11/25/16  2103  11/26/16  0749  11/26/16  1227  11/26/16  1545  11/26/16   2151  11/27/16  0743  GLUCAP  116*  116*  145*  190*  160*  133*    Imaging Ct Chest Wo Contrast  Result Date: 11/26/2016 CLINICAL DATA:  Right lower lobe surgery for necrotizing pneumonia 11/15/2016. Became short of breath last night with hypoxia. EXAM: CT CHEST WITHOUT CONTRAST TECHNIQUE: Multidetector CT imaging of the chest was performed following the standard protocol without IV contrast. COMPARISON:  CT 11/13/2016, 02/19/2015 and 10/13/2005 as well as recent chest x-ray 11/26/2016. FINDINGS: Cardiovascular: Interval removal of central venous catheter. Mild cardiomegaly with minimal cardiomediastinal shift to the right compatible recent right lower lobectomy. Minimal calcified plaque over the left main and left anterior descending coronary arteries. Mild calcified plaque over the thoracic aorta. Mediastinum/Nodes: 1 cm precarinal lymph node without significant change likely reactive. No hilar adenopathy. Remaining mediastinal structures are unremarkable. Lungs/Pleura: Interval removal of endotracheal tube. Subtle patchy density over the left apex improved. Mild left basilar atelectasis. Surgical sutures over the posteromedial right lower thorax compatible with right lower lobectomy. Moderate-size right pleural effusion. Heterogeneous consolidation over the right mid to upper lung. No pneumothorax. Central right-sided bronchi not well aerated. Upper Abdomen: Unchanged. Musculoskeletal: Mild degenerative change of the spine. IMPRESSION: Postsurgical change compatible recent right lower lobectomy with volume loss of the right lung and cardiomediastinal shift to the right. Moderate-size right effusion tracking to the apex. Heterogeneous consolidation over the right mid to upper lung with decreased aeration of the central bronchi which may be due to persistent infection. Patchy opacification over the left apex improved. Left basilar atelectasis. Mild cardiomegaly. Mild atherosclerotic coronary artery disease.  Aortic atherosclerosis. Electronically Signed   By: Marin Olp M.D.   On: 11/26/2016 17:04   Dg Chest Port 1 View  Result Date: 11/26/2016 CLINICAL DATA:  Respiratory failure. Hx of COPD, partial right lung removed(11/20/16), hypertension, primary spontaneous pneumothorax. Nonsmoker, 2nd hand smoke exposure. EXAM: PORTABLE CHEST 1 VIEW COMPARISON:  Chest x-rays dated 11/25/2016 and 11/24/2016. FINDINGS: There is now complete opacification the right hemithorax, presumably large pleural effusion and/or airspace collapse, favor large pleural effusion given the stable position of the mediastinum. There is stable volume loss on the right that is compatible with history of previous right lower lobe resection. Left lung is clear. Osseous structures about the chest are unremarkable. IMPRESSION: Complete opacification of the right hemithorax, significantly worsened compared to yesterday's exam, favor large pleural effusion. These results will be called to the ordering clinician or representative by the  Psychologist, clinical, and communication documented in the PACS or zVision Dashboard. Electronically Signed   By: Franki Cabot M.D.   On: 11/26/2016 07:33   Dg Chest Port 1 View  Result Date: 11/25/2016 CLINICAL DATA:  Concern for spontaneous pneumothorax. Personal history of spontaneous pneumothorax. Shortness of breath, acute onset. Initial encounter. EXAM: PORTABLE CHEST 1 VIEW COMPARISON:  Chest radiograph performed earlier today at 6:43 a.m. FINDINGS: A small right pleural effusion is suspected, with associated airspace consolidation. This raises concern for pneumonia, new from the prior study. Minimal left basilar opacity likely reflects atelectasis or scarring, stable from the prior study. No pneumothorax is seen. The patient is status post right lower lobectomy. Underlying chronic right pleural thickening is noted. The cardiomediastinal silhouette is mildly enlarged. No acute osseous abnormalities are  identified. IMPRESSION: 1. Small right pleural effusion suspected, with associated airspace consolidation. This raises concern for pneumonia, new from the prior study. Minimal left basilar airspace opacity likely reflects atelectasis or scarring. 2. No evidence of pneumothorax. 3. Mild cardiomegaly. Electronically Signed   By: Garald Balding M.D.   On: 11/25/2016 17:46     ASSESSMENT / PLAN:  Acute on chronic hypoxemic hypercarbic respiratory failure. Improved.  Right lower lobe necrotizing pneumonia, worse based on chest ct scan.  Right empyema status post VATS decortication and right lower lobe lobectomy, POD #11  - Chest ct scan done on 4/8 reviewed.  It shows more consolidation in RML/RLL + ? Loculated pleural effusion posteriorly.  No significant atelectasis or mucus plug seen. She is clinically better with less SOB and less FiO2.  - plan for US guided thoracentesis.  Will order procedure and labs.  Hopefully done today.  If effusion is concerning for empyema/pus, may need to call back [facility].  - cont chest PT q4, mucomyst and duoneb q4 on 4/7 - cont cefepime and vanc for now - DVT prophylaxis - I am holding off on Bronchoscopy.   Plan d/w pt and Tammy Nurse.   Monica Becton, MD 11/27/2016, 10:19 AM [place] [facility] Pager [phone] After 3 pm or if no answer, call [phone]

## 2016-11-27 NOTE — Progress Notes (Signed)
Changed neb treatments and CPT to QID and PRN at this time. Patient told she could call as needed during the night, and she stated that sounds like a good plan. Flutter at bedside to use in between treatments. RT to monior as needed

## 2016-11-27 NOTE — Progress Notes (Signed)
      [address]       [place] [address]             [phone]      Events of weekend noted  She says her breathing is a little better today.  BP (!) 158/57 (BP Location: Right Arm)   Pulse 61   Temp 98.9 F (37.2 C) (Oral)   Resp (!) 23   Ht 5\' 4"  (1.626 m)   Wt 141 lb 8.6 oz (64.2 kg)   SpO2 100%   BMI 24.29 kg/m    Intake/Output Summary (Last 24 hours) at 11/27/16 1318 Last data filed at 11/27/16 0525  Gross per 24 hour  Intake              320 ml  Output              625 ml  Net             -305 ml   Reviewed CXRand CT from 4/8- appears to be primarily atelectasis/ consolidation with a small amount of fluid, which would be expected after a lobecomtomy- also would better explain the time course than a reaccumulating effusion.   She does have a rattling cough but is not bringing up any significant amount of mucous.  Attempted thoracentesis by Radiology today- not enough fluid to aspirate  She is on mucinex and mucomyst.  Will repeat CXR in AM- if not improved aeration on right will need bronch, if Pulmonary cant do tomorrow, I will do on Wednesday  Steven C. Roxan Hockey, MD [facility] [phone]

## 2016-11-27 NOTE — Progress Notes (Signed)
Patient ID: Diana Gardner, female   DOB: March 09, 1951, 66 y.o.   MRN: [id number]                                                                PROGRESS NOTE                                                                                                        Patient Demographics:    Diana Gardner, is a 66 y.o. female, DOB - [birth year], AJG:[id number]  Admit date - 11/05/2016   Admitting Physician Edwin Dada, MD  Outpatient Primary MD for the patient is [name], MD  LOS - 51  Chief Complaint  Patient presents with  . Shortness of Breath       Brief Narrative    66 y.o.femalewith a PMH of COPD, chronic respiratory failure on home oxygen who was admitted 11/05/16 for a right lower lobe necrotizing community-acquired pneumonia complicated by pneumothorax and empyema status post right facets and right lower lobectomy on 11/15/16. She was intubated from 11/07/16 arrow forward 11/20/16 and under the care of the critical care team. She also required pressor support, which was weaned 11/09/16. Care transferred to [facility].   SIGNIFICANT EVENTS:  3/18 Admitted 3/20 transfer to ICU and intubated 3/21 refractory shock 3/22 Right PTX > chest tube placed. Pressors stopped 3/28 VATS, right mini thoracotomy, drainage of empyema, decortication, right lower lobectomy. 4/02 Extubated 4/03 Re-admitted to ICU for acute hypercarbic respiratory failure. Improved on BiPAP. 4/07 PCCM asked to see for desatn/mucus plug    Subjective:   Pt reports feeling better but still with exertional dyspnea and dry cough.    Assessment  & Plan :   R lung Opacification on repeat CXR - CTS reviewed CXR and CT from 4/8, thought to be primarily atelectasis/ consolidation with a small amount of fluid, which would be expected after a lobecomtomy- - IR attempted thoracentesis but unable to perform as not enough fluid to aspirate - plan to repeat CXR in AM- if not improved aeration on right will need bronch,  if Pulmonary cant do tomorrow, I will do on Wednesday  Acute respiratory failure secondary to hcap, vs large right pleural effusion vs mucous plug - continue vancomycin, cefepime day #3 (started on 11/25/2016) - PCCM following   Sepsis/septic shock secondary to necrotizing right lower lung pneumonia/empyema (admission 3/18) - Vanco/Rocephin/zithromax 3/18=>3/21 - Vanco 3/28=>3/29 - Unasyn 3/21=>4/7 - Chest tube 3/22 secondary to right pneumothorax - R VATS, R minithoracotomy with drainage of empyema, decortication, right lower lobectomy 3/28 - Intubated 3/28, Extubated 4/2 - Sputum 3/18 negative - Blood cx 3/18 negative - Respiratory virus panel negative - Urine strep antigen positive - 2-4 week course of unasyn recommended by critical care team but due to Acute respiratory failure 4/7, started Vanco /cefepime 4/7=>  Copd  w exacerbation - Currently on prednisone taper, Duoneb, Pulmicort, Nacetylcysteine - per PCCM  Acute renal failure secondary to nsaid and ace inhibitor - resolved   Hyponatremia  - resolved  Normocytic anemia - stable with no signs of active bleeding - CBC in AM  Hyperlipidemia - Cont lipitor  Hypertension - Cont metoprolol  Steroid induced hyperglycemia - Cont ssi  Thrombocytosis  - From infection - overall stable   Code Status : Full code  Family Communication  : w patient, no family at bedside  Disposition Plan  :  Home eventually  Barriers For Discharge :   Consults  :  pccm, CT surgery  Procedures  :  Chest tube 3/22 secondary to right pneumothorax  2-D echo 11/08/16 estimated ejection fraction was in the range of 65% to 70%. grade 1 DD  11/15/16 PROCEDURE:  BRONCHOSCOPY RIGHT VATS/ MINI THORRACOTOMY DRAINAGE OF EMPYEMA AND DECORTICATION RIGHT LOWER LOBECTOMY  DVT Prophylaxis     Lovenox -  SCDs   Cultures 3/20 Tracheal aspirate >> NGTD x 2 days 3/21 Respiratory viral panel negative 3/21 Blood cultures x 2 >> NGTD x 5  days 3/28 R pleural fluid NGTD x 5 days  Antibiotics  Vanco/Rocephin/zithromax 3/18=>3/21 Vanco 3/28=>3/29 Unasyn 3/21=>4/7 Vanco/cefepime 4/7=>  Anti-infectives    Start     Dose/Rate Route Frequency Ordered Stop   11/26/16 0600  vancomycin (VANCOCIN) IVPB 750 mg/150 ml premix     750 mg 150 mL/hr over 60 Minutes Intravenous Every 12 hours 11/25/16 1810     11/25/16 1830  vancomycin (VANCOCIN) IVPB 1000 mg/200 mL premix     1,000 mg 200 mL/hr over 60 Minutes Intravenous  Once 11/25/16 1810 11/25/16 2100   11/25/16 1830  ceFEPIme (MAXIPIME) 1 g in dextrose 5 % 50 mL IVPB     1 g 100 mL/hr over 30 Minutes Intravenous Every 8 hours 11/25/16 1810     11/16/16 0000  vancomycin (VANCOCIN) IVPB 1000 mg/200 mL premix     1,000 mg 200 mL/hr over 60 Minutes Intravenous Every 12 hours 11/15/16 1711 11/16/16 0100   11/15/16 0715  vancomycin (VANCOCIN) IVPB 1000 mg/200 mL premix     1,000 mg 200 mL/hr over 60 Minutes Intravenous On call to O.R. 11/15/16 0706 11/15/16 1315   11/10/16 0142  Ampicillin-Sulbactam (UNASYN) 3 g in sodium chloride 0.9 % 100 mL IVPB  Status:  Discontinued     3 g 200 mL/hr over 30 Minutes Intravenous Every 8 hours 11/09/16 1809 11/25/16 1758   11/08/16 1700  Ampicillin-Sulbactam (UNASYN) 3 g in sodium chloride 0.9 % 100 mL IVPB  Status:  Discontinued     3 g 200 mL/hr over 30 Minutes Intravenous Every 12 hours 11/08/16 1632 11/09/16 1809   11/07/16 2100  vancomycin (VANCOCIN) IVPB 1000 mg/200 mL premix  Status:  Discontinued     1,000 mg 200 mL/hr over 60 Minutes Intravenous Every 48 hours 11/05/16 2144 11/06/16 0347   11/06/16 2100  cefTRIAXone (ROCEPHIN) 1 g in dextrose 5 % 50 mL IVPB  Status:  Discontinued     1 g 100 mL/hr over 30 Minutes Intravenous Every 24 hours 11/05/16 2326 11/08/16 1609   11/06/16 2100  azithromycin (ZITHROMAX) tablet 500 mg  Status:  Discontinued     500 mg Oral Every 24 hours 11/05/16 2326 11/07/16 1452   11/05/16 2200  azithromycin  (ZITHROMAX) 500 mg in dextrose 5 % 250 mL IVPB     500 mg 250 mL/hr over 60 Minutes  Intravenous  Once 11/05/16 2154 11/05/16 2315   11/05/16 1945  vancomycin (VANCOCIN) IVPB 1000 mg/200 mL premix     1,000 mg 200 mL/hr over 60 Minutes Intravenous  Once 11/05/16 1944 11/05/16 2209   11/05/16 1930  cefTRIAXone (ROCEPHIN) 1 g in dextrose 5 % 50 mL IVPB     1 g 100 mL/hr over 30 Minutes Intravenous  Once 11/05/16 1926 11/05/16 2023   11/05/16 1930  azithromycin (ZITHROMAX) 500 mg in dextrose 5 % 250 mL IVPB  Status:  Discontinued     500 mg 250 mL/hr over 60 Minutes Intravenous  Once 11/05/16 1926 11/05/16 2014        Objective:   Vitals:   11/27/16 0805 11/27/16 0818 11/27/16 0819 11/27/16 1308  BP: (!) 145/62   (!) 158/57  Pulse: 65   61  Resp: 19   (!) 23  Temp: 97.7 F (36.5 C)   98.9 F (37.2 C)  TempSrc: Oral   Oral  SpO2: 99% 100% 100% 100%  Weight:      Height:        Wt Readings from Last 3 Encounters:  11/22/16 64.2 kg (141 lb 8.6 oz)  01/31/16 68.5 kg (151 lb)  11/23/15 69.4 kg (153 lb)     Intake/Output Summary (Last 24 hours) at 11/27/16 1446 Last data filed at 11/27/16 0525  Gross per 24 hour  Intake              320 ml  Output              625 ml  Net             -305 ml    Physical Exam Awake Alert, Oriented X 3, No new F.N deficits, Normal affect [place].AT,PERRAL Supple Neck,No JVD, No cervical lymphadenopathy appriciated.  Symmetrical Chest wall movement, decrease bs about 1/2 up on the right , better air movement in the right upper lung since yesterday RRR,No Gallops,Rubs or new Murmurs, No Parasternal Heave +ve B.Sounds, Abd Soft, No tenderness, No organomegaly appriciated, No rebound - guarding or rigidity. No Cyanosis, Clubbing or edema, No new Rash or bruise     Data Review:    CBC  Recent Labs Lab 11/21/16 0410 11/22/16 0340 11/24/16 0212 11/25/16 0318 11/26/16 0155  WBC 5.9 6.5 6.2 6.0 7.2  HGB 8.0* 8.3* 8.4* 8.6* 9.3*  HCT 26.2*  27.4* 26.9* 27.7* 30.1*  PLT 541* 545* 514* 499* 565*  MCV 89.4 90.7 87.9 88.8 89.3  MCH 27.3 27.5 27.5 27.6 27.6  MCHC 30.5 30.3 31.2 31.0 30.9  RDW 17.6* 17.9* 17.0* 17.1* 17.0*  LYMPHSABS 1.3  --   --   --   --   MONOABS 0.6  --   --   --   --   EOSABS 0.1  --   --   --   --   BASOSABS 0.0  --   --   --   --     Chemistries   Recent Labs Lab 11/21/16 0410 11/22/16 0340 11/23/16 0249 11/24/16 0212 11/25/16 0318 11/26/16 0155  NA 141 140 137 135 137 137  K 3.0* 4.6 4.1 3.6 4.0 3.9  CL 99* 102 97* 95* 98* 99*  CO2 38* 34* 34* 32 29 32  GLUCOSE 150* 97 162* 122* 109* 117*  BUN 27* 22* 23* 20 19 13   CREATININE 0.59 0.61 0.73 0.61 0.54 0.51  CALCIUM 8.0* 8.0* 7.8* 7.9* 8.0* 8.2*  MG 1.9 2.0  --   --   --   --  AST  --   --   --   --   --  18  ALT  --   --   --   --   --  28  ALKPHOS  --   --   --   --   --  84  BILITOT  --   --   --   --   --  0.3    Inpatient Medications  Scheduled Meds: . acetylcysteine  4 mL Nebulization Q4H  . atorvastatin  20 mg Oral Daily  . budesonide (PULMICORT) nebulizer solution  0.25 mg Nebulization BID  . ceFEPime (MAXIPIME) IV  1 g Intravenous Q8H  . chlorhexidine  15 mL Mouth Rinse BID  . enoxaparin (LOVENOX) injection  40 mg Subcutaneous Q24H  . feeding supplement (ENSURE ENLIVE)  237 mL Oral BID BM  . insulin aspart  0-9 Units Subcutaneous TID WC  . ipratropium-albuterol  3 mL Nebulization Q4H  . mouth rinse  15 mL Mouth Rinse q12n4p  . metoprolol tartrate  25 mg Oral BID  . pantoprazole  40 mg Oral Daily  . predniSONE  10 mg Oral Q breakfast   Followed by  . [START ON 11/30/2016] predniSONE  5 mg Oral Q breakfast  . saccharomyces boulardii  250 mg Oral BID  . vancomycin  750 mg Intravenous Q12H   Continuous Infusions: PRN Meds:.albuterol, fentaNYL (SUBLIMAZE) injection, Gerhardt's butt cream, labetalol, ondansetron (ZOFRAN) IV  Micro Results Recent Results (from the past 240 hour(s))  C difficile quick scan w PCR reflex      Status: None   Collection Time: 11/25/16 12:46 PM  Result Value Ref Range Status   C Diff antigen NEGATIVE NEGATIVE Final   C Diff toxin NEGATIVE NEGATIVE Final   C Diff interpretation No C. difficile detected.  Final   Radiology Reports Dg Chest 2 View  Result Date: 11/24/2016 CLINICAL DATA:  Recent right lower lobectomy for empyema. Currently with cough EXAM: CHEST  2 VIEW COMPARISON:  November 23, 2016 FINDINGS: There has been partial clearing of airspace consolidation from the lung bases compared to 1 day prior. There remains patchy bibasilar atelectasis with small pleural effusions bilaterally. Lungs elsewhere are clear. Note that there is volume loss on the right consistent with previous surgery. Heart size is normal. The pulmonary vascular is within normal limits. No adenopathy. No bone lesions. IMPRESSION: Bibasilar atelectasis with small pleural effusions. No consolidation. Stable volume loss on the right. No new opacity. Stable cardiac silhouette. No evident pneumothorax. Electronically Signed   By: Lowella Grip III M.D.   On: 11/24/2016 08:38   Dg Chest 2 View  Result Date: 11/05/2016 CLINICAL DATA:  Shortness of breath and cough. EXAM: CHEST  2 VIEW COMPARISON:  August 28, 2015 FINDINGS: The cardiomediastinal silhouette is stable. No pneumothorax. The left lung is clear. New effusion and underlying opacity seen on the right. IMPRESSION: New effusion and underlying opacity seen in the right lower lobe. No other interval changes. Recommend follow-up to resolution. Electronically Signed   By: Dorise Bullion III M.D   On: 11/05/2016 19:03   Ct Chest Wo Contrast  Result Date: 11/26/2016 CLINICAL DATA:  Right lower lobe surgery for necrotizing pneumonia 11/15/2016. Became short of breath last night with hypoxia. EXAM: CT CHEST WITHOUT CONTRAST TECHNIQUE: Multidetector CT imaging of the chest was performed following the standard protocol without IV contrast. COMPARISON:  CT 11/13/2016,  02/19/2015 and 10/13/2005 as well as recent chest x-ray 11/26/2016. FINDINGS: Cardiovascular:  Interval removal of central venous catheter. Mild cardiomegaly with minimal cardiomediastinal shift to the right compatible recent right lower lobectomy. Minimal calcified plaque over the left main and left anterior descending coronary arteries. Mild calcified plaque over the thoracic aorta. Mediastinum/Nodes: 1 cm precarinal lymph node without significant change likely reactive. No hilar adenopathy. Remaining mediastinal structures are unremarkable. Lungs/Pleura: Interval removal of endotracheal tube. Subtle patchy density over the left apex improved. Mild left basilar atelectasis. Surgical sutures over the posteromedial right lower thorax compatible with right lower lobectomy. Moderate-size right pleural effusion. Heterogeneous consolidation over the right mid to upper lung. No pneumothorax. Central right-sided bronchi not well aerated. Upper Abdomen: Unchanged. Musculoskeletal: Mild degenerative change of the spine. IMPRESSION: Postsurgical change compatible recent right lower lobectomy with volume loss of the right lung and cardiomediastinal shift to the right. Moderate-size right effusion tracking to the apex. Heterogeneous consolidation over the right mid to upper lung with decreased aeration of the central bronchi which may be due to persistent infection. Patchy opacification over the left apex improved. Left basilar atelectasis. Mild cardiomegaly. Mild atherosclerotic coronary artery disease. Aortic atherosclerosis. Electronically Signed   By: Marin Olp M.D.   On: 11/26/2016 17:04   Ct Chest Wo Contrast  Result Date: 11/13/2016 CLINICAL DATA:  66 year old female with history of loculated pleural effusion with chest tube in place. EXAM: CT CHEST WITHOUT CONTRAST TECHNIQUE: Multidetector CT imaging of the chest was performed following the standard protocol without IV contrast. COMPARISON:  Chest CT 02/19/2015.  Multiple recent prior chest radiographs. FINDINGS: Cardiovascular: Heart size is normal. There is no significant pericardial fluid, thickening or pericardial calcification. There is aortic atherosclerosis, as well as atherosclerosis of the great vessels of the mediastinum and the coronary arteries, including calcified atherosclerotic plaque in the left main and left anterior descending coronary arteries. Left internal jugular central venous catheter with tip terminating in the superior aspect of the right atrium. Mediastinum/Nodes: Patient is intubated, with the tip of the endotracheal to the lying approximately 2.1 cm above the carina. Nasogastric tube extends into the stomach. Numerous borderline enlarged and mildly enlarged mediastinal and bilateral hilar lymph nodes, measuring up to 11 mm in short axis in the subcarinal nodal station. Esophagus is unremarkable in appearance. No axillary lymphadenopathy. Lungs/Pleura: Right-sided chest tube in position with tip directed into the medial aspect of the apex of the right hemithorax. Large complex right-sided pleural fluid and gas collection. Fluid ranges from low to intermediate attenuation, suggesting proteinaceous contents. Some of the pneumothorax component is confluent anteriorly, while other locules of gas are noted throughout the lower right hemithorax, some of which appear to be within collapsed/consolidated lung tissue, but other small locules of gas likely are present within loculated pleural fluid in the lower right hemithorax. In the medial aspect of the lower right hemithorax there is what appears to be a thick-walled cavitary area, which is favored to be within the lung parenchyma rather than in the pleural space, measuring up to 6.4 x 2.4 x 6.5 cm (axial image 83 of series 201, and coronal image 56 of series 203). Areas of bronchiectasis are noted within the right lower lobe. Within the aerated portions of the lungs there is patchy multifocal  ground-glass attenuation and septal thickening. The ground-glass attenuation is most evident in a peribronchovascular distribution, likely infectious/inflammatory in etiology, most severe in the left upper lobe. No significant left pleural effusion. Upper Abdomen: Unremarkable. Musculoskeletal: Extensive emphysema is noted throughout the subcutaneous fat of the chest wall bilaterally extending  up into the cervical regions bilaterally (right greater than left). There are no aggressive appearing lytic or blastic lesions noted in the visualized portions of the skeleton. IMPRESSION: 1. Findings are most compatible with right lower lobe necrotizing pneumonia, with what appears to be a large right lower lobe cavity in the medial aspect of the basal segments of the right lower lobe, as well as a large right empyema. In this complex right pleural gas and fluid collections there both a small anterior pneumothorax, as well as multifocal loculated components of gas within the inferior aspect of the right pleural space, as above. Right-sided chest tube is directed into the medial aspect of the right apex, and the tip of the chest tube is currently surrounded by very little pleural fluid or gas. 2. Multifocal patchy interstitial and airspace disease asymmetrically distributed throughout the remaining aerated portions of the lungs, favored to reflect severe multilobar bronchopneumonia. 3. Aortic atherosclerosis, in addition to left main and left anterior descending coronary artery disease. Please note that although the presence of coronary artery calcium documents the presence of coronary artery disease, the severity of this disease and any potential stenosis cannot be assessed on this non-gated CT examination. Assessment for potential risk factor modification, dietary therapy or pharmacologic therapy may be warranted, if clinically indicated. 4. Support apparatus, as above. Electronically Signed   By: Vinnie Langton M.D.   On:  11/13/2016 15:19   US Renal  Result Date: 11/06/2016 CLINICAL DATA:  Sepsis EXAM: RENAL / URINARY TRACT ULTRASOUND COMPLETE COMPARISON:  None. FINDINGS: Right Kidney: Length: 9.4 cm, within normal limits. Echogenicity within normal limits. No mass or hydronephrosis visualized. Left Kidney: Length: 10.9 cm, within normal limits. Echogenicity within normal limits. No mass or hydronephrosis visualized. Bladder: Appears normal for degree of bladder distention. IMPRESSION: Negative bilateral renal ultrasound. Electronically Signed   By: San Morelle M.D.   On: 11/06/2016 12:41   Dg Chest Port 1 View  Result Date: 11/26/2016 CLINICAL DATA:  Respiratory failure. Hx of COPD, partial right lung removed(11/20/16), hypertension, primary spontaneous pneumothorax. Nonsmoker, 2nd hand smoke exposure. EXAM: PORTABLE CHEST 1 VIEW COMPARISON:  Chest x-rays dated 11/25/2016 and 11/24/2016. FINDINGS: There is now complete opacification the right hemithorax, presumably large pleural effusion and/or airspace collapse, favor large pleural effusion given the stable position of the mediastinum. There is stable volume loss on the right that is compatible with history of previous right lower lobe resection. Left lung is clear. Osseous structures about the chest are unremarkable. IMPRESSION: Complete opacification of the right hemithorax, significantly worsened compared to yesterday's exam, favor large pleural effusion. These results will be called to the ordering clinician or representative by the Radiologist Assistant, and communication documented in the PACS or zVision Dashboard. Electronically Signed   By: Franki Cabot M.D.   On: 11/26/2016 07:33   Dg Chest Port 1 View  Result Date: 11/25/2016 CLINICAL DATA:  Concern for spontaneous pneumothorax. Personal history of spontaneous pneumothorax. Shortness of breath, acute onset. Initial encounter. EXAM: PORTABLE CHEST 1 VIEW COMPARISON:  Chest radiograph performed earlier  today at 6:43 a.m. FINDINGS: A small right pleural effusion is suspected, with associated airspace consolidation. This raises concern for pneumonia, new from the prior study. Minimal left basilar opacity likely reflects atelectasis or scarring, stable from the prior study. No pneumothorax is seen. The patient is status post right lower lobectomy. Underlying chronic right pleural thickening is noted. The cardiomediastinal silhouette is mildly enlarged. No acute osseous abnormalities are identified. IMPRESSION: 1. Small  right pleural effusion suspected, with associated airspace consolidation. This raises concern for pneumonia, new from the prior study. Minimal left basilar airspace opacity likely reflects atelectasis or scarring. 2. No evidence of pneumothorax. 3. Mild cardiomegaly. Electronically Signed   By: Garald Balding M.D.   On: 11/25/2016 17:46   Dg Chest Port 1 View  Result Date: 11/25/2016 CLINICAL DATA:  Respiratory failure EXAM: PORTABLE CHEST 1 VIEW COMPARISON:  Yesterday FINDINGS: Streaky densities at the bases, right more than left. Asymmetric elevation of the right diaphragm, increased. There is right pleural thickening reaching the apex. Borderline cardiomegaly, accentuated by technique. Small left effusions based on lateral film yesterday. No Kerley lines or pneumothorax. IMPRESSION: 1. Mildly increased atelectasis at the right base. 2. Right pleural thickening and lower lobectomy. Electronically Signed   By: Monte Fantasia M.D.   On: 11/25/2016 08:32   Dg Chest Port 1 View  Result Date: 11/23/2016 CLINICAL DATA:  CHEST TUBE REMOVAL/RIGHT EXAM: PORTABLE CHEST 1 VIEW COMPARISON:  11/23/2016 FINDINGS: Right chest tube has been removed. No pneumothorax. Heart size is accentuated by the AP portable technique. There are patchy densities in the lung bases bilaterally which partially obscure the hemidiaphragms, increased on the left. Opacity at the left lung base now obscures the hemidiaphragm. No  pulmonary edema. IMPRESSION: Bilateral lower lobe opacities, increased on the left. No pneumothorax following removal of chest tube. Electronically Signed   By: Nolon Nations M.D.   On: 11/23/2016 12:53   Dg Chest Port 1 View  Result Date: 11/23/2016 CLINICAL DATA:  Shortness of breath.  Pleural effusion . EXAM: PORTABLE CHEST 1 VIEW COMPARISON:  11/22/2016.  11/21/2016 . FINDINGS: Right chest tube in stable position. No pneumothorax. Stable cardiomegaly. Interim improvement of pulmonary interstitial prominence suggesting improvement of pulmonary interstitial edema. Tiny bilateral pleural effusions. Stable biapical pleural thickening most likely secondary scarring . Prominent skin folds noted over the chest. IMPRESSION: 1. Right chest tube in stable position.  No pneumothorax. 2. Stable cardiomegaly. Interim improvement of pulmonary interstitial prominence suggesting improving interstitial edema. Tiny bilateral pleural effusions again noted. Electronically Signed   By: Marcello Moores  Register   On: 11/23/2016 07:34   Dg Chest Port 1 View  Result Date: 11/22/2016 CLINICAL DATA:  66 y/o  F; increasing shortness of breath. EXAM: PORTABLE CHEST 1 VIEW COMPARISON:  11/21/2016 chest radiograph. FINDINGS: Stable cardiac silhouette given projection and technique. Aortic atherosclerosis with calcification. Stable position of right-sided chest tube. Increase interstitial opacities in the lungs bilaterally probably represents developing interstitial pulmonary edema. Increasing opacity of the right lung base probably represents worsening pleural effusion and atelectasis. No acute osseous abnormality identified. IMPRESSION: Increase interstitial opacities probably representing developing edema. Increasing opacity of the right lung and right lung base, likely increasing pleural effusion. No appreciable pneumothorax. Electronically Signed   By: Kristine Garbe M.D.   On: 11/22/2016 05:29   Dg Chest Port 1  View  Result Date: 11/21/2016 CLINICAL DATA:  History of pneumothorax.  Chest tube removal. EXAM: PORTABLE CHEST 1 VIEW COMPARISON:  11/20/2016. FINDINGS: Interim extubation. Interim removal of the lateral most chest tube. Two medial most chest tube is in stable position. Left IJ line in stable position . Heart size stable. Right base subsegmental atelectasis. Perihilar atelectasis/infiltrate. Small bilateral pleural effusions. No pneumothorax. IMPRESSION: 1. Interim extubation. Interim removal of lateral most chest tube. Two medial most chest tubes in stable position. Left IJ line stable position. No pneumothorax. 2. Persistent right base subsegmental atelectasis. Mild left perihilar atelectasis/ infiltrate  noted on today's exam. Small bilateral pleural effusions. Electronically Signed   By: Marcello Moores  Register   On: 11/21/2016 06:45   Dg Chest Port 1 View  Result Date: 11/20/2016 CLINICAL DATA:  Status post thoracoscopy and empyema drainage with decortication on the right. EXAM: PORTABLE CHEST 1 VIEW COMPARISON:  Portable chest x-ray of November 19, 2016 FINDINGS: There remains mild volume loss on the right. There is no pneumothorax. The right-sided chest tube tip projects over the posterior aspect of the fourth rib. A second more medially positioned chest tube has its tip approximately 2 cm lateral to the hands. A third chest tube has its tip projecting over the medial aspect of the ninth rib. The left lung is well-expanded. Minimal basilar atelectasis is suspected on the left. The cardiac silhouette is top-normal in size. The pulmonary vascularity is not engorged. The endotracheal tube tip lies approximately 3.7 cm above the carina. The esophagogastric tube tip projects below the inferior margin of the image. The left internal jugular venous catheter tip projects over the distal third of the SVC. IMPRESSION: Fairly stable appearance of the chest. No right-sided pneumothorax or significant pleural effusion. Minimal  postsurgical volume loss due to lower lobectomy. Minimal left basilar atelectasis. The support tubes are in reasonable position. Electronically Signed   By: David  Martinique M.D.   On: 11/20/2016 07:08   Dg Chest Port 1 View  Result Date: 11/19/2016 CLINICAL DATA:  Pneumonia EXAM: PORTABLE CHEST 1 VIEW COMPARISON:  11/18/2016 FINDINGS: Three chest tubes on the right unchanged. No pneumothorax. Minimal right effusion unchanged. Mild bibasilar airspace disease unchanged Endotracheal tube 2.5 cm above the carina. Left jugular central venous catheter tip in the right atrium. NG tube enters the stomach. IMPRESSION: No change from yesterday. Electronically Signed   By: Franchot Gallo M.D.   On: 11/19/2016 07:07   Dg Chest Port 1 View  Result Date: 11/18/2016 CLINICAL DATA:  Evaluate ETT. EXAM: PORTABLE CHEST 1 VIEW COMPARISON:  November 17, 2016 FINDINGS: The ETT is in good position. Right-sided chest tubes remain, in good position. The enteric tube terminates below today's study but the side port is below the GE junction within the stomach. No pneumothorax. The cardiomediastinal silhouette is stable. Small bilateral pleural effusions with underlying atelectasis, unchanged. A left-sided central line terminates near the caval atrial junction. It is possible it could extend into the right atrium. This appears to be unchanged in the interval. No overt edema. IMPRESSION: 1. Stable support apparatus. The distal tip of the right central line is near the caval atrial junction, either within the distal SVC or just within the right atrium. 2. Small bilateral effusions with underlying atelectasis remain. Electronically Signed   By: Dorise Bullion III M.D   On: 11/18/2016 07:26   Dg Chest Port 1 View  Result Date: 11/17/2016 CLINICAL DATA:  Chest 2.  Change in respirations and breath sounds EXAM: PORTABLE CHEST 1 VIEW COMPARISON:  11/17/2016 FINDINGS: Right chest tube, endotracheal tube, NG tube and left central line remain in  place, unchanged. Bibasilar atelectasis is stable. No pneumothorax. Probable small effusions. IMPRESSION: No pneumothorax. Continued bibasilar atelectasis and small effusions. Electronically Signed   By: Rolm Baptise M.D.   On: 11/17/2016 12:57   Dg Chest Port 1 View  Result Date: 11/17/2016 CLINICAL DATA:  Respiratory failure EXAM: PORTABLE CHEST 1 VIEW COMPARISON:  11/16/2016 FINDINGS: Endotracheal tube, nasogastric catheter and left jugular central line are again seen and stable. Chest tubes are noted on the right and  stable. No pneumothorax is seen. Bibasilar atelectatic changes are seen. No bony abnormality is noted. IMPRESSION: Tubes and lines as described.  No pneumothorax is noted. Stable bibasilar changes. Electronically Signed   By: Inez Catalina M.D.   On: 11/17/2016 07:32   Dg Chest Port 1 View  Result Date: 11/16/2016 CLINICAL DATA:  Acute respiratory failure EXAM: PORTABLE CHEST 1 VIEW COMPARISON:  11/15/2016 FINDINGS: Cardiac shadow remains mildly enlarged. Endotracheal tube, nasogastric catheter and left jugular central line are again seen. Multiple right chest tubes are again noted and stable. No definitive pneumothorax is seen. Bibasilar infiltrative changes are noted right greater than left. The changes on the left has increased slightly in the interval from the prior exam. IMPRESSION: Postsurgical changes. No pneumothorax is seen. Bibasilar infiltrative changes are seen. Electronically Signed   By: Inez Catalina M.D.   On: 11/16/2016 07:34   Dg Chest Port 1 View  Result Date: 11/15/2016 CLINICAL DATA:  Right lower lobe lobectomy for pulmonary abscess/empyema EXAM: PORTABLE CHEST 1 VIEW COMPARISON:  11/15/2016 FINDINGS: The heart size and mediastinal contours are within normal limits. Endotracheal tube tip is slightly low lying at 2.4 cm above the carina. Pullback approximately 1 cm recommended. Left IJ central line catheter is noted at the cavoatrial junction. Two right-sided chest  tubes are identified projecting over the lung apex with decrease in previously noted loculated pleural opacity along the periphery of the right hemithorax. Bibasilar atelectasis is identified. No significant pneumothorax. Gastric tube extends below the left hemidiaphragm into the expected location the stomach. The tip is excluded however. No acute osseous abnormality appear IMPRESSION: 1. Slightly low-lying endotracheal tube tip approximately 2.4 cm above the carina. Pullback approximately 1 cm recommended. Otherwise, satisfactory support line and tube positions. These results will be called to the ordering clinician or representative by the Radiologist Assistant, and communication documented in the PACS or zVision Dashboard. 2. Residual atelectasis at the right lung base without appreciable pneumothorax status post right lower lobectomy. Electronically Signed   By: Ashley Royalty M.D.   On: 11/15/2016 19:57   Dg Chest Port 1 View  Result Date: 11/15/2016 CLINICAL DATA:  Respiratory failure. EXAM: PORTABLE CHEST 1 VIEW COMPARISON:  11/14/2016 CT 11/13/2016 . FINDINGS: Endotracheal tube, NG tube, left IJ line, right chest tube in stable position. No definite pneumothorax identified. Previously identified anterior pneumothorax best identified on CT of 11/13/2016. Persistent basilar infiltrates, particularly prominent on the right. Persistent prominent right sided pleural effusions/possible empyema again noted. No change. IMPRESSION: 1. Lines and tubes including right chest tube in stable position. No pneumothorax identified. Previously identified anterior pneumothorax best demonstrated by prior CT of 11/13/2016. 2. Persistent loculated right pleural effusion/empyema. No acute change. Persistent basilar infiltrates, particular prominent on the right. No change. Electronically Signed   By: Marcello Moores  Register   On: 11/15/2016 06:46   Dg Chest Port 1 View  Result Date: 11/14/2016 CLINICAL DATA:  Respiratory failure.  EXAM: PORTABLE CHEST 1 VIEW COMPARISON:  CT 11/13/2016.  Chest x-ray 11/13/2016. FINDINGS: Endotracheal tube, NG tube, left IJ line, right chest tube stable position. Heart size stable. Bilateral pulmonary infiltrates with right side pleural effusion/ possible empyema again noted. Previously identified anterior pneumothorax is best demonstrated by CT of 11/13/2016. Mild chest wall subcutaneous emphysema again noted. IMPRESSION: 1. Lines and tubes including right chest tube in stable position. 2. Persistent bibasilar infiltrates. Persistent right sided prominent pleural effusion/ possible empyema again noted without significant change. Previously identified anterior pneumothorax best demonstrated about recent  CT. No definite pneumothorax noted on this chest x-ray. Electronically Signed   By: Marcello Moores  Register   On: 11/14/2016 07:04   Dg Chest Port 1 View  Result Date: 11/13/2016 CLINICAL DATA:  Pneumothorax.  Shortness of breath . EXAM: PORTABLE CHEST 1 VIEW COMPARISON:  11/12/2016. FINDINGS: Endotracheal tube, left IJ line, NG tube, right chest tube in stable position. No pneumothorax. Persistent right side pleural effusion with increase in size from prior exam. Right base atelectasis. Heart size normal. Subcutaneous emphysema has improved. IMPRESSION: 1. Lines and tubes is including right chest tube in stable position. No pneumothorax. 2.  Interim increase in right pleural effusion. Electronically Signed   By: Marcello Moores  Register   On: 11/13/2016 06:47   Dg Chest Port 1 View  Result Date: 11/12/2016 CLINICAL DATA:  Patient with history of pneumothorax. EXAM: PORTABLE CHEST 1 VIEW COMPARISON:  Chest radiograph 11/11/2016 FINDINGS: ET tube terminates in the mid trachea. Central venous catheter tip projects over the right atrium. Monitoring leads overlie the patient. Right-sided chest tube projects over the right lung apex. Stable cardiac and mediastinal contours. Persistent heterogeneous opacities right mid lower  lung. Heterogeneous opacities left lung base. Moderate right pleural effusion. No definite pneumothorax. Subcutaneous emphysema. IMPRESSION: Stable support apparatus. Persistent moderate right pleural effusion with underlying opacities. No visible right-sided pneumothorax. Electronically Signed   By: Lovey Newcomer M.D.   On: 11/12/2016 08:40   Dg Chest Port 1 View  Result Date: 11/11/2016 CLINICAL DATA:  Continued surveillance pneumothorax. EXAM: PORTABLE CHEST 1 VIEW COMPARISON:  11/10/2016. FINDINGS: Unchanged cardiomediastinal silhouette and support apparatus. RIGHT chest tube remains in good position in the RIGHT lung apex. Visualized pneumothorax is less, although RIGHT pleural effusion is increased. BILATERAL pulmonary opacities appear worse. Subcutaneous emphysema redemonstrated. IMPRESSION: No visible pneumothorax.  Increasing RIGHT effusion. Electronically Signed   By: Staci Righter M.D.   On: 11/11/2016 07:51   Dg Chest Port 1 View  Result Date: 11/10/2016 CLINICAL DATA:  Pneumothorax.  Chest tube. EXAM: PORTABLE CHEST 1 VIEW COMPARISON:  Chest x-ray 11/10/2016. FINDINGS: Endotracheal tube, left IJ line, NG tube, right chest tube in stable position. Heart size normal. Bilateral pulmonary infiltrates. Small right pneumothorax noted on today's exam. Diffuse chest wall subcutaneous emphysema again noted. IMPRESSION: 1. Lines and tubes including right chest tube in stable position. Small right pneumothorax noted on today's exam. Diffuse chest wall subcutaneous emphysema noted. 2. Diffuse bilateral pulmonary infiltrates. Critical Value/emergent results were called by telephone at the time of interpretation on 11/10/2016 at 3:17 pm to nurse Anderson Malta, who verbally acknowledged these results. Electronically Signed   By: Marcello Moores  Register   On: 11/10/2016 15:19   Dg Chest Port 1 View  Result Date: 11/10/2016 CLINICAL DATA:  Respiratory failure. EXAM: PORTABLE CHEST 1 VIEW COMPARISON:  11/09/2016. FINDINGS:  Endotracheal tube, NG tube, left IJ line in stable position . Right chest tube in stable position. No pneumothorax. Diffuse bilateral chest wall and neck subcutaneous emphysema. Subcutaneous emphysema has increased significantly from prior exam. IMPRESSION: 1. Lines and tubes in stable position. Right chest tube in stable position. No pneumothorax. 2. Persistent bilateral pulmonary infiltrates, right greater than left. Small right pleural effusion. No interim change. 3. Interim significant progression of diffuse bilateral chest wall and bilateral neck subcutaneous emphysema. Electronically Signed   By: Marcello Moores  Register   On: 11/10/2016 07:13   Dg Chest Port 1 View  Result Date: 11/09/2016 CLINICAL DATA:  Post right chest tube placement EXAM: PORTABLE CHEST 1 VIEW COMPARISON:  11/09/2016 FINDINGS: Cardiomediastinal silhouette is stable. Persistent bilateral basilar infiltrates right greater than left. Small right pleural effusion. There is right chest tube in place with tip in right apex. No definite pneumothorax. Subcutaneous emphysema noted right axilla. Stable endotracheal and NG tube position. Stable left IJ central line position. IMPRESSION: Stable support apparatus. Persistent bilateral basilar infiltrates right greater than left. Small right pleural effusion. There is right chest tube in place with tip in right apex. No definite pneumothorax. Subcutaneous emphysema noted right axilla. Electronically Signed   By: Lahoma Crocker M.D.   On: 11/09/2016 10:27   Dg Chest Port 1 View  Result Date: 11/09/2016 CLINICAL DATA:  Respiratory failure EXAM: PORTABLE CHEST 1 VIEW COMPARISON:  11/08/2016 FINDINGS: Endotracheal tube, nasogastric catheter and left jugular central line are again seen and stable. Cardiac shadow is stable. Bibasilar infiltrates and effusions are seen slightly worse on the right than the left. New left upper lobe infiltrative changes are noted. A new right-sided pneumothorax is noted with  approximately 2 cm excursion at the apex at and 1 cm excursion laterally. IMPRESSION: New right pneumothorax. Bibasilar infiltrates as well as new left upper lobe infiltrate are seen. Effusions are seen right greater than left. Critical Value/emergent results were called by telephone at the time of interpretation on 11/09/2016 at 7:04 am to [facility], the pts nurse, who verbally acknowledged these results and will contact primary physician. Electronically Signed   By: Inez Catalina M.D.   On: 11/09/2016 07:05   Portable Chest Xray  Result Date: 11/08/2016 CLINICAL DATA:  Hypoxia EXAM: PORTABLE CHEST 1 VIEW COMPARISON:  November 07, 2016 FINDINGS: Endotracheal tube tip is 1.5 cm above the carina. Nasogastric tube tip and side port are below the diaphragm. Central catheter tip is at the cavoatrial junction. No pneumothorax. There is airspace consolidation in both lower lobes, not significantly changed from 1 day prior. There are small pleural effusions bilaterally. Heart size and pulmonary vascularity are normal. There is atherosclerotic calcification in the aorta. No adenopathy. No bone lesions. IMPRESSION: Tube and catheter positions as described without evident pneumothorax. Airspace opacity suspicious for pneumonia in both lower lobes. Small pleural effusions bilaterally. No interstitial edema appreciable. Heart size normal. There is aortic atherosclerosis. Electronically Signed   By: Lowella Grip III M.D.   On: 11/08/2016 07:41   Dg Chest Port 1 View  Result Date: 11/07/2016 CLINICAL DATA:  Central line placement. EXAM: PORTABLE CHEST 1 VIEW COMPARISON:  Earlier today at 1412 hours FINDINGS: 1527 hours. Left internal jugular line is difficult to follow centrally secondary to overlying wires and leads. Terminates at least at the level of the high right atrium. Endotracheal and nasogastric tubes are unchanged. Normal heart size. Atherosclerosis in the transverse aorta. Small left pleural effusion is unchanged.  No pneumothorax. Bibasilar airspace disease is not significantly changed. Mild interstitial edema. IMPRESSION: Left internal jugular line is difficult to follow centrally secondary to overlying wires and leads. Followed to at least the level of the high right atrium. Consider retraction 2-3 cm with repeat imaging (ideally after removing EKG leads and wires). Otherwise, similar appearance of the chest with bibasilar airspace disease, left pleural fluid, and interstitial edema. Electronically Signed   By: Abigail Miyamoto M.D.   On: 11/07/2016 15:56   Dg Chest Port 1 View  Result Date: 11/07/2016 CLINICAL DATA:  Endotracheal tube EXAM: PORTABLE CHEST 1 VIEW COMPARISON:  03/09/2017 FINDINGS: Small bilateral pleural effusions. Right lower lobe airspace disease. No pneumothorax. Stable cardiomediastinal silhouette. Endotracheal to  with the tip 3.5 cm above the carina. Nasogastric tube coursing below the diaphragm. IMPRESSION: 1. Small bilateral pleural effusions. Right lower lobe airspace disease concerning for pneumonia. 2. Small bilateral pleural effusions. Electronically Signed   By: Kathreen Devoid   On: 11/07/2016 14:29   Dg Chest Port 1 View  Result Date: 11/07/2016 CLINICAL DATA:  Pleural effusion EXAM: PORTABLE CHEST 1 VIEW COMPARISON:  Chest radiograph 11/05/2016 FINDINGS: Unchanged cardiomediastinal contours. Small right pleural effusion and associated basilar consolidation is unchanged. The left lung is clear. IMPRESSION: Unchanged small right pleural effusion and right basilar consolidation. Electronically Signed   By: Ulyses Jarred M.D.   On: 11/07/2016 02:43   Dg Abd Portable 1v  Result Date: 11/07/2016 CLINICAL DATA:  Enteric tube placement EXAM: PORTABLE ABDOMEN - 1 VIEW COMPARISON:  None. FINDINGS: Enteric tube terminates in the distal body of the stomach. No disproportionately dilated small bowel loops. Mild colonic stool volume. No evidence of pneumatosis or pneumoperitoneum. Patchy bibasilar lung  opacities. IMPRESSION: 1. Enteric tube terminates in the distal body of the stomach. 2. Nonobstructive bowel gas pattern. 3. Nonspecific patchy bibasilar lung opacities, correlate with chest radiograph. Electronically Signed   By: Ilona Sorrel M.D.   On: 11/07/2016 14:29   Ir US Chest  Result Date: 11/27/2016 CLINICAL DATA:  Patient with recent VATS and chest tube on the right side. Recent CT scan concerning for possible pleural effusion. Request is made for thoracentesis. EXAM: CHEST ULTRASOUND COMPARISON:  None. FINDINGS: Small area concerning for either lung consolidation or fluid with sedimentation present within the right inferior chest. IMPRESSION: Small area concerning for either lung consolidation or fluid with sedimentation within the right inferior chest cavity. This was confirmed with Dr. Barbie Banner. Thoracentesis was deferred. If the patient's kidney function is normal and this needs to be further evaluated, he would recommend a CT scan of the chest with contrast as the most recent was without contrast. Read by: Saverio Danker, PA-C Electronically Signed   By: Marybelle Killings M.D.   On: 11/27/2016 13:29   Ir US Chest  Result Date: 11/06/2016 CLINICAL DATA:  Patient admitted with right-sided pneumonia. Recent imaging suggested possible right-sided pleural effusion. Request is made for thoracentesis. EXAM: CHEST ULTRASOUND COMPARISON:  None. FINDINGS: Minimal right-sided effusion. Most of what is seen is consolidation of the right lung secondary to pneumonia. IMPRESSION: Unable to perform thoracentesis secondary to minimal effusion. Read by: Saverio Danker, PA-C Electronically Signed   By: Aletta Edouard M.D.   On: 11/06/2016 11:57   Time Spent in minutes  30 min, critical care time  Faye Ramsay M.D on 11/27/2016 at 2:46 PM  Between 7am to 7pm - Pager - [phone]  After 7pm go to [email or web address] - password [facility]  [facility] -  Office  [phone]

## 2016-11-27 NOTE — Progress Notes (Signed)
PT Cancellation Note  Patient Details Name: DESARAE PLACIDE MRN: [id number] DOB: 1951-07-23   Cancelled Treatment:    Reason Eval/Treat Not Completed: Medical issues which prohibited therapy (Pt going for thoracentesis this am per nurse.  HOLD today. )Will check back tomorrow.    Godfrey Pick  11/27/2016, 10:44 AM [facility] [phone] (pager)

## 2016-11-27 NOTE — Progress Notes (Signed)
   [facility] PCCM  US done and preliminary report shows thickened pleural fluid vs consolidation per IR.  We will ask [facility] to follow up and weigh in on the recent chest ct scan findings.   Likely will need to continue abx for now but I am not sure if there is a role for rpt decortication or small chest tube with tPA and pulmozyme.   My sense is just to cont with abx unless she worsens clinically.    Monica Becton, MD 11/27/2016, 12:35 PM [facility] Pager [phone] After 3 pm or if no answer, call [phone]

## 2016-11-27 NOTE — Progress Notes (Signed)
Note enough fluid seen on US of the right chest to perform thoracentesis.  What was present was unable to be identified as either consolidated lung or fluid with loculations/sediment.  Dr. Barbie Banner reviewed the imaging with me and conferred to defer the thoracentesis.  He recommends a CT of her chest with contrast to further evaluate this area to determine an etiology if felt appropriate by her care team.  I discussed this with [place], [place].  , E 12:36 PM 11/27/2016

## 2016-11-28 ENCOUNTER — Inpatient Hospital Stay (HOSPITAL_COMMUNITY): Payer: Medicare Other

## 2016-11-28 DIAGNOSIS — J189 Pneumonia, unspecified organism: Secondary | ICD-10-CM

## 2016-11-28 LAB — GLUCOSE, CAPILLARY
GLUCOSE-CAPILLARY: 202 mg/dL — AB (ref 65–99)
GLUCOSE-CAPILLARY: 131 mg/dL — AB (ref 65–99)
GLUCOSE-CAPILLARY: 203 mg/dL — AB (ref 65–99)
Glucose-Capillary: 116 mg/dL — ABNORMAL HIGH (ref 65–99)

## 2016-11-28 LAB — BASIC METABOLIC PANEL
Anion gap: 6 (ref 5–15)
BUN: 11 mg/dL (ref 6–20)
CO2: 31 mmol/L (ref 22–32)
CREATININE: 0.51 mg/dL (ref 0.44–1.00)
Calcium: 8.3 mg/dL — ABNORMAL LOW (ref 8.9–10.3)
Chloride: 98 mmol/L — ABNORMAL LOW (ref 101–111)
GFR calc Af Amer: >60 mL/min (ref >60)
GLUCOSE: 128 mg/dL — AB (ref 65–99)
Potassium: 4.3 mmol/L (ref 3.5–5.1)
SODIUM: 135 mmol/L (ref 135–145)

## 2016-11-28 LAB — CBC
HCT: 27.8 % — ABNORMAL LOW (ref 36.0–46.0)
Hemoglobin: 8.7 g/dL — ABNORMAL LOW (ref 12.0–15.0)
MCH: 28.1 pg (ref 26.0–34.0)
MCHC: 31.3 g/dL (ref 30.0–36.0)
MCV: 89.7 fL (ref 78.0–100.0)
PLATELETS: 430 10*3/uL — AB (ref 150–400)
RBC: 3.1 MIL/uL — ABNORMAL LOW (ref 3.87–5.11)
RDW: 17.4 % — ABNORMAL HIGH (ref 11.5–15.5)
WBC: 8.1 10*3/uL (ref 4.0–10.5)

## 2016-11-28 LAB — VANCOMYCIN, TROUGH: Vancomycin Tr: 19 ug/mL (ref 15–20)

## 2016-11-28 MED ORDER — ALPRAZOLAM 0.25 MG PO TABS
0.2500 mg | ORAL_TABLET | Freq: Once | ORAL | Status: AC
  Administered 2016-11-28: 0.25 mg via ORAL
  Filled 2016-11-28: qty 1

## 2016-11-28 MED ORDER — IPRATROPIUM-ALBUTEROL 0.5-2.5 (3) MG/3ML IN SOLN
3.0000 mL | Freq: Three times a day (TID) | RESPIRATORY_TRACT | Status: DC
  Administered 2016-11-28 – 2016-12-04 (×18): 3 mL via RESPIRATORY_TRACT
  Filled 2016-11-28 (×18): qty 3

## 2016-11-28 MED ORDER — ALPRAZOLAM 0.25 MG PO TABS
0.2500 mg | ORAL_TABLET | Freq: Once | ORAL | Status: AC
  Administered 2016-11-29: 0.25 mg via ORAL
  Filled 2016-11-28: qty 1

## 2016-11-28 MED ORDER — BUDESONIDE 0.5 MG/2ML IN SUSP
0.5000 mg | Freq: Two times a day (BID) | RESPIRATORY_TRACT | Status: DC
  Administered 2016-11-28 – 2016-12-04 (×12): 0.5 mg via RESPIRATORY_TRACT
  Filled 2016-11-28 (×12): qty 2

## 2016-11-28 MED ORDER — ACETYLCYSTEINE 20 % IN SOLN
4.0000 mL | Freq: Three times a day (TID) | RESPIRATORY_TRACT | Status: DC
  Administered 2016-11-28 – 2016-12-04 (×17): 4 mL via RESPIRATORY_TRACT
  Filled 2016-11-28 (×18): qty 4

## 2016-11-28 NOTE — Progress Notes (Signed)
qPhysical Therapy Treatment Patient Details Name: Diana Gardner MRN: [id number] DOB: October 21, 1950 Today's Date: 11/28/2016    History of Present Illness Pt is a 66 y/o female admitted with sepsis, PNA, and empyema. Pt underwent Bronchoscopy, right video-assisted thoracoscopy, right minithoracotomy, drainage of empyema, decortication, right lower lobectomy on 3/28. Of note, pt was intubated from 3/20 to 3/28. PMH including but not limited to copd, htn    PT Comments    Pt making good progress with mobility.   Follow Up Recommendations  SNF     Equipment Recommendations  Other (comment) (defer to next venue)    Recommendations for Other Services       Precautions / Restrictions Precautions Precautions: Fall Restrictions Weight Bearing Restrictions: No    Mobility  Bed Mobility Overal bed mobility: Needs Assistance Bed Mobility: Supine to Sit;Sit to Supine     Supine to sit: HOB elevated;Min assist Sit to supine: Supervision;HOB elevated   General bed mobility comments: Assist to elevate trunk into sitting  Transfers Overall transfer level: Needs assistance Equipment used: Rolling walker (2 wheeled) Transfers: Sit to/from Stand Sit to Stand: Mod assist         General transfer comment: Assist to bring hips up.  Ambulation/Gait Ambulation/Gait assistance: Min assist Ambulation Distance (Feet): 40 Feet Assistive device: Rolling walker (2 wheeled) Gait Pattern/deviations: Step-through pattern;Decreased step length - right;Decreased step length - left Gait velocity: decreased Gait velocity interpretation: Below normal speed for age/gender General Gait Details: Assist for balance and support. Amb on RA with SpO2 94%.   Stairs            Wheelchair Mobility    Modified Rankin (Stroke Patients Only)       Balance Overall balance assessment: Needs assistance Sitting-balance support: Feet supported Sitting balance-Leahy Scale: Fair     Standing  balance support: During functional activity;Bilateral upper extremity supported Standing balance-Leahy Scale: Poor Standing balance comment: walker and min guard for static standing                            Cognition Arousal/Alertness: Awake/alert Behavior During Therapy: WFL for tasks assessed/performed;Anxious Overall Cognitive Status: Within Functional Limits for tasks assessed                                        Exercises      General Comments        Pertinent Vitals/Pain Pain Assessment: No/denies pain    Home Living                      Prior Function            PT Goals (current goals can now be found in the care plan section) Progress towards PT goals: Progressing toward goals;Goals met and updated - see care plan    Frequency    Min 3X/week      PT Plan Current plan remains appropriate    Co-evaluation             End of Session   Activity Tolerance: Patient tolerated treatment well Patient left: in bed;with call bell/phone within reach Nurse Communication: Mobility status PT Visit Diagnosis: Unsteadiness on feet (R26.81);Muscle weakness (generalized) (M62.81)     Time: 1655-3748 PT Time Calculation (min) (ACUTE ONLY): 15 min  Charges:  $Gait Training: 8-22 mins  G CodesSuanne Marker PT [place] 11/28/2016, 3:50 PM

## 2016-11-28 NOTE — Progress Notes (Signed)
Patient ID: Diana Gardner, female   DOB: 06/17/1951, 66 y.o.   MRN: [id number]                                                                PROGRESS NOTE                                                                                                        Patient Demographics:    Diana Gardner, is a 66 y.o. female, DOB - [birth year], [id number]  Admit date - 11/05/2016   Admitting Physician Edwin Dada, MD  Outpatient Primary MD for the patient is [name], MD  LOS - 23  Chief Complaint  Patient presents with  . Shortness of Breath      Brief Narrative    66 y.o.femalewith a PMH of COPD, chronic respiratory failure on home oxygen who was admitted 11/05/16 for a right lower lobe necrotizing community-acquired pneumonia complicated by pneumothorax and empyema status post right facets and right lower lobectomy on 11/15/16. She was intubated from 11/07/16 arrow forward 11/20/16 and under the care of the critical care team. She also required pressor support, which was weaned 11/09/16. Care transferred to [facility].   SIGNIFICANT EVENTS:  3/18 Admitted 3/20 transfer to ICU and intubated 3/21 refractory shock 3/22 Right PTX > chest tube placed. Pressors stopped 3/28 VATS, right mini thoracotomy, drainage of empyema, decortication, right lower lobectomy. 4/02 Extubated 4/03 Re-admitted to ICU for acute hypercarbic respiratory failure. Improved on BiPAP. 4/07 PCCM asked to see for desatn/mucus plug   Subjective:   Pt reports feeling better, had some rest last night and feels less short of breath.    Assessment  & Plan :   Acute on chronic hypoxemic hypercarbic respiratory failure Right lower lobe necrotizing pneumonia Right empyema status post VATS decortication and right lower lobe lobectomy  Right Pleural Effusion vs mucous plug  - CTS reviewed CXR and CT from 4/8, thought to be primarily atelectasis/ consolidation with a small amount of fluid, which would be expected  after a lobecomtomy - IR attempted thoracentesis but unable to perform on 4/9 as not enough fluid to aspirate - CXR this AM notable for an improvement in the appearance of the right lung and with re expansion noted throughout - pt is also clinically better and less dyspnea  - per PCCM, Continue aggressive chest PT > vest, flutter valve, mucomyst - Continue Duoneb Q4  - No role for FOB at this time  - continue vancomycin, cefepime day #4 (started on 11/25/2016) - appreciate PCCM and CTS teams input   Sepsis/septic shock secondary to necrotizing right lower lung pneumonia/empyema (admission 3/18) - Vanco/Rocephin/zithromax 3/18=>3/21 - Vanco 3/28=>3/29 - Unasyn 3/21=>4/7 - Chest tube 3/22 secondary to right pneumothorax - R VATS, R minithoracotomy with drainage of empyema, decortication, right lower lobectomy 3/28 -  Intubated 3/28, Extubated 4/2 - Sputum 3/18 negative - Blood cx 3/18 negative - Respiratory virus panel negative - Urine strep antigen positive - 2-4 week course of unasyn recommended by critical care team but due to Acute respiratory failure 4/7, started Vanco /cefepime 4/7=> - continue same regimen per PCCM for now  - pt overall clinically improving   Copd w exacerbation - Currently on prednisone taper, Duoneb, Pulmicort, Nacetylcysteine - per PCCM  Acute renal failure secondary to nsaid and ace inhibitor - resolved   Hyponatremia  - resolved  Normocytic anemia - Hg is down a bit in the past 24 hours but no signs of active bleeding - CBC in AM  Hyperlipidemia - Cont lipitor  Hypertension - Cont metoprolol  Steroid induced hyperglycemia - Cont ssi  Thrombocytosis  - From infection - improving  - CBC in AM  Code Status : Full code  Family Communication  : w patient, no family at bedside  Disposition Plan  :  Home eventually  Barriers For Discharge : pending an improvement in respiratory status   Consults  :  pccm, CT surgery  Procedures  :    Chest tube 3/22 secondary to right pneumothorax  2-D echo 11/08/16 estimated ejection fraction was in the range of 65% to 70%. grade 1 DD  11/15/16 PROCEDURE:  BRONCHOSCOPY RIGHT VATS/ MINI THORRACOTOMY DRAINAGE OF EMPYEMA AND DECORTICATION RIGHT LOWER LOBECTOMY  DVT Prophylaxis     Lovenox -  SCDs   Cultures 3/20 Tracheal aspirate >> NGTD x 2 days 3/21 Respiratory viral panel negative 3/21 Blood cultures x 2 >> NGTD x 5 days 3/28 R pleural fluid NGTD x 5 days  Antibiotics  Vanco/Rocephin/zithromax 3/18 => 3/21 Vanco 3/28 => 3/29 Unasyn 3/21 =>4 /7 Vancomycin 4/7 => Cefepime 4/7 =>  Anti-infectives    Start     Dose/Rate Route Frequency Ordered Stop   11/26/16 0600  vancomycin (VANCOCIN) IVPB 750 mg/150 ml premix     750 mg 150 mL/hr over 60 Minutes Intravenous Every 12 hours 11/25/16 1810     11/25/16 1830  vancomycin (VANCOCIN) IVPB 1000 mg/200 mL premix     1,000 mg 200 mL/hr over 60 Minutes Intravenous  Once 11/25/16 1810 11/25/16 2100   11/25/16 1830  ceFEPIme (MAXIPIME) 1 g in dextrose 5 % 50 mL IVPB     1 g 100 mL/hr over 30 Minutes Intravenous Every 8 hours 11/25/16 1810     11/16/16 0000  vancomycin (VANCOCIN) IVPB 1000 mg/200 mL premix     1,000 mg 200 mL/hr over 60 Minutes Intravenous Every 12 hours 11/15/16 1711 11/16/16 0100   11/15/16 0715  vancomycin (VANCOCIN) IVPB 1000 mg/200 mL premix     1,000 mg 200 mL/hr over 60 Minutes Intravenous On call to O.R. 11/15/16 0706 11/15/16 1315   11/10/16 0142  Ampicillin-Sulbactam (UNASYN) 3 g in sodium chloride 0.9 % 100 mL IVPB  Status:  Discontinued     3 g 200 mL/hr over 30 Minutes Intravenous Every 8 hours 11/09/16 1809 11/25/16 1758   11/08/16 1700  Ampicillin-Sulbactam (UNASYN) 3 g in sodium chloride 0.9 % 100 mL IVPB  Status:  Discontinued     3 g 200 mL/hr over 30 Minutes Intravenous Every 12 hours 11/08/16 1632 11/09/16 1809   11/07/16 2100  vancomycin (VANCOCIN) IVPB 1000 mg/200 mL premix  Status:   Discontinued     1,000 mg 200 mL/hr over 60 Minutes Intravenous Every 48 hours 11/05/16 2144 11/06/16 0347   11/06/16  2100  cefTRIAXone (ROCEPHIN) 1 g in dextrose 5 % 50 mL IVPB  Status:  Discontinued     1 g 100 mL/hr over 30 Minutes Intravenous Every 24 hours 11/05/16 2326 11/08/16 1609   11/06/16 2100  azithromycin (ZITHROMAX) tablet 500 mg  Status:  Discontinued     500 mg Oral Every 24 hours 11/05/16 2326 11/07/16 1452   11/05/16 2200  azithromycin (ZITHROMAX) 500 mg in dextrose 5 % 250 mL IVPB     500 mg 250 mL/hr over 60 Minutes Intravenous  Once 11/05/16 2154 11/05/16 2315   11/05/16 1945  vancomycin (VANCOCIN) IVPB 1000 mg/200 mL premix     1,000 mg 200 mL/hr over 60 Minutes Intravenous  Once 11/05/16 1944 11/05/16 2209   11/05/16 1930  cefTRIAXone (ROCEPHIN) 1 g in dextrose 5 % 50 mL IVPB     1 g 100 mL/hr over 30 Minutes Intravenous  Once 11/05/16 1926 11/05/16 2023   11/05/16 1930  azithromycin (ZITHROMAX) 500 mg in dextrose 5 % 250 mL IVPB  Status:  Discontinued     500 mg 250 mL/hr over 60 Minutes Intravenous  Once 11/05/16 1926 11/05/16 2014        Objective:   Vitals:   11/28/16 0307 11/28/16 0701 11/28/16 0706 11/28/16 1157  BP: (!) 154/65  136/73   Pulse: 62 68 67   Resp: 20 20 12    Temp: 98.7 F (37.1 C)  98.7 F (37.1 C)   TempSrc: Oral  Oral   SpO2: 100% 100% 96% 98%  Weight:      Height:        Wt Readings from Last 3 Encounters:  11/22/16 64.2 kg (141 lb 8.6 oz)  01/31/16 68.5 kg (151 lb)  11/23/15 69.4 kg (153 lb)     Intake/Output Summary (Last 24 hours) at 11/28/16 1459 Last data filed at 11/28/16 0800  Gross per 24 hour  Intake              470 ml  Output             1425 ml  Net             -955 ml    Physical Exam Awake Alert, Oriented X 3, No new F.N deficits, Normal affect [place].AT,PERRAL Supple Neck,No JVD, No cervical lymphadenopathy appriciated.  Symmetrical Chest wall movement, slightly diminished breath sounds on the right  side but no wheezing, overall much better RRR,No Gallops,Rubs or new Murmurs, No Parasternal Heave +ve B.Sounds, Abd Soft, No tenderness, No organomegaly appriciated, No rebound - guarding or rigidity. No Cyanosis, Clubbing or edema, No new Rash or bruise     Data Review:   CBC  Recent Labs Lab 11/22/16 0340 11/24/16 0212 11/25/16 0318 11/26/16 0155 11/28/16 0219  WBC 6.5 6.2 6.0 7.2 8.1  HGB 8.3* 8.4* 8.6* 9.3* 8.7*  HCT 27.4* 26.9* 27.7* 30.1* 27.8*  PLT 545* 514* 499* 565* 430*  MCV 90.7 87.9 88.8 89.3 89.7  MCH 27.5 27.5 27.6 27.6 28.1  MCHC 30.3 31.2 31.0 30.9 31.3  RDW 17.9* 17.0* 17.1* 17.0* 17.4*    Chemistries   Recent Labs Lab 11/22/16 0340 11/23/16 0249 11/24/16 0212 11/25/16 0318 11/26/16 0155 11/28/16 0219  NA 140 137 135 137 137 135  K 4.6 4.1 3.6 4.0 3.9 4.3  CL 102 97* 95* 98* 99* 98*  CO2 34* 34* 32 29 32 31  GLUCOSE 97 162* 122* 109* 117* 128*  BUN 22* 23* 20 19  13 11  CREATININE 0.61 0.73 0.61 0.54 0.51 0.51  CALCIUM 8.0* 7.8* 7.9* 8.0* 8.2* 8.3*  MG 2.0  --   --   --   --   --   AST  --   --   --   --  18  --   ALT  --   --   --   --  28  --   ALKPHOS  --   --   --   --  84  --   BILITOT  --   --   --   --  0.3  --    Inpatient Medications  Scheduled Meds: . acetylcysteine  4 mL Nebulization TID  . atorvastatin  20 mg Oral Daily  . budesonide (PULMICORT) nebulizer solution  0.5 mg Nebulization BID  . ceFEPime (MAXIPIME) IV  1 g Intravenous Q8H  . chlorhexidine  15 mL Mouth Rinse BID  . enoxaparin (LOVENOX) injection  40 mg Subcutaneous Q24H  . feeding supplement (ENSURE ENLIVE)  237 mL Oral BID BM  . insulin aspart  0-9 Units Subcutaneous TID WC  . ipratropium-albuterol  3 mL Nebulization TID  . mouth rinse  15 mL Mouth Rinse q12n4p  . metoprolol tartrate  25 mg Oral BID  . pantoprazole  40 mg Oral Daily  . predniSONE  10 mg Oral Q breakfast   Followed by  . [START ON 11/30/2016] predniSONE  5 mg Oral Q breakfast  . saccharomyces  boulardii  250 mg Oral BID  . vancomycin  750 mg Intravenous Q12H   Continuous Infusions: PRN Meds:.albuterol, fentaNYL (SUBLIMAZE) injection, Gerhardt's butt cream, labetalol, ondansetron (ZOFRAN) IV  Micro Results Recent Results (from the past 240 hour(s))  C difficile quick scan w PCR reflex     Status: None   Collection Time: 11/25/16 12:46 PM  Result Value Ref Range Status   C Diff antigen NEGATIVE NEGATIVE Final   C Diff toxin NEGATIVE NEGATIVE Final   C Diff interpretation No C. difficile detected.  Final   Radiology Reports Dg Chest 2 View  Result Date: 11/28/2016 CLINICAL DATA:  Persistent cough and chest congestion. Admitted 3 weeks ago with right-sided pneumonia and atelectasis. Persistent cough and chest congestion EXAM: CHEST  2 VIEW COMPARISON:  Chest x-ray and chest CT scan of April 8th 2018 FINDINGS: There has been marked improvement in the appearance of the right lung with re-expansion noted throughout. A small right pleural effusion persists. There is a trace of pleural fluid on the left. The left lung is well-expanded and clear. The cardiac silhouette is top-normal in size. The pulmonary vascularity is normal. There is calcification in the wall of the aortic arch. The trachea is midline. IMPRESSION: Marked improvement in the appearance of the right lung. Right lower lobe infiltrate and small pleural effusion persist. A trace left pleural effusion is present. Thoracic aortic atherosclerosis. Electronically Signed   By: David  Martinique M.D.   On: 11/28/2016 09:30   Dg Chest 2 View  Result Date: 11/24/2016 CLINICAL DATA:  Recent right lower lobectomy for empyema. Currently with cough EXAM: CHEST  2 VIEW COMPARISON:  November 23, 2016 FINDINGS: There has been partial clearing of airspace consolidation from the lung bases compared to 1 day prior. There remains patchy bibasilar atelectasis with small pleural effusions bilaterally. Lungs elsewhere are clear. Note that there is volume loss  on the right consistent with previous surgery. Heart size is normal. The pulmonary vascular is within normal limits. No  adenopathy. No bone lesions. IMPRESSION: Bibasilar atelectasis with small pleural effusions. No consolidation. Stable volume loss on the right. No new opacity. Stable cardiac silhouette. No evident pneumothorax. Electronically Signed   By: Lowella Grip III M.D.   On: 11/24/2016 08:38   Dg Chest 2 View  Result Date: 11/05/2016 CLINICAL DATA:  Shortness of breath and cough. EXAM: CHEST  2 VIEW COMPARISON:  August 28, 2015 FINDINGS: The cardiomediastinal silhouette is stable. No pneumothorax. The left lung is clear. New effusion and underlying opacity seen on the right. IMPRESSION: New effusion and underlying opacity seen in the right lower lobe. No other interval changes. Recommend follow-up to resolution. Electronically Signed   By: Dorise Bullion III M.D   On: 11/05/2016 19:03   Ct Chest Wo Contrast  Result Date: 11/26/2016 CLINICAL DATA:  Right lower lobe surgery for necrotizing pneumonia 11/15/2016. Became short of breath last night with hypoxia. EXAM: CT CHEST WITHOUT CONTRAST TECHNIQUE: Multidetector CT imaging of the chest was performed following the standard protocol without IV contrast. COMPARISON:  CT 11/13/2016, 02/19/2015 and 10/13/2005 as well as recent chest x-ray 11/26/2016. FINDINGS: Cardiovascular: Interval removal of central venous catheter. Mild cardiomegaly with minimal cardiomediastinal shift to the right compatible recent right lower lobectomy. Minimal calcified plaque over the left main and left anterior descending coronary arteries. Mild calcified plaque over the thoracic aorta. Mediastinum/Nodes: 1 cm precarinal lymph node without significant change likely reactive. No hilar adenopathy. Remaining mediastinal structures are unremarkable. Lungs/Pleura: Interval removal of endotracheal tube. Subtle patchy density over the left apex improved. Mild left basilar  atelectasis. Surgical sutures over the posteromedial right lower thorax compatible with right lower lobectomy. Moderate-size right pleural effusion. Heterogeneous consolidation over the right mid to upper lung. No pneumothorax. Central right-sided bronchi not well aerated. Upper Abdomen: Unchanged. Musculoskeletal: Mild degenerative change of the spine. IMPRESSION: Postsurgical change compatible recent right lower lobectomy with volume loss of the right lung and cardiomediastinal shift to the right. Moderate-size right effusion tracking to the apex. Heterogeneous consolidation over the right mid to upper lung with decreased aeration of the central bronchi which may be due to persistent infection. Patchy opacification over the left apex improved. Left basilar atelectasis. Mild cardiomegaly. Mild atherosclerotic coronary artery disease. Aortic atherosclerosis. Electronically Signed   By: Marin Olp M.D.   On: 11/26/2016 17:04   Ct Chest Wo Contrast  Result Date: 11/13/2016 CLINICAL DATA:  66 year old female with history of loculated pleural effusion with chest tube in place. EXAM: CT CHEST WITHOUT CONTRAST TECHNIQUE: Multidetector CT imaging of the chest was performed following the standard protocol without IV contrast. COMPARISON:  Chest CT 02/19/2015. Multiple recent prior chest radiographs. FINDINGS: Cardiovascular: Heart size is normal. There is no significant pericardial fluid, thickening or pericardial calcification. There is aortic atherosclerosis, as well as atherosclerosis of the great vessels of the mediastinum and the coronary arteries, including calcified atherosclerotic plaque in the left main and left anterior descending coronary arteries. Left internal jugular central venous catheter with tip terminating in the superior aspect of the right atrium. Mediastinum/Nodes: Patient is intubated, with the tip of the endotracheal to the lying approximately 2.1 cm above the carina. Nasogastric tube extends  into the stomach. Numerous borderline enlarged and mildly enlarged mediastinal and bilateral hilar lymph nodes, measuring up to 11 mm in short axis in the subcarinal nodal station. Esophagus is unremarkable in appearance. No axillary lymphadenopathy. Lungs/Pleura: Right-sided chest tube in position with tip directed into the medial aspect of the apex of the  right hemithorax. Large complex right-sided pleural fluid and gas collection. Fluid ranges from low to intermediate attenuation, suggesting proteinaceous contents. Some of the pneumothorax component is confluent anteriorly, while other locules of gas are noted throughout the lower right hemithorax, some of which appear to be within collapsed/consolidated lung tissue, but other small locules of gas likely are present within loculated pleural fluid in the lower right hemithorax. In the medial aspect of the lower right hemithorax there is what appears to be a thick-walled cavitary area, which is favored to be within the lung parenchyma rather than in the pleural space, measuring up to 6.4 x 2.4 x 6.5 cm (axial image 83 of series 201, and coronal image 56 of series 203). Areas of bronchiectasis are noted within the right lower lobe. Within the aerated portions of the lungs there is patchy multifocal ground-glass attenuation and septal thickening. The ground-glass attenuation is most evident in a peribronchovascular distribution, likely infectious/inflammatory in etiology, most severe in the left upper lobe. No significant left pleural effusion. Upper Abdomen: Unremarkable. Musculoskeletal: Extensive emphysema is noted throughout the subcutaneous fat of the chest wall bilaterally extending up into the cervical regions bilaterally (right greater than left). There are no aggressive appearing lytic or blastic lesions noted in the visualized portions of the skeleton. IMPRESSION: 1. Findings are most compatible with right lower lobe necrotizing pneumonia, with what appears  to be a large right lower lobe cavity in the medial aspect of the basal segments of the right lower lobe, as well as a large right empyema. In this complex right pleural gas and fluid collections there both a small anterior pneumothorax, as well as multifocal loculated components of gas within the inferior aspect of the right pleural space, as above. Right-sided chest tube is directed into the medial aspect of the right apex, and the tip of the chest tube is currently surrounded by very little pleural fluid or gas. 2. Multifocal patchy interstitial and airspace disease asymmetrically distributed throughout the remaining aerated portions of the lungs, favored to reflect severe multilobar bronchopneumonia. 3. Aortic atherosclerosis, in addition to left main and left anterior descending coronary artery disease. Please note that although the presence of coronary artery calcium documents the presence of coronary artery disease, the severity of this disease and any potential stenosis cannot be assessed on this non-gated CT examination. Assessment for potential risk factor modification, dietary therapy or pharmacologic therapy may be warranted, if clinically indicated. 4. Support apparatus, as above. Electronically Signed   By: Vinnie Langton M.D.   On: 11/13/2016 15:19   US Renal  Result Date: 11/06/2016 CLINICAL DATA:  Sepsis EXAM: RENAL / URINARY TRACT ULTRASOUND COMPLETE COMPARISON:  None. FINDINGS: Right Kidney: Length: 9.4 cm, within normal limits. Echogenicity within normal limits. No mass or hydronephrosis visualized. Left Kidney: Length: 10.9 cm, within normal limits. Echogenicity within normal limits. No mass or hydronephrosis visualized. Bladder: Appears normal for degree of bladder distention. IMPRESSION: Negative bilateral renal ultrasound. Electronically Signed   By: San Morelle M.D.   On: 11/06/2016 12:41   Dg Chest Port 1 View  Result Date: 11/26/2016 CLINICAL DATA:  Respiratory failure.  Hx of COPD, partial right lung removed(11/20/16), hypertension, primary spontaneous pneumothorax. Nonsmoker, 2nd hand smoke exposure. EXAM: PORTABLE CHEST 1 VIEW COMPARISON:  Chest x-rays dated 11/25/2016 and 11/24/2016. FINDINGS: There is now complete opacification the right hemithorax, presumably large pleural effusion and/or airspace collapse, favor large pleural effusion given the stable position of the mediastinum. There is stable volume loss on  the right that is compatible with history of previous right lower lobe resection. Left lung is clear. Osseous structures about the chest are unremarkable. IMPRESSION: Complete opacification of the right hemithorax, significantly worsened compared to yesterday's exam, favor large pleural effusion. These results will be called to the ordering clinician or representative by the Radiologist Assistant, and communication documented in the PACS or zVision Dashboard. Electronically Signed   By: Franki Cabot M.D.   On: 11/26/2016 07:33   Dg Chest Port 1 View  Result Date: 11/25/2016 CLINICAL DATA:  Concern for spontaneous pneumothorax. Personal history of spontaneous pneumothorax. Shortness of breath, acute onset. Initial encounter. EXAM: PORTABLE CHEST 1 VIEW COMPARISON:  Chest radiograph performed earlier today at 6:43 a.m. FINDINGS: A small right pleural effusion is suspected, with associated airspace consolidation. This raises concern for pneumonia, new from the prior study. Minimal left basilar opacity likely reflects atelectasis or scarring, stable from the prior study. No pneumothorax is seen. The patient is status post right lower lobectomy. Underlying chronic right pleural thickening is noted. The cardiomediastinal silhouette is mildly enlarged. No acute osseous abnormalities are identified. IMPRESSION: 1. Small right pleural effusion suspected, with associated airspace consolidation. This raises concern for pneumonia, new from the prior study. Minimal left basilar  airspace opacity likely reflects atelectasis or scarring. 2. No evidence of pneumothorax. 3. Mild cardiomegaly. Electronically Signed   By: Garald Balding M.D.   On: 11/25/2016 17:46   Dg Chest Port 1 View  Result Date: 11/25/2016 CLINICAL DATA:  Respiratory failure EXAM: PORTABLE CHEST 1 VIEW COMPARISON:  Yesterday FINDINGS: Streaky densities at the bases, right more than left. Asymmetric elevation of the right diaphragm, increased. There is right pleural thickening reaching the apex. Borderline cardiomegaly, accentuated by technique. Small left effusions based on lateral film yesterday. No Kerley lines or pneumothorax. IMPRESSION: 1. Mildly increased atelectasis at the right base. 2. Right pleural thickening and lower lobectomy. Electronically Signed   By: Monte Fantasia M.D.   On: 11/25/2016 08:32   Dg Chest Port 1 View  Result Date: 11/23/2016 CLINICAL DATA:  CHEST TUBE REMOVAL/RIGHT EXAM: PORTABLE CHEST 1 VIEW COMPARISON:  11/23/2016 FINDINGS: Right chest tube has been removed. No pneumothorax. Heart size is accentuated by the AP portable technique. There are patchy densities in the lung bases bilaterally which partially obscure the hemidiaphragms, increased on the left. Opacity at the left lung base now obscures the hemidiaphragm. No pulmonary edema. IMPRESSION: Bilateral lower lobe opacities, increased on the left. No pneumothorax following removal of chest tube. Electronically Signed   By: Nolon Nations M.D.   On: 11/23/2016 12:53   Dg Chest Port 1 View  Result Date: 11/23/2016 CLINICAL DATA:  Shortness of breath.  Pleural effusion . EXAM: PORTABLE CHEST 1 VIEW COMPARISON:  11/22/2016.  11/21/2016 . FINDINGS: Right chest tube in stable position. No pneumothorax. Stable cardiomegaly. Interim improvement of pulmonary interstitial prominence suggesting improvement of pulmonary interstitial edema. Tiny bilateral pleural effusions. Stable biapical pleural thickening most likely secondary scarring .  Prominent skin folds noted over the chest. IMPRESSION: 1. Right chest tube in stable position.  No pneumothorax. 2. Stable cardiomegaly. Interim improvement of pulmonary interstitial prominence suggesting improving interstitial edema. Tiny bilateral pleural effusions again noted. Electronically Signed   By: Marcello Moores  Register   On: 11/23/2016 07:34   Dg Chest Port 1 View  Result Date: 11/22/2016 CLINICAL DATA:  66 y/o  F; increasing shortness of breath. EXAM: PORTABLE CHEST 1 VIEW COMPARISON:  11/21/2016 chest radiograph. FINDINGS: Stable cardiac  silhouette given projection and technique. Aortic atherosclerosis with calcification. Stable position of right-sided chest tube. Increase interstitial opacities in the lungs bilaterally probably represents developing interstitial pulmonary edema. Increasing opacity of the right lung base probably represents worsening pleural effusion and atelectasis. No acute osseous abnormality identified. IMPRESSION: Increase interstitial opacities probably representing developing edema. Increasing opacity of the right lung and right lung base, likely increasing pleural effusion. No appreciable pneumothorax. Electronically Signed   By: Kristine Garbe M.D.   On: 11/22/2016 05:29   Dg Chest Port 1 View  Result Date: 11/21/2016 CLINICAL DATA:  History of pneumothorax.  Chest tube removal. EXAM: PORTABLE CHEST 1 VIEW COMPARISON:  11/20/2016. FINDINGS: Interim extubation. Interim removal of the lateral most chest tube. Two medial most chest tube is in stable position. Left IJ line in stable position . Heart size stable. Right base subsegmental atelectasis. Perihilar atelectasis/infiltrate. Small bilateral pleural effusions. No pneumothorax. IMPRESSION: 1. Interim extubation. Interim removal of lateral most chest tube. Two medial most chest tubes in stable position. Left IJ line stable position. No pneumothorax. 2. Persistent right base subsegmental atelectasis. Mild left  perihilar atelectasis/ infiltrate noted on today's exam. Small bilateral pleural effusions. Electronically Signed   By: Marcello Moores  Register   On: 11/21/2016 06:45   Dg Chest Port 1 View  Result Date: 11/20/2016 CLINICAL DATA:  Status post thoracoscopy and empyema drainage with decortication on the right. EXAM: PORTABLE CHEST 1 VIEW COMPARISON:  Portable chest x-ray of November 19, 2016 FINDINGS: There remains mild volume loss on the right. There is no pneumothorax. The right-sided chest tube tip projects over the posterior aspect of the fourth rib. A second more medially positioned chest tube has its tip approximately 2 cm lateral to the hands. A third chest tube has its tip projecting over the medial aspect of the ninth rib. The left lung is well-expanded. Minimal basilar atelectasis is suspected on the left. The cardiac silhouette is top-normal in size. The pulmonary vascularity is not engorged. The endotracheal tube tip lies approximately 3.7 cm above the carina. The esophagogastric tube tip projects below the inferior margin of the image. The left internal jugular venous catheter tip projects over the distal third of the SVC. IMPRESSION: Fairly stable appearance of the chest. No right-sided pneumothorax or significant pleural effusion. Minimal postsurgical volume loss due to lower lobectomy. Minimal left basilar atelectasis. The support tubes are in reasonable position. Electronically Signed   By: David  Martinique M.D.   On: 11/20/2016 07:08   Dg Chest Port 1 View  Result Date: 11/19/2016 CLINICAL DATA:  Pneumonia EXAM: PORTABLE CHEST 1 VIEW COMPARISON:  11/18/2016 FINDINGS: Three chest tubes on the right unchanged. No pneumothorax. Minimal right effusion unchanged. Mild bibasilar airspace disease unchanged Endotracheal tube 2.5 cm above the carina. Left jugular central venous catheter tip in the right atrium. NG tube enters the stomach. IMPRESSION: No change from yesterday. Electronically Signed   By: Franchot Gallo M.D.   On: 11/19/2016 07:07   Dg Chest Port 1 View  Result Date: 11/18/2016 CLINICAL DATA:  Evaluate ETT. EXAM: PORTABLE CHEST 1 VIEW COMPARISON:  November 17, 2016 FINDINGS: The ETT is in good position. Right-sided chest tubes remain, in good position. The enteric tube terminates below today's study but the side port is below the GE junction within the stomach. No pneumothorax. The cardiomediastinal silhouette is stable. Small bilateral pleural effusions with underlying atelectasis, unchanged. A left-sided central line terminates near the caval atrial junction. It is possible it could extend  into the right atrium. This appears to be unchanged in the interval. No overt edema. IMPRESSION: 1. Stable support apparatus. The distal tip of the right central line is near the caval atrial junction, either within the distal SVC or just within the right atrium. 2. Small bilateral effusions with underlying atelectasis remain. Electronically Signed   By: Dorise Bullion III M.D   On: 11/18/2016 07:26   Dg Chest Port 1 View  Result Date: 11/17/2016 CLINICAL DATA:  Chest 2.  Change in respirations and breath sounds EXAM: PORTABLE CHEST 1 VIEW COMPARISON:  11/17/2016 FINDINGS: Right chest tube, endotracheal tube, NG tube and left central line remain in place, unchanged. Bibasilar atelectasis is stable. No pneumothorax. Probable small effusions. IMPRESSION: No pneumothorax. Continued bibasilar atelectasis and small effusions. Electronically Signed   By: Rolm Baptise M.D.   On: 11/17/2016 12:57   Dg Chest Port 1 View  Result Date: 11/17/2016 CLINICAL DATA:  Respiratory failure EXAM: PORTABLE CHEST 1 VIEW COMPARISON:  11/16/2016 FINDINGS: Endotracheal tube, nasogastric catheter and left jugular central line are again seen and stable. Chest tubes are noted on the right and stable. No pneumothorax is seen. Bibasilar atelectatic changes are seen. No bony abnormality is noted. IMPRESSION: Tubes and lines as described.  No  pneumothorax is noted. Stable bibasilar changes. Electronically Signed   By: Inez Catalina M.D.   On: 11/17/2016 07:32   Dg Chest Port 1 View  Result Date: 11/16/2016 CLINICAL DATA:  Acute respiratory failure EXAM: PORTABLE CHEST 1 VIEW COMPARISON:  11/15/2016 FINDINGS: Cardiac shadow remains mildly enlarged. Endotracheal tube, nasogastric catheter and left jugular central line are again seen. Multiple right chest tubes are again noted and stable. No definitive pneumothorax is seen. Bibasilar infiltrative changes are noted right greater than left. The changes on the left has increased slightly in the interval from the prior exam. IMPRESSION: Postsurgical changes. No pneumothorax is seen. Bibasilar infiltrative changes are seen. Electronically Signed   By: Inez Catalina M.D.   On: 11/16/2016 07:34   Dg Chest Port 1 View  Result Date: 11/15/2016 CLINICAL DATA:  Right lower lobe lobectomy for pulmonary abscess/empyema EXAM: PORTABLE CHEST 1 VIEW COMPARISON:  11/15/2016 FINDINGS: The heart size and mediastinal contours are within normal limits. Endotracheal tube tip is slightly low lying at 2.4 cm above the carina. Pullback approximately 1 cm recommended. Left IJ central line catheter is noted at the cavoatrial junction. Two right-sided chest tubes are identified projecting over the lung apex with decrease in previously noted loculated pleural opacity along the periphery of the right hemithorax. Bibasilar atelectasis is identified. No significant pneumothorax. Gastric tube extends below the left hemidiaphragm into the expected location the stomach. The tip is excluded however. No acute osseous abnormality appear IMPRESSION: 1. Slightly low-lying endotracheal tube tip approximately 2.4 cm above the carina. Pullback approximately 1 cm recommended. Otherwise, satisfactory support line and tube positions. These results will be called to the ordering clinician or representative by the Radiologist Assistant, and  communication documented in the PACS or zVision Dashboard. 2. Residual atelectasis at the right lung base without appreciable pneumothorax status post right lower lobectomy. Electronically Signed   By: Ashley Royalty M.D.   On: 11/15/2016 19:57   Dg Chest Port 1 View  Result Date: 11/15/2016 CLINICAL DATA:  Respiratory failure. EXAM: PORTABLE CHEST 1 VIEW COMPARISON:  11/14/2016 CT 11/13/2016 . FINDINGS: Endotracheal tube, NG tube, left IJ line, right chest tube in stable position. No definite pneumothorax identified. Previously identified anterior pneumothorax best  identified on CT of 11/13/2016. Persistent basilar infiltrates, particularly prominent on the right. Persistent prominent right sided pleural effusions/possible empyema again noted. No change. IMPRESSION: 1. Lines and tubes including right chest tube in stable position. No pneumothorax identified. Previously identified anterior pneumothorax best demonstrated by prior CT of 11/13/2016. 2. Persistent loculated right pleural effusion/empyema. No acute change. Persistent basilar infiltrates, particular prominent on the right. No change. Electronically Signed   By: Marcello Moores  Register   On: 11/15/2016 06:46   Dg Chest Port 1 View  Result Date: 11/14/2016 CLINICAL DATA:  Respiratory failure. EXAM: PORTABLE CHEST 1 VIEW COMPARISON:  CT 11/13/2016.  Chest x-ray 11/13/2016. FINDINGS: Endotracheal tube, NG tube, left IJ line, right chest tube stable position. Heart size stable. Bilateral pulmonary infiltrates with right side pleural effusion/ possible empyema again noted. Previously identified anterior pneumothorax is best demonstrated by CT of 11/13/2016. Mild chest wall subcutaneous emphysema again noted. IMPRESSION: 1. Lines and tubes including right chest tube in stable position. 2. Persistent bibasilar infiltrates. Persistent right sided prominent pleural effusion/ possible empyema again noted without significant change. Previously identified anterior  pneumothorax best demonstrated about recent CT. No definite pneumothorax noted on this chest x-ray. Electronically Signed   By: Marcello Moores  Register   On: 11/14/2016 07:04   Dg Chest Port 1 View  Result Date: 11/13/2016 CLINICAL DATA:  Pneumothorax.  Shortness of breath . EXAM: PORTABLE CHEST 1 VIEW COMPARISON:  11/12/2016. FINDINGS: Endotracheal tube, left IJ line, NG tube, right chest tube in stable position. No pneumothorax. Persistent right side pleural effusion with increase in size from prior exam. Right base atelectasis. Heart size normal. Subcutaneous emphysema has improved. IMPRESSION: 1. Lines and tubes is including right chest tube in stable position. No pneumothorax. 2.  Interim increase in right pleural effusion. Electronically Signed   By: Marcello Moores  Register   On: 11/13/2016 06:47   Dg Chest Port 1 View  Result Date: 11/12/2016 CLINICAL DATA:  Patient with history of pneumothorax. EXAM: PORTABLE CHEST 1 VIEW COMPARISON:  Chest radiograph 11/11/2016 FINDINGS: ET tube terminates in the mid trachea. Central venous catheter tip projects over the right atrium. Monitoring leads overlie the patient. Right-sided chest tube projects over the right lung apex. Stable cardiac and mediastinal contours. Persistent heterogeneous opacities right mid lower lung. Heterogeneous opacities left lung base. Moderate right pleural effusion. No definite pneumothorax. Subcutaneous emphysema. IMPRESSION: Stable support apparatus. Persistent moderate right pleural effusion with underlying opacities. No visible right-sided pneumothorax. Electronically Signed   By: Lovey Newcomer M.D.   On: 11/12/2016 08:40   Dg Chest Port 1 View  Result Date: 11/11/2016 CLINICAL DATA:  Continued surveillance pneumothorax. EXAM: PORTABLE CHEST 1 VIEW COMPARISON:  11/10/2016. FINDINGS: Unchanged cardiomediastinal silhouette and support apparatus. RIGHT chest tube remains in good position in the RIGHT lung apex. Visualized pneumothorax is less,  although RIGHT pleural effusion is increased. BILATERAL pulmonary opacities appear worse. Subcutaneous emphysema redemonstrated. IMPRESSION: No visible pneumothorax.  Increasing RIGHT effusion. Electronically Signed   By: Staci Righter M.D.   On: 11/11/2016 07:51   Dg Chest Port 1 View  Result Date: 11/10/2016 CLINICAL DATA:  Pneumothorax.  Chest tube. EXAM: PORTABLE CHEST 1 VIEW COMPARISON:  Chest x-ray 11/10/2016. FINDINGS: Endotracheal tube, left IJ line, NG tube, right chest tube in stable position. Heart size normal. Bilateral pulmonary infiltrates. Small right pneumothorax noted on today's exam. Diffuse chest wall subcutaneous emphysema again noted. IMPRESSION: 1. Lines and tubes including right chest tube in stable position. Small right pneumothorax noted on today's  exam. Diffuse chest wall subcutaneous emphysema noted. 2. Diffuse bilateral pulmonary infiltrates. Critical Value/emergent results were called by telephone at the time of interpretation on 11/10/2016 at 3:17 pm to nurse Anderson Malta, who verbally acknowledged these results. Electronically Signed   By: Marcello Moores  Register   On: 11/10/2016 15:19   Dg Chest Port 1 View  Result Date: 11/10/2016 CLINICAL DATA:  Respiratory failure. EXAM: PORTABLE CHEST 1 VIEW COMPARISON:  11/09/2016. FINDINGS: Endotracheal tube, NG tube, left IJ line in stable position . Right chest tube in stable position. No pneumothorax. Diffuse bilateral chest wall and neck subcutaneous emphysema. Subcutaneous emphysema has increased significantly from prior exam. IMPRESSION: 1. Lines and tubes in stable position. Right chest tube in stable position. No pneumothorax. 2. Persistent bilateral pulmonary infiltrates, right greater than left. Small right pleural effusion. No interim change. 3. Interim significant progression of diffuse bilateral chest wall and bilateral neck subcutaneous emphysema. Electronically Signed   By: Marcello Moores  Register   On: 11/10/2016 07:13   Dg Chest Port 1  View  Result Date: 11/09/2016 CLINICAL DATA:  Post right chest tube placement EXAM: PORTABLE CHEST 1 VIEW COMPARISON:  11/09/2016 FINDINGS: Cardiomediastinal silhouette is stable. Persistent bilateral basilar infiltrates right greater than left. Small right pleural effusion. There is right chest tube in place with tip in right apex. No definite pneumothorax. Subcutaneous emphysema noted right axilla. Stable endotracheal and NG tube position. Stable left IJ central line position. IMPRESSION: Stable support apparatus. Persistent bilateral basilar infiltrates right greater than left. Small right pleural effusion. There is right chest tube in place with tip in right apex. No definite pneumothorax. Subcutaneous emphysema noted right axilla. Electronically Signed   By: Lahoma Crocker M.D.   On: 11/09/2016 10:27   Dg Chest Port 1 View  Result Date: 11/09/2016 CLINICAL DATA:  Respiratory failure EXAM: PORTABLE CHEST 1 VIEW COMPARISON:  11/08/2016 FINDINGS: Endotracheal tube, nasogastric catheter and left jugular central line are again seen and stable. Cardiac shadow is stable. Bibasilar infiltrates and effusions are seen slightly worse on the right than the left. New left upper lobe infiltrative changes are noted. A new right-sided pneumothorax is noted with approximately 2 cm excursion at the apex at and 1 cm excursion laterally. IMPRESSION: New right pneumothorax. Bibasilar infiltrates as well as new left upper lobe infiltrate are seen. Effusions are seen right greater than left. Critical Value/emergent results were called by telephone at the time of interpretation on 11/09/2016 at 7:04 am to [facility], the pts nurse, who verbally acknowledged these results and will contact primary physician. Electronically Signed   By: Inez Catalina M.D.   On: 11/09/2016 07:05   Portable Chest Xray  Result Date: 11/08/2016 CLINICAL DATA:  Hypoxia EXAM: PORTABLE CHEST 1 VIEW COMPARISON:  November 07, 2016 FINDINGS: Endotracheal tube tip is  1.5 cm above the carina. Nasogastric tube tip and side port are below the diaphragm. Central catheter tip is at the cavoatrial junction. No pneumothorax. There is airspace consolidation in both lower lobes, not significantly changed from 1 day prior. There are small pleural effusions bilaterally. Heart size and pulmonary vascularity are normal. There is atherosclerotic calcification in the aorta. No adenopathy. No bone lesions. IMPRESSION: Tube and catheter positions as described without evident pneumothorax. Airspace opacity suspicious for pneumonia in both lower lobes. Small pleural effusions bilaterally. No interstitial edema appreciable. Heart size normal. There is aortic atherosclerosis. Electronically Signed   By: Lowella Grip III M.D.   On: 11/08/2016 07:41   Dg Chest [facility]  Result Date: 11/07/2016 CLINICAL DATA:  Central line placement. EXAM: PORTABLE CHEST 1 VIEW COMPARISON:  Earlier today at 1412 hours FINDINGS: 1527 hours. Left internal jugular line is difficult to follow centrally secondary to overlying wires and leads. Terminates at least at the level of the high right atrium. Endotracheal and nasogastric tubes are unchanged. Normal heart size. Atherosclerosis in the transverse aorta. Small left pleural effusion is unchanged. No pneumothorax. Bibasilar airspace disease is not significantly changed. Mild interstitial edema. IMPRESSION: Left internal jugular line is difficult to follow centrally secondary to overlying wires and leads. Followed to at least the level of the high right atrium. Consider retraction 2-3 cm with repeat imaging (ideally after removing EKG leads and wires). Otherwise, similar appearance of the chest with bibasilar airspace disease, left pleural fluid, and interstitial edema. Electronically Signed   By: Abigail Miyamoto M.D.   On: 11/07/2016 15:56   Dg Chest Port 1 View  Result Date: 11/07/2016 CLINICAL DATA:  Endotracheal tube EXAM: PORTABLE CHEST 1 VIEW COMPARISON:   03/09/2017 FINDINGS: Small bilateral pleural effusions. Right lower lobe airspace disease. No pneumothorax. Stable cardiomediastinal silhouette. Endotracheal to with the tip 3.5 cm above the carina. Nasogastric tube coursing below the diaphragm. IMPRESSION: 1. Small bilateral pleural effusions. Right lower lobe airspace disease concerning for pneumonia. 2. Small bilateral pleural effusions. Electronically Signed   By: Kathreen Devoid   On: 11/07/2016 14:29   Dg Chest Port 1 View  Result Date: 11/07/2016 CLINICAL DATA:  Pleural effusion EXAM: PORTABLE CHEST 1 VIEW COMPARISON:  Chest radiograph 11/05/2016 FINDINGS: Unchanged cardiomediastinal contours. Small right pleural effusion and associated basilar consolidation is unchanged. The left lung is clear. IMPRESSION: Unchanged small right pleural effusion and right basilar consolidation. Electronically Signed   By: Ulyses Jarred M.D.   On: 11/07/2016 02:43   Dg Abd Portable 1v  Result Date: 11/07/2016 CLINICAL DATA:  Enteric tube placement EXAM: PORTABLE ABDOMEN - 1 VIEW COMPARISON:  None. FINDINGS: Enteric tube terminates in the distal body of the stomach. No disproportionately dilated small bowel loops. Mild colonic stool volume. No evidence of pneumatosis or pneumoperitoneum. Patchy bibasilar lung opacities. IMPRESSION: 1. Enteric tube terminates in the distal body of the stomach. 2. Nonobstructive bowel gas pattern. 3. Nonspecific patchy bibasilar lung opacities, correlate with chest radiograph. Electronically Signed   By: Ilona Sorrel M.D.   On: 11/07/2016 14:29   Ir US Chest  Result Date: 11/27/2016 CLINICAL DATA:  Patient with recent VATS and chest tube on the right side. Recent CT scan concerning for possible pleural effusion. Request is made for thoracentesis. EXAM: CHEST ULTRASOUND COMPARISON:  None. FINDINGS: Small area concerning for either lung consolidation or fluid with sedimentation present within the right inferior chest. IMPRESSION: Small  area concerning for either lung consolidation or fluid with sedimentation within the right inferior chest cavity. This was confirmed with Dr. Barbie Banner. Thoracentesis was deferred. If the patient's kidney function is normal and this needs to be further evaluated, he would recommend a CT scan of the chest with contrast as the most recent was without contrast. Read by: Saverio Danker, PA-C Electronically Signed   By: Marybelle Killings M.D.   On: 11/27/2016 13:29   Ir US Chest  Result Date: 11/06/2016 CLINICAL DATA:  Patient admitted with right-sided pneumonia. Recent imaging suggested possible right-sided pleural effusion. Request is made for thoracentesis. EXAM: CHEST ULTRASOUND COMPARISON:  None. FINDINGS: Minimal right-sided effusion. Most of what is seen is consolidation of the right lung secondary to pneumonia. IMPRESSION:  Unable to perform thoracentesis secondary to minimal effusion. Read by: Saverio Danker, PA-C Electronically Signed   By: Aletta Edouard M.D.   On: 11/06/2016 11:57   Time Spent in minutes  30 min   Magick- M.D on 11/28/2016 at 2:59 PM  Between 7am to 7pm - Pager - [phone]  After 7pm go to [email or web address] - password [facility]  [facility] -  Office  [phone]

## 2016-11-28 NOTE — Progress Notes (Signed)
Pharmacy Antibiotic Note Diana Gardner is a 65 y.o. female admitted on 11/05/2016 with pneumonia.  Currently on day 4 of vancomycin and cefepime for treatment.   Vancomycin trough of 19 is therapeutic this evening. SCr remains stable.   Plan: Continue Vancomycin 750m IV q12h for now Continue Cefepime 1 gram IV every 8 hours Follow up planned length of treatment SCr every 72 hours while on vancomycin   Height: _0  (162.6 cm) Weight: 141 lb 8.6 oz (64.2 kg) IBW/kg (Calculated) : 54.7  Temp (24hrs), Avg:98.4 F (36.9 C), Min:97.8 F (36.6 C), Max:98.7 F (37.1 C)   Recent Labs Lab 11/22/16 0340 11/23/16 0249 11/24/16 0212 11/25/16 0318 11/26/16 0155 11/28/16 0219 11/28/16 1804  WBC 6.5  --  6.2 6.0 7.2 8.1  --   CREATININE 0.61 0.73 0.61 0.54 0.51 0.51  --   VANCOTROUGH  --   --   --   --   --   --  19    Estimated Creatinine Clearance: 58.9 mL/min (by C-G formula based on SCr of 0.51 mg/dL).    Allergies  Allergen Reactions  . No Known Allergies     Antimicrobials this admission:  Cefepime 4/7>>  Vancomycin 3/18 x1; 4/7>> Azithro 3/18 >> 3/19 CTX 3/18 >> 3/21 Unasyn 3/21 >>4/7  Microbiology results: 3/18 Strep urine + 3/18 mrsa pcr: neg 3/18 urine cx: mult species 3/20 resp cx: neg 3/21 RVP: neg 3/21 blood cx: neg 3/28 BAL: neg 3/28 lung abscess: neg 4/7 Cdiff: neg  Thank you for allowing pharmacy to be a part of this patient's care.  AVincenza Hews PharmD, BCPS 11/28/2016, 7:19 PM

## 2016-11-28 NOTE — Progress Notes (Signed)
Per pt request, notified md for pt's home Medication Xanax 0.25 1-2 tabs prn.  Will continue to monitor. Saunders Revel T

## 2016-11-28 NOTE — Progress Notes (Signed)
Pharmacy Antibiotic Note  Diana Gardner is a 66 y.o. female admitted on 11/05/2016 with pneumonia.  Pharmacy has been consulted for vancomycin and cefepime dosing.  Afebrile and wbc count is normal. Plan per pulmonary is to continue current antibiotics for now. Will check vancomycin trough tonight to ensure not accumulating. Scr is normal and stable. No new culture data. CXR improved.  Plan: Vancomycin 750m IV q12h Cefepime 1g IV q8h  Height: _0  (162.6 cm) Weight: 141 lb 8.6 oz (64.2 kg) IBW/kg (Calculated) : 54.7  Temp (24hrs), Avg:98.5 F (36.9 C), Min:97.8 F (36.6 C), Max:98.9 F (37.2 C)   Recent Labs Lab 11/22/16 0340 11/23/16 0249 11/24/16 0212 11/25/16 0318 11/26/16 0155 11/28/16 0219  WBC 6.5  --  6.2 6.0 7.2 8.1  CREATININE 0.61 0.73 0.61 0.54 0.51 0.51    Estimated Creatinine Clearance: 58.9 mL/min (by C-G formula based on SCr of 0.51 mg/dL).    Allergies  Allergen Reactions  . No Known Allergies     Antimicrobials this admission: Cefepime 4/7>> Vanc 3/18 x1; 4/7>> Azithro 3/18 >> 3/19 CTX 3/18 >> 3/21 Unasyn 3/21 >>4/7  Microbiology results: Strep urine + 3/18 mrsa pcr: neg 3/18 urine cx: mult species 3/20 resp cx: neg 3/21 RVP: neg 3/21 blood cx: neg 3/28 BAL: neg 3/28 lung abscess: neg 4/7 Cdiff: neg  Thank you for allowing pharmacy to be a part of this patient's care.  FErin HearingPharmD., BCPS Clinical Pharmacist Pager [phone]/05/2017 1:01 PM

## 2016-11-28 NOTE — Progress Notes (Signed)
PULMONARY  / CRITICAL CARE MEDICINE  Name: Diana Gardner MRN:   [id number] DOB:   28-May-1951               REFERRING MD :  Triad  CHIEF COMPLAINT:  Acute on chronic hypercarbic respiratory failure  BRIEF PATIENT DESCRIPTION: Diana Gardner is a 66 year old woman with COPD on home oxygen hospitalized for RLL necrotizing CAP complicated by pneumothorax and empyema s/p VATS and lobectomy who developed acute on chronic hypercarbic respiratory failure.  She improved and was transferred out of the ICU under [facility] service.   On 4/7, pt had hypoxemia and resp distress.  CXR was concerning for mucus plug.  PCCM was asked to see again.   LINES / TUBES:  3/20 Radial a-line >> 3/25 3/28 Chest tube >> discontinued  CULTURES: 3/20 Tracheal aspirate >> NGTD x 2 days 3/21 Respiratory viral panel negative 3/21 Blood cultures x 2 >> NGTD x 5 days 3/28 R pleural fluid NGTD x 5 days  ANTIBIOTICS: Azithromycin 3/18 > discontinued Ceftriaxone 3/18 >> 3/21 Vanc 3/21 >> 3/21 Unasyn 3/21 > discontinued Cefepime 4/7 > Vanc 4/7 >   SIGNIFICANT EVENTS:  3/18  Admitted 3/20  transfer to ICU and intubated 3/21  refractory shock 3/22  Right PTX > chest tube placed. Pressors stopped 3/28  VATS, right mini thoracotomy, drainage of empyema, decortication, right lower lobectomy. 4/02  Extubated 4/03  Re-admitted to ICU for acute hypercarbic respiratory failure. Improved on BiPAP. 4/07  PCCM asked to see for desatn/mucus plug 4/09  CXR Significantly improved    SUBJECTIVE :  Pt up to chair.  Reports feeling good.  No acute events > has been working hard of flutter and chest vest   Vitals:  Vitals:   11/28/16 0307 11/28/16 0701 11/28/16 0706 11/28/16 1157  BP: (!) 154/65  136/73   Pulse: 62 68 67   Resp: 20 20 12    Temp: 98.7 F (37.1 C)  98.7 F (37.1 C)   TempSrc: Oral  Oral   SpO2: 100% 100% 96% 98%  Weight:      Height:        Body mass index is 24.29 kg/m. Wt Readings  from Last 3 Encounters:  11/22/16 141 lb 8.6 oz (64.2 kg)  01/31/16 151 lb (68.5 kg)  11/23/15 153 lb (69.4 kg)    Exam: General: thin adult female in NAD, sitting in chair  HEENT: MM pink/moist PSY: calm/appropriate Neuro: AAOx4, speech clear, MAE CV: s1s2 rrr, no m/r/g PULM: even/non-labored, diminished on R, clear on L WJ:XBJY, non-tender, bsx4 active  Extremities: warm/dry, no edema  Skin: no rashes or lesions   CBC Recent Labs     11/26/16  0155  11/28/16  0219  WBC  7.2  8.1  HGB  9.3*  8.7*  HCT  30.1*  27.8*  PLT  565*  430*    Coag's No results for input(s): APTT, INR in the last 72 hours.  BMET Recent Labs     11/26/16  0155  11/28/16  0219  NA  137  135  K  3.9  4.3  CL  99*  98*  CO2  32  31  BUN  13  11  CREATININE  0.51  0.51  GLUCOSE  117*  128*    Electrolytes Recent Labs     11/26/16  0155  11/28/16  0219  CALCIUM  8.2*  8.3*    Sepsis Markers No results for  input(s): PROCALCITON, O2SATVEN in the last 72 hours.  Invalid input(s): LACTICACIDVEN  ABG No results for input(s): PHART, PCO2ART, PO2ART in the last 72 hours.  Liver Enzymes Recent Labs     11/26/16  0155  AST  18  ALT  28  ALKPHOS  84  BILITOT  0.3  ALBUMIN  1.9*    Cardiac Enzymes No results for input(s): TROPONINI, PROBNP in the last 72 hours.  Glucose Recent Labs     11/27/16  0743  11/27/16  1307  11/27/16  2106  11/27/16  2121  11/28/16  0813  11/28/16  1116  GLUCAP  133*  161*  199*  215*  116*  202*    Imaging Dg Chest 2 View  Result Date: 11/28/2016 CLINICAL DATA:  Persistent cough and chest congestion. Admitted 3 weeks ago with right-sided pneumonia and atelectasis. Persistent cough and chest congestion EXAM: CHEST  2 VIEW COMPARISON:  Chest x-ray and chest CT scan of April 8th 2018 FINDINGS: There has been marked improvement in the appearance of the right lung with re-expansion noted throughout. A small right pleural effusion persists. There  is a trace of pleural fluid on the left. The left lung is well-expanded and clear. The cardiac silhouette is top-normal in size. The pulmonary vascularity is normal. There is calcification in the wall of the aortic arch. The trachea is midline. IMPRESSION: Marked improvement in the appearance of the right lung. Right lower lobe infiltrate and small pleural effusion persist. A trace left pleural effusion is present. Thoracic aortic atherosclerosis. Electronically Signed   By: David  Martinique M.D.   On: 11/28/2016 09:30   Ct Chest Wo Contrast  Result Date: 11/26/2016 CLINICAL DATA:  Right lower lobe surgery for necrotizing pneumonia 11/15/2016. Became short of breath last night with hypoxia. EXAM: CT CHEST WITHOUT CONTRAST TECHNIQUE: Multidetector CT imaging of the chest was performed following the standard protocol without IV contrast. COMPARISON:  CT 11/13/2016, 02/19/2015 and 10/13/2005 as well as recent chest x-ray 11/26/2016. FINDINGS: Cardiovascular: Interval removal of central venous catheter. Mild cardiomegaly with minimal cardiomediastinal shift to the right compatible recent right lower lobectomy. Minimal calcified plaque over the left main and left anterior descending coronary arteries. Mild calcified plaque over the thoracic aorta. Mediastinum/Nodes: 1 cm precarinal lymph node without significant change likely reactive. No hilar adenopathy. Remaining mediastinal structures are unremarkable. Lungs/Pleura: Interval removal of endotracheal tube. Subtle patchy density over the left apex improved. Mild left basilar atelectasis. Surgical sutures over the posteromedial right lower thorax compatible with right lower lobectomy. Moderate-size right pleural effusion. Heterogeneous consolidation over the right mid to upper lung. No pneumothorax. Central right-sided bronchi not well aerated. Upper Abdomen: Unchanged. Musculoskeletal: Mild degenerative change of the spine. IMPRESSION: Postsurgical change compatible  recent right lower lobectomy with volume loss of the right lung and cardiomediastinal shift to the right. Moderate-size right effusion tracking to the apex. Heterogeneous consolidation over the right mid to upper lung with decreased aeration of the central bronchi which may be due to persistent infection. Patchy opacification over the left apex improved. Left basilar atelectasis. Mild cardiomegaly. Mild atherosclerotic coronary artery disease. Aortic atherosclerosis. Electronically Signed   By: Marin Olp M.D.   On: 11/26/2016 17:04   Ir US Chest  Result Date: 11/27/2016 CLINICAL DATA:  Patient with recent VATS and chest tube on the right side. Recent CT scan concerning for possible pleural effusion. Request is made for thoracentesis. EXAM: CHEST ULTRASOUND COMPARISON:  None. FINDINGS: Small area concerning for either  lung consolidation or fluid with sedimentation present within the right inferior chest. IMPRESSION: Small area concerning for either lung consolidation or fluid with sedimentation within the right inferior chest cavity. This was confirmed with Dr. Barbie Banner. Thoracentesis was deferred. If the patient's kidney function is normal and this needs to be further evaluated, he would recommend a CT scan of the chest with contrast as the most recent was without contrast. Read by: Saverio Danker, PA-C Electronically Signed   By: Marybelle Killings M.D.   On: 11/27/2016 13:29     ASSESSMENT / PLAN:  Acute on chronic hypoxemic hypercarbic respiratory failure - improved.  Right lower lobe necrotizing pneumonia - worse based on chest CT scan.  CXR with ? Cutoff sign 4/8, mucus plugging ?? Right empyema status post VATS decortication and right lower lobe lobectomy  Small Right Pleural Effusion - not enough pleural fluid for thora on [place] assessment  Hx COPD  Plan: Continue aggressive chest PT > vest, flutter valve, mucomyst Continue Duoneb Q4  No role for FOB at this time  Continue empiric abx, D4/x   Follow intermittent CXR  O2 as needed to support sats >90% PT efforts  Follow up with Pulmonary at discharge > see discharge section   Noe Gens, NP-C [facility] Pgr: [phone] or if no answer [phone] 11/28/2016, 12:14 PM    ATTENDING NOTE / ATTESTATION NOTE :   I have discussed the case with the resident/APP  Noe Gens NP.   I agree with the resident/APP's  history, physical examination, assessment, and plans.    I have edited the above note and modified it according to our agreed history, physical examination, assessment and plan.   Briefly, patient admitted with empyema for which she had decortication/VATS 12 days ago. She improved and was transferred to the floor. On broad spectrum antibiotics. Over the weekend, she had lung collapse. Her x-ray has slowly improved. Today's x-ray was probably her best. Chest CT scan done yesterday showed consultation and possible loculated effusion. Ultrasound at bedside did not reveal significant effusion. Radiology also did not see significant effusion. She is clinically improved. Vital signs stable. On 2 L oxygen. Breath sounds are better in the right lung. Rest of exam unremarkable.  Empyema, right. Status post decortication. Concern for new healthcare associated pneumonia.   Continue current antibiotics. In 1-2 days, consider de-escalating. We'll decrease her vest CPT to TID from every 4 as well as mucomyst to TID from every 4. Continue other nebulizer meds. Continue PT. Continue incentive spirometry. Continue flutter valve. Aspiration precaution.  Monica Becton, MD 11/28/2016, 5:35 PM  Pulmonary and Critical Care Pager [phone] After 3 pm or if no answer, call [phone]

## 2016-11-29 LAB — GLUCOSE, CAPILLARY
GLUCOSE-CAPILLARY: 177 mg/dL — AB (ref 65–99)
GLUCOSE-CAPILLARY: 157 mg/dL — AB (ref 65–99)
Glucose-Capillary: 115 mg/dL — ABNORMAL HIGH (ref 65–99)
Glucose-Capillary: 163 mg/dL — ABNORMAL HIGH (ref 65–99)

## 2016-11-29 LAB — CBC
HEMATOCRIT: 26.2 % — AB (ref 36.0–46.0)
Hemoglobin: 8.5 g/dL — ABNORMAL LOW (ref 12.0–15.0)
MCH: 28.7 pg (ref 26.0–34.0)
MCHC: 32.4 g/dL (ref 30.0–36.0)
MCV: 88.5 fL (ref 78.0–100.0)
PLATELETS: 397 10*3/uL (ref 150–400)
RBC: 2.96 MIL/uL — AB (ref 3.87–5.11)
RDW: 17.6 % — ABNORMAL HIGH (ref 11.5–15.5)
WBC: 7.8 10*3/uL (ref 4.0–10.5)

## 2016-11-29 LAB — BASIC METABOLIC PANEL
Anion gap: 7 (ref 5–15)
BUN: 12 mg/dL (ref 6–20)
CO2: 29 mmol/L (ref 22–32)
Calcium: 8.2 mg/dL — ABNORMAL LOW (ref 8.9–10.3)
Chloride: 98 mmol/L — ABNORMAL LOW (ref 101–111)
Creatinine, Ser: 0.61 mg/dL (ref 0.44–1.00)
GFR calc Af Amer: >60 mL/min (ref >60)
GLUCOSE: 153 mg/dL — AB (ref 65–99)
POTASSIUM: 3.9 mmol/L (ref 3.5–5.1)
Sodium: 134 mmol/L — ABNORMAL LOW (ref 135–145)

## 2016-11-29 NOTE — Progress Notes (Signed)
Nutrition Follow-up  DOCUMENTATION CODES:   Not applicable  INTERVENTION:   -Continue Ensure Enlive po BID, each supplement provides 350 kcal and 20 grams of protein  NUTRITION DIAGNOSIS:   Inadequate oral intake related to poor appetite (SOB) as evidenced by meal completion < 50%.  Progressing  GOAL:   Patient will meet greater than or equal to 90% of their needs  Progressing  MONITOR:   PO intake, Supplement acceptance, I & O's  REASON FOR ASSESSMENT:   Consult Assessment of nutrition requirement/status (RD to order TF per discussion with CCM team)  ASSESSMENT:   66 year old female with COPD admitted 3/18 with sepsis secondary to pneumonia. She began to suffer respiratory failure and was started on BiPAP ultimately failing requiring transfer to ICU 3/20 where she was intubated and and started on vasoactive medications.   Pt sleeping soundly at time of visit. Did not arouse when name was called. Noted bag of trail mix on tray table.   Spoke with RN, who reports pt's appetite has improved. Meal completion 50-95%. Per RN, pt consumed a large breakfast this AM consisting of eggs, bacon, biscuit, and fresh fruit, which pt consumed at least 50%. Pt also taking Ensure supplements well, prefers strawberry flavor.   Labs reviewed: Na: 134, CBGS: 115-203.   Diet Order:  Diet heart healthy/carb modified Room service appropriate? Yes; Fluid consistency: Thin  Skin:  Reviewed, no issues  Last BM:  11/27/16  Height:   Ht Readings from Last 1 Encounters:  11/22/16 5\' 4"  (1.626 m)    Weight:   Wt Readings from Last 1 Encounters:  11/22/16 141 lb 8.6 oz (64.2 kg)    Ideal Body Weight:  58 kg  BMI:  Body mass index is 24.29 kg/m.  Estimated Nutritional Needs:   Kcal:  1600-1800  Protein:  90-100 grams   Fluid:  > 1.6 L/day  EDUCATION NEEDS:   No education needs identified at this time   A. Jimmye Norman, RD, LDN, CDE Pager: [phone] After hours Pager:  [phone]

## 2016-11-29 NOTE — Progress Notes (Signed)
Physical Therapy Treatment Patient Details Name: Diana Gardner MRN: [id number] DOB: [birth year] Today's Date: 11/29/2016    History of Present Illness Pt is a 66 y/o female admitted 11/05/16 with sepsis, PNA, and empyema. Pt underwent bronchoscopy, right video-assisted thoracoscopy, right minithoracotomy, drainage of empyema, decortication, right lower lobectomy on 3/28. Of note, pt was intubated from 3/20-28. Pertinent PMH includes COPD, HTN.    PT Comments    Pt progressing with mobility. Able to amb in hallway with RW and min guard for balance, VSS on 1L [place] O2; significantly slowed gait speed indicates increased risk for falls. Educ on importance of using IS and flutter valve. Pt remains motivated to work with PT. Will continued to follow acutely.  Follow Up Recommendations  SNF     Equipment Recommendations  Other (comment) (Defer to next venue)    Recommendations for Other Services       Precautions / Restrictions Precautions Precautions: Fall Restrictions Weight Bearing Restrictions: No    Mobility  Bed Mobility Overal bed mobility: Needs Assistance Bed Mobility: Supine to Sit;Sit to Supine     Supine to sit: HOB elevated;Min assist     General bed mobility comments: MinA for UE support to elevate trunk into sitting.   Transfers Overall transfer level: Needs assistance Equipment used: Rolling walker (2 wheeled) Transfers: Sit to/from Stand Sit to Stand: Mod assist         General transfer comment: Stood with RW and modA for trunk support into standing; verbal cues for hand placement on RW.   Ambulation/Gait Ambulation/Gait assistance: Min guard Ambulation Distance (Feet): 80 Feet Assistive device: Rolling walker (2 wheeled) Gait Pattern/deviations: Step-through pattern;Decreased stride length Gait velocity: Decreased   General Gait Details: Amb with RW and min guard for balance; pt with significantly slowed gait speed, requiring increased time for  turns. VSS on 1L O2 [place].    Stairs            Wheelchair Mobility    Modified Rankin (Stroke Patients Only)       Balance Overall balance assessment: Needs assistance Sitting-balance support: Feet supported Sitting balance-Leahy Scale: Fair     Standing balance support: During functional activity;Bilateral upper extremity supported Standing balance-Leahy Scale: Poor Standing balance comment: Reliant on BUE support for standing balance.                            Cognition Arousal/Alertness: Awake/alert Behavior During Therapy: WFL for tasks assessed/performed Overall Cognitive Status: Within Functional Limits for tasks assessed                                        Exercises      General Comments        Pertinent Vitals/Pain Pain Assessment: No/denies pain    Home Living                      Prior Function            PT Goals (current goals can now be found in the care plan section) Acute Rehab PT Goals Patient Stated Goal: decrease pain PT Goal Formulation: With patient Time For Goal Achievement: 12/07/16 Potential to Achieve Goals: Good Progress towards PT goals: Progressing toward goals    Frequency    Min 3X/week      PT Plan Current plan  remains appropriate    Co-evaluation             End of Session Equipment Utilized During Treatment: Gait belt;Oxygen Activity Tolerance: Patient tolerated treatment well Patient left: in chair;with nursing/sitter in room;with call bell/phone within reach Nurse Communication: Mobility status PT Visit Diagnosis: Unsteadiness on feet (R26.81);Muscle weakness (generalized) (M62.81)     Time: 2263-3354 PT Time Calculation (min) (ACUTE ONLY): 26 min  Charges:  $Gait Training: 8-22 mins $Therapeutic Activity: 8-22 mins                    G Codes:      Enis Gash, SPT [phone]  Mabeline Caras 11/29/2016, 4:27 PM

## 2016-11-29 NOTE — Progress Notes (Signed)
PULMONARY  / CRITICAL CARE MEDICINE  Name: Diana Gardner MRN:   [id number] DOB:   [birth year]               REFERRING MD :  [facility]ad  CHIEF COMPLAINT:  Acute on chronic hypercarbic respiratory failure  BRIEF PATIENT DESCRIPTION: Diana Gardner is a 66 year old woman with COPD on home oxygen hospitalized for RLL necrotizing CAP complicated by pneumothorax and empyema s/p VATS and lobectomy who developed acute on chronic hypercarbic respiratory failure.  She improved and was transferred out of the ICU under [facility] service.   On 4/7, pt had hypoxemia and resp distress.  CXR was concerning for mucus plug.  PCCM was asked to see again.   LINES / TUBES:  3/20 Radial a-line >> 3/25 3/28 Chest tube >> discontinued  CULTURES: 3/20 Tracheal aspirate >> negative 3/21 Respiratory viral panel negative 3/21 Blood cultures x 2 >> negative 3/28 R pleural fluid >> negative  ANTIBIOTICS: Azithromycin 3/18 > discontinued Ceftriaxone 3/18 >> 3/21 Vanc 3/21 >> 3/21 Unasyn 3/21 > discontinued Cefepime 4/7 > Vanc 4/7 >   SIGNIFICANT EVENTS:  3/18  Admitted 3/20  transfer to ICU and intubated 3/21  refractory shock 3/22  Right PTX > chest tube placed. Pressors stopped 3/28  VATS, right mini thoracotomy, drainage of empyema, decortication, right lower lobectomy. 4/02  Extubated 4/03  Re-admitted to ICU for acute hypercarbic respiratory failure. Improved on BiPAP. 4/07  PCCM asked to see for desatn/mucus plug 4/09  CXR Significantly improved    SUBJECTIVE :  Pt up to chair.  Reports feeling good.  No acute events > has been working hard of flutter and chest vest   Vitals:  Vitals:   11/28/16 1500 11/28/16 2025 11/29/16 0013 11/29/16 0432  BP: 139/68  (!) 146/66 (!) 147/66  Pulse: 71  67 (!) 55  Resp: (!) 24  (!) 23 16  Temp: 98.6 F (37 C)  98.7 F (37.1 C) 98.7 F (37.1 C)  TempSrc: Oral  Oral Oral  SpO2: 98% 100% 99% 98%  Weight:      Height:        Body mass index  is 24.29 kg/m. Wt Readings from Last 3 Encounters:  11/22/16 141 lb 8.6 oz (64.2 kg)  01/31/16 151 lb (68.5 kg)  11/23/15 153 lb (69.4 kg)    Exam: General: thin adult female in NAD, sitting in chair  HEENT: MM pink/moist PSY: calm/appropriate Neuro: AAOx4, speech clear, MAE CV: s1s2 rrr, no m/r/g PULM: even/non-labored, diminished on R, clear on L WN:IOEV, non-tender, bsx4 active  Extremities: warm/dry, no edema  Skin: no rashes or lesions   CBC Recent Labs     11/28/16  0219  11/29/16  0218  WBC  8.1  7.8  HGB  8.7*  8.5*  HCT  27.8*  26.2*  PLT  430*  397    Coag's No results for input(s): APTT, INR in the last 72 hours.  BMET Recent Labs     11/28/16  0219  11/29/16  0218  NA  135  134*  K  4.3  3.9  CL  98*  98*  CO2  31  29  BUN  11  12  CREATININE  0.51  0.61  GLUCOSE  128*  153*    Electrolytes Recent Labs     11/28/16  0219  11/29/16  0218  CALCIUM  8.3*  8.2*    Sepsis Markers No results for input(s):  PROCALCITON, O2SATVEN in the last 72 hours.  Invalid input(s): LACTICACIDVEN  ABG No results for input(s): PHART, PCO2ART, PO2ART in the last 72 hours.  Liver Enzymes No results for input(s): AST, ALT, ALKPHOS, BILITOT, ALBUMIN in the last 72 hours.  Cardiac Enzymes No results for input(s): TROPONINI, PROBNP in the last 72 hours.  Glucose Recent Labs     11/27/16  2121  11/28/16  0813  11/28/16  1116  11/28/16  1627  11/28/16  2139  11/29/16  0811  GLUCAP  215*  116*  202*  131*  203*  115*    Imaging Dg Chest 2 View  Result Date: 11/28/2016 CLINICAL DATA:  Persistent cough and chest congestion. Admitted 3 weeks ago with right-sided pneumonia and atelectasis. Persistent cough and chest congestion EXAM: CHEST  2 VIEW COMPARISON:  Chest x-ray and chest CT scan of April 8th 2018 FINDINGS: There has been marked improvement in the appearance of the right lung with re-expansion noted throughout. A small right pleural effusion  persists. There is a trace of pleural fluid on the left. The left lung is well-expanded and clear. The cardiac silhouette is top-normal in size. The pulmonary vascularity is normal. There is calcification in the wall of the aortic arch. The trachea is midline. IMPRESSION: Marked improvement in the appearance of the right lung. Right lower lobe infiltrate and small pleural effusion persist. A trace left pleural effusion is present. Thoracic aortic atherosclerosis. Electronically Signed   By: David  Martinique M.D.   On: 11/28/2016 09:30   Ir US Chest  Result Date: 11/27/2016 CLINICAL DATA:  Patient with recent VATS and chest tube on the right side. Recent CT scan concerning for possible pleural effusion. Request is made for thoracentesis. EXAM: CHEST ULTRASOUND COMPARISON:  None. FINDINGS: Small area concerning for either lung consolidation or fluid with sedimentation present within the right inferior chest. IMPRESSION: Small area concerning for either lung consolidation or fluid with sedimentation within the right inferior chest cavity. This was confirmed with Dr. Barbie Banner. Thoracentesis was deferred. If the patient's kidney function is normal and this needs to be further evaluated, he would recommend a CT scan of the chest with contrast as the most recent was without contrast. Read by: Saverio Danker, PA-C Electronically Signed   By: Marybelle Killings M.D.   On: 11/27/2016 13:29     ASSESSMENT / PLAN:  Acute on chronic hypoxemic hypercarbic respiratory failure - improved.  Right lower lobe necrotizing pneumonia - worse based on chest CT scan.  CXR with ? Cutoff sign 4/8, mucus plugging ?? Right empyema status post VATS decortication and right lower lobe lobectomy  Small Right Pleural Effusion - not enough pleural fluid for thora on [place] assessment  Hx COPD  Plan: Continue aggressive chest PT > vest, flutter valve, mucomyst Continue Duoneb Q4  No role for FOB at this time  Continue empiric abx, D4/x  Can  change to Augmentin at discharge and continue until follow up with repeat imaging (if clinically better can consider d/c abx) Follow intermittent CXR  O2 as needed to support sats >90% PT efforts  Follow up with Pulmonary at discharge > see discharge section for details   PCCM will be available PRN.  Please call back if new needs arise.    Noe Gens, NP-C [place] [facility] Pgr: [phone] or if no answer [phone] 11/29/2016, 10:49 AM    ATTENDING NOTE / ATTESTATION NOTE :   I have discussed the case with the resident/APP  Noe Gens NP.   I agree with the resident/APP's  history, physical examination, assessment, and plans.    I have edited the above note and modified it according to our agreed history, physical examination, assessment and plan.   Briefly, pt admitted with SOB, cough, fevers.  She was diagnoswed with empyema for which she had VATS + Decortication.  She clinically worsened over the weekend with mucus plug + new infiltrate.  She is now significantly improved.  Less SOB. On [place]. Better ae on R lung.  CXR is significantly improved.   Suggest changing abx to augmentin and Rx for at least 2 weeks.  She needs f/u with pulm within 2 weeks from d/c to have rpt CXR.  If rpt CXR remains stable, reassess the need for further abx.  Will decrease her vest CPT and Mucomyst to TID (RT instructed).  I will keep at least for the weekend.  Wean off when possible,   PCCM will sign off for now.  Call back if with issues.   Monica Becton, MD 11/29/2016, 9:46 PM [place] [facility] Pager [phone] After 3 pm or if no answer, call [phone]

## 2016-11-29 NOTE — Progress Notes (Signed)
      [address]       ,[place] [address]             [phone]      Resting comfortably. No problems with breathing this AM  BP (!) 147/66 (BP Location: Left Arm)   Pulse (!) 55   Temp 98.7 F (37.1 C) (Oral)   Resp 16   Ht 5\' 4"  (1.626 m)   Wt 141 lb 8.6 oz (64.2 kg)   SpO2 98%   BMI 24.29 kg/m    Intake/Output Summary (Last 24 hours) at 11/29/16 0843 Last data filed at 11/29/16 0700  Gross per 24 hour  Intake              930 ml  Output              700 ml  Net              230 ml   66 yo woman in NAD Alert and oriented Lungs clear anteriorly  CHEST  2 VIEW  COMPARISON:  Chest x-ray and chest CT scan of April 8th 2018  FINDINGS: There has been marked improvement in the appearance of the right lung with re-expansion noted throughout. A small right pleural effusion persists. There is a trace of pleural fluid on the left. The left lung is well-expanded and clear. The cardiac silhouette is top-normal in size. The pulmonary vascularity is normal. There is calcification in the wall of the aortic arch. The trachea is midline.  IMPRESSION: Marked improvement in the appearance of the right lung. Right lower lobe infiltrate and small pleural effusion persist. A trace left pleural effusion is present.  Thoracic aortic atherosclerosis.   Electronically Signed   By: David  Martinique M.D.   On: 11/28/2016 09:30 I personally reviewed CXR from yesterday and concur with the findings noted above  Atelectasis improved. CXR looked much better yesterday. Will repeat in AM Continue antibiotics Continue IS and flutter Mobilize as tolerated  Remo Lipps C. Roxan Hockey, MD [facility] [phone]

## 2016-11-29 NOTE — Progress Notes (Signed)
Patient ID: Diana Gardner, female   DOB: Aug 25, 1950, 66 y.o.   MRN: [id number]                                                                PROGRESS NOTE                                                                                                        Patient Demographics:    Diana Gardner, is a 66 y.o. female, DOB - 06/21/1951, DJS:[id number]  Admit date - 11/05/2016   Admitting Physician Edwin Dada, MD  Outpatient Primary MD for the patient is [name], MD  LOS - 24  Chief Complaint  Patient presents with  . Shortness of Breath      Brief Narrative    66 y.o.femalewith a PMH of COPD, chronic respiratory failure on home oxygen who was admitted 11/05/16 for a right lower lobe necrotizing community-acquired pneumonia complicated by pneumothorax and empyema status post right facets and right lower lobectomy on 11/15/16. She was intubated from 11/07/16 arrow forward 11/20/16 and under the care of the critical care team. She also required pressor support, which was weaned 11/09/16. Care transferred to [facility].   SIGNIFICANT EVENTS:  3/18 Admitted 3/20 transfer to ICU and intubated 3/21 refractory shock 3/22 Right PTX > chest tube placed. Pressors stopped 3/28 VATS, right mini thoracotomy, drainage of empyema, decortication, right lower lobectomy. 4/02 Extubated 4/03 Re-admitted to ICU for acute hypercarbic respiratory failure. Improved on BiPAP. 4/07 PCCM asked to see for desatn/mucus plug   Subjective:   Pt reports feeling better, had some rest last night and feels less short of breath.    Assessment  & Plan :   Acute on chronic hypoxemic hypercarbic respiratory failure Right lower lobe necrotizing pneumonia Right empyema status post VATS decortication and right lower lobe lobectomy  Right Pleural Effusion vs mucous plug  - CTS reviewed CXR and CT from 4/8, thought to be primarily atelectasis/ consolidation with a small amount of fluid, which would be expected  after a lobecomtomy - IR attempted thoracentesis but unable to perform on 4/9 as not enough fluid to aspirate - CXR this AM notable for an improvement in the appearance of the right lung and with re expansion noted throughout - pt is also clinically better and less dyspnea  - per PCCM, Continue aggressive chest PT > vest, flutter valve, mucomyst - Continue Duoneb Q4  - No role for FOB at this time  - continue vancomycin, cefepime day #5 (started on 11/25/2016) - appreciate PCCM and CTS teams input  - plan to change to Augmentin at discharge and continue until follow up with repeat imaging - Follow up with Pulmonary at discharge > see discharge section for details  Sepsis/septic shock secondary to necrotizing right lower lung pneumonia/empyema (admission 3/18) - Vanco/Rocephin/zithromax  3/18=>3/21 - Vanco 3/28=>3/29 - Unasyn 3/21=>4/7 - Chest tube 3/22 secondary to right pneumothorax - R VATS, R minithoracotomy with drainage of empyema, decortication, right lower lobectomy 3/28 - Intubated 3/28, Extubated 4/2 - Sputum 3/18 negative - Blood cx 3/18 negative - Respiratory virus panel negative - Urine strep antigen positive - 2-4 week course of unasyn recommended by critical care team but due to Acute respiratory failure 4/7, started Vanco /cefepime 4/7=> - continue same regimen per PCCM for now with plan to change to PO Augmentin upon diccharge  - pt overall clinically improving   Copd w exacerbation - Currently on prednisone taper, Duoneb, Pulmicort, Nacetylcysteine - overall stable   Acute renal failure secondary to nsaid and ace inhibitor - resolved   Hyponatremia  - overall stable - will monitor   Normocytic anemia - Hg is down a bit in the past 24 hours but no signs of active bleeding - CBC in AM  Hyperlipidemia - Cont lipitor  Hypertension - Cont metoprolol - reasonable inpatient control   Steroid induced hyperglycemia - Cont ssi  Thrombocytosis  - From  infection - improving, Plt WNL this AM  - CBC in AM  Code Status : Full code  Family Communication  : w patient, no family at bedside  Disposition Plan  :  Home eventually  Barriers For Discharge : pending an improvement in respiratory status   Consults  :  pccm, CT surgery  Procedures  :  Chest tube 3/22 secondary to right pneumothorax  2-D echo 11/08/16 estimated ejection fraction was in the range of 65% to 70%. grade 1 DD  11/15/16 PROCEDURE:  BRONCHOSCOPY RIGHT VATS/ MINI THORRACOTOMY DRAINAGE OF EMPYEMA AND DECORTICATION RIGHT LOWER LOBECTOMY  DVT Prophylaxis     Lovenox -  SCDs   Cultures 3/20 Tracheal aspirate >> NGTD x 2 days 3/21 Respiratory viral panel negative 3/21 Blood cultures x 2 >> NGTD x 5 days 3/28 R pleural fluid NGTD x 5 days  Antibiotics  Vanco/Rocephin/zithromax 3/18 => 3/21 Vanco 3/28 => 3/29 Unasyn 3/21 =>4 /7 Vancomycin 4/7 => Cefepime 4/7 =>  Anti-infectives    Start     Dose/Rate Route Frequency Ordered Stop   11/26/16 0600  vancomycin (VANCOCIN) IVPB 750 mg/150 ml premix     750 mg 150 mL/hr over 60 Minutes Intravenous Every 12 hours 11/25/16 1810     11/25/16 1830  vancomycin (VANCOCIN) IVPB 1000 mg/200 mL premix     1,000 mg 200 mL/hr over 60 Minutes Intravenous  Once 11/25/16 1810 11/25/16 2100   11/25/16 1830  ceFEPIme (MAXIPIME) 1 g in dextrose 5 % 50 mL IVPB     1 g 100 mL/hr over 30 Minutes Intravenous Every 8 hours 11/25/16 1810     11/16/16 0000  vancomycin (VANCOCIN) IVPB 1000 mg/200 mL premix     1,000 mg 200 mL/hr over 60 Minutes Intravenous Every 12 hours 11/15/16 1711 11/16/16 0100   11/15/16 0715  vancomycin (VANCOCIN) IVPB 1000 mg/200 mL premix     1,000 mg 200 mL/hr over 60 Minutes Intravenous On call to O.R. 11/15/16 0706 11/15/16 1315   11/10/16 0142  Ampicillin-Sulbactam (UNASYN) 3 g in sodium chloride 0.9 % 100 mL IVPB  Status:  Discontinued     3 g 200 mL/hr over 30 Minutes Intravenous Every 8 hours  11/09/16 1809 11/25/16 1758   11/08/16 1700  Ampicillin-Sulbactam (UNASYN) 3 g in sodium chloride 0.9 % 100 mL IVPB  Status:  Discontinued  3 g 200 mL/hr over 30 Minutes Intravenous Every 12 hours 11/08/16 1632 11/09/16 1809   11/07/16 2100  vancomycin (VANCOCIN) IVPB 1000 mg/200 mL premix  Status:  Discontinued     1,000 mg 200 mL/hr over 60 Minutes Intravenous Every 48 hours 11/05/16 2144 11/06/16 0347   11/06/16 2100  cefTRIAXone (ROCEPHIN) 1 g in dextrose 5 % 50 mL IVPB  Status:  Discontinued     1 g 100 mL/hr over 30 Minutes Intravenous Every 24 hours 11/05/16 2326 11/08/16 1609   11/06/16 2100  azithromycin (ZITHROMAX) tablet 500 mg  Status:  Discontinued     500 mg Oral Every 24 hours 11/05/16 2326 11/07/16 1452   11/05/16 2200  azithromycin (ZITHROMAX) 500 mg in dextrose 5 % 250 mL IVPB     500 mg 250 mL/hr over 60 Minutes Intravenous  Once 11/05/16 2154 11/05/16 2315   11/05/16 1945  vancomycin (VANCOCIN) IVPB 1000 mg/200 mL premix     1,000 mg 200 mL/hr over 60 Minutes Intravenous  Once 11/05/16 1944 11/05/16 2209   11/05/16 1930  cefTRIAXone (ROCEPHIN) 1 g in dextrose 5 % 50 mL IVPB     1 g 100 mL/hr over 30 Minutes Intravenous  Once 11/05/16 1926 11/05/16 2023   11/05/16 1930  azithromycin (ZITHROMAX) 500 mg in dextrose 5 % 250 mL IVPB  Status:  Discontinued     500 mg 250 mL/hr over 60 Minutes Intravenous  Once 11/05/16 1926 11/05/16 2014        Objective:   Vitals:   11/29/16 0013 11/29/16 0432 11/29/16 0900 11/29/16 1112  BP: (!) 146/66 (!) 147/66  (!) 149/89  Pulse: 67 (!) 55  75  Resp: (!) 23 16  17   Temp: 98.7 F (37.1 C) 98.7 F (37.1 C)  100.1 F (37.8 C)  TempSrc: Oral Oral  Oral  SpO2: 99% 98% 97% 98%  Weight:      Height:        Wt Readings from Last 3 Encounters:  11/22/16 64.2 kg (141 lb 8.6 oz)  01/31/16 68.5 kg (151 lb)  11/23/15 69.4 kg (153 lb)     Intake/Output Summary (Last 24 hours) at 11/29/16 1148 Last data filed at 11/29/16  0700  Gross per 24 hour  Intake              930 ml  Output              700 ml  Net              230 ml    Physical Exam Awake Alert, Oriented X 3, No new F.N deficits, Normal affect [place].AT,PERRAL Supple Neck,No JVD, No cervical lymphadenopathy appriciated.  Symmetrical Chest wall movement, slightly diminished breath sounds on the right side but no wheezing, overall much better RRR,No Gallops,Rubs or new Murmurs, No Parasternal Heave +ve B.Sounds, Abd Soft, No tenderness, No organomegaly appriciated, No rebound - guarding or rigidity. No Cyanosis, Clubbing or edema, No new Rash or bruise     Data Review:   CBC  Recent Labs Lab 11/24/16 0212 11/25/16 0318 11/26/16 0155 11/28/16 0219 11/29/16 0218  WBC 6.2 6.0 7.2 8.1 7.8  HGB 8.4* 8.6* 9.3* 8.7* 8.5*  HCT 26.9* 27.7* 30.1* 27.8* 26.2*  PLT 514* 499* 565* 430* 397  MCV 87.9 88.8 89.3 89.7 88.5  MCH 27.5 27.6 27.6 28.1 28.7  MCHC 31.2 31.0 30.9 31.3 32.4  RDW 17.0* 17.1* 17.0* 17.4* 17.6*    Chemistries  Recent Labs Lab 11/24/16 0212 11/25/16 0318 11/26/16 0155 11/28/16 0219 11/29/16 0218  NA 135 137 137 135 134*  K 3.6 4.0 3.9 4.3 3.9  CL 95* 98* 99* 98* 98*  CO2 32 29 32 31 29  GLUCOSE 122* 109* 117* 128* 153*  BUN 20 19 13 11 12   CREATININE 0.61 0.54 0.51 0.51 0.61  CALCIUM 7.9* 8.0* 8.2* 8.3* 8.2*  AST  --   --  18  --   --   ALT  --   --  28  --   --   ALKPHOS  --   --  84  --   --   BILITOT  --   --  0.3  --   --    Inpatient Medications  Scheduled Meds: . acetylcysteine  4 mL Nebulization TID  . atorvastatin  20 mg Oral Daily  . budesonide (PULMICORT) nebulizer solution  0.5 mg Nebulization BID  . ceFEPime (MAXIPIME) IV  1 g Intravenous Q8H  . chlorhexidine  15 mL Mouth Rinse BID  . enoxaparin (LOVENOX) injection  40 mg Subcutaneous Q24H  . feeding supplement (ENSURE ENLIVE)  237 mL Oral BID BM  . insulin aspart  0-9 Units Subcutaneous TID WC  . ipratropium-albuterol  3 mL Nebulization TID   . mouth rinse  15 mL Mouth Rinse q12n4p  . metoprolol tartrate  25 mg Oral BID  . pantoprazole  40 mg Oral Daily  . [START ON 11/30/2016] predniSONE  5 mg Oral Q breakfast  . saccharomyces boulardii  250 mg Oral BID  . vancomycin  750 mg Intravenous Q12H   Continuous Infusions: PRN Meds:.albuterol, fentaNYL (SUBLIMAZE) injection, Gerhardt's butt cream, labetalol, ondansetron (ZOFRAN) IV  Micro Results Recent Results (from the past 240 hour(s))  C difficile quick scan w PCR reflex     Status: None   Collection Time: 11/25/16 12:46 PM  Result Value Ref Range Status   C Diff antigen NEGATIVE NEGATIVE Final   C Diff toxin NEGATIVE NEGATIVE Final   C Diff interpretation No C. difficile detected.  Final   Radiology Reports Dg Chest 2 View  Result Date: 11/28/2016 CLINICAL DATA:  Persistent cough and chest congestion. Admitted 3 weeks ago with right-sided pneumonia and atelectasis. Persistent cough and chest congestion EXAM: CHEST  2 VIEW COMPARISON:  Chest x-ray and chest CT scan of April 8th 2018 FINDINGS: There has been marked improvement in the appearance of the right lung with re-expansion noted throughout. A small right pleural effusion persists. There is a trace of pleural fluid on the left. The left lung is well-expanded and clear. The cardiac silhouette is top-normal in size. The pulmonary vascularity is normal. There is calcification in the wall of the aortic arch. The trachea is midline. IMPRESSION: Marked improvement in the appearance of the right lung. Right lower lobe infiltrate and small pleural effusion persist. A trace left pleural effusion is present. Thoracic aortic atherosclerosis. Electronically Signed   By: David  Martinique M.D.   On: 11/28/2016 09:30   Dg Chest 2 View  Result Date: 11/24/2016 CLINICAL DATA:  Recent right lower lobectomy for empyema. Currently with cough EXAM: CHEST  2 VIEW COMPARISON:  November 23, 2016 FINDINGS: There has been partial clearing of airspace  consolidation from the lung bases compared to 1 day prior. There remains patchy bibasilar atelectasis with small pleural effusions bilaterally. Lungs elsewhere are clear. Note that there is volume loss on the right consistent with previous surgery. Heart size is normal.  The pulmonary vascular is within normal limits. No adenopathy. No bone lesions. IMPRESSION: Bibasilar atelectasis with small pleural effusions. No consolidation. Stable volume loss on the right. No new opacity. Stable cardiac silhouette. No evident pneumothorax. Electronically Signed   By: Lowella Grip III M.D.   On: 11/24/2016 08:38   Dg Chest 2 View  Result Date: 11/05/2016 CLINICAL DATA:  Shortness of breath and cough. EXAM: CHEST  2 VIEW COMPARISON:  August 28, 2015 FINDINGS: The cardiomediastinal silhouette is stable. No pneumothorax. The left lung is clear. New effusion and underlying opacity seen on the right. IMPRESSION: New effusion and underlying opacity seen in the right lower lobe. No other interval changes. Recommend follow-up to resolution. Electronically Signed   By: Dorise Bullion III M.D   On: 11/05/2016 19:03   Ct Chest Wo Contrast  Result Date: 11/26/2016 CLINICAL DATA:  Right lower lobe surgery for necrotizing pneumonia 11/15/2016. Became short of breath last night with hypoxia. EXAM: CT CHEST WITHOUT CONTRAST TECHNIQUE: Multidetector CT imaging of the chest was performed following the standard protocol without IV contrast. COMPARISON:  CT 11/13/2016, 02/19/2015 and 10/13/2005 as well as recent chest x-ray 11/26/2016. FINDINGS: Cardiovascular: Interval removal of central venous catheter. Mild cardiomegaly with minimal cardiomediastinal shift to the right compatible recent right lower lobectomy. Minimal calcified plaque over the left main and left anterior descending coronary arteries. Mild calcified plaque over the thoracic aorta. Mediastinum/Nodes: 1 cm precarinal lymph node without significant change likely  reactive. No hilar adenopathy. Remaining mediastinal structures are unremarkable. Lungs/Pleura: Interval removal of endotracheal tube. Subtle patchy density over the left apex improved. Mild left basilar atelectasis. Surgical sutures over the posteromedial right lower thorax compatible with right lower lobectomy. Moderate-size right pleural effusion. Heterogeneous consolidation over the right mid to upper lung. No pneumothorax. Central right-sided bronchi not well aerated. Upper Abdomen: Unchanged. Musculoskeletal: Mild degenerative change of the spine. IMPRESSION: Postsurgical change compatible recent right lower lobectomy with volume loss of the right lung and cardiomediastinal shift to the right. Moderate-size right effusion tracking to the apex. Heterogeneous consolidation over the right mid to upper lung with decreased aeration of the central bronchi which may be due to persistent infection. Patchy opacification over the left apex improved. Left basilar atelectasis. Mild cardiomegaly. Mild atherosclerotic coronary artery disease. Aortic atherosclerosis. Electronically Signed   By: Marin Olp M.D.   On: 11/26/2016 17:04   Ct Chest Wo Contrast  Result Date: 11/13/2016 CLINICAL DATA:  65 year old female with history of loculated pleural effusion with chest tube in place. EXAM: CT CHEST WITHOUT CONTRAST TECHNIQUE: Multidetector CT imaging of the chest was performed following the standard protocol without IV contrast. COMPARISON:  Chest CT 02/19/2015. Multiple recent prior chest radiographs. FINDINGS: Cardiovascular: Heart size is normal. There is no significant pericardial fluid, thickening or pericardial calcification. There is aortic atherosclerosis, as well as atherosclerosis of the great vessels of the mediastinum and the coronary arteries, including calcified atherosclerotic plaque in the left main and left anterior descending coronary arteries. Left internal jugular central venous catheter with tip  terminating in the superior aspect of the right atrium. Mediastinum/Nodes: Patient is intubated, with the tip of the endotracheal to the lying approximately 2.1 cm above the carina. Nasogastric tube extends into the stomach. Numerous borderline enlarged and mildly enlarged mediastinal and bilateral hilar lymph nodes, measuring up to 11 mm in short axis in the subcarinal nodal station. Esophagus is unremarkable in appearance. No axillary lymphadenopathy. Lungs/Pleura: Right-sided chest tube in position with tip directed into  the medial aspect of the apex of the right hemithorax. Large complex right-sided pleural fluid and gas collection. Fluid ranges from low to intermediate attenuation, suggesting proteinaceous contents. Some of the pneumothorax component is confluent anteriorly, while other locules of gas are noted throughout the lower right hemithorax, some of which appear to be within collapsed/consolidated lung tissue, but other small locules of gas likely are present within loculated pleural fluid in the lower right hemithorax. In the medial aspect of the lower right hemithorax there is what appears to be a thick-walled cavitary area, which is favored to be within the lung parenchyma rather than in the pleural space, measuring up to 6.4 x 2.4 x 6.5 cm (axial image 83 of series 201, and coronal image 56 of series 203). Areas of bronchiectasis are noted within the right lower lobe. Within the aerated portions of the lungs there is patchy multifocal ground-glass attenuation and septal thickening. The ground-glass attenuation is most evident in a peribronchovascular distribution, likely infectious/inflammatory in etiology, most severe in the left upper lobe. No significant left pleural effusion. Upper Abdomen: Unremarkable. Musculoskeletal: Extensive emphysema is noted throughout the subcutaneous fat of the chest wall bilaterally extending up into the cervical regions bilaterally (right greater than left). There  are no aggressive appearing lytic or blastic lesions noted in the visualized portions of the skeleton. IMPRESSION: 1. Findings are most compatible with right lower lobe necrotizing pneumonia, with what appears to be a large right lower lobe cavity in the medial aspect of the basal segments of the right lower lobe, as well as a large right empyema. In this complex right pleural gas and fluid collections there both a small anterior pneumothorax, as well as multifocal loculated components of gas within the inferior aspect of the right pleural space, as above. Right-sided chest tube is directed into the medial aspect of the right apex, and the tip of the chest tube is currently surrounded by very little pleural fluid or gas. 2. Multifocal patchy interstitial and airspace disease asymmetrically distributed throughout the remaining aerated portions of the lungs, favored to reflect severe multilobar bronchopneumonia. 3. Aortic atherosclerosis, in addition to left main and left anterior descending coronary artery disease. Please note that although the presence of coronary artery calcium documents the presence of coronary artery disease, the severity of this disease and any potential stenosis cannot be assessed on this non-gated CT examination. Assessment for potential risk factor modification, dietary therapy or pharmacologic therapy may be warranted, if clinically indicated. 4. Support apparatus, as above. Electronically Signed   By: Vinnie Langton M.D.   On: 11/13/2016 15:19   US Renal  Result Date: 11/06/2016 CLINICAL DATA:  Sepsis EXAM: RENAL / URINARY TRACT ULTRASOUND COMPLETE COMPARISON:  None. FINDINGS: Right Kidney: Length: 9.4 cm, within normal limits. Echogenicity within normal limits. No mass or hydronephrosis visualized. Left Kidney: Length: 10.9 cm, within normal limits. Echogenicity within normal limits. No mass or hydronephrosis visualized. Bladder: Appears normal for degree of bladder distention.  IMPRESSION: Negative bilateral renal ultrasound. Electronically Signed   By: San Morelle M.D.   On: 11/06/2016 12:41   Dg Chest Port 1 View  Result Date: 11/26/2016 CLINICAL DATA:  Respiratory failure. Hx of COPD, partial right lung removed(11/20/16), hypertension, primary spontaneous pneumothorax. Nonsmoker, 2nd hand smoke exposure. EXAM: PORTABLE CHEST 1 VIEW COMPARISON:  Chest x-rays dated 11/25/2016 and 11/24/2016. FINDINGS: There is now complete opacification the right hemithorax, presumably large pleural effusion and/or airspace collapse, favor large pleural effusion given the stable position of  the mediastinum. There is stable volume loss on the right that is compatible with history of previous right lower lobe resection. Left lung is clear. Osseous structures about the chest are unremarkable. IMPRESSION: Complete opacification of the right hemithorax, significantly worsened compared to yesterday's exam, favor large pleural effusion. These results will be called to the ordering clinician or representative by the Radiologist Assistant, and communication documented in the PACS or zVision Dashboard. Electronically Signed   By: Franki Cabot M.D.   On: 11/26/2016 07:33   Dg Chest Port 1 View  Result Date: 11/25/2016 CLINICAL DATA:  Concern for spontaneous pneumothorax. Personal history of spontaneous pneumothorax. Shortness of breath, acute onset. Initial encounter. EXAM: PORTABLE CHEST 1 VIEW COMPARISON:  Chest radiograph performed earlier today at 6:43 a.m. FINDINGS: A small right pleural effusion is suspected, with associated airspace consolidation. This raises concern for pneumonia, new from the prior study. Minimal left basilar opacity likely reflects atelectasis or scarring, stable from the prior study. No pneumothorax is seen. The patient is status post right lower lobectomy. Underlying chronic right pleural thickening is noted. The cardiomediastinal silhouette is mildly enlarged. No acute  osseous abnormalities are identified. IMPRESSION: 1. Small right pleural effusion suspected, with associated airspace consolidation. This raises concern for pneumonia, new from the prior study. Minimal left basilar airspace opacity likely reflects atelectasis or scarring. 2. No evidence of pneumothorax. 3. Mild cardiomegaly. Electronically Signed   By: Garald Balding M.D.   On: 11/25/2016 17:46   Dg Chest Port 1 View  Result Date: 11/25/2016 CLINICAL DATA:  Respiratory failure EXAM: PORTABLE CHEST 1 VIEW COMPARISON:  Yesterday FINDINGS: Streaky densities at the bases, right more than left. Asymmetric elevation of the right diaphragm, increased. There is right pleural thickening reaching the apex. Borderline cardiomegaly, accentuated by technique. Small left effusions based on lateral film yesterday. No Kerley lines or pneumothorax. IMPRESSION: 1. Mildly increased atelectasis at the right base. 2. Right pleural thickening and lower lobectomy. Electronically Signed   By: Monte Fantasia M.D.   On: 11/25/2016 08:32   Dg Chest Port 1 View  Result Date: 11/23/2016 CLINICAL DATA:  CHEST TUBE REMOVAL/RIGHT EXAM: PORTABLE CHEST 1 VIEW COMPARISON:  11/23/2016 FINDINGS: Right chest tube has been removed. No pneumothorax. Heart size is accentuated by the AP portable technique. There are patchy densities in the lung bases bilaterally which partially obscure the hemidiaphragms, increased on the left. Opacity at the left lung base now obscures the hemidiaphragm. No pulmonary edema. IMPRESSION: Bilateral lower lobe opacities, increased on the left. No pneumothorax following removal of chest tube. Electronically Signed   By: Nolon Nations M.D.   On: 11/23/2016 12:53   Dg Chest Port 1 View  Result Date: 11/23/2016 CLINICAL DATA:  Shortness of breath.  Pleural effusion . EXAM: PORTABLE CHEST 1 VIEW COMPARISON:  11/22/2016.  11/21/2016 . FINDINGS: Right chest tube in stable position. No pneumothorax. Stable cardiomegaly.  Interim improvement of pulmonary interstitial prominence suggesting improvement of pulmonary interstitial edema. Tiny bilateral pleural effusions. Stable biapical pleural thickening most likely secondary scarring . Prominent skin folds noted over the chest. IMPRESSION: 1. Right chest tube in stable position.  No pneumothorax. 2. Stable cardiomegaly. Interim improvement of pulmonary interstitial prominence suggesting improving interstitial edema. Tiny bilateral pleural effusions again noted. Electronically Signed   By: Marcello Moores  Register   On: 11/23/2016 07:34   Dg Chest Port 1 View  Result Date: 11/22/2016 CLINICAL DATA:  66 y/o  F; increasing shortness of breath. EXAM: PORTABLE CHEST 1 VIEW  COMPARISON:  11/21/2016 chest radiograph. FINDINGS: Stable cardiac silhouette given projection and technique. Aortic atherosclerosis with calcification. Stable position of right-sided chest tube. Increase interstitial opacities in the lungs bilaterally probably represents developing interstitial pulmonary edema. Increasing opacity of the right lung base probably represents worsening pleural effusion and atelectasis. No acute osseous abnormality identified. IMPRESSION: Increase interstitial opacities probably representing developing edema. Increasing opacity of the right lung and right lung base, likely increasing pleural effusion. No appreciable pneumothorax. Electronically Signed   By: Kristine Garbe M.D.   On: 11/22/2016 05:29   Dg Chest Port 1 View  Result Date: 11/21/2016 CLINICAL DATA:  History of pneumothorax.  Chest tube removal. EXAM: PORTABLE CHEST 1 VIEW COMPARISON:  11/20/2016. FINDINGS: Interim extubation. Interim removal of the lateral most chest tube. Two medial most chest tube is in stable position. Left IJ line in stable position . Heart size stable. Right base subsegmental atelectasis. Perihilar atelectasis/infiltrate. Small bilateral pleural effusions. No pneumothorax. IMPRESSION: 1. Interim  extubation. Interim removal of lateral most chest tube. Two medial most chest tubes in stable position. Left IJ line stable position. No pneumothorax. 2. Persistent right base subsegmental atelectasis. Mild left perihilar atelectasis/ infiltrate noted on today's exam. Small bilateral pleural effusions. Electronically Signed   By: Marcello Moores  Register   On: 11/21/2016 06:45   Dg Chest Port 1 View  Result Date: 11/20/2016 CLINICAL DATA:  Status post thoracoscopy and empyema drainage with decortication on the right. EXAM: PORTABLE CHEST 1 VIEW COMPARISON:  Portable chest x-ray of November 19, 2016 FINDINGS: There remains mild volume loss on the right. There is no pneumothorax. The right-sided chest tube tip projects over the posterior aspect of the fourth rib. A second more medially positioned chest tube has its tip approximately 2 cm lateral to the hands. A third chest tube has its tip projecting over the medial aspect of the ninth rib. The left lung is well-expanded. Minimal basilar atelectasis is suspected on the left. The cardiac silhouette is top-normal in size. The pulmonary vascularity is not engorged. The endotracheal tube tip lies approximately 3.7 cm above the carina. The esophagogastric tube tip projects below the inferior margin of the image. The left internal jugular venous catheter tip projects over the distal third of the SVC. IMPRESSION: Fairly stable appearance of the chest. No right-sided pneumothorax or significant pleural effusion. Minimal postsurgical volume loss due to lower lobectomy. Minimal left basilar atelectasis. The support tubes are in reasonable position. Electronically Signed   By: David  Martinique M.D.   On: 11/20/2016 07:08   Dg Chest Port 1 View  Result Date: 11/19/2016 CLINICAL DATA:  Pneumonia EXAM: PORTABLE CHEST 1 VIEW COMPARISON:  11/18/2016 FINDINGS: Three chest tubes on the right unchanged. No pneumothorax. Minimal right effusion unchanged. Mild bibasilar airspace disease unchanged  Endotracheal tube 2.5 cm above the carina. Left jugular central venous catheter tip in the right atrium. NG tube enters the stomach. IMPRESSION: No change from yesterday. Electronically Signed   By: Franchot Gallo M.D.   On: 11/19/2016 07:07   Dg Chest Port 1 View  Result Date: 11/18/2016 CLINICAL DATA:  Evaluate ETT. EXAM: PORTABLE CHEST 1 VIEW COMPARISON:  November 17, 2016 FINDINGS: The ETT is in good position. Right-sided chest tubes remain, in good position. The enteric tube terminates below today's study but the side port is below the GE junction within the stomach. No pneumothorax. The cardiomediastinal silhouette is stable. Small bilateral pleural effusions with underlying atelectasis, unchanged. A left-sided central line terminates near the caval  atrial junction. It is possible it could extend into the right atrium. This appears to be unchanged in the interval. No overt edema. IMPRESSION: 1. Stable support apparatus. The distal tip of the right central line is near the caval atrial junction, either within the distal SVC or just within the right atrium. 2. Small bilateral effusions with underlying atelectasis remain. Electronically Signed   By: Dorise Bullion III M.D   On: 11/18/2016 07:26   Dg Chest Port 1 View  Result Date: 11/17/2016 CLINICAL DATA:  Chest 2.  Change in respirations and breath sounds EXAM: PORTABLE CHEST 1 VIEW COMPARISON:  11/17/2016 FINDINGS: Right chest tube, endotracheal tube, NG tube and left central line remain in place, unchanged. Bibasilar atelectasis is stable. No pneumothorax. Probable small effusions. IMPRESSION: No pneumothorax. Continued bibasilar atelectasis and small effusions. Electronically Signed   By: Rolm Baptise M.D.   On: 11/17/2016 12:57   Dg Chest Port 1 View  Result Date: 11/17/2016 CLINICAL DATA:  Respiratory failure EXAM: PORTABLE CHEST 1 VIEW COMPARISON:  11/16/2016 FINDINGS: Endotracheal tube, nasogastric catheter and left jugular central line are  again seen and stable. Chest tubes are noted on the right and stable. No pneumothorax is seen. Bibasilar atelectatic changes are seen. No bony abnormality is noted. IMPRESSION: Tubes and lines as described.  No pneumothorax is noted. Stable bibasilar changes. Electronically Signed   By: Inez Catalina M.D.   On: 11/17/2016 07:32   Dg Chest Port 1 View  Result Date: 11/16/2016 CLINICAL DATA:  Acute respiratory failure EXAM: PORTABLE CHEST 1 VIEW COMPARISON:  11/15/2016 FINDINGS: Cardiac shadow remains mildly enlarged. Endotracheal tube, nasogastric catheter and left jugular central line are again seen. Multiple right chest tubes are again noted and stable. No definitive pneumothorax is seen. Bibasilar infiltrative changes are noted right greater than left. The changes on the left has increased slightly in the interval from the prior exam. IMPRESSION: Postsurgical changes. No pneumothorax is seen. Bibasilar infiltrative changes are seen. Electronically Signed   By: Inez Catalina M.D.   On: 11/16/2016 07:34   Dg Chest Port 1 View  Result Date: 11/15/2016 CLINICAL DATA:  Right lower lobe lobectomy for pulmonary abscess/empyema EXAM: PORTABLE CHEST 1 VIEW COMPARISON:  11/15/2016 FINDINGS: The heart size and mediastinal contours are within normal limits. Endotracheal tube tip is slightly low lying at 2.4 cm above the carina. Pullback approximately 1 cm recommended. Left IJ central line catheter is noted at the cavoatrial junction. Two right-sided chest tubes are identified projecting over the lung apex with decrease in previously noted loculated pleural opacity along the periphery of the right hemithorax. Bibasilar atelectasis is identified. No significant pneumothorax. Gastric tube extends below the left hemidiaphragm into the expected location the stomach. The tip is excluded however. No acute osseous abnormality appear IMPRESSION: 1. Slightly low-lying endotracheal tube tip approximately 2.4 cm above the carina.  Pullback approximately 1 cm recommended. Otherwise, satisfactory support line and tube positions. These results will be called to the ordering clinician or representative by the Radiologist Assistant, and communication documented in the PACS or zVision Dashboard. 2. Residual atelectasis at the right lung base without appreciable pneumothorax status post right lower lobectomy. Electronically Signed   By: Ashley Royalty M.D.   On: 11/15/2016 19:57   Dg Chest Port 1 View  Result Date: 11/15/2016 CLINICAL DATA:  Respiratory failure. EXAM: PORTABLE CHEST 1 VIEW COMPARISON:  11/14/2016 CT 11/13/2016 . FINDINGS: Endotracheal tube, NG tube, left IJ line, right chest tube in stable position. No  definite pneumothorax identified. Previously identified anterior pneumothorax best identified on CT of 11/13/2016. Persistent basilar infiltrates, particularly prominent on the right. Persistent prominent right sided pleural effusions/possible empyema again noted. No change. IMPRESSION: 1. Lines and tubes including right chest tube in stable position. No pneumothorax identified. Previously identified anterior pneumothorax best demonstrated by prior CT of 11/13/2016. 2. Persistent loculated right pleural effusion/empyema. No acute change. Persistent basilar infiltrates, particular prominent on the right. No change. Electronically Signed   By: Marcello Moores  Register   On: 11/15/2016 06:46   Dg Chest Port 1 View  Result Date: 11/14/2016 CLINICAL DATA:  Respiratory failure. EXAM: PORTABLE CHEST 1 VIEW COMPARISON:  CT 11/13/2016.  Chest x-ray 11/13/2016. FINDINGS: Endotracheal tube, NG tube, left IJ line, right chest tube stable position. Heart size stable. Bilateral pulmonary infiltrates with right side pleural effusion/ possible empyema again noted. Previously identified anterior pneumothorax is best demonstrated by CT of 11/13/2016. Mild chest wall subcutaneous emphysema again noted. IMPRESSION: 1. Lines and tubes including right chest  tube in stable position. 2. Persistent bibasilar infiltrates. Persistent right sided prominent pleural effusion/ possible empyema again noted without significant change. Previously identified anterior pneumothorax best demonstrated about recent CT. No definite pneumothorax noted on this chest x-ray. Electronically Signed   By: Marcello Moores  Register   On: 11/14/2016 07:04   Dg Chest Port 1 View  Result Date: 11/13/2016 CLINICAL DATA:  Pneumothorax.  Shortness of breath . EXAM: PORTABLE CHEST 1 VIEW COMPARISON:  11/12/2016. FINDINGS: Endotracheal tube, left IJ line, NG tube, right chest tube in stable position. No pneumothorax. Persistent right side pleural effusion with increase in size from prior exam. Right base atelectasis. Heart size normal. Subcutaneous emphysema has improved. IMPRESSION: 1. Lines and tubes is including right chest tube in stable position. No pneumothorax. 2.  Interim increase in right pleural effusion. Electronically Signed   By: Marcello Moores  Register   On: 11/13/2016 06:47   Dg Chest Port 1 View  Result Date: 11/12/2016 CLINICAL DATA:  Patient with history of pneumothorax. EXAM: PORTABLE CHEST 1 VIEW COMPARISON:  Chest radiograph 11/11/2016 FINDINGS: ET tube terminates in the mid trachea. Central venous catheter tip projects over the right atrium. Monitoring leads overlie the patient. Right-sided chest tube projects over the right lung apex. Stable cardiac and mediastinal contours. Persistent heterogeneous opacities right mid lower lung. Heterogeneous opacities left lung base. Moderate right pleural effusion. No definite pneumothorax. Subcutaneous emphysema. IMPRESSION: Stable support apparatus. Persistent moderate right pleural effusion with underlying opacities. No visible right-sided pneumothorax. Electronically Signed   By: Lovey Newcomer M.D.   On: 11/12/2016 08:40   Dg Chest Port 1 View  Result Date: 11/11/2016 CLINICAL DATA:  Continued surveillance pneumothorax. EXAM: PORTABLE CHEST 1  VIEW COMPARISON:  11/10/2016. FINDINGS: Unchanged cardiomediastinal silhouette and support apparatus. RIGHT chest tube remains in good position in the RIGHT lung apex. Visualized pneumothorax is less, although RIGHT pleural effusion is increased. BILATERAL pulmonary opacities appear worse. Subcutaneous emphysema redemonstrated. IMPRESSION: No visible pneumothorax.  Increasing RIGHT effusion. Electronically Signed   By: Staci Righter M.D.   On: 11/11/2016 07:51   Dg Chest Port 1 View  Result Date: 11/10/2016 CLINICAL DATA:  Pneumothorax.  Chest tube. EXAM: PORTABLE CHEST 1 VIEW COMPARISON:  Chest x-ray 11/10/2016. FINDINGS: Endotracheal tube, left IJ line, NG tube, right chest tube in stable position. Heart size normal. Bilateral pulmonary infiltrates. Small right pneumothorax noted on today's exam. Diffuse chest wall subcutaneous emphysema again noted. IMPRESSION: 1. Lines and tubes including right chest tube in  stable position. Small right pneumothorax noted on today's exam. Diffuse chest wall subcutaneous emphysema noted. 2. Diffuse bilateral pulmonary infiltrates. Critical Value/emergent results were called by telephone at the time of interpretation on 11/10/2016 at 3:17 pm to nurse Anderson Malta, who verbally acknowledged these results. Electronically Signed   By: Marcello Moores  Register   On: 11/10/2016 15:19   Dg Chest Port 1 View  Result Date: 11/10/2016 CLINICAL DATA:  Respiratory failure. EXAM: PORTABLE CHEST 1 VIEW COMPARISON:  11/09/2016. FINDINGS: Endotracheal tube, NG tube, left IJ line in stable position . Right chest tube in stable position. No pneumothorax. Diffuse bilateral chest wall and neck subcutaneous emphysema. Subcutaneous emphysema has increased significantly from prior exam. IMPRESSION: 1. Lines and tubes in stable position. Right chest tube in stable position. No pneumothorax. 2. Persistent bilateral pulmonary infiltrates, right greater than left. Small right pleural effusion. No interim  change. 3. Interim significant progression of diffuse bilateral chest wall and bilateral neck subcutaneous emphysema. Electronically Signed   By: Marcello Moores  Register   On: 11/10/2016 07:13   Dg Chest Port 1 View  Result Date: 11/09/2016 CLINICAL DATA:  Post right chest tube placement EXAM: PORTABLE CHEST 1 VIEW COMPARISON:  11/09/2016 FINDINGS: Cardiomediastinal silhouette is stable. Persistent bilateral basilar infiltrates right greater than left. Small right pleural effusion. There is right chest tube in place with tip in right apex. No definite pneumothorax. Subcutaneous emphysema noted right axilla. Stable endotracheal and NG tube position. Stable left IJ central line position. IMPRESSION: Stable support apparatus. Persistent bilateral basilar infiltrates right greater than left. Small right pleural effusion. There is right chest tube in place with tip in right apex. No definite pneumothorax. Subcutaneous emphysema noted right axilla. Electronically Signed   By: Lahoma Crocker M.D.   On: 11/09/2016 10:27   Dg Chest Port 1 View  Result Date: 11/09/2016 CLINICAL DATA:  Respiratory failure EXAM: PORTABLE CHEST 1 VIEW COMPARISON:  11/08/2016 FINDINGS: Endotracheal tube, nasogastric catheter and left jugular central line are again seen and stable. Cardiac shadow is stable. Bibasilar infiltrates and effusions are seen slightly worse on the right than the left. New left upper lobe infiltrative changes are noted. A new right-sided pneumothorax is noted with approximately 2 cm excursion at the apex at and 1 cm excursion laterally. IMPRESSION: New right pneumothorax. Bibasilar infiltrates as well as new left upper lobe infiltrate are seen. Effusions are seen right greater than left. Critical Value/emergent results were called by telephone at the time of interpretation on 11/09/2016 at 7:04 am to [facility], the pts nurse, who verbally acknowledged these results and will contact primary physician. Electronically Signed   By:  Inez Catalina M.D.   On: 11/09/2016 07:05   Portable Chest Xray  Result Date: 11/08/2016 CLINICAL DATA:  Hypoxia EXAM: PORTABLE CHEST 1 VIEW COMPARISON:  November 07, 2016 FINDINGS: Endotracheal tube tip is 1.5 cm above the carina. Nasogastric tube tip and side port are below the diaphragm. Central catheter tip is at the cavoatrial junction. No pneumothorax. There is airspace consolidation in both lower lobes, not significantly changed from 1 day prior. There are small pleural effusions bilaterally. Heart size and pulmonary vascularity are normal. There is atherosclerotic calcification in the aorta. No adenopathy. No bone lesions. IMPRESSION: Tube and catheter positions as described without evident pneumothorax. Airspace opacity suspicious for pneumonia in both lower lobes. Small pleural effusions bilaterally. No interstitial edema appreciable. Heart size normal. There is aortic atherosclerosis. Electronically Signed   By: Lowella Grip III M.D.   On: 11/08/2016  07:41   Dg Chest Port 1 View  Result Date: 11/07/2016 CLINICAL DATA:  Central line placement. EXAM: PORTABLE CHEST 1 VIEW COMPARISON:  Earlier today at 1412 hours FINDINGS: 1527 hours. Left internal jugular line is difficult to follow centrally secondary to overlying wires and leads. Terminates at least at the level of the high right atrium. Endotracheal and nasogastric tubes are unchanged. Normal heart size. Atherosclerosis in the transverse aorta. Small left pleural effusion is unchanged. No pneumothorax. Bibasilar airspace disease is not significantly changed. Mild interstitial edema. IMPRESSION: Left internal jugular line is difficult to follow centrally secondary to overlying wires and leads. Followed to at least the level of the high right atrium. Consider retraction 2-3 cm with repeat imaging (ideally after removing EKG leads and wires). Otherwise, similar appearance of the chest with bibasilar airspace disease, left pleural fluid, and  interstitial edema. Electronically Signed   By: Abigail Miyamoto M.D.   On: 11/07/2016 15:56   Dg Chest Port 1 View  Result Date: 11/07/2016 CLINICAL DATA:  Endotracheal tube EXAM: PORTABLE CHEST 1 VIEW COMPARISON:  03/09/2017 FINDINGS: Small bilateral pleural effusions. Right lower lobe airspace disease. No pneumothorax. Stable cardiomediastinal silhouette. Endotracheal to with the tip 3.5 cm above the carina. Nasogastric tube coursing below the diaphragm. IMPRESSION: 1. Small bilateral pleural effusions. Right lower lobe airspace disease concerning for pneumonia. 2. Small bilateral pleural effusions. Electronically Signed   By: Kathreen Devoid   On: 11/07/2016 14:29   Dg Chest Port 1 View  Result Date: 11/07/2016 CLINICAL DATA:  Pleural effusion EXAM: PORTABLE CHEST 1 VIEW COMPARISON:  Chest radiograph 11/05/2016 FINDINGS: Unchanged cardiomediastinal contours. Small right pleural effusion and associated basilar consolidation is unchanged. The left lung is clear. IMPRESSION: Unchanged small right pleural effusion and right basilar consolidation. Electronically Signed   By: Ulyses Jarred M.D.   On: 11/07/2016 02:43   Dg Abd Portable 1v  Result Date: 11/07/2016 CLINICAL DATA:  Enteric tube placement EXAM: PORTABLE ABDOMEN - 1 VIEW COMPARISON:  None. FINDINGS: Enteric tube terminates in the distal body of the stomach. No disproportionately dilated small bowel loops. Mild colonic stool volume. No evidence of pneumatosis or pneumoperitoneum. Patchy bibasilar lung opacities. IMPRESSION: 1. Enteric tube terminates in the distal body of the stomach. 2. Nonobstructive bowel gas pattern. 3. Nonspecific patchy bibasilar lung opacities, correlate with chest radiograph. Electronically Signed   By: Ilona Sorrel M.D.   On: 11/07/2016 14:29   Ir US Chest  Result Date: 11/27/2016 CLINICAL DATA:  Patient with recent VATS and chest tube on the right side. Recent CT scan concerning for possible pleural effusion. Request is  made for thoracentesis. EXAM: CHEST ULTRASOUND COMPARISON:  None. FINDINGS: Small area concerning for either lung consolidation or fluid with sedimentation present within the right inferior chest. IMPRESSION: Small area concerning for either lung consolidation or fluid with sedimentation within the right inferior chest cavity. This was confirmed with Dr. Barbie Banner. Thoracentesis was deferred. If the patient's kidney function is normal and this needs to be further evaluated, he would recommend a CT scan of the chest with contrast as the most recent was without contrast. Read by: Saverio Danker, PA-C Electronically Signed   By: Marybelle Killings M.D.   On: 11/27/2016 13:29   Ir US Chest  Result Date: 11/06/2016 CLINICAL DATA:  Patient admitted with right-sided pneumonia. Recent imaging suggested possible right-sided pleural effusion. Request is made for thoracentesis. EXAM: CHEST ULTRASOUND COMPARISON:  None. FINDINGS: Minimal right-sided effusion. Most of what is seen is  consolidation of the right lung secondary to pneumonia. IMPRESSION: Unable to perform thoracentesis secondary to minimal effusion. Read by: Saverio Danker, PA-C Electronically Signed   By: Aletta Edouard M.D.   On: 11/06/2016 11:57   Time Spent in minutes  30 min  Rebecca Eaton Magick- M.D on 11/29/2016 at 11:48 AM  Between 7am to 7pm - Pager - [phone]  After 7pm go to [email or web address] - password [facility]  [facility] -  Office  [phone]

## 2016-11-30 ENCOUNTER — Inpatient Hospital Stay (HOSPITAL_COMMUNITY): Payer: Medicare Other

## 2016-11-30 LAB — CBC
HEMATOCRIT: 26.8 % — AB (ref 36.0–46.0)
HEMOGLOBIN: 8.3 g/dL — AB (ref 12.0–15.0)
MCH: 27.4 pg (ref 26.0–34.0)
MCHC: 31 g/dL (ref 30.0–36.0)
MCV: 88.4 fL (ref 78.0–100.0)
PLATELETS: 426 10*3/uL — AB (ref 150–400)
RBC: 3.03 MIL/uL — AB (ref 3.87–5.11)
RDW: 17 % — ABNORMAL HIGH (ref 11.5–15.5)
WBC: 8.6 10*3/uL (ref 4.0–10.5)

## 2016-11-30 LAB — BASIC METABOLIC PANEL
ANION GAP: 5 (ref 5–15)
BUN: 13 mg/dL (ref 6–20)
CHLORIDE: 99 mmol/L — AB (ref 101–111)
CO2: 30 mmol/L (ref 22–32)
Calcium: 7.9 mg/dL — ABNORMAL LOW (ref 8.9–10.3)
Creatinine, Ser: 0.55 mg/dL (ref 0.44–1.00)
GFR calc Af Amer: >60 mL/min (ref >60)
GLUCOSE: 126 mg/dL — AB (ref 65–99)
POTASSIUM: 3.9 mmol/L (ref 3.5–5.1)
Sodium: 134 mmol/L — ABNORMAL LOW (ref 135–145)

## 2016-11-30 LAB — GLUCOSE, CAPILLARY
GLUCOSE-CAPILLARY: 155 mg/dL — AB (ref 65–99)
GLUCOSE-CAPILLARY: 169 mg/dL — AB (ref 65–99)
GLUCOSE-CAPILLARY: 157 mg/dL — AB (ref 65–99)

## 2016-11-30 NOTE — Progress Notes (Signed)
Patient ID: Diana Gardner, female   DOB: October 11, 1950, 66 y.o.   MRN: [id number]                                                                PROGRESS NOTE                                                                                                        Patient Demographics:    Joselynn Amoroso, is a 66 y.o. female, DOB - 11-Mar-1951, ERD:[id number]  Admit date - 11/05/2016   Admitting Physician Edwin Dada, MD  Outpatient Primary MD for the patient is [name], MD  LOS - 25  Chief Complaint  Patient presents with  . Shortness of Breath      Brief Narrative    66 y.o.femalewith a PMH of COPD, chronic respiratory failure on home oxygen who was admitted 11/05/16 for a right lower lobe necrotizing community-acquired pneumonia complicated by pneumothorax and empyema status post right facets and right lower lobectomy on 11/15/16. She was intubated from 11/07/16 arrow forward 11/20/16 and under the care of the critical care team. She also required pressor support, which was weaned 11/09/16. Care transferred to [facility].   SIGNIFICANT EVENTS:  3/18 Admitted 3/20 transfer to ICU and intubated 3/21 refractory shock 3/22 Right PTX > chest tube placed. Pressors stopped 3/28 VATS, right mini thoracotomy, drainage of empyema, decortication, right lower lobectomy. 4/02 Extubated 4/03 Re-admitted to ICU for acute hypercarbic respiratory failure. Improved on BiPAP. 4/07 PCCM asked to see for desatn/mucus plug   Subjective:   Pt reports feeling better, had some rest last night and feels less short of breath.    Assessment  & Plan :   Acute on chronic hypoxemic hypercarbic respiratory failure Right lower lobe necrotizing pneumonia Right empyema status post VATS decortication and right lower lobe lobectomy  Right Pleural Effusion vs mucous plug  - CTS reviewed CXR and CT from 4/8, thought to be primarily atelectasis/ consolidation with a small amount of fluid, which would be expected  after a lobecomtomy - IR attempted thoracentesis but unable to perform on 4/9 as not enough fluid to aspirate - CXR 4/10 with notable for an improvement in the appearance of the right lung and with re expansion noted throughout - pt is also clinically better, ambulated yesterday and said she did well  - per PCCM, Continue chest PT > vest, flutter valve, mucomyst - Continue Duoneb Q4  - No role for FOB at this time  - continue vancomycin, cefepime day #6 (started on 11/25/2016) - appreciate PCCM and CTS teams input  - plan to change to Augmentin in next 24 hours and continue until follow up with repeat imaging - Follow up with Pulmonary at discharge > see discharge section for details  Sepsis/septic shock secondary to necrotizing right lower lung  pneumonia/empyema (admission 3/18) - Vanco/Rocephin/zithromax 3/18=>3/21 - Vanco 3/28=>3/29 - Unasyn 3/21=>4/7 - Chest tube 3/22 secondary to right pneumothorax - R VATS, R minithoracotomy with drainage of empyema, decortication, right lower lobectomy 3/28 - Intubated 3/28, Extubated 4/2 - Sputum 3/18 negative - Blood cx 3/18 negative - Respiratory virus panel negative - Urine strep antigen positive - 2-4 week course of unasyn recommended by critical care team but due to Acute respiratory failure 4/7, started Vanco /cefepime 4/7=> - continue same regimen per PCCM for now with plan to change to PO Augmentin in next 24 hours  - pt overall clinically improving   Copd w exacerbation - Currently on prednisone taper, Duoneb, Pulmicort, Nacetylcysteine - overall stable   Acute renal failure secondary to nsaid and ace inhibitor - resolved   Hyponatremia  - overall stable - will monitor   Normocytic anemia - Hg is down a bit in the past 24 hours but no signs of active bleeding - CBC in AM  Hyperlipidemia - Cont lipitor  Hypertension - Cont metoprolol - reasonable inpatient control   Steroid induced hyperglycemia - Cont  ssi  Thrombocytosis  - From infection - improving, Plt WNL this AM  - CBC in AM  Code Status : Full code  Family Communication  : w patient, no family at bedside  Disposition Plan  :  Home eventually  Barriers For Discharge : pending an improvement in respiratory status   Consults  :  pccm, CT surgery  Procedures  :  Chest tube 3/22 secondary to right pneumothorax  2-D echo 11/08/16 estimated ejection fraction was in the range of 65% to 70%. grade 1 DD  11/15/16 PROCEDURE:  BRONCHOSCOPY RIGHT VATS/ MINI THORRACOTOMY DRAINAGE OF EMPYEMA AND DECORTICATION RIGHT LOWER LOBECTOMY  DVT Prophylaxis     Lovenox -  SCDs   Cultures 3/20 Tracheal aspirate >> NGTD x 2 days 3/21 Respiratory viral panel negative 3/21 Blood cultures x 2 >> NGTD x 5 days 3/28 R pleural fluid NGTD x 5 days  Antibiotics  Vanco/Rocephin/zithromax 3/18 => 3/21 Vanco 3/28 => 3/29 Unasyn 3/21 =>4 /7 Vancomycin 4/7 => Cefepime 4/7 =>  Anti-infectives    Start     Dose/Rate Route Frequency Ordered Stop   11/26/16 0600  vancomycin (VANCOCIN) IVPB 750 mg/150 ml premix     750 mg 150 mL/hr over 60 Minutes Intravenous Every 12 hours 11/25/16 1810     11/25/16 1830  vancomycin (VANCOCIN) IVPB 1000 mg/200 mL premix     1,000 mg 200 mL/hr over 60 Minutes Intravenous  Once 11/25/16 1810 11/25/16 2100   11/25/16 1830  ceFEPIme (MAXIPIME) 1 g in dextrose 5 % 50 mL IVPB     1 g 100 mL/hr over 30 Minutes Intravenous Every 8 hours 11/25/16 1810     11/16/16 0000  vancomycin (VANCOCIN) IVPB 1000 mg/200 mL premix     1,000 mg 200 mL/hr over 60 Minutes Intravenous Every 12 hours 11/15/16 1711 11/16/16 0100   11/15/16 0715  vancomycin (VANCOCIN) IVPB 1000 mg/200 mL premix     1,000 mg 200 mL/hr over 60 Minutes Intravenous On call to O.R. 11/15/16 0706 11/15/16 1315   11/10/16 0142  Ampicillin-Sulbactam (UNASYN) 3 g in sodium chloride 0.9 % 100 mL IVPB  Status:  Discontinued     3 g 200 mL/hr over 30 Minutes  Intravenous Every 8 hours 11/09/16 1809 11/25/16 1758   11/08/16 1700  Ampicillin-Sulbactam (UNASYN) 3 g in sodium chloride 0.9 % 100 mL IVPB  Status:  Discontinued     3 g 200 mL/hr over 30 Minutes Intravenous Every 12 hours 11/08/16 1632 11/09/16 1809   11/07/16 2100  vancomycin (VANCOCIN) IVPB 1000 mg/200 mL premix  Status:  Discontinued     1,000 mg 200 mL/hr over 60 Minutes Intravenous Every 48 hours 11/05/16 2144 11/06/16 0347   11/06/16 2100  cefTRIAXone (ROCEPHIN) 1 g in dextrose 5 % 50 mL IVPB  Status:  Discontinued     1 g 100 mL/hr over 30 Minutes Intravenous Every 24 hours 11/05/16 2326 11/08/16 1609   11/06/16 2100  azithromycin (ZITHROMAX) tablet 500 mg  Status:  Discontinued     500 mg Oral Every 24 hours 11/05/16 2326 11/07/16 1452   11/05/16 2200  azithromycin (ZITHROMAX) 500 mg in dextrose 5 % 250 mL IVPB     500 mg 250 mL/hr over 60 Minutes Intravenous  Once 11/05/16 2154 11/05/16 2315   11/05/16 1945  vancomycin (VANCOCIN) IVPB 1000 mg/200 mL premix     1,000 mg 200 mL/hr over 60 Minutes Intravenous  Once 11/05/16 1944 11/05/16 2209   11/05/16 1930  cefTRIAXone (ROCEPHIN) 1 g in dextrose 5 % 50 mL IVPB     1 g 100 mL/hr over 30 Minutes Intravenous  Once 11/05/16 1926 11/05/16 2023   11/05/16 1930  azithromycin (ZITHROMAX) 500 mg in dextrose 5 % 250 mL IVPB  Status:  Discontinued     500 mg 250 mL/hr over 60 Minutes Intravenous  Once 11/05/16 1926 11/05/16 2014        Objective:   Vitals:   11/30/16 0100 11/30/16 0316 11/30/16 0800 11/30/16 0941  BP: (!) 160/76 (!) 159/67    Pulse: 62 (!) 59 64   Resp: 20 18 19    Temp: 98.8 F (37.1 C) 98.7 F (37.1 C)    TempSrc: Oral Oral    SpO2: 98% 99% 100% 100%  Weight:  52.3 kg (115 lb 6.4 oz)    Height:        Wt Readings from Last 3 Encounters:  11/30/16 52.3 kg (115 lb 6.4 oz)  01/31/16 68.5 kg (151 lb)  11/23/15 69.4 kg (153 lb)     Intake/Output Summary (Last 24 hours) at 11/30/16 1127 Last data filed  at 11/30/16 1000  Gross per 24 hour  Intake              690 ml  Output              400 ml  Net              290 ml    Physical Exam Awake Alert, Oriented X 3, No new F.N deficits, Normal affect [place].AT,PERRAL Supple Neck,No JVD, No cervical lymphadenopathy appriciated.  Symmetrical Chest wall movement, slightly diminished breath sounds on the right side but no wheezing, overall much better RRR,No Gallops,Rubs or new Murmurs, No Parasternal Heave +ve B.Sounds, Abd Soft, No tenderness, No organomegaly appriciated, No rebound - guarding or rigidity. No Cyanosis, Clubbing or edema, No new Rash or bruise     Data Review:   CBC  Recent Labs Lab 11/25/16 0318 11/26/16 0155 11/28/16 0219 11/29/16 0218 11/30/16 0212  WBC 6.0 7.2 8.1 7.8 8.6  HGB 8.6* 9.3* 8.7* 8.5* 8.3*  HCT 27.7* 30.1* 27.8* 26.2* 26.8*  PLT 499* 565* 430* 397 426*  MCV 88.8 89.3 89.7 88.5 88.4  MCH 27.6 27.6 28.1 28.7 27.4  MCHC 31.0 30.9 31.3 32.4 31.0  RDW 17.1* 17.0* 17.4*  17.6* 17.0*    Chemistries   Recent Labs Lab 11/25/16 0318 11/26/16 0155 11/28/16 0219 11/29/16 0218 11/30/16 0212  NA 137 137 135 134* 134*  K 4.0 3.9 4.3 3.9 3.9  CL 98* 99* 98* 98* 99*  CO2 29 32 31 29 30   GLUCOSE 109* 117* 128* 153* 126*  BUN 19 13 11 12 13   CREATININE 0.54 0.51 0.51 0.61 0.55  CALCIUM 8.0* 8.2* 8.3* 8.2* 7.9*  AST  --  18  --   --   --   ALT  --  28  --   --   --   ALKPHOS  --  84  --   --   --   BILITOT  --  0.3  --   --   --    Inpatient Medications  Scheduled Meds: . acetylcysteine  4 mL Nebulization TID  . atorvastatin  20 mg Oral Daily  . budesonide (PULMICORT) nebulizer solution  0.5 mg Nebulization BID  . ceFEPime (MAXIPIME) IV  1 g Intravenous Q8H  . chlorhexidine  15 mL Mouth Rinse BID  . enoxaparin (LOVENOX) injection  40 mg Subcutaneous Q24H  . feeding supplement (ENSURE ENLIVE)  237 mL Oral BID BM  . insulin aspart  0-9 Units Subcutaneous TID WC  . ipratropium-albuterol  3 mL  Nebulization TID  . mouth rinse  15 mL Mouth Rinse q12n4p  . metoprolol tartrate  25 mg Oral BID  . predniSONE  5 mg Oral Q breakfast  . saccharomyces boulardii  250 mg Oral BID  . vancomycin  750 mg Intravenous Q12H   Continuous Infusions: PRN Meds:.albuterol, fentaNYL (SUBLIMAZE) injection, Gerhardt's butt cream, labetalol, ondansetron (ZOFRAN) IV  Micro Results Recent Results (from the past 240 hour(s))  C difficile quick scan w PCR reflex     Status: None   Collection Time: 11/25/16 12:46 PM  Result Value Ref Range Status   C Diff antigen NEGATIVE NEGATIVE Final   C Diff toxin NEGATIVE NEGATIVE Final   C Diff interpretation No C. difficile detected.  Final   Radiology Reports Dg Chest 2 View  Result Date: 11/30/2016 CLINICAL DATA:  Empyema. Hx of COPD, empyema, hypertension, and spontaneous pneumothorax. Pt had a video assisted thoracoscopy and mini thoracotomy with drainage of empyema in the right lower lobe of right lung in march 2018. EXAM: CHEST - 2 VIEW COMPARISON:  11/28/2016 FINDINGS: Small right pleural effusion as before. There is adjacent subsegmental atelectasis or consolidation at the right lung base. Heart size and mediastinal contours are within normal limits. No pneumothorax. Osteopenia. IMPRESSION: Persistent small right pleural effusion and adjacent right lower lobe consolidation/atelectasis. Electronically Signed   By: Lucrezia Europe M.D.   On: 11/30/2016 07:59   Dg Chest 2 View  Result Date: 11/28/2016 CLINICAL DATA:  Persistent cough and chest congestion. Admitted 3 weeks ago with right-sided pneumonia and atelectasis. Persistent cough and chest congestion EXAM: CHEST  2 VIEW COMPARISON:  Chest x-ray and chest CT scan of April 8th 2018 FINDINGS: There has been marked improvement in the appearance of the right lung with re-expansion noted throughout. A small right pleural effusion persists. There is a trace of pleural fluid on the left. The left lung is well-expanded and  clear. The cardiac silhouette is top-normal in size. The pulmonary vascularity is normal. There is calcification in the wall of the aortic arch. The trachea is midline. IMPRESSION: Marked improvement in the appearance of the right lung. Right lower lobe infiltrate and small  pleural effusion persist. A trace left pleural effusion is present. Thoracic aortic atherosclerosis. Electronically Signed   By: David  Martinique M.D.   On: 11/28/2016 09:30   Dg Chest 2 View  Result Date: 11/24/2016 CLINICAL DATA:  Recent right lower lobectomy for empyema. Currently with cough EXAM: CHEST  2 VIEW COMPARISON:  November 23, 2016 FINDINGS: There has been partial clearing of airspace consolidation from the lung bases compared to 1 day prior. There remains patchy bibasilar atelectasis with small pleural effusions bilaterally. Lungs elsewhere are clear. Note that there is volume loss on the right consistent with previous surgery. Heart size is normal. The pulmonary vascular is within normal limits. No adenopathy. No bone lesions. IMPRESSION: Bibasilar atelectasis with small pleural effusions. No consolidation. Stable volume loss on the right. No new opacity. Stable cardiac silhouette. No evident pneumothorax. Electronically Signed   By: Lowella Grip III M.D.   On: 11/24/2016 08:38   Dg Chest 2 View  Result Date: 11/05/2016 CLINICAL DATA:  Shortness of breath and cough. EXAM: CHEST  2 VIEW COMPARISON:  August 28, 2015 FINDINGS: The cardiomediastinal silhouette is stable. No pneumothorax. The left lung is clear. New effusion and underlying opacity seen on the right. IMPRESSION: New effusion and underlying opacity seen in the right lower lobe. No other interval changes. Recommend follow-up to resolution. Electronically Signed   By: Dorise Bullion III M.D   On: 11/05/2016 19:03   Ct Chest Wo Contrast  Result Date: 11/26/2016 CLINICAL DATA:  Right lower lobe surgery for necrotizing pneumonia 11/15/2016. Became short of breath last  night with hypoxia. EXAM: CT CHEST WITHOUT CONTRAST TECHNIQUE: Multidetector CT imaging of the chest was performed following the standard protocol without IV contrast. COMPARISON:  CT 11/13/2016, 02/19/2015 and 10/13/2005 as well as recent chest x-ray 11/26/2016. FINDINGS: Cardiovascular: Interval removal of central venous catheter. Mild cardiomegaly with minimal cardiomediastinal shift to the right compatible recent right lower lobectomy. Minimal calcified plaque over the left main and left anterior descending coronary arteries. Mild calcified plaque over the thoracic aorta. Mediastinum/Nodes: 1 cm precarinal lymph node without significant change likely reactive. No hilar adenopathy. Remaining mediastinal structures are unremarkable. Lungs/Pleura: Interval removal of endotracheal tube. Subtle patchy density over the left apex improved. Mild left basilar atelectasis. Surgical sutures over the posteromedial right lower thorax compatible with right lower lobectomy. Moderate-size right pleural effusion. Heterogeneous consolidation over the right mid to upper lung. No pneumothorax. Central right-sided bronchi not well aerated. Upper Abdomen: Unchanged. Musculoskeletal: Mild degenerative change of the spine. IMPRESSION: Postsurgical change compatible recent right lower lobectomy with volume loss of the right lung and cardiomediastinal shift to the right. Moderate-size right effusion tracking to the apex. Heterogeneous consolidation over the right mid to upper lung with decreased aeration of the central bronchi which may be due to persistent infection. Patchy opacification over the left apex improved. Left basilar atelectasis. Mild cardiomegaly. Mild atherosclerotic coronary artery disease. Aortic atherosclerosis. Electronically Signed   By: Marin Olp M.D.   On: 11/26/2016 17:04   Ct Chest Wo Contrast  Result Date: 11/13/2016 CLINICAL DATA:  66 year old female with history of loculated pleural effusion with chest  tube in place. EXAM: CT CHEST WITHOUT CONTRAST TECHNIQUE: Multidetector CT imaging of the chest was performed following the standard protocol without IV contrast. COMPARISON:  Chest CT 02/19/2015. Multiple recent prior chest radiographs. FINDINGS: Cardiovascular: Heart size is normal. There is no significant pericardial fluid, thickening or pericardial calcification. There is aortic atherosclerosis, as well as atherosclerosis of the  great vessels of the mediastinum and the coronary arteries, including calcified atherosclerotic plaque in the left main and left anterior descending coronary arteries. Left internal jugular central venous catheter with tip terminating in the superior aspect of the right atrium. Mediastinum/Nodes: Patient is intubated, with the tip of the endotracheal to the lying approximately 2.1 cm above the carina. Nasogastric tube extends into the stomach. Numerous borderline enlarged and mildly enlarged mediastinal and bilateral hilar lymph nodes, measuring up to 11 mm in short axis in the subcarinal nodal station. Esophagus is unremarkable in appearance. No axillary lymphadenopathy. Lungs/Pleura: Right-sided chest tube in position with tip directed into the medial aspect of the apex of the right hemithorax. Large complex right-sided pleural fluid and gas collection. Fluid ranges from low to intermediate attenuation, suggesting proteinaceous contents. Some of the pneumothorax component is confluent anteriorly, while other locules of gas are noted throughout the lower right hemithorax, some of which appear to be within collapsed/consolidated lung tissue, but other small locules of gas likely are present within loculated pleural fluid in the lower right hemithorax. In the medial aspect of the lower right hemithorax there is what appears to be a thick-walled cavitary area, which is favored to be within the lung parenchyma rather than in the pleural space, measuring up to 6.4 x 2.4 x 6.5 cm (axial image  83 of series 201, and coronal image 56 of series 203). Areas of bronchiectasis are noted within the right lower lobe. Within the aerated portions of the lungs there is patchy multifocal ground-glass attenuation and septal thickening. The ground-glass attenuation is most evident in a peribronchovascular distribution, likely infectious/inflammatory in etiology, most severe in the left upper lobe. No significant left pleural effusion. Upper Abdomen: Unremarkable. Musculoskeletal: Extensive emphysema is noted throughout the subcutaneous fat of the chest wall bilaterally extending up into the cervical regions bilaterally (right greater than left). There are no aggressive appearing lytic or blastic lesions noted in the visualized portions of the skeleton. IMPRESSION: 1. Findings are most compatible with right lower lobe necrotizing pneumonia, with what appears to be a large right lower lobe cavity in the medial aspect of the basal segments of the right lower lobe, as well as a large right empyema. In this complex right pleural gas and fluid collections there both a small anterior pneumothorax, as well as multifocal loculated components of gas within the inferior aspect of the right pleural space, as above. Right-sided chest tube is directed into the medial aspect of the right apex, and the tip of the chest tube is currently surrounded by very little pleural fluid or gas. 2. Multifocal patchy interstitial and airspace disease asymmetrically distributed throughout the remaining aerated portions of the lungs, favored to reflect severe multilobar bronchopneumonia. 3. Aortic atherosclerosis, in addition to left main and left anterior descending coronary artery disease. Please note that although the presence of coronary artery calcium documents the presence of coronary artery disease, the severity of this disease and any potential stenosis cannot be assessed on this non-gated CT examination. Assessment for potential risk factor  modification, dietary therapy or pharmacologic therapy may be warranted, if clinically indicated. 4. Support apparatus, as above. Electronically Signed   By: Vinnie Langton M.D.   On: 11/13/2016 15:19   US Renal  Result Date: 11/06/2016 CLINICAL DATA:  Sepsis EXAM: RENAL / URINARY TRACT ULTRASOUND COMPLETE COMPARISON:  None. FINDINGS: Right Kidney: Length: 9.4 cm, within normal limits. Echogenicity within normal limits. No mass or hydronephrosis visualized. Left Kidney: Length: 10.9 cm, within  normal limits. Echogenicity within normal limits. No mass or hydronephrosis visualized. Bladder: Appears normal for degree of bladder distention. IMPRESSION: Negative bilateral renal ultrasound. Electronically Signed   By: San Morelle M.D.   On: 11/06/2016 12:41   Dg Chest Port 1 View  Result Date: 11/26/2016 CLINICAL DATA:  Respiratory failure. Hx of COPD, partial right lung removed(11/20/16), hypertension, primary spontaneous pneumothorax. Nonsmoker, 2nd hand smoke exposure. EXAM: PORTABLE CHEST 1 VIEW COMPARISON:  Chest x-rays dated 11/25/2016 and 11/24/2016. FINDINGS: There is now complete opacification the right hemithorax, presumably large pleural effusion and/or airspace collapse, favor large pleural effusion given the stable position of the mediastinum. There is stable volume loss on the right that is compatible with history of previous right lower lobe resection. Left lung is clear. Osseous structures about the chest are unremarkable. IMPRESSION: Complete opacification of the right hemithorax, significantly worsened compared to yesterday's exam, favor large pleural effusion. These results will be called to the ordering clinician or representative by the Radiologist Assistant, and communication documented in the PACS or zVision Dashboard. Electronically Signed   By: Franki Cabot M.D.   On: 11/26/2016 07:33   Dg Chest Port 1 View  Result Date: 11/25/2016 CLINICAL DATA:  Concern for spontaneous  pneumothorax. Personal history of spontaneous pneumothorax. Shortness of breath, acute onset. Initial encounter. EXAM: PORTABLE CHEST 1 VIEW COMPARISON:  Chest radiograph performed earlier today at 6:43 a.m. FINDINGS: A small right pleural effusion is suspected, with associated airspace consolidation. This raises concern for pneumonia, new from the prior study. Minimal left basilar opacity likely reflects atelectasis or scarring, stable from the prior study. No pneumothorax is seen. The patient is status post right lower lobectomy. Underlying chronic right pleural thickening is noted. The cardiomediastinal silhouette is mildly enlarged. No acute osseous abnormalities are identified. IMPRESSION: 1. Small right pleural effusion suspected, with associated airspace consolidation. This raises concern for pneumonia, new from the prior study. Minimal left basilar airspace opacity likely reflects atelectasis or scarring. 2. No evidence of pneumothorax. 3. Mild cardiomegaly. Electronically Signed   By: Garald Balding M.D.   On: 11/25/2016 17:46   Dg Chest Port 1 View  Result Date: 11/25/2016 CLINICAL DATA:  Respiratory failure EXAM: PORTABLE CHEST 1 VIEW COMPARISON:  Yesterday FINDINGS: Streaky densities at the bases, right more than left. Asymmetric elevation of the right diaphragm, increased. There is right pleural thickening reaching the apex. Borderline cardiomegaly, accentuated by technique. Small left effusions based on lateral film yesterday. No Kerley lines or pneumothorax. IMPRESSION: 1. Mildly increased atelectasis at the right base. 2. Right pleural thickening and lower lobectomy. Electronically Signed   By: Monte Fantasia M.D.   On: 11/25/2016 08:32   Dg Chest Port 1 View  Result Date: 11/23/2016 CLINICAL DATA:  CHEST TUBE REMOVAL/RIGHT EXAM: PORTABLE CHEST 1 VIEW COMPARISON:  11/23/2016 FINDINGS: Right chest tube has been removed. No pneumothorax. Heart size is accentuated by the AP portable technique.  There are patchy densities in the lung bases bilaterally which partially obscure the hemidiaphragms, increased on the left. Opacity at the left lung base now obscures the hemidiaphragm. No pulmonary edema. IMPRESSION: Bilateral lower lobe opacities, increased on the left. No pneumothorax following removal of chest tube. Electronically Signed   By: Nolon Nations M.D.   On: 11/23/2016 12:53   Dg Chest Port 1 View  Result Date: 11/23/2016 CLINICAL DATA:  Shortness of breath.  Pleural effusion . EXAM: PORTABLE CHEST 1 VIEW COMPARISON:  11/22/2016.  11/21/2016 . FINDINGS: Right chest tube in  stable position. No pneumothorax. Stable cardiomegaly. Interim improvement of pulmonary interstitial prominence suggesting improvement of pulmonary interstitial edema. Tiny bilateral pleural effusions. Stable biapical pleural thickening most likely secondary scarring . Prominent skin folds noted over the chest. IMPRESSION: 1. Right chest tube in stable position.  No pneumothorax. 2. Stable cardiomegaly. Interim improvement of pulmonary interstitial prominence suggesting improving interstitial edema. Tiny bilateral pleural effusions again noted. Electronically Signed   By: Marcello Moores  Register   On: 11/23/2016 07:34   Dg Chest Port 1 View  Result Date: 11/22/2016 CLINICAL DATA:  66 y/o  F; increasing shortness of breath. EXAM: PORTABLE CHEST 1 VIEW COMPARISON:  11/21/2016 chest radiograph. FINDINGS: Stable cardiac silhouette given projection and technique. Aortic atherosclerosis with calcification. Stable position of right-sided chest tube. Increase interstitial opacities in the lungs bilaterally probably represents developing interstitial pulmonary edema. Increasing opacity of the right lung base probably represents worsening pleural effusion and atelectasis. No acute osseous abnormality identified. IMPRESSION: Increase interstitial opacities probably representing developing edema. Increasing opacity of the right lung and right  lung base, likely increasing pleural effusion. No appreciable pneumothorax. Electronically Signed   By: Kristine Garbe M.D.   On: 11/22/2016 05:29   Dg Chest Port 1 View  Result Date: 11/21/2016 CLINICAL DATA:  History of pneumothorax.  Chest tube removal. EXAM: PORTABLE CHEST 1 VIEW COMPARISON:  11/20/2016. FINDINGS: Interim extubation. Interim removal of the lateral most chest tube. Two medial most chest tube is in stable position. Left IJ line in stable position . Heart size stable. Right base subsegmental atelectasis. Perihilar atelectasis/infiltrate. Small bilateral pleural effusions. No pneumothorax. IMPRESSION: 1. Interim extubation. Interim removal of lateral most chest tube. Two medial most chest tubes in stable position. Left IJ line stable position. No pneumothorax. 2. Persistent right base subsegmental atelectasis. Mild left perihilar atelectasis/ infiltrate noted on today's exam. Small bilateral pleural effusions. Electronically Signed   By: Marcello Moores  Register   On: 11/21/2016 06:45   Dg Chest Port 1 View  Result Date: 11/20/2016 CLINICAL DATA:  Status post thoracoscopy and empyema drainage with decortication on the right. EXAM: PORTABLE CHEST 1 VIEW COMPARISON:  Portable chest x-ray of November 19, 2016 FINDINGS: There remains mild volume loss on the right. There is no pneumothorax. The right-sided chest tube tip projects over the posterior aspect of the fourth rib. A second more medially positioned chest tube has its tip approximately 2 cm lateral to the hands. A third chest tube has its tip projecting over the medial aspect of the ninth rib. The left lung is well-expanded. Minimal basilar atelectasis is suspected on the left. The cardiac silhouette is top-normal in size. The pulmonary vascularity is not engorged. The endotracheal tube tip lies approximately 3.7 cm above the carina. The esophagogastric tube tip projects below the inferior margin of the image. The left internal jugular venous  catheter tip projects over the distal third of the SVC. IMPRESSION: Fairly stable appearance of the chest. No right-sided pneumothorax or significant pleural effusion. Minimal postsurgical volume loss due to lower lobectomy. Minimal left basilar atelectasis. The support tubes are in reasonable position. Electronically Signed   By: David  Martinique M.D.   On: 11/20/2016 07:08   Dg Chest Port 1 View  Result Date: 11/19/2016 CLINICAL DATA:  Pneumonia EXAM: PORTABLE CHEST 1 VIEW COMPARISON:  11/18/2016 FINDINGS: Three chest tubes on the right unchanged. No pneumothorax. Minimal right effusion unchanged. Mild bibasilar airspace disease unchanged Endotracheal tube 2.5 cm above the carina. Left jugular central venous catheter tip in the  right atrium. NG tube enters the stomach. IMPRESSION: No change from yesterday. Electronically Signed   By: Franchot Gallo M.D.   On: 11/19/2016 07:07   Dg Chest Port 1 View  Result Date: 11/18/2016 CLINICAL DATA:  Evaluate ETT. EXAM: PORTABLE CHEST 1 VIEW COMPARISON:  November 17, 2016 FINDINGS: The ETT is in good position. Right-sided chest tubes remain, in good position. The enteric tube terminates below today's study but the side port is below the GE junction within the stomach. No pneumothorax. The cardiomediastinal silhouette is stable. Small bilateral pleural effusions with underlying atelectasis, unchanged. A left-sided central line terminates near the caval atrial junction. It is possible it could extend into the right atrium. This appears to be unchanged in the interval. No overt edema. IMPRESSION: 1. Stable support apparatus. The distal tip of the right central line is near the caval atrial junction, either within the distal SVC or just within the right atrium. 2. Small bilateral effusions with underlying atelectasis remain. Electronically Signed   By: Dorise Bullion III M.D   On: 11/18/2016 07:26   Dg Chest Port 1 View  Result Date: 11/17/2016 CLINICAL DATA:  Chest 2.   Change in respirations and breath sounds EXAM: PORTABLE CHEST 1 VIEW COMPARISON:  11/17/2016 FINDINGS: Right chest tube, endotracheal tube, NG tube and left central line remain in place, unchanged. Bibasilar atelectasis is stable. No pneumothorax. Probable small effusions. IMPRESSION: No pneumothorax. Continued bibasilar atelectasis and small effusions. Electronically Signed   By: Rolm Baptise M.D.   On: 11/17/2016 12:57   Dg Chest Port 1 View  Result Date: 11/17/2016 CLINICAL DATA:  Respiratory failure EXAM: PORTABLE CHEST 1 VIEW COMPARISON:  11/16/2016 FINDINGS: Endotracheal tube, nasogastric catheter and left jugular central line are again seen and stable. Chest tubes are noted on the right and stable. No pneumothorax is seen. Bibasilar atelectatic changes are seen. No bony abnormality is noted. IMPRESSION: Tubes and lines as described.  No pneumothorax is noted. Stable bibasilar changes. Electronically Signed   By: Inez Catalina M.D.   On: 11/17/2016 07:32   Dg Chest Port 1 View  Result Date: 11/16/2016 CLINICAL DATA:  Acute respiratory failure EXAM: PORTABLE CHEST 1 VIEW COMPARISON:  11/15/2016 FINDINGS: Cardiac shadow remains mildly enlarged. Endotracheal tube, nasogastric catheter and left jugular central line are again seen. Multiple right chest tubes are again noted and stable. No definitive pneumothorax is seen. Bibasilar infiltrative changes are noted right greater than left. The changes on the left has increased slightly in the interval from the prior exam. IMPRESSION: Postsurgical changes. No pneumothorax is seen. Bibasilar infiltrative changes are seen. Electronically Signed   By: Inez Catalina M.D.   On: 11/16/2016 07:34   Dg Chest Port 1 View  Result Date: 11/15/2016 CLINICAL DATA:  Right lower lobe lobectomy for pulmonary abscess/empyema EXAM: PORTABLE CHEST 1 VIEW COMPARISON:  11/15/2016 FINDINGS: The heart size and mediastinal contours are within normal limits. Endotracheal tube tip is  slightly low lying at 2.4 cm above the carina. Pullback approximately 1 cm recommended. Left IJ central line catheter is noted at the cavoatrial junction. Two right-sided chest tubes are identified projecting over the lung apex with decrease in previously noted loculated pleural opacity along the periphery of the right hemithorax. Bibasilar atelectasis is identified. No significant pneumothorax. Gastric tube extends below the left hemidiaphragm into the expected location the stomach. The tip is excluded however. No acute osseous abnormality appear IMPRESSION: 1. Slightly low-lying endotracheal tube tip approximately 2.4 cm above the carina.  Pullback approximately 1 cm recommended. Otherwise, satisfactory support line and tube positions. These results will be called to the ordering clinician or representative by the Radiologist Assistant, and communication documented in the PACS or zVision Dashboard. 2. Residual atelectasis at the right lung base without appreciable pneumothorax status post right lower lobectomy. Electronically Signed   By: Ashley Royalty M.D.   On: 11/15/2016 19:57   Dg Chest Port 1 View  Result Date: 11/15/2016 CLINICAL DATA:  Respiratory failure. EXAM: PORTABLE CHEST 1 VIEW COMPARISON:  11/14/2016 CT 11/13/2016 . FINDINGS: Endotracheal tube, NG tube, left IJ line, right chest tube in stable position. No definite pneumothorax identified. Previously identified anterior pneumothorax best identified on CT of 11/13/2016. Persistent basilar infiltrates, particularly prominent on the right. Persistent prominent right sided pleural effusions/possible empyema again noted. No change. IMPRESSION: 1. Lines and tubes including right chest tube in stable position. No pneumothorax identified. Previously identified anterior pneumothorax best demonstrated by prior CT of 11/13/2016. 2. Persistent loculated right pleural effusion/empyema. No acute change. Persistent basilar infiltrates, particular prominent on the  right. No change. Electronically Signed   By: Marcello Moores  Register   On: 11/15/2016 06:46   Dg Chest Port 1 View  Result Date: 11/14/2016 CLINICAL DATA:  Respiratory failure. EXAM: PORTABLE CHEST 1 VIEW COMPARISON:  CT 11/13/2016.  Chest x-ray 11/13/2016. FINDINGS: Endotracheal tube, NG tube, left IJ line, right chest tube stable position. Heart size stable. Bilateral pulmonary infiltrates with right side pleural effusion/ possible empyema again noted. Previously identified anterior pneumothorax is best demonstrated by CT of 11/13/2016. Mild chest wall subcutaneous emphysema again noted. IMPRESSION: 1. Lines and tubes including right chest tube in stable position. 2. Persistent bibasilar infiltrates. Persistent right sided prominent pleural effusion/ possible empyema again noted without significant change. Previously identified anterior pneumothorax best demonstrated about recent CT. No definite pneumothorax noted on this chest x-ray. Electronically Signed   By: Marcello Moores  Register   On: 11/14/2016 07:04   Dg Chest Port 1 View  Result Date: 11/13/2016 CLINICAL DATA:  Pneumothorax.  Shortness of breath . EXAM: PORTABLE CHEST 1 VIEW COMPARISON:  11/12/2016. FINDINGS: Endotracheal tube, left IJ line, NG tube, right chest tube in stable position. No pneumothorax. Persistent right side pleural effusion with increase in size from prior exam. Right base atelectasis. Heart size normal. Subcutaneous emphysema has improved. IMPRESSION: 1. Lines and tubes is including right chest tube in stable position. No pneumothorax. 2.  Interim increase in right pleural effusion. Electronically Signed   By: Marcello Moores  Register   On: 11/13/2016 06:47   Dg Chest Port 1 View  Result Date: 11/12/2016 CLINICAL DATA:  Patient with history of pneumothorax. EXAM: PORTABLE CHEST 1 VIEW COMPARISON:  Chest radiograph 11/11/2016 FINDINGS: ET tube terminates in the mid trachea. Central venous catheter tip projects over the right atrium. Monitoring  leads overlie the patient. Right-sided chest tube projects over the right lung apex. Stable cardiac and mediastinal contours. Persistent heterogeneous opacities right mid lower lung. Heterogeneous opacities left lung base. Moderate right pleural effusion. No definite pneumothorax. Subcutaneous emphysema. IMPRESSION: Stable support apparatus. Persistent moderate right pleural effusion with underlying opacities. No visible right-sided pneumothorax. Electronically Signed   By: Lovey Newcomer M.D.   On: 11/12/2016 08:40   Dg Chest Port 1 View  Result Date: 11/11/2016 CLINICAL DATA:  Continued surveillance pneumothorax. EXAM: PORTABLE CHEST 1 VIEW COMPARISON:  11/10/2016. FINDINGS: Unchanged cardiomediastinal silhouette and support apparatus. RIGHT chest tube remains in good position in the RIGHT lung apex. Visualized pneumothorax is less,  although RIGHT pleural effusion is increased. BILATERAL pulmonary opacities appear worse. Subcutaneous emphysema redemonstrated. IMPRESSION: No visible pneumothorax.  Increasing RIGHT effusion. Electronically Signed   By: Staci Righter M.D.   On: 11/11/2016 07:51   Dg Chest Port 1 View  Result Date: 11/10/2016 CLINICAL DATA:  Pneumothorax.  Chest tube. EXAM: PORTABLE CHEST 1 VIEW COMPARISON:  Chest x-ray 11/10/2016. FINDINGS: Endotracheal tube, left IJ line, NG tube, right chest tube in stable position. Heart size normal. Bilateral pulmonary infiltrates. Small right pneumothorax noted on today's exam. Diffuse chest wall subcutaneous emphysema again noted. IMPRESSION: 1. Lines and tubes including right chest tube in stable position. Small right pneumothorax noted on today's exam. Diffuse chest wall subcutaneous emphysema noted. 2. Diffuse bilateral pulmonary infiltrates. Critical Value/emergent results were called by telephone at the time of interpretation on 11/10/2016 at 3:17 pm to nurse Anderson Malta, who verbally acknowledged these results. Electronically Signed   By: Marcello Moores   Register   On: 11/10/2016 15:19   Dg Chest Port 1 View  Result Date: 11/10/2016 CLINICAL DATA:  Respiratory failure. EXAM: PORTABLE CHEST 1 VIEW COMPARISON:  11/09/2016. FINDINGS: Endotracheal tube, NG tube, left IJ line in stable position . Right chest tube in stable position. No pneumothorax. Diffuse bilateral chest wall and neck subcutaneous emphysema. Subcutaneous emphysema has increased significantly from prior exam. IMPRESSION: 1. Lines and tubes in stable position. Right chest tube in stable position. No pneumothorax. 2. Persistent bilateral pulmonary infiltrates, right greater than left. Small right pleural effusion. No interim change. 3. Interim significant progression of diffuse bilateral chest wall and bilateral neck subcutaneous emphysema. Electronically Signed   By: Marcello Moores  Register   On: 11/10/2016 07:13   Dg Chest Port 1 View  Result Date: 11/09/2016 CLINICAL DATA:  Post right chest tube placement EXAM: PORTABLE CHEST 1 VIEW COMPARISON:  11/09/2016 FINDINGS: Cardiomediastinal silhouette is stable. Persistent bilateral basilar infiltrates right greater than left. Small right pleural effusion. There is right chest tube in place with tip in right apex. No definite pneumothorax. Subcutaneous emphysema noted right axilla. Stable endotracheal and NG tube position. Stable left IJ central line position. IMPRESSION: Stable support apparatus. Persistent bilateral basilar infiltrates right greater than left. Small right pleural effusion. There is right chest tube in place with tip in right apex. No definite pneumothorax. Subcutaneous emphysema noted right axilla. Electronically Signed   By: Lahoma Crocker M.D.   On: 11/09/2016 10:27   Dg Chest Port 1 View  Result Date: 11/09/2016 CLINICAL DATA:  Respiratory failure EXAM: PORTABLE CHEST 1 VIEW COMPARISON:  11/08/2016 FINDINGS: Endotracheal tube, nasogastric catheter and left jugular central line are again seen and stable. Cardiac shadow is stable.  Bibasilar infiltrates and effusions are seen slightly worse on the right than the left. New left upper lobe infiltrative changes are noted. A new right-sided pneumothorax is noted with approximately 2 cm excursion at the apex at and 1 cm excursion laterally. IMPRESSION: New right pneumothorax. Bibasilar infiltrates as well as new left upper lobe infiltrate are seen. Effusions are seen right greater than left. Critical Value/emergent results were called by telephone at the time of interpretation on 11/09/2016 at 7:04 am to [facility], the pts nurse, who verbally acknowledged these results and will contact primary physician. Electronically Signed   By: Inez Catalina M.D.   On: 11/09/2016 07:05   Portable Chest Xray  Result Date: 11/08/2016 CLINICAL DATA:  Hypoxia EXAM: PORTABLE CHEST 1 VIEW COMPARISON:  November 07, 2016 FINDINGS: Endotracheal tube tip is 1.5 cm above the carina.  Nasogastric tube tip and side port are below the diaphragm. Central catheter tip is at the cavoatrial junction. No pneumothorax. There is airspace consolidation in both lower lobes, not significantly changed from 1 day prior. There are small pleural effusions bilaterally. Heart size and pulmonary vascularity are normal. There is atherosclerotic calcification in the aorta. No adenopathy. No bone lesions. IMPRESSION: Tube and catheter positions as described without evident pneumothorax. Airspace opacity suspicious for pneumonia in both lower lobes. Small pleural effusions bilaterally. No interstitial edema appreciable. Heart size normal. There is aortic atherosclerosis. Electronically Signed   By: Lowella Grip III M.D.   On: 11/08/2016 07:41   Dg Chest Port 1 View  Result Date: 11/07/2016 CLINICAL DATA:  Central line placement. EXAM: PORTABLE CHEST 1 VIEW COMPARISON:  Earlier today at 1412 hours FINDINGS: 1527 hours. Left internal jugular line is difficult to follow centrally secondary to overlying wires and leads. Terminates at least at  the level of the high right atrium. Endotracheal and nasogastric tubes are unchanged. Normal heart size. Atherosclerosis in the transverse aorta. Small left pleural effusion is unchanged. No pneumothorax. Bibasilar airspace disease is not significantly changed. Mild interstitial edema. IMPRESSION: Left internal jugular line is difficult to follow centrally secondary to overlying wires and leads. Followed to at least the level of the high right atrium. Consider retraction 2-3 cm with repeat imaging (ideally after removing EKG leads and wires). Otherwise, similar appearance of the chest with bibasilar airspace disease, left pleural fluid, and interstitial edema. Electronically Signed   By: Abigail Miyamoto M.D.   On: 11/07/2016 15:56   Dg Chest Port 1 View  Result Date: 11/07/2016 CLINICAL DATA:  Endotracheal tube EXAM: PORTABLE CHEST 1 VIEW COMPARISON:  03/09/2017 FINDINGS: Small bilateral pleural effusions. Right lower lobe airspace disease. No pneumothorax. Stable cardiomediastinal silhouette. Endotracheal to with the tip 3.5 cm above the carina. Nasogastric tube coursing below the diaphragm. IMPRESSION: 1. Small bilateral pleural effusions. Right lower lobe airspace disease concerning for pneumonia. 2. Small bilateral pleural effusions. Electronically Signed   By: Kathreen Devoid   On: 11/07/2016 14:29   Dg Chest Port 1 View  Result Date: 11/07/2016 CLINICAL DATA:  Pleural effusion EXAM: PORTABLE CHEST 1 VIEW COMPARISON:  Chest radiograph 11/05/2016 FINDINGS: Unchanged cardiomediastinal contours. Small right pleural effusion and associated basilar consolidation is unchanged. The left lung is clear. IMPRESSION: Unchanged small right pleural effusion and right basilar consolidation. Electronically Signed   By: Ulyses Jarred M.D.   On: 11/07/2016 02:43   Dg Abd Portable 1v  Result Date: 11/07/2016 CLINICAL DATA:  Enteric tube placement EXAM: PORTABLE ABDOMEN - 1 VIEW COMPARISON:  None. FINDINGS: Enteric tube  terminates in the distal body of the stomach. No disproportionately dilated small bowel loops. Mild colonic stool volume. No evidence of pneumatosis or pneumoperitoneum. Patchy bibasilar lung opacities. IMPRESSION: 1. Enteric tube terminates in the distal body of the stomach. 2. Nonobstructive bowel gas pattern. 3. Nonspecific patchy bibasilar lung opacities, correlate with chest radiograph. Electronically Signed   By: Ilona Sorrel M.D.   On: 11/07/2016 14:29   Ir US Chest  Result Date: 11/27/2016 CLINICAL DATA:  Patient with recent VATS and chest tube on the right side. Recent CT scan concerning for possible pleural effusion. Request is made for thoracentesis. EXAM: CHEST ULTRASOUND COMPARISON:  None. FINDINGS: Small area concerning for either lung consolidation or fluid with sedimentation present within the right inferior chest. IMPRESSION: Small area concerning for either lung consolidation or fluid with sedimentation within the right  inferior chest cavity. This was confirmed with Dr. Barbie Banner. Thoracentesis was deferred. If the patient's kidney function is normal and this needs to be further evaluated, he would recommend a CT scan of the chest with contrast as the most recent was without contrast. Read by: Saverio Danker, PA-C Electronically Signed   By: Marybelle Killings M.D.   On: 11/27/2016 13:29   Ir US Chest  Result Date: 11/06/2016 CLINICAL DATA:  Patient admitted with right-sided pneumonia. Recent imaging suggested possible right-sided pleural effusion. Request is made for thoracentesis. EXAM: CHEST ULTRASOUND COMPARISON:  None. FINDINGS: Minimal right-sided effusion. Most of what is seen is consolidation of the right lung secondary to pneumonia. IMPRESSION: Unable to perform thoracentesis secondary to minimal effusion. Read by: Saverio Danker, PA-C Electronically Signed   By: Aletta Edouard M.D.   On: 11/06/2016 11:57   Time Spent in minutes  30 min  Rebecca Eaton Magick- M.D on 11/30/2016 at 11:27  AM  Between 7am to 7pm - Pager - [phone]  After 7pm go to [email or web address] - password [facility]  [facility] -  Office  [phone]

## 2016-11-30 NOTE — Care Management Note (Signed)
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  Pt is now ventilated - no family at bedside    Expected Discharge Date:                  Expected Discharge Plan:     In-House Referral:     Discharge planning Services  CM Consult  Post Acute Care Choice:    Choice offered to:     DME Arranged:    DME Agency:     HH Arranged:    HH Agency:     Status of Service:  In process, will continue to follow  If discussed at Long Length of Stay Meetings, dates discussed:    Additional Comments: 11/30/2016  Pt is independent from home and wants to return home with her adult son per CSW.  CSW consulted daughter and she will "discuss it with her mom".  CM left PT sticky note requesting assessment for home discharge  11/24/16 Pt is s/p VATS - CT removed, IV antibiotics.   SNF recommended - CSW consulted  11/15/16 Pt went for VATS procedure today Maryclare Labrador, RN 11/30/2016, 1:51 PM

## 2016-11-30 NOTE — NC FL2 (Signed)
[place] LEVEL OF CARE SCREENING TOOL     IDENTIFICATION  Patient Name: Diana Gardner Birthdate: 12/15/1950 Sex: female Admission Date (Current Location): 11/05/2016  [facility] and [place] Number:  Herbalist and Address:  [facility], [place] [address], [place], [place] [address]      Provider Number: [id number]  Attending Physician Name and Address:  Theodis Blaze, MD  Relative Name and Phone Number:       Current Level of Care: Hospital Recommended Level of Care: [place] Prior Approval Number:    Date Approved/Denied:   PASRR Number: [id number]  Discharge Plan: SNF    Current Diagnoses: Patient Active Problem List   Diagnosis Date Noted  . HCAP (healthcare-associated pneumonia)   . Pleural effusion   . Thrombocytosis ([place]) 11/25/2016  . Normocytic anemia 11/25/2016  . Steroid-induced hyperglycemia 11/23/2016  . Empyema ([place])   . 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 Chest tube in place   . Primary spontaneous pneumothorax   . Sepsis ([place]) 11/05/2016  . Acute hypoxemic respiratory failure ([place]) 11/05/2016  . Acute renal failure (ARF) ([place]) 11/05/2016  . Hyponatremia 11/05/2016  . Hyperkalemia 11/05/2016  . Chronic obstructive pulmonary disease ([place]) 01/31/2016    Orientation RESPIRATION BLADDER Height & Weight     Self, Time, Situation, Place  O2 (Nasal Cannula, 2L) Continent Weight: 115 lb 6.4 oz (52.3 kg) Height:  5\' 4"  (162.6 cm)  BEHAVIORAL SYMPTOMS/MOOD NEUROLOGICAL BOWEL NUTRITION STATUS      Continent  (Please see d/c summary)  AMBULATORY STATUS COMMUNICATION OF NEEDS Skin   Limited Assist Verbally Surgical wounds (Closed incision right chest, guaze dressing)                       Personal Care Assistance Level of Assistance  Bathing, Feeding, Dressing Bathing Assistance: Limited assistance Feeding assistance: Independent Dressing Assistance: Limited assistance      Functional Limitations Info  Sight, Hearing, Speech Sight Info: Adequate Hearing Info: Adequate Speech Info: Adequate    SPECIAL CARE FACTORS FREQUENCY  PT (By licensed PT), OT (By licensed OT)     PT Frequency: 3x OT Frequency: 3x            Contractures Contractures Info: Not present    Additional Factors Info  Code Status, Allergies Code Status Info: [place] Code Allergies Info: No known allergies           Current Medications (11/30/2016):  This is the current hospital active medication list Current Facility-Administered Medications  Medication Dose Route Frequency Provider Last Rate Last Dose  . acetylcysteine (MUCOMYST) 20 % nebulizer / oral solution 4 mL  4 mL Nebulization TID Jose Shirl Harris, MD   4 mL at 11/30/16 [phone]  . albuterol (PROVENTIL) (2.5 MG/3ML) 0.083% nebulizer solution 2.5 mg  2.5 mg Nebulization Q2H PRN Edwin Dada, MD   2.5 mg at 11/22/16 0500  . atorvastatin (LIPITOR) tablet 20 mg  20 mg Oral Daily Rush Farmer, MD   20 mg at 11/30/16 0859  . budesonide (PULMICORT) nebulizer solution 0.5 mg  0.5 mg Nebulization BID Donita Brooks, NP   0.5 mg at 11/30/16 0937  . ceFEPIme (MAXIPIME) 1 g in dextrose 5 % 50 mL IVPB  1 g Intravenous Q8H Lauren D Bajbus, RPH   1 g at 11/30/16 0140  . chlorhexidine (PERIDEX) 0.12 % solution 15 mL  15 mL Mouth Rinse  BID Anders Simmonds, MD   15 mL at 11/30/16 0859  . enoxaparin (LOVENOX) injection 40 mg  40 mg Subcutaneous Q24H Melrose Nakayama, MD   40 mg at 11/29/16 0905  . feeding supplement (ENSURE ENLIVE) (ENSURE ENLIVE) liquid 237 mL  237 mL Oral BID BM Collene Gobble, MD   237 mL at 11/27/16 1642  . fentaNYL (SUBLIMAZE) injection 50 mcg  50 mcg Intravenous Q2H PRN Anders Simmonds, MD   50 mcg at 11/30/16 0327  . Gerhardt's butt cream   Topical PRN Melrose Nakayama, MD      . insulin aspart (novoLOG) injection 0-9 Units  0-9 Units Subcutaneous TID WC Bethany Molt, DO   2 Units at 11/30/16 0900   . ipratropium-albuterol (DUONEB) 0.5-2.5 (3) MG/3ML nebulizer solution 3 mL  3 mL Nebulization TID Jose Shirl Harris, MD   3 mL at 11/30/16 [phone]  . labetalol (NORMODYNE,TRANDATE) injection 10 mg  10 mg Intravenous Q4H PRN Javier Glazier, MD   10 mg at 11/22/16 0504  . MEDLINE mouth rinse  15 mL Mouth Rinse q12n4p Anders Simmonds, MD   15 mL at 11/29/16 1618  . metoprolol tartrate (LOPRESSOR) tablet 25 mg  25 mg Oral BID Jani Gravel, MD   25 mg at 11/30/16 0859  . ondansetron (ZOFRAN) injection 4 mg  4 mg Intravenous Q6H PRN Wayne E Gold, PA-C      . predniSONE (DELTASONE) tablet 5 mg  5 mg Oral Q breakfast Rush Farmer, MD   5 mg at 11/30/16 0859  . saccharomyces boulardii (FLORASTOR) capsule 250 mg  250 mg Oral BID Jani Gravel, MD   250 mg at 11/30/16 0858  . vancomycin (VANCOCIN) IVPB 750 mg/150 ml premix  750 mg Intravenous Q12H Lauren D Bajbus, RPH   750 mg at 11/30/16 0541     Discharge Medications: Please see discharge summary for a list of discharge medications.  Relevant Imaging Results:  Relevant Lab Results:   Additional Information SSN: [id number]  Eileen Stanford, LCSW

## 2016-11-30 NOTE — Progress Notes (Signed)
[address]       [place] [address]             [phone]      15 Days Post-Op Procedure(s) (LRB): VIDEO BRONCHOSCOPY (N/A) VIDEO ASSISTED THORACOSCOPY (VATS), MINI THORACOTOMY, DRAINAGE OF EMPYEMA, DECORTICATION, RIGHT LOWER LOBE RESECTION (Right) Subjective: conts to look and feel stronger  Objective: Vital signs in last 24 hours: Temp:  [97.3 F (36.3 C)-100.1 F (37.8 C)] 98.7 F (37.1 C) (04/12 0316) Pulse Rate:  [59-75] 59 (04/12 0316) Cardiac Rhythm: Sinus bradycardia (04/12 0706) Resp:  [17-24] 18 (04/12 0316) BP: (149-160)/(66-89) 159/67 (04/12 0316) SpO2:  [97 %-99 %] 99 % (04/12 0316) Weight:  [115 lb 6.4 oz (52.3 kg)] 115 lb 6.4 oz (52.3 kg) (04/12 0316)  Hemodynamic parameters for last 24 hours:    Intake/Output from previous day: 04/11 0701 - 04/12 0700 In: 450 [IV Piggyback:450] Out: -  Intake/Output this shift: No intake/output data recorded.  General appearance: alert, cooperative and no distress Heart: regular rate and rhythm Lungs: coarse and dim in bases Abdomen: benign Extremities: no edema or calf tenderness Wound: incis healing well  Lab Results:  Recent Labs  11/29/16 0218 11/30/16 0212  WBC 7.8 8.6  HGB 8.5* 8.3*  HCT 26.2* 26.8*  PLT 397 426*   BMET:  Recent Labs  11/29/16 0218 11/30/16 0212  NA 134* 134*  K 3.9 3.9  CL 98* 99*  CO2 29 30  GLUCOSE 153* 126*  BUN 12 13  CREATININE 0.61 0.55  CALCIUM 8.2* 7.9*    PT/INR: No results for input(s): LABPROT, INR in the last 72 hours. ABG    Component Value Date/Time   PHART 7.334 (L) 11/22/2016 0506   HCO3 32.1 (H) 11/22/2016 0506   TCO2 29 11/16/2016 1139   ACIDBASEDEF 12.0 (H) 11/07/2016 2005   O2SAT 82.1 11/22/2016 0506   CBG (last 3)   Recent Labs  11/29/16 1249 11/29/16 1625 11/29/16 2125  GLUCAP 177* 157* 163*    Meds Scheduled Meds: . acetylcysteine  4 mL Nebulization TID  . atorvastatin  20 mg Oral Daily  . budesonide  (PULMICORT) nebulizer solution  0.5 mg Nebulization BID  . ceFEPime (MAXIPIME) IV  1 g Intravenous Q8H  . chlorhexidine  15 mL Mouth Rinse BID  . enoxaparin (LOVENOX) injection  40 mg Subcutaneous Q24H  . feeding supplement (ENSURE ENLIVE)  237 mL Oral BID BM  . insulin aspart  0-9 Units Subcutaneous TID WC  . ipratropium-albuterol  3 mL Nebulization TID  . mouth rinse  15 mL Mouth Rinse q12n4p  . metoprolol tartrate  25 mg Oral BID  . pantoprazole  40 mg Oral Daily  . predniSONE  5 mg Oral Q breakfast  . saccharomyces boulardii  250 mg Oral BID  . vancomycin  750 mg Intravenous Q12H   Continuous Infusions: PRN Meds:.albuterol, fentaNYL (SUBLIMAZE) injection, Gerhardt's butt cream, labetalol, ondansetron (ZOFRAN) IV  Xrays Dg Chest 2 View  Result Date: 11/30/2016 CLINICAL DATA:  Empyema. Hx of COPD, empyema, hypertension, and spontaneous pneumothorax. Pt had a video assisted thoracoscopy and mini thoracotomy with drainage of empyema in the right lower lobe of right lung in march 2018. EXAM: CHEST - 2 VIEW COMPARISON:  11/28/2016 FINDINGS: Small right pleural effusion as before. There is adjacent subsegmental atelectasis or consolidation at the right lung base. Heart size and mediastinal contours are within normal limits. No pneumothorax. Osteopenia. IMPRESSION: Persistent small right pleural effusion and  adjacent right lower lobe consolidation/atelectasis. Electronically Signed   By: Lucrezia Europe M.D.   On: 11/30/2016 07:59   Dg Chest 2 View  Result Date: 11/28/2016 CLINICAL DATA:  Persistent cough and chest congestion. Admitted 3 weeks ago with right-sided pneumonia and atelectasis. Persistent cough and chest congestion EXAM: CHEST  2 VIEW COMPARISON:  Chest x-ray and chest CT scan of April 8th 2018 FINDINGS: There has been marked improvement in the appearance of the right lung with re-expansion noted throughout. A small right pleural effusion persists. There is a trace of pleural fluid on the  left. The left lung is well-expanded and clear. The cardiac silhouette is top-normal in size. The pulmonary vascularity is normal. There is calcification in the wall of the aortic arch. The trachea is midline. IMPRESSION: Marked improvement in the appearance of the right lung. Right lower lobe infiltrate and small pleural effusion persist. A trace left pleural effusion is present. Thoracic aortic atherosclerosis. Electronically Signed   By: David  Martinique M.D.   On: 11/28/2016 09:30    Assessment/Plan: S/P Procedure(s) (LRB): VIDEO BRONCHOSCOPY (N/A) VIDEO ASSISTED THORACOSCOPY (VATS), MINI THORACOTOMY, DRAINAGE OF EMPYEMA, DECORTICATION, RIGHT LOWER LOBE RESECTION (Right)  1 conts with slow/steady progress 2 tm 100.1, no leukocytosis- on maxipime 3 CXR is stable in appearance 4 push pulm toilet/rehab 5 med management per primary service   LOS: 25 days    , E 11/30/2016

## 2016-11-30 NOTE — Progress Notes (Signed)
      [address]       [place], [address]             [phone]      Up in chair eating lunch.  Feels well, denies pain. + cough but no mucous  BP (!) 159/67 (BP Location: Left Arm)   Pulse 64   Temp 98.7 F (37.1 C) (Oral)   Resp (!) 24   Ht 5\' 4"  (1.626 m)   Wt 115 lb 6.4 oz (52.3 kg)   SpO2 100%   BMI 19.81 kg/m   Low grade temp at 1100 yesterday  Lungs decreased at right base o/w clear  CXR shows some increased atelectasis +/- effusion at right base  Doing well.  No surgical issues at present  [place]. Roxan Hockey, MD [facility] [phone]

## 2016-11-30 NOTE — Progress Notes (Signed)
Pt encouraged to continue using flutter while awake

## 2016-11-30 NOTE — Clinical Social Work Note (Signed)
Clinical Social Work Assessment  Patient Details  Name: Diana Gardner MRN: [id number] Date of Birth: 05/28/1951  Date of referral:  11/30/16               Reason for consult:  Facility Placement                Permission sought to share information with:  Facility Contact Representative, Family Supports Permission granted to share information::  Yes, Verbal Permission Granted  Name::     [name]  Agency::  SNF  Relationship::  daughter  Contact Information:     Housing/Transportation Living arrangements for the past 2 months:  Single Family Home Source of Information:  Patient Patient Interpreter Needed:  None Criminal Activity/Legal Involvement Pertinent to Current Situation/Hospitalization:  No - Comment as needed Significant Relationships:  Adult Children Lives with:  Adult Children Do you feel safe going back to the place where you live?  Yes Need for family participation in patient care:  Yes (Comment)  Care giving concerns:  Patient resided at home with adult son.  Patient indicated that prior to hospitalization she was independent with ADL's and driving in the community.  She states that her son can help at home and declines a SNF placement.  Patient gave CSW permission to speak with daughter Leslie.  Social Worker assessment / plan:  CSW met with patient today who was Ao4 and engaged appropriately.  CSW discussed DC plan and placement. Pt indicated that she has never gone to SNF and does not feel that she needs it at this time and wants to return home.  Patient gave CSW permission to speak with daughter who advised she will check in with mom when she returns to hospital. She will follow-up with mom to discuss further.  Daughter advised that her brother has some health issues and may not be available to assist with needs at home.  Employment status:  Retired Insurance information:  Managed Medicare PT Recommendations:  Skilled Nursing Facility Information / Referral  to community resources:  Skilled Nursing Facility  Patient/Family's Response to care:  Patient and daughter report no concerns at this time with care. Patient and daughter aware of SNF options and recommendations.    Patient/Family's Understanding of and Emotional Response to Diagnosis, Current Treatment, and Prognosis:  Patient and family understand diagnosis, current treatment and prognosis. They have clear understanding of the recommendations and patient declines SNF placement at this time and desires to return home at DC.  Emotional Assessment Appearance:  Appears stated age Attitude/Demeanor/Rapport:  Other (Cooperative) Affect (typically observed):  Accepting, Appropriate Orientation:  Oriented to Self, Oriented to Place, Oriented to  Time, Oriented to Situation Alcohol / Substance use:  Not Applicable Psych involvement (Current and /or in the community):  No (Comment)  Discharge Needs  Concerns to be addressed:  Care Coordination Readmission within the last 30 days:  No Current discharge risk:  Physical Impairment, Dependent with Mobility Barriers to Discharge:  Other    V , LCSW 11/30/2016, 1:38 PM  

## 2016-12-01 LAB — CBC
HCT: 29.3 % — ABNORMAL LOW (ref 36.0–46.0)
Hemoglobin: 8.9 g/dL — ABNORMAL LOW (ref 12.0–15.0)
MCH: 27.1 pg (ref 26.0–34.0)
MCHC: 30.4 g/dL (ref 30.0–36.0)
MCV: 89.3 fL (ref 78.0–100.0)
PLATELETS: 394 10*3/uL (ref 150–400)
RBC: 3.28 MIL/uL — ABNORMAL LOW (ref 3.87–5.11)
RDW: 16.7 % — AB (ref 11.5–15.5)
WBC: 9.3 10*3/uL (ref 4.0–10.5)

## 2016-12-01 LAB — GLUCOSE, CAPILLARY
GLUCOSE-CAPILLARY: 116 mg/dL — AB (ref 65–99)
GLUCOSE-CAPILLARY: 144 mg/dL — AB (ref 65–99)
Glucose-Capillary: 147 mg/dL — ABNORMAL HIGH (ref 65–99)
Glucose-Capillary: 312 mg/dL — ABNORMAL HIGH (ref 65–99)

## 2016-12-01 LAB — BASIC METABOLIC PANEL
ANION GAP: 5 (ref 5–15)
BUN: 14 mg/dL (ref 6–20)
CO2: 30 mmol/L (ref 22–32)
Calcium: 8.3 mg/dL — ABNORMAL LOW (ref 8.9–10.3)
Chloride: 98 mmol/L — ABNORMAL LOW (ref 101–111)
Creatinine, Ser: 0.54 mg/dL (ref 0.44–1.00)
GLUCOSE: 118 mg/dL — AB (ref 65–99)
Potassium: 4.1 mmol/L (ref 3.5–5.1)
Sodium: 133 mmol/L — ABNORMAL LOW (ref 135–145)

## 2016-12-01 MED ORDER — OXYCODONE-ACETAMINOPHEN 5-325 MG PO TABS
1.0000 | ORAL_TABLET | Freq: Four times a day (QID) | ORAL | Status: DC | PRN
  Administered 2016-12-01 – 2016-12-03 (×3): 1 via ORAL
  Filled 2016-12-01 (×3): qty 1

## 2016-12-01 NOTE — Care Management Note (Signed)
Case Management Note  Patient Details  Name: Diana Gardner MRN: [id number] Date of Birth: Oct 06, 1950  Subjective/Objective:    Pt admitted with pleural effusions                Action/Plan:  Patient from home with base line COPD and is 2-3 liter O2 dependent at home on combivent and albuterol.  Pt is now ventilated - no family at bedside    Expected Discharge Date:                  Expected Discharge Plan:     In-House Referral:     Discharge planning Services  CM Consult  Post Acute Care Choice:    Choice offered to:     DME Arranged:    DME Agency:     HH Arranged:    HH Agency:     Status of Service:  In process, will continue to follow  If discussed at Long Length of Stay Meetings, dates discussed:    Additional Comments: 12/01/2016  CM requested bedside nurse to contact PT and request a discharge home assessment.  11/30/16 Pt is independent from home and wants to return home with her adult son per CSW.  CSW consulted daughter and she will "discuss it with her mom".  CM left PT sticky note requesting assessment for home discharge  11/24/16 Pt is s/p VATS - CT removed, IV antibiotics.   SNF recommended - CSW consulted  11/15/16 Pt went for VATS procedure today Maryclare Labrador, RN 12/01/2016, 11:18 AM

## 2016-12-01 NOTE — Progress Notes (Signed)
      [address]       [place] [address]             [phone]      Up in chair eating lunch. Appetite fair.   Cough persists. Walked to bathroom today but not in hallway  BP (!) 155/59 (BP Location: Left Arm)   Pulse 69   Temp 98.5 F (36.9 C)   Resp (!) 25   Ht 5\' 4"  (1.626 m)   Wt 108 lb 14.4 oz (49.4 kg)   SpO2 97%   BMI 18.69 kg/m    Intake/Output Summary (Last 24 hours) at 12/01/16 1400 Last data filed at 12/01/16 1128  Gross per 24 hour  Intake             1350 ml  Output             1100 ml  Net              250 ml   Wound c,d,i Lungs diminished left base, absent right base  Remains on vanco and maxipime   C. Roxan Hockey, MD [facility] [phone]

## 2016-12-01 NOTE — Clinical Social Work Placement (Signed)
   CLINICAL SOCIAL WORK PLACEMENT  NOTE  Date:  12/01/2016  Patient Details  Name: Diana Gardner MRN: [id number] Date of Birth: 1951-04-22  Clinical Social Work is seeking post-discharge placement for this patient at the [place] level of care (*CSW will initial, date and re-position this form in  chart as items are completed):  Yes   Patient/family provided with [facility] Work Department's list of facilities offering this level of care within the geographic area requested by the patient (or if unable, by the patient's family).  Yes   Patient/family informed of their freedom to choose among providers that offer the needed level of care, that participate in Medicare, Medicaid or managed care program needed by the patient, have an available bed and are willing to accept the patient.  Yes   Patient/family informed of [facility]'s ownership interest in [facility] and [facility], as well as of the fact that they are under no obligation to receive care at these facilities.  PASRR submitted to EDS on       PASRR number received on 11/30/16     Existing PASRR number confirmed on       FL2 transmitted to all facilities in geographic area requested by pt/family on 12/01/16     FL2 transmitted to all facilities within larger geographic area on 12/01/16     Patient informed that his/her managed care company has contracts with or will negotiate with certain facilities, including the following:        Yes   Patient/family informed of bed offers received.  Patient chooses bed at    [facility]  Physician recommends and patient chooses bed at      Patient to be transferred to   on  .  Patient to be transferred to facility by       Patient family notified on   of transfer.  Name of family member notified:        PHYSICIAN Please prepare priority discharge summary, including medications     Additional Comment:     _______________________________________________ Normajean Baxter, LCSW 12/01/2016, 3:00 PM

## 2016-12-01 NOTE — Progress Notes (Signed)
Paged MD regarding pt requesting a provider to sign FMLA paperwork. Family member, daughter, states she needs it by tomorrow morning. Awaiting call back   

## 2016-12-01 NOTE — Progress Notes (Signed)
Paged MD regarding pt requesting oral pain medications. Pt has no PO PRN. Pt states percocet works best for her. Awaiting call back   

## 2016-12-01 NOTE — Progress Notes (Signed)
Patient ID: Diana Gardner, female   DOB: 08/11/1951, 66 y.o.   MRN: [id number]                                                                PROGRESS NOTE                                                                                                        Patient Demographics:    Diana Gardner, is a 66 y.o. female, DOB - Apr 30, 1951, [id number]  Admit date - 11/05/2016   Admitting Physician Edwin Dada, MD  Outpatient Primary MD for the patient is [name], MD  LOS - 26  Chief Complaint  Patient presents with  . Shortness of Breath      Brief Narrative    66 y.o.femalewith a PMH of COPD, chronic respiratory failure on home oxygen who was admitted 11/05/16 for a right lower lobe necrotizing community-acquired pneumonia complicated by pneumothorax and empyema status post right facets and right lower lobectomy on 11/15/16. She was intubated from 11/07/16 arrow forward 11/20/16 and under the care of the critical care team. She also required pressor support, which was weaned 11/09/16. Care transferred to [facility].   SIGNIFICANT EVENTS:  3/18 Admitted 3/20 transfer to ICU and intubated 3/21 refractory shock 3/22 Right PTX > chest tube placed. Pressors stopped 3/28 VATS, right mini thoracotomy, drainage of empyema, decortication, right lower lobectomy. 4/02 Extubated 4/03 Re-admitted to ICU for acute hypercarbic respiratory failure. Improved on BiPAP. 4/07 PCCM asked to see for desatn/mucus plug   Subjective:   Pt reports feeling better, had some rest last night and feels less short of breath.    Assessment  & Plan :   Acute on chronic hypoxemic hypercarbic respiratory failure Right lower lobe necrotizing pneumonia Right empyema status post VATS decortication and right lower lobe lobectomy  Right Pleural Effusion vs mucous plug  - CTS reviewed CXR and CT from 4/8, thought to be primarily atelectasis/ consolidation with a small amount of fluid, which would be expected  after a lobecomtomy - IR attempted thoracentesis but unable to perform on 4/9 as not enough fluid to aspirate - CXR 4/10 with notable for an improvement in the appearance of the right lung and with re expansion noted throughout - pt is also clinically better, ambulated yesterday and said she did well  - per PCCM, Continue chest PT > vest, flutter valve, mucomyst - Continue Duoneb Q4  - No role for FOB at this time  - continue vancomycin, cefepime day #7 (started on 11/25/2016) - appreciate PCCM and CTS teams input  - plan to change to Augmentin in am - Follow up with Pulmonary at discharge > see discharge section for details  Sepsis/septic shock secondary to necrotizing right lower lung pneumonia/empyema (admission 3/18) - Vanco/Rocephin/zithromax 3/18=>3/21 - Vanco 3/28=>3/29 -  Unasyn 3/21=>4/7 - Chest tube 3/22 secondary to right pneumothorax - R VATS, R minithoracotomy with drainage of empyema, decortication, right lower lobectomy 3/28 - Intubated 3/28, Extubated 4/2 - Sputum 3/18 negative - Blood cx 3/18 negative - Respiratory virus panel negative - Urine strep antigen positive - 2-4 week course of unasyn recommended by critical care team but due to Acute respiratory failure 4/7, started Vanco /cefepime 4/7=> - continue same regimen per PCCM for now with plan to change to PO Augmentin in am  Copd w exacerbation - Currently on prednisone taper, Duoneb, Pulmicort, Nacetylcysteine - overall stable  - transfer to tele  Acute renal failure secondary to nsaid and ace inhibitor - resolved   Hyponatremia  - overall stable - will monitor   Normocytic anemia - Hg is down a bit in the past 24 hours but no signs of active bleeding - CBC in AM  Hyperlipidemia - Cont lipitor  Hypertension - Cont metoprolol - reasonable inpatient control   Steroid induced hyperglycemia - Cont ssi  Thrombocytosis  - From infection - improving, Plt WNL this AM  - CBC in AM  Code Status :  Full code  Family Communication  : w patient, no family at bedside  Disposition Plan  :  Home eventually  Barriers For Discharge : pending an improvement in respiratory status   Consults  :  pccm, CT surgery  Procedures  :  Chest tube 3/22 secondary to right pneumothorax  2-D echo 11/08/16 estimated ejection fraction was in the range of 65% to 70%. grade 1 DD  11/15/16 PROCEDURE:  BRONCHOSCOPY RIGHT VATS/ MINI THORRACOTOMY DRAINAGE OF EMPYEMA AND DECORTICATION RIGHT LOWER LOBECTOMY  DVT Prophylaxis     Lovenox -  SCDs   Cultures 3/20 Tracheal aspirate >> NGTD x 2 days 3/21 Respiratory viral panel negative 3/21 Blood cultures x 2 >> NGTD x 5 days 3/28 R pleural fluid NGTD x 5 days  Antibiotics  Vanco/Rocephin/zithromax 3/18 => 3/21 Vanco 3/28 => 3/29 Unasyn 3/21 =>4 /7 Vancomycin 4/7 => Cefepime 4/7 =>  Anti-infectives    Start     Dose/Rate Route Frequency Ordered Stop   11/26/16 0600  vancomycin (VANCOCIN) IVPB 750 mg/150 ml premix     750 mg 150 mL/hr over 60 Minutes Intravenous Every 12 hours 11/25/16 1810     11/25/16 1830  vancomycin (VANCOCIN) IVPB 1000 mg/200 mL premix     1,000 mg 200 mL/hr over 60 Minutes Intravenous  Once 11/25/16 1810 11/25/16 2100   11/25/16 1830  ceFEPIme (MAXIPIME) 1 g in dextrose 5 % 50 mL IVPB     1 g 100 mL/hr over 30 Minutes Intravenous Every 8 hours 11/25/16 1810     11/16/16 0000  vancomycin (VANCOCIN) IVPB 1000 mg/200 mL premix     1,000 mg 200 mL/hr over 60 Minutes Intravenous Every 12 hours 11/15/16 1711 11/16/16 0100   11/15/16 0715  vancomycin (VANCOCIN) IVPB 1000 mg/200 mL premix     1,000 mg 200 mL/hr over 60 Minutes Intravenous On call to O.R. 11/15/16 0706 11/15/16 1315   11/10/16 0142  Ampicillin-Sulbactam (UNASYN) 3 g in sodium chloride 0.9 % 100 mL IVPB  Status:  Discontinued     3 g 200 mL/hr over 30 Minutes Intravenous Every 8 hours 11/09/16 1809 11/25/16 1758   11/08/16 1700  Ampicillin-Sulbactam (UNASYN) 3  g in sodium chloride 0.9 % 100 mL IVPB  Status:  Discontinued     3 g 200 mL/hr over 30 Minutes Intravenous  Every 12 hours 11/08/16 1632 11/09/16 1809   11/07/16 2100  vancomycin (VANCOCIN) IVPB 1000 mg/200 mL premix  Status:  Discontinued     1,000 mg 200 mL/hr over 60 Minutes Intravenous Every 48 hours 11/05/16 2144 11/06/16 0347   11/06/16 2100  cefTRIAXone (ROCEPHIN) 1 g in dextrose 5 % 50 mL IVPB  Status:  Discontinued     1 g 100 mL/hr over 30 Minutes Intravenous Every 24 hours 11/05/16 2326 11/08/16 1609   11/06/16 2100  azithromycin (ZITHROMAX) tablet 500 mg  Status:  Discontinued     500 mg Oral Every 24 hours 11/05/16 2326 11/07/16 1452   11/05/16 2200  azithromycin (ZITHROMAX) 500 mg in dextrose 5 % 250 mL IVPB     500 mg 250 mL/hr over 60 Minutes Intravenous  Once 11/05/16 2154 11/05/16 2315   11/05/16 1945  vancomycin (VANCOCIN) IVPB 1000 mg/200 mL premix     1,000 mg 200 mL/hr over 60 Minutes Intravenous  Once 11/05/16 1944 11/05/16 2209   11/05/16 1930  cefTRIAXone (ROCEPHIN) 1 g in dextrose 5 % 50 mL IVPB     1 g 100 mL/hr over 30 Minutes Intravenous  Once 11/05/16 1926 11/05/16 2023   11/05/16 1930  azithromycin (ZITHROMAX) 500 mg in dextrose 5 % 250 mL IVPB  Status:  Discontinued     500 mg 250 mL/hr over 60 Minutes Intravenous  Once 11/05/16 1926 11/05/16 2014        Objective:   Vitals:   12/01/16 0430 12/01/16 0750 12/01/16 0950 12/01/16 1000  BP: (!) 159/65 (!) 143/59    Pulse: (!) 57 65  (!) 58  Resp: 20 17    Temp: 98.2 F (36.8 C) 98.4 F (36.9 C)    TempSrc: Oral Oral    SpO2: 100% 98% 100%   Weight: 49.4 kg (108 lb 14.4 oz)     Height:        Wt Readings from Last 3 Encounters:  12/01/16 49.4 kg (108 lb 14.4 oz)  01/31/16 68.5 kg (151 lb)  11/23/15 69.4 kg (153 lb)     Intake/Output Summary (Last 24 hours) at 12/01/16 1153 Last data filed at 12/01/16 1128  Gross per 24 hour  Intake              990 ml  Output             1100 ml  Net              -110 ml    Physical Exam Awake Alert, Oriented X 3, No new F.N deficits, Normal affect [place].AT,PERRAL Supple Neck,No JVD, No cervical lymphadenopathy appriciated.  Symmetrical Chest wall movement, slightly diminished breath sounds on the right side but no wheezing, overall much better RRR,No Gallops,Rubs or new Murmurs, No Parasternal Heave +ve B.Sounds, Abd Soft, No tenderness, No organomegaly appriciated, No rebound - guarding or rigidity. No Cyanosis, Clubbing or edema, No new Rash or bruise     Data Review:   CBC  Recent Labs Lab 11/26/16 0155 11/28/16 0219 11/29/16 0218 11/30/16 0212 12/01/16 0211  WBC 7.2 8.1 7.8 8.6 9.3  HGB 9.3* 8.7* 8.5* 8.3* 8.9*  HCT 30.1* 27.8* 26.2* 26.8* 29.3*  PLT 565* 430* 397 426* 394  MCV 89.3 89.7 88.5 88.4 89.3  MCH 27.6 28.1 28.7 27.4 27.1  MCHC 30.9 31.3 32.4 31.0 30.4  RDW 17.0* 17.4* 17.6* 17.0* 16.7*    Chemistries   Recent Labs Lab 11/26/16 0155 11/28/16 0219 11/29/16  9811 11/30/16 0212 12/01/16 0211  NA 137 135 134* 134* 133*  K 3.9 4.3 3.9 3.9 4.1  CL 99* 98* 98* 99* 98*  CO2 32 31 29 30 30   GLUCOSE 117* 128* 153* 126* 118*  BUN 13 11 12 13 14   CREATININE 0.51 0.51 0.61 0.55 0.54  CALCIUM 8.2* 8.3* 8.2* 7.9* 8.3*  AST 18  --   --   --   --   ALT 28  --   --   --   --   ALKPHOS 84  --   --   --   --   BILITOT 0.3  --   --   --   --    Inpatient Medications  Scheduled Meds: . acetylcysteine  4 mL Nebulization TID  . atorvastatin  20 mg Oral Daily  . budesonide (PULMICORT) nebulizer solution  0.5 mg Nebulization BID  . ceFEPime (MAXIPIME) IV  1 g Intravenous Q8H  . chlorhexidine  15 mL Mouth Rinse BID  . enoxaparin (LOVENOX) injection  40 mg Subcutaneous Q24H  . feeding supplement (ENSURE ENLIVE)  237 mL Oral BID BM  . insulin aspart  0-9 Units Subcutaneous TID WC  . ipratropium-albuterol  3 mL Nebulization TID  . mouth rinse  15 mL Mouth Rinse q12n4p  . metoprolol tartrate  25 mg Oral BID  .  predniSONE  5 mg Oral Q breakfast  . saccharomyces boulardii  250 mg Oral BID  . vancomycin  750 mg Intravenous Q12H   Continuous Infusions: PRN Meds:.albuterol, fentaNYL (SUBLIMAZE) injection, Gerhardt's butt cream, labetalol, ondansetron (ZOFRAN) IV  Micro Results Recent Results (from the past 240 hour(s))  C difficile quick scan w PCR reflex     Status: None   Collection Time: 11/25/16 12:46 PM  Result Value Ref Range Status   C Diff antigen NEGATIVE NEGATIVE Final   C Diff toxin NEGATIVE NEGATIVE Final   C Diff interpretation No C. difficile detected.  Final   Radiology Reports Dg Chest 2 View  Result Date: 11/30/2016 CLINICAL DATA:  Empyema. Hx of COPD, empyema, hypertension, and spontaneous pneumothorax. Pt had a video assisted thoracoscopy and mini thoracotomy with drainage of empyema in the right lower lobe of right lung in march 2018. EXAM: CHEST - 2 VIEW COMPARISON:  11/28/2016 FINDINGS: Small right pleural effusion as before. There is adjacent subsegmental atelectasis or consolidation at the right lung base. Heart size and mediastinal contours are within normal limits. No pneumothorax. Osteopenia. IMPRESSION: Persistent small right pleural effusion and adjacent right lower lobe consolidation/atelectasis. Electronically Signed   By: Lucrezia Europe M.D.   On: 11/30/2016 07:59   Dg Chest 2 View  Result Date: 11/28/2016 CLINICAL DATA:  Persistent cough and chest congestion. Admitted 3 weeks ago with right-sided pneumonia and atelectasis. Persistent cough and chest congestion EXAM: CHEST  2 VIEW COMPARISON:  Chest x-ray and chest CT scan of April 8th 2018 FINDINGS: There has been marked improvement in the appearance of the right lung with re-expansion noted throughout. A small right pleural effusion persists. There is a trace of pleural fluid on the left. The left lung is well-expanded and clear. The cardiac silhouette is top-normal in size. The pulmonary vascularity is normal. There is  calcification in the wall of the aortic arch. The trachea is midline. IMPRESSION: Marked improvement in the appearance of the right lung. Right lower lobe infiltrate and small pleural effusion persist. A trace left pleural effusion is present. Thoracic aortic atherosclerosis. Electronically Signed  By: David  Martinique M.D.   On: 11/28/2016 09:30   Dg Chest 2 View  Result Date: 11/24/2016 CLINICAL DATA:  Recent right lower lobectomy for empyema. Currently with cough EXAM: CHEST  2 VIEW COMPARISON:  November 23, 2016 FINDINGS: There has been partial clearing of airspace consolidation from the lung bases compared to 1 day prior. There remains patchy bibasilar atelectasis with small pleural effusions bilaterally. Lungs elsewhere are clear. Note that there is volume loss on the right consistent with previous surgery. Heart size is normal. The pulmonary vascular is within normal limits. No adenopathy. No bone lesions. IMPRESSION: Bibasilar atelectasis with small pleural effusions. No consolidation. Stable volume loss on the right. No new opacity. Stable cardiac silhouette. No evident pneumothorax. Electronically Signed   By: Lowella Grip III M.D.   On: 11/24/2016 08:38   Dg Chest 2 View  Result Date: 11/05/2016 CLINICAL DATA:  Shortness of breath and cough. EXAM: CHEST  2 VIEW COMPARISON:  August 28, 2015 FINDINGS: The cardiomediastinal silhouette is stable. No pneumothorax. The left lung is clear. New effusion and underlying opacity seen on the right. IMPRESSION: New effusion and underlying opacity seen in the right lower lobe. No other interval changes. Recommend follow-up to resolution. Electronically Signed   By: Dorise Bullion III M.D   On: 11/05/2016 19:03   Ct Chest Wo Contrast  Result Date: 11/26/2016 CLINICAL DATA:  Right lower lobe surgery for necrotizing pneumonia 11/15/2016. Became short of breath last night with hypoxia. EXAM: CT CHEST WITHOUT CONTRAST TECHNIQUE: Multidetector CT imaging of the  chest was performed following the standard protocol without IV contrast. COMPARISON:  CT 11/13/2016, 02/19/2015 and 10/13/2005 as well as recent chest x-ray 11/26/2016. FINDINGS: Cardiovascular: Interval removal of central venous catheter. Mild cardiomegaly with minimal cardiomediastinal shift to the right compatible recent right lower lobectomy. Minimal calcified plaque over the left main and left anterior descending coronary arteries. Mild calcified plaque over the thoracic aorta. Mediastinum/Nodes: 1 cm precarinal lymph node without significant change likely reactive. No hilar adenopathy. Remaining mediastinal structures are unremarkable. Lungs/Pleura: Interval removal of endotracheal tube. Subtle patchy density over the left apex improved. Mild left basilar atelectasis. Surgical sutures over the posteromedial right lower thorax compatible with right lower lobectomy. Moderate-size right pleural effusion. Heterogeneous consolidation over the right mid to upper lung. No pneumothorax. Central right-sided bronchi not well aerated. Upper Abdomen: Unchanged. Musculoskeletal: Mild degenerative change of the spine. IMPRESSION: Postsurgical change compatible recent right lower lobectomy with volume loss of the right lung and cardiomediastinal shift to the right. Moderate-size right effusion tracking to the apex. Heterogeneous consolidation over the right mid to upper lung with decreased aeration of the central bronchi which may be due to persistent infection. Patchy opacification over the left apex improved. Left basilar atelectasis. Mild cardiomegaly. Mild atherosclerotic coronary artery disease. Aortic atherosclerosis. Electronically Signed   By: Marin Olp M.D.   On: 11/26/2016 17:04   Ct Chest Wo Contrast  Result Date: 11/13/2016 CLINICAL DATA:  66 year old female with history of loculated pleural effusion with chest tube in place. EXAM: CT CHEST WITHOUT CONTRAST TECHNIQUE: Multidetector CT imaging of the chest  was performed following the standard protocol without IV contrast. COMPARISON:  Chest CT 02/19/2015. Multiple recent prior chest radiographs. FINDINGS: Cardiovascular: Heart size is normal. There is no significant pericardial fluid, thickening or pericardial calcification. There is aortic atherosclerosis, as well as atherosclerosis of the great vessels of the mediastinum and the coronary arteries, including calcified atherosclerotic plaque in the left main  and left anterior descending coronary arteries. Left internal jugular central venous catheter with tip terminating in the superior aspect of the right atrium. Mediastinum/Nodes: Patient is intubated, with the tip of the endotracheal to the lying approximately 2.1 cm above the carina. Nasogastric tube extends into the stomach. Numerous borderline enlarged and mildly enlarged mediastinal and bilateral hilar lymph nodes, measuring up to 11 mm in short axis in the subcarinal nodal station. Esophagus is unremarkable in appearance. No axillary lymphadenopathy. Lungs/Pleura: Right-sided chest tube in position with tip directed into the medial aspect of the apex of the right hemithorax. Large complex right-sided pleural fluid and gas collection. Fluid ranges from low to intermediate attenuation, suggesting proteinaceous contents. Some of the pneumothorax component is confluent anteriorly, while other locules of gas are noted throughout the lower right hemithorax, some of which appear to be within collapsed/consolidated lung tissue, but other small locules of gas likely are present within loculated pleural fluid in the lower right hemithorax. In the medial aspect of the lower right hemithorax there is what appears to be a thick-walled cavitary area, which is favored to be within the lung parenchyma rather than in the pleural space, measuring up to 6.4 x 2.4 x 6.5 cm (axial image 83 of series 201, and coronal image 56 of series 203). Areas of bronchiectasis are noted within  the right lower lobe. Within the aerated portions of the lungs there is patchy multifocal ground-glass attenuation and septal thickening. The ground-glass attenuation is most evident in a peribronchovascular distribution, likely infectious/inflammatory in etiology, most severe in the left upper lobe. No significant left pleural effusion. Upper Abdomen: Unremarkable. Musculoskeletal: Extensive emphysema is noted throughout the subcutaneous fat of the chest wall bilaterally extending up into the cervical regions bilaterally (right greater than left). There are no aggressive appearing lytic or blastic lesions noted in the visualized portions of the skeleton. IMPRESSION: 1. Findings are most compatible with right lower lobe necrotizing pneumonia, with what appears to be a large right lower lobe cavity in the medial aspect of the basal segments of the right lower lobe, as well as a large right empyema. In this complex right pleural gas and fluid collections there both a small anterior pneumothorax, as well as multifocal loculated components of gas within the inferior aspect of the right pleural space, as above. Right-sided chest tube is directed into the medial aspect of the right apex, and the tip of the chest tube is currently surrounded by very little pleural fluid or gas. 2. Multifocal patchy interstitial and airspace disease asymmetrically distributed throughout the remaining aerated portions of the lungs, favored to reflect severe multilobar bronchopneumonia. 3. Aortic atherosclerosis, in addition to left main and left anterior descending coronary artery disease. Please note that although the presence of coronary artery calcium documents the presence of coronary artery disease, the severity of this disease and any potential stenosis cannot be assessed on this non-gated CT examination. Assessment for potential risk factor modification, dietary therapy or pharmacologic therapy may be warranted, if clinically  indicated. 4. Support apparatus, as above. Electronically Signed   By: Vinnie Langton M.D.   On: 11/13/2016 15:19   US Renal  Result Date: 11/06/2016 CLINICAL DATA:  Sepsis EXAM: RENAL / URINARY TRACT ULTRASOUND COMPLETE COMPARISON:  None. FINDINGS: Right Kidney: Length: 9.4 cm, within normal limits. Echogenicity within normal limits. No mass or hydronephrosis visualized. Left Kidney: Length: 10.9 cm, within normal limits. Echogenicity within normal limits. No mass or hydronephrosis visualized. Bladder: Appears normal for degree of  bladder distention. IMPRESSION: Negative bilateral renal ultrasound. Electronically Signed   By: San Morelle M.D.   On: 11/06/2016 12:41   Dg Chest Port 1 View  Result Date: 11/26/2016 CLINICAL DATA:  Respiratory failure. Hx of COPD, partial right lung removed(11/20/16), hypertension, primary spontaneous pneumothorax. Nonsmoker, 2nd hand smoke exposure. EXAM: PORTABLE CHEST 1 VIEW COMPARISON:  Chest x-rays dated 11/25/2016 and 11/24/2016. FINDINGS: There is now complete opacification the right hemithorax, presumably large pleural effusion and/or airspace collapse, favor large pleural effusion given the stable position of the mediastinum. There is stable volume loss on the right that is compatible with history of previous right lower lobe resection. Left lung is clear. Osseous structures about the chest are unremarkable. IMPRESSION: Complete opacification of the right hemithorax, significantly worsened compared to yesterday's exam, favor large pleural effusion. These results will be called to the ordering clinician or representative by the Radiologist Assistant, and communication documented in the PACS or zVision Dashboard. Electronically Signed   By: Franki Cabot M.D.   On: 11/26/2016 07:33   Dg Chest Port 1 View  Result Date: 11/25/2016 CLINICAL DATA:  Concern for spontaneous pneumothorax. Personal history of spontaneous pneumothorax. Shortness of breath, acute onset.  Initial encounter. EXAM: PORTABLE CHEST 1 VIEW COMPARISON:  Chest radiograph performed earlier today at 6:43 a.m. FINDINGS: A small right pleural effusion is suspected, with associated airspace consolidation. This raises concern for pneumonia, new from the prior study. Minimal left basilar opacity likely reflects atelectasis or scarring, stable from the prior study. No pneumothorax is seen. The patient is status post right lower lobectomy. Underlying chronic right pleural thickening is noted. The cardiomediastinal silhouette is mildly enlarged. No acute osseous abnormalities are identified. IMPRESSION: 1. Small right pleural effusion suspected, with associated airspace consolidation. This raises concern for pneumonia, new from the prior study. Minimal left basilar airspace opacity likely reflects atelectasis or scarring. 2. No evidence of pneumothorax. 3. Mild cardiomegaly. Electronically Signed   By: Garald Balding M.D.   On: 11/25/2016 17:46   Dg Chest Port 1 View  Result Date: 11/25/2016 CLINICAL DATA:  Respiratory failure EXAM: PORTABLE CHEST 1 VIEW COMPARISON:  Yesterday FINDINGS: Streaky densities at the bases, right more than left. Asymmetric elevation of the right diaphragm, increased. There is right pleural thickening reaching the apex. Borderline cardiomegaly, accentuated by technique. Small left effusions based on lateral film yesterday. No Kerley lines or pneumothorax. IMPRESSION: 1. Mildly increased atelectasis at the right base. 2. Right pleural thickening and lower lobectomy. Electronically Signed   By: Monte Fantasia M.D.   On: 11/25/2016 08:32   Dg Chest Port 1 View  Result Date: 11/23/2016 CLINICAL DATA:  CHEST TUBE REMOVAL/RIGHT EXAM: PORTABLE CHEST 1 VIEW COMPARISON:  11/23/2016 FINDINGS: Right chest tube has been removed. No pneumothorax. Heart size is accentuated by the AP portable technique. There are patchy densities in the lung bases bilaterally which partially obscure the  hemidiaphragms, increased on the left. Opacity at the left lung base now obscures the hemidiaphragm. No pulmonary edema. IMPRESSION: Bilateral lower lobe opacities, increased on the left. No pneumothorax following removal of chest tube. Electronically Signed   By: Nolon Nations M.D.   On: 11/23/2016 12:53   Dg Chest Port 1 View  Result Date: 11/23/2016 CLINICAL DATA:  Shortness of breath.  Pleural effusion . EXAM: PORTABLE CHEST 1 VIEW COMPARISON:  11/22/2016.  11/21/2016 . FINDINGS: Right chest tube in stable position. No pneumothorax. Stable cardiomegaly. Interim improvement of pulmonary interstitial prominence suggesting improvement of pulmonary interstitial  edema. Tiny bilateral pleural effusions. Stable biapical pleural thickening most likely secondary scarring . Prominent skin folds noted over the chest. IMPRESSION: 1. Right chest tube in stable position.  No pneumothorax. 2. Stable cardiomegaly. Interim improvement of pulmonary interstitial prominence suggesting improving interstitial edema. Tiny bilateral pleural effusions again noted. Electronically Signed   By: Marcello Moores  Register   On: 11/23/2016 07:34   Dg Chest Port 1 View  Result Date: 11/22/2016 CLINICAL DATA:  66 y/o  F; increasing shortness of breath. EXAM: PORTABLE CHEST 1 VIEW COMPARISON:  11/21/2016 chest radiograph. FINDINGS: Stable cardiac silhouette given projection and technique. Aortic atherosclerosis with calcification. Stable position of right-sided chest tube. Increase interstitial opacities in the lungs bilaterally probably represents developing interstitial pulmonary edema. Increasing opacity of the right lung base probably represents worsening pleural effusion and atelectasis. No acute osseous abnormality identified. IMPRESSION: Increase interstitial opacities probably representing developing edema. Increasing opacity of the right lung and right lung base, likely increasing pleural effusion. No appreciable pneumothorax.  Electronically Signed   By: Kristine Garbe M.D.   On: 11/22/2016 05:29   Dg Chest Port 1 View  Result Date: 11/21/2016 CLINICAL DATA:  History of pneumothorax.  Chest tube removal. EXAM: PORTABLE CHEST 1 VIEW COMPARISON:  11/20/2016. FINDINGS: Interim extubation. Interim removal of the lateral most chest tube. Two medial most chest tube is in stable position. Left IJ line in stable position . Heart size stable. Right base subsegmental atelectasis. Perihilar atelectasis/infiltrate. Small bilateral pleural effusions. No pneumothorax. IMPRESSION: 1. Interim extubation. Interim removal of lateral most chest tube. Two medial most chest tubes in stable position. Left IJ line stable position. No pneumothorax. 2. Persistent right base subsegmental atelectasis. Mild left perihilar atelectasis/ infiltrate noted on today's exam. Small bilateral pleural effusions. Electronically Signed   By: Marcello Moores  Register   On: 11/21/2016 06:45   Dg Chest Port 1 View  Result Date: 11/20/2016 CLINICAL DATA:  Status post thoracoscopy and empyema drainage with decortication on the right. EXAM: PORTABLE CHEST 1 VIEW COMPARISON:  Portable chest x-ray of November 19, 2016 FINDINGS: There remains mild volume loss on the right. There is no pneumothorax. The right-sided chest tube tip projects over the posterior aspect of the fourth rib. A second more medially positioned chest tube has its tip approximately 2 cm lateral to the hands. A third chest tube has its tip projecting over the medial aspect of the ninth rib. The left lung is well-expanded. Minimal basilar atelectasis is suspected on the left. The cardiac silhouette is top-normal in size. The pulmonary vascularity is not engorged. The endotracheal tube tip lies approximately 3.7 cm above the carina. The esophagogastric tube tip projects below the inferior margin of the image. The left internal jugular venous catheter tip projects over the distal third of the SVC. IMPRESSION: Fairly  stable appearance of the chest. No right-sided pneumothorax or significant pleural effusion. Minimal postsurgical volume loss due to lower lobectomy. Minimal left basilar atelectasis. The support tubes are in reasonable position. Electronically Signed   By: David  Martinique M.D.   On: 11/20/2016 07:08   Dg Chest Port 1 View  Result Date: 11/19/2016 CLINICAL DATA:  Pneumonia EXAM: PORTABLE CHEST 1 VIEW COMPARISON:  11/18/2016 FINDINGS: Three chest tubes on the right unchanged. No pneumothorax. Minimal right effusion unchanged. Mild bibasilar airspace disease unchanged Endotracheal tube 2.5 cm above the carina. Left jugular central venous catheter tip in the right atrium. NG tube enters the stomach. IMPRESSION: No change from yesterday. Electronically Signed   By:  Franchot Gallo M.D.   On: 11/19/2016 07:07   Dg Chest Port 1 View  Result Date: 11/18/2016 CLINICAL DATA:  Evaluate ETT. EXAM: PORTABLE CHEST 1 VIEW COMPARISON:  November 17, 2016 FINDINGS: The ETT is in good position. Right-sided chest tubes remain, in good position. The enteric tube terminates below today's study but the side port is below the GE junction within the stomach. No pneumothorax. The cardiomediastinal silhouette is stable. Small bilateral pleural effusions with underlying atelectasis, unchanged. A left-sided central line terminates near the caval atrial junction. It is possible it could extend into the right atrium. This appears to be unchanged in the interval. No overt edema. IMPRESSION: 1. Stable support apparatus. The distal tip of the right central line is near the caval atrial junction, either within the distal SVC or just within the right atrium. 2. Small bilateral effusions with underlying atelectasis remain. Electronically Signed   By: Dorise Bullion III M.D   On: 11/18/2016 07:26   Dg Chest Port 1 View  Result Date: 11/17/2016 CLINICAL DATA:  Chest 2.  Change in respirations and breath sounds EXAM: PORTABLE CHEST 1 VIEW  COMPARISON:  11/17/2016 FINDINGS: Right chest tube, endotracheal tube, NG tube and left central line remain in place, unchanged. Bibasilar atelectasis is stable. No pneumothorax. Probable small effusions. IMPRESSION: No pneumothorax. Continued bibasilar atelectasis and small effusions. Electronically Signed   By: Rolm Baptise M.D.   On: 11/17/2016 12:57   Dg Chest Port 1 View  Result Date: 11/17/2016 CLINICAL DATA:  Respiratory failure EXAM: PORTABLE CHEST 1 VIEW COMPARISON:  11/16/2016 FINDINGS: Endotracheal tube, nasogastric catheter and left jugular central line are again seen and stable. Chest tubes are noted on the right and stable. No pneumothorax is seen. Bibasilar atelectatic changes are seen. No bony abnormality is noted. IMPRESSION: Tubes and lines as described.  No pneumothorax is noted. Stable bibasilar changes. Electronically Signed   By: Inez Catalina M.D.   On: 11/17/2016 07:32   Dg Chest Port 1 View  Result Date: 11/16/2016 CLINICAL DATA:  Acute respiratory failure EXAM: PORTABLE CHEST 1 VIEW COMPARISON:  11/15/2016 FINDINGS: Cardiac shadow remains mildly enlarged. Endotracheal tube, nasogastric catheter and left jugular central line are again seen. Multiple right chest tubes are again noted and stable. No definitive pneumothorax is seen. Bibasilar infiltrative changes are noted right greater than left. The changes on the left has increased slightly in the interval from the prior exam. IMPRESSION: Postsurgical changes. No pneumothorax is seen. Bibasilar infiltrative changes are seen. Electronically Signed   By: Inez Catalina M.D.   On: 11/16/2016 07:34   Dg Chest Port 1 View  Result Date: 11/15/2016 CLINICAL DATA:  Right lower lobe lobectomy for pulmonary abscess/empyema EXAM: PORTABLE CHEST 1 VIEW COMPARISON:  11/15/2016 FINDINGS: The heart size and mediastinal contours are within normal limits. Endotracheal tube tip is slightly low lying at 2.4 cm above the carina. Pullback approximately  1 cm recommended. Left IJ central line catheter is noted at the cavoatrial junction. Two right-sided chest tubes are identified projecting over the lung apex with decrease in previously noted loculated pleural opacity along the periphery of the right hemithorax. Bibasilar atelectasis is identified. No significant pneumothorax. Gastric tube extends below the left hemidiaphragm into the expected location the stomach. The tip is excluded however. No acute osseous abnormality appear IMPRESSION: 1. Slightly low-lying endotracheal tube tip approximately 2.4 cm above the carina. Pullback approximately 1 cm recommended. Otherwise, satisfactory support line and tube positions. These results will be called  to the ordering clinician or representative by the Radiologist Assistant, and communication documented in the PACS or zVision Dashboard. 2. Residual atelectasis at the right lung base without appreciable pneumothorax status post right lower lobectomy. Electronically Signed   By: Ashley Royalty M.D.   On: 11/15/2016 19:57   Dg Chest Port 1 View  Result Date: 11/15/2016 CLINICAL DATA:  Respiratory failure. EXAM: PORTABLE CHEST 1 VIEW COMPARISON:  11/14/2016 CT 11/13/2016 . FINDINGS: Endotracheal tube, NG tube, left IJ line, right chest tube in stable position. No definite pneumothorax identified. Previously identified anterior pneumothorax best identified on CT of 11/13/2016. Persistent basilar infiltrates, particularly prominent on the right. Persistent prominent right sided pleural effusions/possible empyema again noted. No change. IMPRESSION: 1. Lines and tubes including right chest tube in stable position. No pneumothorax identified. Previously identified anterior pneumothorax best demonstrated by prior CT of 11/13/2016. 2. Persistent loculated right pleural effusion/empyema. No acute change. Persistent basilar infiltrates, particular prominent on the right. No change. Electronically Signed   By: Marcello Moores  Register   On:  11/15/2016 06:46   Dg Chest Port 1 View  Result Date: 11/14/2016 CLINICAL DATA:  Respiratory failure. EXAM: PORTABLE CHEST 1 VIEW COMPARISON:  CT 11/13/2016.  Chest x-ray 11/13/2016. FINDINGS: Endotracheal tube, NG tube, left IJ line, right chest tube stable position. Heart size stable. Bilateral pulmonary infiltrates with right side pleural effusion/ possible empyema again noted. Previously identified anterior pneumothorax is best demonstrated by CT of 11/13/2016. Mild chest wall subcutaneous emphysema again noted. IMPRESSION: 1. Lines and tubes including right chest tube in stable position. 2. Persistent bibasilar infiltrates. Persistent right sided prominent pleural effusion/ possible empyema again noted without significant change. Previously identified anterior pneumothorax best demonstrated about recent CT. No definite pneumothorax noted on this chest x-ray. Electronically Signed   By: Marcello Moores  Register   On: 11/14/2016 07:04   Dg Chest Port 1 View  Result Date: 11/13/2016 CLINICAL DATA:  Pneumothorax.  Shortness of breath . EXAM: PORTABLE CHEST 1 VIEW COMPARISON:  11/12/2016. FINDINGS: Endotracheal tube, left IJ line, NG tube, right chest tube in stable position. No pneumothorax. Persistent right side pleural effusion with increase in size from prior exam. Right base atelectasis. Heart size normal. Subcutaneous emphysema has improved. IMPRESSION: 1. Lines and tubes is including right chest tube in stable position. No pneumothorax. 2.  Interim increase in right pleural effusion. Electronically Signed   By: Marcello Moores  Register   On: 11/13/2016 06:47   Dg Chest Port 1 View  Result Date: 11/12/2016 CLINICAL DATA:  Patient with history of pneumothorax. EXAM: PORTABLE CHEST 1 VIEW COMPARISON:  Chest radiograph 11/11/2016 FINDINGS: ET tube terminates in the mid trachea. Central venous catheter tip projects over the right atrium. Monitoring leads overlie the patient. Right-sided chest tube projects over the  right lung apex. Stable cardiac and mediastinal contours. Persistent heterogeneous opacities right mid lower lung. Heterogeneous opacities left lung base. Moderate right pleural effusion. No definite pneumothorax. Subcutaneous emphysema. IMPRESSION: Stable support apparatus. Persistent moderate right pleural effusion with underlying opacities. No visible right-sided pneumothorax. Electronically Signed   By: Lovey Newcomer M.D.   On: 11/12/2016 08:40   Dg Chest Port 1 View  Result Date: 11/11/2016 CLINICAL DATA:  Continued surveillance pneumothorax. EXAM: PORTABLE CHEST 1 VIEW COMPARISON:  11/10/2016. FINDINGS: Unchanged cardiomediastinal silhouette and support apparatus. RIGHT chest tube remains in good position in the RIGHT lung apex. Visualized pneumothorax is less, although RIGHT pleural effusion is increased. BILATERAL pulmonary opacities appear worse. Subcutaneous emphysema redemonstrated. IMPRESSION: No visible  pneumothorax.  Increasing RIGHT effusion. Electronically Signed   By: Staci Righter M.D.   On: 11/11/2016 07:51   Dg Chest Port 1 View  Result Date: 11/10/2016 CLINICAL DATA:  Pneumothorax.  Chest tube. EXAM: PORTABLE CHEST 1 VIEW COMPARISON:  Chest x-ray 11/10/2016. FINDINGS: Endotracheal tube, left IJ line, NG tube, right chest tube in stable position. Heart size normal. Bilateral pulmonary infiltrates. Small right pneumothorax noted on today's exam. Diffuse chest wall subcutaneous emphysema again noted. IMPRESSION: 1. Lines and tubes including right chest tube in stable position. Small right pneumothorax noted on today's exam. Diffuse chest wall subcutaneous emphysema noted. 2. Diffuse bilateral pulmonary infiltrates. Critical Value/emergent results were called by telephone at the time of interpretation on 11/10/2016 at 3:17 pm to nurse Anderson Malta, who verbally acknowledged these results. Electronically Signed   By: Marcello Moores  Register   On: 11/10/2016 15:19   Dg Chest Port 1 View  Result Date:  11/10/2016 CLINICAL DATA:  Respiratory failure. EXAM: PORTABLE CHEST 1 VIEW COMPARISON:  11/09/2016. FINDINGS: Endotracheal tube, NG tube, left IJ line in stable position . Right chest tube in stable position. No pneumothorax. Diffuse bilateral chest wall and neck subcutaneous emphysema. Subcutaneous emphysema has increased significantly from prior exam. IMPRESSION: 1. Lines and tubes in stable position. Right chest tube in stable position. No pneumothorax. 2. Persistent bilateral pulmonary infiltrates, right greater than left. Small right pleural effusion. No interim change. 3. Interim significant progression of diffuse bilateral chest wall and bilateral neck subcutaneous emphysema. Electronically Signed   By: Marcello Moores  Register   On: 11/10/2016 07:13   Dg Chest Port 1 View  Result Date: 11/09/2016 CLINICAL DATA:  Post right chest tube placement EXAM: PORTABLE CHEST 1 VIEW COMPARISON:  11/09/2016 FINDINGS: Cardiomediastinal silhouette is stable. Persistent bilateral basilar infiltrates right greater than left. Small right pleural effusion. There is right chest tube in place with tip in right apex. No definite pneumothorax. Subcutaneous emphysema noted right axilla. Stable endotracheal and NG tube position. Stable left IJ central line position. IMPRESSION: Stable support apparatus. Persistent bilateral basilar infiltrates right greater than left. Small right pleural effusion. There is right chest tube in place with tip in right apex. No definite pneumothorax. Subcutaneous emphysema noted right axilla. Electronically Signed   By: Lahoma Crocker M.D.   On: 11/09/2016 10:27   Dg Chest Port 1 View  Result Date: 11/09/2016 CLINICAL DATA:  Respiratory failure EXAM: PORTABLE CHEST 1 VIEW COMPARISON:  11/08/2016 FINDINGS: Endotracheal tube, nasogastric catheter and left jugular central line are again seen and stable. Cardiac shadow is stable. Bibasilar infiltrates and effusions are seen slightly worse on the right than  the left. New left upper lobe infiltrative changes are noted. A new right-sided pneumothorax is noted with approximately 2 cm excursion at the apex at and 1 cm excursion laterally. IMPRESSION: New right pneumothorax. Bibasilar infiltrates as well as new left upper lobe infiltrate are seen. Effusions are seen right greater than left. Critical Value/emergent results were called by telephone at the time of interpretation on 11/09/2016 at 7:04 am to [facility], the pts nurse, who verbally acknowledged these results and will contact primary physician. Electronically Signed   By: Inez Catalina M.D.   On: 11/09/2016 07:05   Portable Chest Xray  Result Date: 11/08/2016 CLINICAL DATA:  Hypoxia EXAM: PORTABLE CHEST 1 VIEW COMPARISON:  November 07, 2016 FINDINGS: Endotracheal tube tip is 1.5 cm above the carina. Nasogastric tube tip and side port are below the diaphragm. Central catheter tip is at the cavoatrial  junction. No pneumothorax. There is airspace consolidation in both lower lobes, not significantly changed from 1 day prior. There are small pleural effusions bilaterally. Heart size and pulmonary vascularity are normal. There is atherosclerotic calcification in the aorta. No adenopathy. No bone lesions. IMPRESSION: Tube and catheter positions as described without evident pneumothorax. Airspace opacity suspicious for pneumonia in both lower lobes. Small pleural effusions bilaterally. No interstitial edema appreciable. Heart size normal. There is aortic atherosclerosis. Electronically Signed   By: Lowella Grip III M.D.   On: 11/08/2016 07:41   Dg Chest Port 1 View  Result Date: 11/07/2016 CLINICAL DATA:  Central line placement. EXAM: PORTABLE CHEST 1 VIEW COMPARISON:  Earlier today at 1412 hours FINDINGS: 1527 hours. Left internal jugular line is difficult to follow centrally secondary to overlying wires and leads. Terminates at least at the level of the high right atrium. Endotracheal and nasogastric tubes are  unchanged. Normal heart size. Atherosclerosis in the transverse aorta. Small left pleural effusion is unchanged. No pneumothorax. Bibasilar airspace disease is not significantly changed. Mild interstitial edema. IMPRESSION: Left internal jugular line is difficult to follow centrally secondary to overlying wires and leads. Followed to at least the level of the high right atrium. Consider retraction 2-3 cm with repeat imaging (ideally after removing EKG leads and wires). Otherwise, similar appearance of the chest with bibasilar airspace disease, left pleural fluid, and interstitial edema. Electronically Signed   By: Abigail Miyamoto M.D.   On: 11/07/2016 15:56   Dg Chest Port 1 View  Result Date: 11/07/2016 CLINICAL DATA:  Endotracheal tube EXAM: PORTABLE CHEST 1 VIEW COMPARISON:  03/09/2017 FINDINGS: Small bilateral pleural effusions. Right lower lobe airspace disease. No pneumothorax. Stable cardiomediastinal silhouette. Endotracheal to with the tip 3.5 cm above the carina. Nasogastric tube coursing below the diaphragm. IMPRESSION: 1. Small bilateral pleural effusions. Right lower lobe airspace disease concerning for pneumonia. 2. Small bilateral pleural effusions. Electronically Signed   By: Kathreen Devoid   On: 11/07/2016 14:29   Dg Chest Port 1 View  Result Date: 11/07/2016 CLINICAL DATA:  Pleural effusion EXAM: PORTABLE CHEST 1 VIEW COMPARISON:  Chest radiograph 11/05/2016 FINDINGS: Unchanged cardiomediastinal contours. Small right pleural effusion and associated basilar consolidation is unchanged. The left lung is clear. IMPRESSION: Unchanged small right pleural effusion and right basilar consolidation. Electronically Signed   By: Ulyses Jarred M.D.   On: 11/07/2016 02:43   Dg Abd Portable 1v  Result Date: 11/07/2016 CLINICAL DATA:  Enteric tube placement EXAM: PORTABLE ABDOMEN - 1 VIEW COMPARISON:  None. FINDINGS: Enteric tube terminates in the distal body of the stomach. No disproportionately dilated  small bowel loops. Mild colonic stool volume. No evidence of pneumatosis or pneumoperitoneum. Patchy bibasilar lung opacities. IMPRESSION: 1. Enteric tube terminates in the distal body of the stomach. 2. Nonobstructive bowel gas pattern. 3. Nonspecific patchy bibasilar lung opacities, correlate with chest radiograph. Electronically Signed   By: Ilona Sorrel M.D.   On: 11/07/2016 14:29   Ir US Chest  Result Date: 11/27/2016 CLINICAL DATA:  Patient with recent VATS and chest tube on the right side. Recent CT scan concerning for possible pleural effusion. Request is made for thoracentesis. EXAM: CHEST ULTRASOUND COMPARISON:  None. FINDINGS: Small area concerning for either lung consolidation or fluid with sedimentation present within the right inferior chest. IMPRESSION: Small area concerning for either lung consolidation or fluid with sedimentation within the right inferior chest cavity. This was confirmed with Dr. Barbie Banner. Thoracentesis was deferred. If the patient's kidney function  is normal and this needs to be further evaluated, he would recommend a CT scan of the chest with contrast as the most recent was without contrast. Read by: Saverio Danker, PA-C Electronically Signed   By: Marybelle Killings M.D.   On: 11/27/2016 13:29   Ir US Chest  Result Date: 11/06/2016 CLINICAL DATA:  Patient admitted with right-sided pneumonia. Recent imaging suggested possible right-sided pleural effusion. Request is made for thoracentesis. EXAM: CHEST ULTRASOUND COMPARISON:  None. FINDINGS: Minimal right-sided effusion. Most of what is seen is consolidation of the right lung secondary to pneumonia. IMPRESSION: Unable to perform thoracentesis secondary to minimal effusion. Read by: Saverio Danker, PA-C Electronically Signed   By: Aletta Edouard M.D.   On: 11/06/2016 11:57   Time Spent in minutes  30 min  Rebecca Eaton Magick- M.D on 12/01/2016 at 11:53 AM  Between 7am to 7pm - Pager - [phone]  After 7pm go to [email or web address] -  password [facility]  [facility] -  Office  [phone]

## 2016-12-01 NOTE — Social Work (Signed)
CSW spoke with daughter about SNF options and placement and daughter indicated that patient is more amenable to short term rehabilitation at this time.  She is aware that her son has limitations and will not be able to care fo her.  At this time, CSW will put offers for [facility] and [facility] per daughter. CSW will discuss with patient.

## 2016-12-01 NOTE — Progress Notes (Signed)
Pharmacy Antibiotic Note Diana Gardner is a 66 y.o. female admitted on 11/05/2016 with necrotizing pneumococcal PNA. Pharmacy dosing vancomycin and cefepime (started 4/7; currently day 7). Noted plans for augmentin on 4/14 (total abx duration of 2-4 weeks per notes)  Plan: Continue Vancomycin 731m IV q12h for now Continue Cefepime 1 gram IV every 8 hours Will follow patient progress   Height: _0  (162.6 cm) Weight: 108 lb 14.4 oz (49.4 kg) IBW/kg (Calculated) : 54.7  Temp (24hrs), Avg:98.2 F (36.8 C), Min:98 F (36.7 C), Max:98.4 F (36.9 C)   Recent Labs Lab 11/26/16 0155 11/28/16 0219 11/28/16 1804 11/29/16 0218 11/30/16 0212 12/01/16 0211  WBC 7.2 8.1  --  7.8 8.6 9.3  CREATININE 0.51 0.51  --  0.61 0.55 0.54  VANCOTROUGH  --   --  19  --   --   --     Estimated Creatinine Clearance: 53.2 mL/min (by C-G formula based on SCr of 0.54 mg/dL).    Allergies  Allergen Reactions  . No Known Allergies     Antimicrobials this admission:  Cefepime 4/7>>  Vancomycin 3/18 x1; 4/7>> Azithro 3/18 >> 3/19 CTX 3/18 >> 3/21 Unasyn 3/21 >>4/7  Microbiology results: 3/18 Strep urine + 3/18 mrsa pcr: neg 3/18 urine cx: mult species 3/20 resp cx: neg 3/21 RVP: neg 3/21 blood cx: neg 3/28 BAL: neg 3/28 lung abscess: neg 4/7 Cdiff: neg  Thank you for allowing pharmacy to be a part of this patient's care.  AHildred Laser Pharm D 12/01/2016 12:00 PM

## 2016-12-01 NOTE — Progress Notes (Signed)
MD returned page and placed verbal order for percocet 5 mg q 6 hours PRN for pain, pt states she does not want it at this time but will need it for bed   Leory Plowman

## 2016-12-02 LAB — BASIC METABOLIC PANEL
ANION GAP: 9 (ref 5–15)
BUN: 15 mg/dL (ref 6–20)
CHLORIDE: 97 mmol/L — AB (ref 101–111)
CO2: 27 mmol/L (ref 22–32)
Calcium: 8.2 mg/dL — ABNORMAL LOW (ref 8.9–10.3)
Creatinine, Ser: 0.55 mg/dL (ref 0.44–1.00)
GFR calc Af Amer: >60 mL/min (ref >60)
GFR calc non Af Amer: >60 mL/min (ref >60)
GLUCOSE: 124 mg/dL — AB (ref 65–99)
POTASSIUM: 4.7 mmol/L (ref 3.5–5.1)
Sodium: 133 mmol/L — ABNORMAL LOW (ref 135–145)

## 2016-12-02 LAB — GLUCOSE, CAPILLARY
GLUCOSE-CAPILLARY: 146 mg/dL — AB (ref 65–99)
Glucose-Capillary: 153 mg/dL — ABNORMAL HIGH (ref 65–99)
Glucose-Capillary: 162 mg/dL — ABNORMAL HIGH (ref 65–99)
Glucose-Capillary: 134 mg/dL — ABNORMAL HIGH (ref 65–99)

## 2016-12-02 LAB — CBC
HEMATOCRIT: 26.9 % — AB (ref 36.0–46.0)
Hemoglobin: 8.5 g/dL — ABNORMAL LOW (ref 12.0–15.0)
MCH: 28.1 pg (ref 26.0–34.0)
MCHC: 31.6 g/dL (ref 30.0–36.0)
MCV: 88.8 fL (ref 78.0–100.0)
Platelets: 410 10*3/uL — ABNORMAL HIGH (ref 150–400)
RBC: 3.03 MIL/uL — ABNORMAL LOW (ref 3.87–5.11)
RDW: 16.9 % — AB (ref 11.5–15.5)
WBC: 9.3 10*3/uL (ref 4.0–10.5)

## 2016-12-02 NOTE — Progress Notes (Signed)
      [address]       [place] [address]             [phone]      17 Days Post-Op Procedure(s) (LRB): VIDEO BRONCHOSCOPY (N/A) VIDEO ASSISTED THORACOSCOPY (VATS), MINI THORACOTOMY, DRAINAGE OF EMPYEMA, DECORTICATION, RIGHT LOWER LOBE RESECTION (Right) Subjective: States that she feels stronger each day. No issues overnight. She plans to walk in a little bit.   Objective: Vital signs in last 24 hours: Temp:  [98.2 F (36.8 C)-98.5 F (36.9 C)] 98.2 F (36.8 C) (04/14 0435) Pulse Rate:  [58-83] 77 (04/14 0800) Cardiac Rhythm: Normal sinus rhythm (04/13 2024) Resp:  [18-25] 19 (04/14 0800) BP: (113-155)/(42-72) 145/56 (04/14 0800) SpO2:  [97 %-100 %] 97 % (04/14 0800) Weight:  [59.8 kg (131 lb 14.4 oz)-60 kg (132 lb 3.2 oz)] 59.8 kg (131 lb 14.4 oz) (04/14 0435)     Intake/Output from previous day: 04/13 0701 - 04/14 0700 In: 1140 [P.O.:840; IV Piggyback:300] Out: 300 [Urine:300] Intake/Output this shift: No intake/output data recorded.  General appearance: alert, cooperative and no distress Heart: regular rate and rhythm, S1, S2 normal, no murmur, click, rub or gallop Lungs: clear to auscultation bilaterally Abdomen: soft, non-tender; bowel sounds normal; no masses,  no organomegaly Extremities: extremities normal, atraumatic, no cyanosis or edema Wound: clean and dry without evidence of infection. No drainage  Lab Results:  Recent Labs  12/01/16 0211 12/02/16 0259  WBC 9.3 9.3  HGB 8.9* 8.5*  HCT 29.3* 26.9*  PLT 394 410*   BMET:  Recent Labs  12/01/16 0211 12/02/16 0259  NA 133* 133*  K 4.1 4.7  CL 98* 97*  CO2 30 27  GLUCOSE 118* 124*  BUN 14 15  CREATININE 0.54 0.55  CALCIUM 8.3* 8.2*    PT/INR: No results for input(s): LABPROT, INR in the last 72 hours. ABG    Component Value Date/Time   PHART 7.334 (L) 11/22/2016 0506   HCO3 32.1 (H) 11/22/2016 0506   TCO2 29 11/16/2016 1139   ACIDBASEDEF 12.0 (H) 11/07/2016 2005     O2SAT 82.1 11/22/2016 0506   CBG (last 3)   Recent Labs  12/01/16 1652 12/01/16 2103 12/02/16 0749  GLUCAP 116* 144* 153*    Assessment/Plan: S/P Procedure(s) (LRB): VIDEO BRONCHOSCOPY (N/A) VIDEO ASSISTED THORACOSCOPY (VATS), MINI THORACOTOMY, DRAINAGE OF EMPYEMA, DECORTICATION, RIGHT LOWER LOBE RESECTION (Right)  1 conts with slow/steady progress 2 no fever overnight, no leukocytosis WBC 9.3k- on maxipime and vanc. Wounds are clean and dry without evidence of infection.  3 CXR from 4/12 is stable in appearance 4 push pulm toilet/rehab 5 med management per primary service    LOS: 27 days    Elgie Collard 12/02/2016

## 2016-12-02 NOTE — Progress Notes (Signed)
Pt with orders for cardiac monitoring and order to D/C cardiac monitoring written at the same time. Tele D/C'ed this AM. Please clarify cardiac monitoring order.

## 2016-12-02 NOTE — Progress Notes (Signed)
MD in hallway, signed pt daughter FMLA form. It is in the chart.  Clarified telemetry order, MD stated to discontinue cardiac monitor    Leory Plowman

## 2016-12-02 NOTE — Progress Notes (Signed)
Patient ID: Diana Gardner, female   DOB: 1951/05/11, 66 y.o.   MRN: [id number]                                                                PROGRESS NOTE                                                                                                        Patient Demographics:    Diana Gardner, is a 66 y.o. female, DOB - [birth year], GUY:[id number]  Admit date - 11/05/2016   Admitting Physician Edwin Dada, MD  Outpatient Primary MD for the patient is [name], MD  LOS - 80  Chief Complaint  Patient presents with  . Shortness of Breath      Brief Narrative    66 y.o.femalewith a PMH of COPD, chronic respiratory failure on home oxygen who was admitted 11/05/16 for a right lower lobe necrotizing community-acquired pneumonia complicated by pneumothorax and empyema status post right facets and right lower lobectomy on 11/15/16. She was intubated from 11/07/16 arrow forward 11/20/16 and under the care of the critical care team. She also required pressor support, which was weaned 11/09/16. Care transferred to [facility].   SIGNIFICANT EVENTS:  3/18 Admitted 3/20 transfer to ICU and intubated 3/21 refractory shock 3/22 Right PTX > chest tube placed. Pressors stopped 3/28 VATS, right mini thoracotomy, drainage of empyema, decortication, right lower lobectomy. 4/02 Extubated 4/03 Re-admitted to ICU for acute hypercarbic respiratory failure. Improved on BiPAP. 4/07 PCCM asked to see for desatn/mucus plug   Subjective:   Pt reports feeling better, had some rest last night and feels less short of breath.    Assessment  & Plan :   Acute on chronic hypoxemic hypercarbic respiratory failure Right lower lobe necrotizing pneumonia Right empyema status post VATS decortication and right lower lobe lobectomy  Right Pleural Effusion vs mucous plug  - CTS reviewed CXR and CT from 4/8, thought to be primarily atelectasis/ consolidation with a small amount of fluid, which would be expected  after a lobecomtomy - IR attempted thoracentesis but unable to perform on 4/9 as not enough fluid to aspirate - CXR 4/10 with notable for an improvement in the appearance of the right lung and with re expansion noted throughout - pt is also clinically better, ambulated yesterday and said she did well  - per PCCM, Continue chest PT > vest, flutter valve, mucomyst - Continue Duoneb Q4  - No role for FOB at this time  - continue vancomycin, cefepime day #8 (started on 11/25/2016) - appreciate PCCM and CTS teams input  - plan to change to Augmentin PO today  - Follow up with Pulmonary at discharge > see discharge section for details  Sepsis/septic shock secondary to necrotizing right lower lung pneumonia/empyema (admission 3/18) - Vanco/Rocephin/zithromax 3/18=>3/21 - Vanco 3/28=>3/29 -  Unasyn 3/21=>4/7 - Chest tube 3/22 secondary to right pneumothorax - R VATS, R minithoracotomy with drainage of empyema, decortication, right lower lobectomy 3/28 - Intubated 3/28, Extubated 4/2 - Sputum 3/18 negative - Blood cx 3/18 negative - Respiratory virus panel negative - Urine strep antigen positive - 2-4 week course of unasyn recommended by critical care team but due to Acute respiratory failure 4/7, started Vanco /cefepime 4/7=> will change to oral Augmentin  - continue same regimen per PCCM for now  Copd w exacerbation - Currently on prednisone taper, Duoneb, Pulmicort, Nacetylcysteine - overall stable and continue to slowly improve   Acute renal failure secondary to nsaid and ace inhibitor - resolved   Hyponatremia  - overall stable - will monitor   Normocytic anemia - Hg is down a bit in the past 24 hours but no signs of active bleeding - CBC in AM  Hyperlipidemia - Cont lipitor  Hypertension - Cont metoprolol - reasonable inpatient control   Steroid induced hyperglycemia - Cont ssi  Thrombocytosis  - From infection - improving, Plt WNL this AM  - CBC in AM  Code Status :  Full code  Family Communication  : w patient, no family at bedside  Disposition Plan  :  SNF on Monday   Barriers For Discharge : pending an improvement in respiratory status   Consults  :  pccm, CT surgery  Procedures  :  Chest tube 3/22 secondary to right pneumothorax  2-D echo 11/08/16 estimated ejection fraction was in the range of 65% to 70%. grade 1 DD  11/15/16 PROCEDURE:  BRONCHOSCOPY RIGHT VATS/ MINI THORRACOTOMY DRAINAGE OF EMPYEMA AND DECORTICATION RIGHT LOWER LOBECTOMY  DVT Prophylaxis     Lovenox -  SCDs   Cultures 3/20 Tracheal aspirate >> NGTD x 2 days 3/21 Respiratory viral panel negative 3/21 Blood cultures x 2 >> NGTD x 5 days 3/28 R pleural fluid NGTD x 5 days  Antibiotics  Vanco/Rocephin/zithromax 3/18 => 3/21 Vanco 3/28 => 3/29 Unasyn 3/21 =>4 /7 Vancomycin 4/7 => Cefepime 4/7 =>  Anti-infectives    Start     Dose/Rate Route Frequency Ordered Stop   11/26/16 0600  vancomycin (VANCOCIN) IVPB 750 mg/150 ml premix     750 mg 150 mL/hr over 60 Minutes Intravenous Every 12 hours 11/25/16 1810     11/25/16 1830  vancomycin (VANCOCIN) IVPB 1000 mg/200 mL premix     1,000 mg 200 mL/hr over 60 Minutes Intravenous  Once 11/25/16 1810 11/25/16 2100   11/25/16 1830  ceFEPIme (MAXIPIME) 1 g in dextrose 5 % 50 mL IVPB     1 g 100 mL/hr over 30 Minutes Intravenous Every 8 hours 11/25/16 1810     11/16/16 0000  vancomycin (VANCOCIN) IVPB 1000 mg/200 mL premix     1,000 mg 200 mL/hr over 60 Minutes Intravenous Every 12 hours 11/15/16 1711 11/16/16 0100   11/15/16 0715  vancomycin (VANCOCIN) IVPB 1000 mg/200 mL premix     1,000 mg 200 mL/hr over 60 Minutes Intravenous On call to O.R. 11/15/16 0706 11/15/16 1315   11/10/16 0142  Ampicillin-Sulbactam (UNASYN) 3 g in sodium chloride 0.9 % 100 mL IVPB  Status:  Discontinued     3 g 200 mL/hr over 30 Minutes Intravenous Every 8 hours 11/09/16 1809 11/25/16 1758   11/08/16 1700  Ampicillin-Sulbactam (UNASYN) 3 g  in sodium chloride 0.9 % 100 mL IVPB  Status:  Discontinued     3 g 200 mL/hr over 30 Minutes Intravenous  Every 12 hours 11/08/16 1632 11/09/16 1809   11/07/16 2100  vancomycin (VANCOCIN) IVPB 1000 mg/200 mL premix  Status:  Discontinued     1,000 mg 200 mL/hr over 60 Minutes Intravenous Every 48 hours 11/05/16 2144 11/06/16 0347   11/06/16 2100  cefTRIAXone (ROCEPHIN) 1 g in dextrose 5 % 50 mL IVPB  Status:  Discontinued     1 g 100 mL/hr over 30 Minutes Intravenous Every 24 hours 11/05/16 2326 11/08/16 1609   11/06/16 2100  azithromycin (ZITHROMAX) tablet 500 mg  Status:  Discontinued     500 mg Oral Every 24 hours 11/05/16 2326 11/07/16 1452   11/05/16 2200  azithromycin (ZITHROMAX) 500 mg in dextrose 5 % 250 mL IVPB     500 mg 250 mL/hr over 60 Minutes Intravenous  Once 11/05/16 2154 11/05/16 2315   11/05/16 1945  vancomycin (VANCOCIN) IVPB 1000 mg/200 mL premix     1,000 mg 200 mL/hr over 60 Minutes Intravenous  Once 11/05/16 1944 11/05/16 2209   11/05/16 1930  cefTRIAXone (ROCEPHIN) 1 g in dextrose 5 % 50 mL IVPB     1 g 100 mL/hr over 30 Minutes Intravenous  Once 11/05/16 1926 11/05/16 2023   11/05/16 1930  azithromycin (ZITHROMAX) 500 mg in dextrose 5 % 250 mL IVPB  Status:  Discontinued     500 mg 250 mL/hr over 60 Minutes Intravenous  Once 11/05/16 1926 11/05/16 2014        Objective:   Vitals:   12/01/16 2001 12/02/16 0019 12/02/16 0435 12/02/16 0800  BP: (!) 113/42 (!) 133/45 (!) 145/70 (!) 145/56  Pulse: 79 73 68 77  Resp: 18 18 20 19   Temp: 98.4 F (36.9 C) 98.3 F (36.8 C) 98.2 F (36.8 C)   TempSrc: Oral Oral Oral   SpO2: 100% 98% 97% 97%  Weight:   59.8 kg (131 lb 14.4 oz)   Height:        Wt Readings from Last 3 Encounters:  12/02/16 59.8 kg (131 lb 14.4 oz)  01/31/16 68.5 kg (151 lb)  11/23/15 69.4 kg (153 lb)     Intake/Output Summary (Last 24 hours) at 12/02/16 1111 Last data filed at 12/02/16 0900  Gross per 24 hour  Intake             1020  ml  Output              300 ml  Net              720 ml    Physical Exam Awake Alert, Oriented X 3, No new F.N deficits, Normal affect [place].AT,PERRAL Supple Neck,No JVD, No cervical lymphadenopathy appriciated.  Symmetrical Chest wall movement, slightly diminished breath sounds on the right side but no wheezing RRR,No Gallops,Rubs or new Murmurs, No Parasternal Heave +ve B.Sounds, Abd Soft, No tenderness, No organomegaly appriciated, No rebound - guarding or rigidity. No Cyanosis, Clubbing or edema, No new Rash or bruise     Data Review:   CBC  Recent Labs Lab 11/28/16 0219 11/29/16 0218 11/30/16 0212 12/01/16 0211 12/02/16 0259  WBC 8.1 7.8 8.6 9.3 9.3  HGB 8.7* 8.5* 8.3* 8.9* 8.5*  HCT 27.8* 26.2* 26.8* 29.3* 26.9*  PLT 430* 397 426* 394 410*  MCV 89.7 88.5 88.4 89.3 88.8  MCH 28.1 28.7 27.4 27.1 28.1  MCHC 31.3 32.4 31.0 30.4 31.6  RDW 17.4* 17.6* 17.0* 16.7* 16.9*    Chemistries   Recent Labs Lab 11/26/16 0155 11/28/16 2951  11/29/16 0218 11/30/16 0212 12/01/16 0211 12/02/16 0259  NA 137 135 134* 134* 133* 133*  K 3.9 4.3 3.9 3.9 4.1 4.7  CL 99* 98* 98* 99* 98* 97*  CO2 32 31 29 30 30 27   GLUCOSE 117* 128* 153* 126* 118* 124*  BUN 13 11 12 13 14 15   CREATININE 0.51 0.51 0.61 0.55 0.54 0.55  CALCIUM 8.2* 8.3* 8.2* 7.9* 8.3* 8.2*  AST 18  --   --   --   --   --   ALT 28  --   --   --   --   --   ALKPHOS 84  --   --   --   --   --   BILITOT 0.3  --   --   --   --   --    Inpatient Medications  Scheduled Meds: . acetylcysteine  4 mL Nebulization TID  . atorvastatin  20 mg Oral Daily  . budesonide (PULMICORT) nebulizer solution  0.5 mg Nebulization BID  . ceFEPime (MAXIPIME) IV  1 g Intravenous Q8H  . chlorhexidine  15 mL Mouth Rinse BID  . enoxaparin (LOVENOX) injection  40 mg Subcutaneous Q24H  . feeding supplement (ENSURE ENLIVE)  237 mL Oral BID BM  . insulin aspart  0-9 Units Subcutaneous TID WC  . ipratropium-albuterol  3 mL Nebulization TID  .  mouth rinse  15 mL Mouth Rinse q12n4p  . metoprolol tartrate  25 mg Oral BID  . saccharomyces boulardii  250 mg Oral BID  . vancomycin  750 mg Intravenous Q12H   Continuous Infusions: PRN Meds:.albuterol, fentaNYL (SUBLIMAZE) injection, Gerhardt's butt cream, labetalol, ondansetron (ZOFRAN) IV, oxyCODONE-acetaminophen  Micro Results Recent Results (from the past 240 hour(s))  C difficile quick scan w PCR reflex     Status: None   Collection Time: 11/25/16 12:46 PM  Result Value Ref Range Status   C Diff antigen NEGATIVE NEGATIVE Final   C Diff toxin NEGATIVE NEGATIVE Final   C Diff interpretation No C. difficile detected.  Final   Radiology Reports Dg Chest 2 View  Result Date: 11/30/2016 CLINICAL DATA:  Empyema. Hx of COPD, empyema, hypertension, and spontaneous pneumothorax. Pt had a video assisted thoracoscopy and mini thoracotomy with drainage of empyema in the right lower lobe of right lung in march 2018. EXAM: CHEST - 2 VIEW COMPARISON:  11/28/2016 FINDINGS: Small right pleural effusion as before. There is adjacent subsegmental atelectasis or consolidation at the right lung base. Heart size and mediastinal contours are within normal limits. No pneumothorax. Osteopenia. IMPRESSION: Persistent small right pleural effusion and adjacent right lower lobe consolidation/atelectasis. Electronically Signed   By: Lucrezia Europe M.D.   On: 11/30/2016 07:59   Dg Chest 2 View  Result Date: 11/28/2016 CLINICAL DATA:  Persistent cough and chest congestion. Admitted 3 weeks ago with right-sided pneumonia and atelectasis. Persistent cough and chest congestion EXAM: CHEST  2 VIEW COMPARISON:  Chest x-ray and chest CT scan of April 8th 2018 FINDINGS: There has been marked improvement in the appearance of the right lung with re-expansion noted throughout. A small right pleural effusion persists. There is a trace of pleural fluid on the left. The left lung is well-expanded and clear. The cardiac silhouette is  top-normal in size. The pulmonary vascularity is normal. There is calcification in the wall of the aortic arch. The trachea is midline. IMPRESSION: Marked improvement in the appearance of the right lung. Right lower lobe infiltrate and small pleural  effusion persist. A trace left pleural effusion is present. Thoracic aortic atherosclerosis. Electronically Signed   By: David  Martinique M.D.   On: 11/28/2016 09:30   Dg Chest 2 View  Result Date: 11/24/2016 CLINICAL DATA:  Recent right lower lobectomy for empyema. Currently with cough EXAM: CHEST  2 VIEW COMPARISON:  November 23, 2016 FINDINGS: There has been partial clearing of airspace consolidation from the lung bases compared to 1 day prior. There remains patchy bibasilar atelectasis with small pleural effusions bilaterally. Lungs elsewhere are clear. Note that there is volume loss on the right consistent with previous surgery. Heart size is normal. The pulmonary vascular is within normal limits. No adenopathy. No bone lesions. IMPRESSION: Bibasilar atelectasis with small pleural effusions. No consolidation. Stable volume loss on the right. No new opacity. Stable cardiac silhouette. No evident pneumothorax. Electronically Signed   By: Lowella Grip III M.D.   On: 11/24/2016 08:38   Dg Chest 2 View  Result Date: 11/05/2016 CLINICAL DATA:  Shortness of breath and cough. EXAM: CHEST  2 VIEW COMPARISON:  August 28, 2015 FINDINGS: The cardiomediastinal silhouette is stable. No pneumothorax. The left lung is clear. New effusion and underlying opacity seen on the right. IMPRESSION: New effusion and underlying opacity seen in the right lower lobe. No other interval changes. Recommend follow-up to resolution. Electronically Signed   By: Dorise Bullion III M.D   On: 11/05/2016 19:03   Ct Chest Wo Contrast  Result Date: 11/26/2016 CLINICAL DATA:  Right lower lobe surgery for necrotizing pneumonia 11/15/2016. Became short of breath last night with hypoxia. EXAM: CT  CHEST WITHOUT CONTRAST TECHNIQUE: Multidetector CT imaging of the chest was performed following the standard protocol without IV contrast. COMPARISON:  CT 11/13/2016, 02/19/2015 and 10/13/2005 as well as recent chest x-ray 11/26/2016. FINDINGS: Cardiovascular: Interval removal of central venous catheter. Mild cardiomegaly with minimal cardiomediastinal shift to the right compatible recent right lower lobectomy. Minimal calcified plaque over the left main and left anterior descending coronary arteries. Mild calcified plaque over the thoracic aorta. Mediastinum/Nodes: 1 cm precarinal lymph node without significant change likely reactive. No hilar adenopathy. Remaining mediastinal structures are unremarkable. Lungs/Pleura: Interval removal of endotracheal tube. Subtle patchy density over the left apex improved. Mild left basilar atelectasis. Surgical sutures over the posteromedial right lower thorax compatible with right lower lobectomy. Moderate-size right pleural effusion. Heterogeneous consolidation over the right mid to upper lung. No pneumothorax. Central right-sided bronchi not well aerated. Upper Abdomen: Unchanged. Musculoskeletal: Mild degenerative change of the spine. IMPRESSION: Postsurgical change compatible recent right lower lobectomy with volume loss of the right lung and cardiomediastinal shift to the right. Moderate-size right effusion tracking to the apex. Heterogeneous consolidation over the right mid to upper lung with decreased aeration of the central bronchi which may be due to persistent infection. Patchy opacification over the left apex improved. Left basilar atelectasis. Mild cardiomegaly. Mild atherosclerotic coronary artery disease. Aortic atherosclerosis. Electronically Signed   By: Marin Olp M.D.   On: 11/26/2016 17:04   Ct Chest Wo Contrast  Result Date: 11/13/2016 CLINICAL DATA:  66 year old female with history of loculated pleural effusion with chest tube in place. EXAM: CT CHEST  WITHOUT CONTRAST TECHNIQUE: Multidetector CT imaging of the chest was performed following the standard protocol without IV contrast. COMPARISON:  Chest CT 02/19/2015. Multiple recent prior chest radiographs. FINDINGS: Cardiovascular: Heart size is normal. There is no significant pericardial fluid, thickening or pericardial calcification. There is aortic atherosclerosis, as well as atherosclerosis of the great  vessels of the mediastinum and the coronary arteries, including calcified atherosclerotic plaque in the left main and left anterior descending coronary arteries. Left internal jugular central venous catheter with tip terminating in the superior aspect of the right atrium. Mediastinum/Nodes: Patient is intubated, with the tip of the endotracheal to the lying approximately 2.1 cm above the carina. Nasogastric tube extends into the stomach. Numerous borderline enlarged and mildly enlarged mediastinal and bilateral hilar lymph nodes, measuring up to 11 mm in short axis in the subcarinal nodal station. Esophagus is unremarkable in appearance. No axillary lymphadenopathy. Lungs/Pleura: Right-sided chest tube in position with tip directed into the medial aspect of the apex of the right hemithorax. Large complex right-sided pleural fluid and gas collection. Fluid ranges from low to intermediate attenuation, suggesting proteinaceous contents. Some of the pneumothorax component is confluent anteriorly, while other locules of gas are noted throughout the lower right hemithorax, some of which appear to be within collapsed/consolidated lung tissue, but other small locules of gas likely are present within loculated pleural fluid in the lower right hemithorax. In the medial aspect of the lower right hemithorax there is what appears to be a thick-walled cavitary area, which is favored to be within the lung parenchyma rather than in the pleural space, measuring up to 6.4 x 2.4 x 6.5 cm (axial image 83 of series 201, and coronal  image 56 of series 203). Areas of bronchiectasis are noted within the right lower lobe. Within the aerated portions of the lungs there is patchy multifocal ground-glass attenuation and septal thickening. The ground-glass attenuation is most evident in a peribronchovascular distribution, likely infectious/inflammatory in etiology, most severe in the left upper lobe. No significant left pleural effusion. Upper Abdomen: Unremarkable. Musculoskeletal: Extensive emphysema is noted throughout the subcutaneous fat of the chest wall bilaterally extending up into the cervical regions bilaterally (right greater than left). There are no aggressive appearing lytic or blastic lesions noted in the visualized portions of the skeleton. IMPRESSION: 1. Findings are most compatible with right lower lobe necrotizing pneumonia, with what appears to be a large right lower lobe cavity in the medial aspect of the basal segments of the right lower lobe, as well as a large right empyema. In this complex right pleural gas and fluid collections there both a small anterior pneumothorax, as well as multifocal loculated components of gas within the inferior aspect of the right pleural space, as above. Right-sided chest tube is directed into the medial aspect of the right apex, and the tip of the chest tube is currently surrounded by very little pleural fluid or gas. 2. Multifocal patchy interstitial and airspace disease asymmetrically distributed throughout the remaining aerated portions of the lungs, favored to reflect severe multilobar bronchopneumonia. 3. Aortic atherosclerosis, in addition to left main and left anterior descending coronary artery disease. Please note that although the presence of coronary artery calcium documents the presence of coronary artery disease, the severity of this disease and any potential stenosis cannot be assessed on this non-gated CT examination. Assessment for potential risk factor modification, dietary therapy  or pharmacologic therapy may be warranted, if clinically indicated. 4. Support apparatus, as above. Electronically Signed   By: Vinnie Langton M.D.   On: 11/13/2016 15:19   US Renal  Result Date: 11/06/2016 CLINICAL DATA:  Sepsis EXAM: RENAL / URINARY TRACT ULTRASOUND COMPLETE COMPARISON:  None. FINDINGS: Right Kidney: Length: 9.4 cm, within normal limits. Echogenicity within normal limits. No mass or hydronephrosis visualized. Left Kidney: Length: 10.9 cm, within normal  limits. Echogenicity within normal limits. No mass or hydronephrosis visualized. Bladder: Appears normal for degree of bladder distention. IMPRESSION: Negative bilateral renal ultrasound. Electronically Signed   By: San Morelle M.D.   On: 11/06/2016 12:41   Dg Chest Port 1 View  Result Date: 11/26/2016 CLINICAL DATA:  Respiratory failure. Hx of COPD, partial right lung removed(11/20/16), hypertension, primary spontaneous pneumothorax. Nonsmoker, 2nd hand smoke exposure. EXAM: PORTABLE CHEST 1 VIEW COMPARISON:  Chest x-rays dated 11/25/2016 and 11/24/2016. FINDINGS: There is now complete opacification the right hemithorax, presumably large pleural effusion and/or airspace collapse, favor large pleural effusion given the stable position of the mediastinum. There is stable volume loss on the right that is compatible with history of previous right lower lobe resection. Left lung is clear. Osseous structures about the chest are unremarkable. IMPRESSION: Complete opacification of the right hemithorax, significantly worsened compared to yesterday's exam, favor large pleural effusion. These results will be called to the ordering clinician or representative by the Radiologist Assistant, and communication documented in the PACS or zVision Dashboard. Electronically Signed   By: Franki Cabot M.D.   On: 11/26/2016 07:33   Dg Chest Port 1 View  Result Date: 11/25/2016 CLINICAL DATA:  Concern for spontaneous pneumothorax. Personal history of  spontaneous pneumothorax. Shortness of breath, acute onset. Initial encounter. EXAM: PORTABLE CHEST 1 VIEW COMPARISON:  Chest radiograph performed earlier today at 6:43 a.m. FINDINGS: A small right pleural effusion is suspected, with associated airspace consolidation. This raises concern for pneumonia, new from the prior study. Minimal left basilar opacity likely reflects atelectasis or scarring, stable from the prior study. No pneumothorax is seen. The patient is status post right lower lobectomy. Underlying chronic right pleural thickening is noted. The cardiomediastinal silhouette is mildly enlarged. No acute osseous abnormalities are identified. IMPRESSION: 1. Small right pleural effusion suspected, with associated airspace consolidation. This raises concern for pneumonia, new from the prior study. Minimal left basilar airspace opacity likely reflects atelectasis or scarring. 2. No evidence of pneumothorax. 3. Mild cardiomegaly. Electronically Signed   By: Garald Balding M.D.   On: 11/25/2016 17:46   Dg Chest Port 1 View  Result Date: 11/25/2016 CLINICAL DATA:  Respiratory failure EXAM: PORTABLE CHEST 1 VIEW COMPARISON:  Yesterday FINDINGS: Streaky densities at the bases, right more than left. Asymmetric elevation of the right diaphragm, increased. There is right pleural thickening reaching the apex. Borderline cardiomegaly, accentuated by technique. Small left effusions based on lateral film yesterday. No Kerley lines or pneumothorax. IMPRESSION: 1. Mildly increased atelectasis at the right base. 2. Right pleural thickening and lower lobectomy. Electronically Signed   By: Monte Fantasia M.D.   On: 11/25/2016 08:32   Dg Chest Port 1 View  Result Date: 11/23/2016 CLINICAL DATA:  CHEST TUBE REMOVAL/RIGHT EXAM: PORTABLE CHEST 1 VIEW COMPARISON:  11/23/2016 FINDINGS: Right chest tube has been removed. No pneumothorax. Heart size is accentuated by the AP portable technique. There are patchy densities in the  lung bases bilaterally which partially obscure the hemidiaphragms, increased on the left. Opacity at the left lung base now obscures the hemidiaphragm. No pulmonary edema. IMPRESSION: Bilateral lower lobe opacities, increased on the left. No pneumothorax following removal of chest tube. Electronically Signed   By: Nolon Nations M.D.   On: 11/23/2016 12:53   Dg Chest Port 1 View  Result Date: 11/23/2016 CLINICAL DATA:  Shortness of breath.  Pleural effusion . EXAM: PORTABLE CHEST 1 VIEW COMPARISON:  11/22/2016.  11/21/2016 . FINDINGS: Right chest tube in stable  position. No pneumothorax. Stable cardiomegaly. Interim improvement of pulmonary interstitial prominence suggesting improvement of pulmonary interstitial edema. Tiny bilateral pleural effusions. Stable biapical pleural thickening most likely secondary scarring . Prominent skin folds noted over the chest. IMPRESSION: 1. Right chest tube in stable position.  No pneumothorax. 2. Stable cardiomegaly. Interim improvement of pulmonary interstitial prominence suggesting improving interstitial edema. Tiny bilateral pleural effusions again noted. Electronically Signed   By: Marcello Moores  Register   On: 11/23/2016 07:34   Dg Chest Port 1 View  Result Date: 11/22/2016 CLINICAL DATA:  66 y/o  F; increasing shortness of breath. EXAM: PORTABLE CHEST 1 VIEW COMPARISON:  11/21/2016 chest radiograph. FINDINGS: Stable cardiac silhouette given projection and technique. Aortic atherosclerosis with calcification. Stable position of right-sided chest tube. Increase interstitial opacities in the lungs bilaterally probably represents developing interstitial pulmonary edema. Increasing opacity of the right lung base probably represents worsening pleural effusion and atelectasis. No acute osseous abnormality identified. IMPRESSION: Increase interstitial opacities probably representing developing edema. Increasing opacity of the right lung and right lung base, likely increasing  pleural effusion. No appreciable pneumothorax. Electronically Signed   By: Kristine Garbe M.D.   On: 11/22/2016 05:29   Dg Chest Port 1 View  Result Date: 11/21/2016 CLINICAL DATA:  History of pneumothorax.  Chest tube removal. EXAM: PORTABLE CHEST 1 VIEW COMPARISON:  11/20/2016. FINDINGS: Interim extubation. Interim removal of the lateral most chest tube. Two medial most chest tube is in stable position. Left IJ line in stable position . Heart size stable. Right base subsegmental atelectasis. Perihilar atelectasis/infiltrate. Small bilateral pleural effusions. No pneumothorax. IMPRESSION: 1. Interim extubation. Interim removal of lateral most chest tube. Two medial most chest tubes in stable position. Left IJ line stable position. No pneumothorax. 2. Persistent right base subsegmental atelectasis. Mild left perihilar atelectasis/ infiltrate noted on today's exam. Small bilateral pleural effusions. Electronically Signed   By: Marcello Moores  Register   On: 11/21/2016 06:45   Dg Chest Port 1 View  Result Date: 11/20/2016 CLINICAL DATA:  Status post thoracoscopy and empyema drainage with decortication on the right. EXAM: PORTABLE CHEST 1 VIEW COMPARISON:  Portable chest x-ray of November 19, 2016 FINDINGS: There remains mild volume loss on the right. There is no pneumothorax. The right-sided chest tube tip projects over the posterior aspect of the fourth rib. A second more medially positioned chest tube has its tip approximately 2 cm lateral to the hands. A third chest tube has its tip projecting over the medial aspect of the ninth rib. The left lung is well-expanded. Minimal basilar atelectasis is suspected on the left. The cardiac silhouette is top-normal in size. The pulmonary vascularity is not engorged. The endotracheal tube tip lies approximately 3.7 cm above the carina. The esophagogastric tube tip projects below the inferior margin of the image. The left internal jugular venous catheter tip projects over  the distal third of the SVC. IMPRESSION: Fairly stable appearance of the chest. No right-sided pneumothorax or significant pleural effusion. Minimal postsurgical volume loss due to lower lobectomy. Minimal left basilar atelectasis. The support tubes are in reasonable position. Electronically Signed   By: David  Martinique M.D.   On: 11/20/2016 07:08   Dg Chest Port 1 View  Result Date: 11/19/2016 CLINICAL DATA:  Pneumonia EXAM: PORTABLE CHEST 1 VIEW COMPARISON:  11/18/2016 FINDINGS: Three chest tubes on the right unchanged. No pneumothorax. Minimal right effusion unchanged. Mild bibasilar airspace disease unchanged Endotracheal tube 2.5 cm above the carina. Left jugular central venous catheter tip in the right  atrium. NG tube enters the stomach. IMPRESSION: No change from yesterday. Electronically Signed   By: Franchot Gallo M.D.   On: 11/19/2016 07:07   Dg Chest Port 1 View  Result Date: 11/18/2016 CLINICAL DATA:  Evaluate ETT. EXAM: PORTABLE CHEST 1 VIEW COMPARISON:  November 17, 2016 FINDINGS: The ETT is in good position. Right-sided chest tubes remain, in good position. The enteric tube terminates below today's study but the side port is below the GE junction within the stomach. No pneumothorax. The cardiomediastinal silhouette is stable. Small bilateral pleural effusions with underlying atelectasis, unchanged. A left-sided central line terminates near the caval atrial junction. It is possible it could extend into the right atrium. This appears to be unchanged in the interval. No overt edema. IMPRESSION: 1. Stable support apparatus. The distal tip of the right central line is near the caval atrial junction, either within the distal SVC or just within the right atrium. 2. Small bilateral effusions with underlying atelectasis remain. Electronically Signed   By: Dorise Bullion III M.D   On: 11/18/2016 07:26   Dg Chest Port 1 View  Result Date: 11/17/2016 CLINICAL DATA:  Chest 2.  Change in respirations and  breath sounds EXAM: PORTABLE CHEST 1 VIEW COMPARISON:  11/17/2016 FINDINGS: Right chest tube, endotracheal tube, NG tube and left central line remain in place, unchanged. Bibasilar atelectasis is stable. No pneumothorax. Probable small effusions. IMPRESSION: No pneumothorax. Continued bibasilar atelectasis and small effusions. Electronically Signed   By: Rolm Baptise M.D.   On: 11/17/2016 12:57   Dg Chest Port 1 View  Result Date: 11/17/2016 CLINICAL DATA:  Respiratory failure EXAM: PORTABLE CHEST 1 VIEW COMPARISON:  11/16/2016 FINDINGS: Endotracheal tube, nasogastric catheter and left jugular central line are again seen and stable. Chest tubes are noted on the right and stable. No pneumothorax is seen. Bibasilar atelectatic changes are seen. No bony abnormality is noted. IMPRESSION: Tubes and lines as described.  No pneumothorax is noted. Stable bibasilar changes. Electronically Signed   By: Inez Catalina M.D.   On: 11/17/2016 07:32   Dg Chest Port 1 View  Result Date: 11/16/2016 CLINICAL DATA:  Acute respiratory failure EXAM: PORTABLE CHEST 1 VIEW COMPARISON:  11/15/2016 FINDINGS: Cardiac shadow remains mildly enlarged. Endotracheal tube, nasogastric catheter and left jugular central line are again seen. Multiple right chest tubes are again noted and stable. No definitive pneumothorax is seen. Bibasilar infiltrative changes are noted right greater than left. The changes on the left has increased slightly in the interval from the prior exam. IMPRESSION: Postsurgical changes. No pneumothorax is seen. Bibasilar infiltrative changes are seen. Electronically Signed   By: Inez Catalina M.D.   On: 11/16/2016 07:34   Dg Chest Port 1 View  Result Date: 11/15/2016 CLINICAL DATA:  Right lower lobe lobectomy for pulmonary abscess/empyema EXAM: PORTABLE CHEST 1 VIEW COMPARISON:  11/15/2016 FINDINGS: The heart size and mediastinal contours are within normal limits. Endotracheal tube tip is slightly low lying at 2.4 cm  above the carina. Pullback approximately 1 cm recommended. Left IJ central line catheter is noted at the cavoatrial junction. Two right-sided chest tubes are identified projecting over the lung apex with decrease in previously noted loculated pleural opacity along the periphery of the right hemithorax. Bibasilar atelectasis is identified. No significant pneumothorax. Gastric tube extends below the left hemidiaphragm into the expected location the stomach. The tip is excluded however. No acute osseous abnormality appear IMPRESSION: 1. Slightly low-lying endotracheal tube tip approximately 2.4 cm above the carina. Pullback  approximately 1 cm recommended. Otherwise, satisfactory support line and tube positions. These results will be called to the ordering clinician or representative by the Radiologist Assistant, and communication documented in the PACS or zVision Dashboard. 2. Residual atelectasis at the right lung base without appreciable pneumothorax status post right lower lobectomy. Electronically Signed   By: Ashley Royalty M.D.   On: 11/15/2016 19:57   Dg Chest Port 1 View  Result Date: 11/15/2016 CLINICAL DATA:  Respiratory failure. EXAM: PORTABLE CHEST 1 VIEW COMPARISON:  11/14/2016 CT 11/13/2016 . FINDINGS: Endotracheal tube, NG tube, left IJ line, right chest tube in stable position. No definite pneumothorax identified. Previously identified anterior pneumothorax best identified on CT of 11/13/2016. Persistent basilar infiltrates, particularly prominent on the right. Persistent prominent right sided pleural effusions/possible empyema again noted. No change. IMPRESSION: 1. Lines and tubes including right chest tube in stable position. No pneumothorax identified. Previously identified anterior pneumothorax best demonstrated by prior CT of 11/13/2016. 2. Persistent loculated right pleural effusion/empyema. No acute change. Persistent basilar infiltrates, particular prominent on the right. No change.  Electronically Signed   By: Marcello Moores  Register   On: 11/15/2016 06:46   Dg Chest Port 1 View  Result Date: 11/14/2016 CLINICAL DATA:  Respiratory failure. EXAM: PORTABLE CHEST 1 VIEW COMPARISON:  CT 11/13/2016.  Chest x-ray 11/13/2016. FINDINGS: Endotracheal tube, NG tube, left IJ line, right chest tube stable position. Heart size stable. Bilateral pulmonary infiltrates with right side pleural effusion/ possible empyema again noted. Previously identified anterior pneumothorax is best demonstrated by CT of 11/13/2016. Mild chest wall subcutaneous emphysema again noted. IMPRESSION: 1. Lines and tubes including right chest tube in stable position. 2. Persistent bibasilar infiltrates. Persistent right sided prominent pleural effusion/ possible empyema again noted without significant change. Previously identified anterior pneumothorax best demonstrated about recent CT. No definite pneumothorax noted on this chest x-ray. Electronically Signed   By: Marcello Moores  Register   On: 11/14/2016 07:04   Dg Chest Port 1 View  Result Date: 11/13/2016 CLINICAL DATA:  Pneumothorax.  Shortness of breath . EXAM: PORTABLE CHEST 1 VIEW COMPARISON:  11/12/2016. FINDINGS: Endotracheal tube, left IJ line, NG tube, right chest tube in stable position. No pneumothorax. Persistent right side pleural effusion with increase in size from prior exam. Right base atelectasis. Heart size normal. Subcutaneous emphysema has improved. IMPRESSION: 1. Lines and tubes is including right chest tube in stable position. No pneumothorax. 2.  Interim increase in right pleural effusion. Electronically Signed   By: Marcello Moores  Register   On: 11/13/2016 06:47   Dg Chest Port 1 View  Result Date: 11/12/2016 CLINICAL DATA:  Patient with history of pneumothorax. EXAM: PORTABLE CHEST 1 VIEW COMPARISON:  Chest radiograph 11/11/2016 FINDINGS: ET tube terminates in the mid trachea. Central venous catheter tip projects over the right atrium. Monitoring leads overlie the  patient. Right-sided chest tube projects over the right lung apex. Stable cardiac and mediastinal contours. Persistent heterogeneous opacities right mid lower lung. Heterogeneous opacities left lung base. Moderate right pleural effusion. No definite pneumothorax. Subcutaneous emphysema. IMPRESSION: Stable support apparatus. Persistent moderate right pleural effusion with underlying opacities. No visible right-sided pneumothorax. Electronically Signed   By: Lovey Newcomer M.D.   On: 11/12/2016 08:40   Dg Chest Port 1 View  Result Date: 11/11/2016 CLINICAL DATA:  Continued surveillance pneumothorax. EXAM: PORTABLE CHEST 1 VIEW COMPARISON:  11/10/2016. FINDINGS: Unchanged cardiomediastinal silhouette and support apparatus. RIGHT chest tube remains in good position in the RIGHT lung apex. Visualized pneumothorax is less, although  RIGHT pleural effusion is increased. BILATERAL pulmonary opacities appear worse. Subcutaneous emphysema redemonstrated. IMPRESSION: No visible pneumothorax.  Increasing RIGHT effusion. Electronically Signed   By: Staci Righter M.D.   On: 11/11/2016 07:51   Dg Chest Port 1 View  Result Date: 11/10/2016 CLINICAL DATA:  Pneumothorax.  Chest tube. EXAM: PORTABLE CHEST 1 VIEW COMPARISON:  Chest x-ray 11/10/2016. FINDINGS: Endotracheal tube, left IJ line, NG tube, right chest tube in stable position. Heart size normal. Bilateral pulmonary infiltrates. Small right pneumothorax noted on today's exam. Diffuse chest wall subcutaneous emphysema again noted. IMPRESSION: 1. Lines and tubes including right chest tube in stable position. Small right pneumothorax noted on today's exam. Diffuse chest wall subcutaneous emphysema noted. 2. Diffuse bilateral pulmonary infiltrates. Critical Value/emergent results were called by telephone at the time of interpretation on 11/10/2016 at 3:17 pm to nurse Anderson Malta, who verbally acknowledged these results. Electronically Signed   By: Marcello Moores  Register   On: 11/10/2016  15:19   Dg Chest Port 1 View  Result Date: 11/10/2016 CLINICAL DATA:  Respiratory failure. EXAM: PORTABLE CHEST 1 VIEW COMPARISON:  11/09/2016. FINDINGS: Endotracheal tube, NG tube, left IJ line in stable position . Right chest tube in stable position. No pneumothorax. Diffuse bilateral chest wall and neck subcutaneous emphysema. Subcutaneous emphysema has increased significantly from prior exam. IMPRESSION: 1. Lines and tubes in stable position. Right chest tube in stable position. No pneumothorax. 2. Persistent bilateral pulmonary infiltrates, right greater than left. Small right pleural effusion. No interim change. 3. Interim significant progression of diffuse bilateral chest wall and bilateral neck subcutaneous emphysema. Electronically Signed   By: Marcello Moores  Register   On: 11/10/2016 07:13   Dg Chest Port 1 View  Result Date: 11/09/2016 CLINICAL DATA:  Post right chest tube placement EXAM: PORTABLE CHEST 1 VIEW COMPARISON:  11/09/2016 FINDINGS: Cardiomediastinal silhouette is stable. Persistent bilateral basilar infiltrates right greater than left. Small right pleural effusion. There is right chest tube in place with tip in right apex. No definite pneumothorax. Subcutaneous emphysema noted right axilla. Stable endotracheal and NG tube position. Stable left IJ central line position. IMPRESSION: Stable support apparatus. Persistent bilateral basilar infiltrates right greater than left. Small right pleural effusion. There is right chest tube in place with tip in right apex. No definite pneumothorax. Subcutaneous emphysema noted right axilla. Electronically Signed   By: Lahoma Crocker M.D.   On: 11/09/2016 10:27   Dg Chest Port 1 View  Result Date: 11/09/2016 CLINICAL DATA:  Respiratory failure EXAM: PORTABLE CHEST 1 VIEW COMPARISON:  11/08/2016 FINDINGS: Endotracheal tube, nasogastric catheter and left jugular central line are again seen and stable. Cardiac shadow is stable. Bibasilar infiltrates and  effusions are seen slightly worse on the right than the left. New left upper lobe infiltrative changes are noted. A new right-sided pneumothorax is noted with approximately 2 cm excursion at the apex at and 1 cm excursion laterally. IMPRESSION: New right pneumothorax. Bibasilar infiltrates as well as new left upper lobe infiltrate are seen. Effusions are seen right greater than left. Critical Value/emergent results were called by telephone at the time of interpretation on 11/09/2016 at 7:04 am to [facility], the pts nurse, who verbally acknowledged these results and will contact primary physician. Electronically Signed   By: Inez Catalina M.D.   On: 11/09/2016 07:05   Portable Chest Xray  Result Date: 11/08/2016 CLINICAL DATA:  Hypoxia EXAM: PORTABLE CHEST 1 VIEW COMPARISON:  November 07, 2016 FINDINGS: Endotracheal tube tip is 1.5 cm above the carina. Nasogastric  tube tip and side port are below the diaphragm. Central catheter tip is at the cavoatrial junction. No pneumothorax. There is airspace consolidation in both lower lobes, not significantly changed from 1 day prior. There are small pleural effusions bilaterally. Heart size and pulmonary vascularity are normal. There is atherosclerotic calcification in the aorta. No adenopathy. No bone lesions. IMPRESSION: Tube and catheter positions as described without evident pneumothorax. Airspace opacity suspicious for pneumonia in both lower lobes. Small pleural effusions bilaterally. No interstitial edema appreciable. Heart size normal. There is aortic atherosclerosis. Electronically Signed   By: Lowella Grip III M.D.   On: 11/08/2016 07:41   Dg Chest Port 1 View  Result Date: 11/07/2016 CLINICAL DATA:  Central line placement. EXAM: PORTABLE CHEST 1 VIEW COMPARISON:  Earlier today at 1412 hours FINDINGS: 1527 hours. Left internal jugular line is difficult to follow centrally secondary to overlying wires and leads. Terminates at least at the level of the high right  atrium. Endotracheal and nasogastric tubes are unchanged. Normal heart size. Atherosclerosis in the transverse aorta. Small left pleural effusion is unchanged. No pneumothorax. Bibasilar airspace disease is not significantly changed. Mild interstitial edema. IMPRESSION: Left internal jugular line is difficult to follow centrally secondary to overlying wires and leads. Followed to at least the level of the high right atrium. Consider retraction 2-3 cm with repeat imaging (ideally after removing EKG leads and wires). Otherwise, similar appearance of the chest with bibasilar airspace disease, left pleural fluid, and interstitial edema. Electronically Signed   By: Abigail Miyamoto M.D.   On: 11/07/2016 15:56   Dg Chest Port 1 View  Result Date: 11/07/2016 CLINICAL DATA:  Endotracheal tube EXAM: PORTABLE CHEST 1 VIEW COMPARISON:  03/09/2017 FINDINGS: Small bilateral pleural effusions. Right lower lobe airspace disease. No pneumothorax. Stable cardiomediastinal silhouette. Endotracheal to with the tip 3.5 cm above the carina. Nasogastric tube coursing below the diaphragm. IMPRESSION: 1. Small bilateral pleural effusions. Right lower lobe airspace disease concerning for pneumonia. 2. Small bilateral pleural effusions. Electronically Signed   By: Kathreen Devoid   On: 11/07/2016 14:29   Dg Chest Port 1 View  Result Date: 11/07/2016 CLINICAL DATA:  Pleural effusion EXAM: PORTABLE CHEST 1 VIEW COMPARISON:  Chest radiograph 11/05/2016 FINDINGS: Unchanged cardiomediastinal contours. Small right pleural effusion and associated basilar consolidation is unchanged. The left lung is clear. IMPRESSION: Unchanged small right pleural effusion and right basilar consolidation. Electronically Signed   By: Ulyses Jarred M.D.   On: 11/07/2016 02:43   Dg Abd Portable 1v  Result Date: 11/07/2016 CLINICAL DATA:  Enteric tube placement EXAM: PORTABLE ABDOMEN - 1 VIEW COMPARISON:  None. FINDINGS: Enteric tube terminates in the distal body  of the stomach. No disproportionately dilated small bowel loops. Mild colonic stool volume. No evidence of pneumatosis or pneumoperitoneum. Patchy bibasilar lung opacities. IMPRESSION: 1. Enteric tube terminates in the distal body of the stomach. 2. Nonobstructive bowel gas pattern. 3. Nonspecific patchy bibasilar lung opacities, correlate with chest radiograph. Electronically Signed   By: Ilona Sorrel M.D.   On: 11/07/2016 14:29   Ir US Chest  Result Date: 11/27/2016 CLINICAL DATA:  Patient with recent VATS and chest tube on the right side. Recent CT scan concerning for possible pleural effusion. Request is made for thoracentesis. EXAM: CHEST ULTRASOUND COMPARISON:  None. FINDINGS: Small area concerning for either lung consolidation or fluid with sedimentation present within the right inferior chest. IMPRESSION: Small area concerning for either lung consolidation or fluid with sedimentation within the right inferior  chest cavity. This was confirmed with Dr. Barbie Banner. Thoracentesis was deferred. If the patient's kidney function is normal and this needs to be further evaluated, he would recommend a CT scan of the chest with contrast as the most recent was without contrast. Read by: Saverio Danker, PA-C Electronically Signed   By: Marybelle Killings M.D.   On: 11/27/2016 13:29   Ir US Chest  Result Date: 11/06/2016 CLINICAL DATA:  Patient admitted with right-sided pneumonia. Recent imaging suggested possible right-sided pleural effusion. Request is made for thoracentesis. EXAM: CHEST ULTRASOUND COMPARISON:  None. FINDINGS: Minimal right-sided effusion. Most of what is seen is consolidation of the right lung secondary to pneumonia. IMPRESSION: Unable to perform thoracentesis secondary to minimal effusion. Read by: Saverio Danker, PA-C Electronically Signed   By: Aletta Edouard M.D.   On: 11/06/2016 11:57   Time Spent in minutes  30 min  Rebecca Eaton Magick- M.D on 12/02/2016 at 11:11 AM  Between 7am to 7pm - Pager -  [phone]  After 7pm go to [email or web address] - password [facility]  [facility] -  Office  [phone]

## 2016-12-03 LAB — GLUCOSE, CAPILLARY
GLUCOSE-CAPILLARY: 224 mg/dL — AB (ref 65–99)
GLUCOSE-CAPILLARY: 112 mg/dL — AB (ref 65–99)
Glucose-Capillary: 169 mg/dL — ABNORMAL HIGH (ref 65–99)
Glucose-Capillary: 153 mg/dL — ABNORMAL HIGH (ref 65–99)

## 2016-12-03 MED ORDER — AMOXICILLIN-POT CLAVULANATE 875-125 MG PO TABS
1.0000 | ORAL_TABLET | Freq: Two times a day (BID) | ORAL | Status: DC
  Administered 2016-12-03 – 2016-12-04 (×3): 1 via ORAL
  Filled 2016-12-03 (×3): qty 1

## 2016-12-03 NOTE — Progress Notes (Signed)
Pt is alert and oriented and calm  With no distress, 2L of o2 vitals stable. Plan for D/c to rehab tomorrow.

## 2016-12-03 NOTE — Progress Notes (Signed)
      [address]       [place] [address]             [phone]      18 Days Post-Op Procedure(s) (LRB): VIDEO BRONCHOSCOPY (N/A) VIDEO ASSISTED THORACOSCOPY (VATS), MINI THORACOTOMY, DRAINAGE OF EMPYEMA, DECORTICATION, RIGHT LOWER LOBE RESECTION (Right) Subjective: Shares that she is planning to walk in the hall today. Has been walking to and from the bathroom.   Objective: Vital signs in last 24 hours: Temp:  [98 F (36.7 C)-99.1 F (37.3 C)] 98.9 F (37.2 C) (04/15 0448) Pulse Rate:  [69-88] 78 (04/15 0448) Resp:  [18-20] 20 (04/15 0448) BP: (140-153)/(52-60) 153/60 (04/15 0448) SpO2:  [96 %-99 %] 98 % (04/15 0747) FiO2 (%):  [98 %] 98 % (04/14 2118) Weight:  [58.8 kg (129 lb 11.2 oz)] 58.8 kg (129 lb 11.2 oz) (04/15 0448)     Intake/Output from previous day: 04/14 0701 - 04/15 0700 In: 1800 [P.O.:1200; IV Piggyback:600] Out: 1850 [Urine:1850] Intake/Output this shift: Total I/O In: -  Out: 400 [Urine:400]  General appearance: alert, cooperative and no distress Heart: regular rate and rhythm, S1, S2 normal, no murmur, click, rub or gallop Lungs: clear to auscultation bilaterally Abdomen: soft, non-tender; bowel sounds normal; no masses,  no organomegaly Extremities: extremities normal, atraumatic, no cyanosis or edema Wound: clean and dry  Lab Results:  Recent Labs  12/01/16 0211 12/02/16 0259  WBC 9.3 9.3  HGB 8.9* 8.5*  HCT 29.3* 26.9*  PLT 394 410*   BMET:  Recent Labs  12/01/16 0211 12/02/16 0259  NA 133* 133*  K 4.1 4.7  CL 98* 97*  CO2 30 27  GLUCOSE 118* 124*  BUN 14 15  CREATININE 0.54 0.55  CALCIUM 8.3* 8.2*    PT/INR: No results for input(s): LABPROT, INR in the last 72 hours. ABG    Component Value Date/Time   PHART 7.334 (L) 11/22/2016 0506   HCO3 32.1 (H) 11/22/2016 0506   TCO2 29 11/16/2016 1139   ACIDBASEDEF 12.0 (H) 11/07/2016 2005   O2SAT 82.1 11/22/2016 0506   CBG (last 3)   Recent Labs  12/02/16 1626 12/02/16 2136 12/03/16 0731  GLUCAP 134* 146* 169*    Assessment/Plan: S/P Procedure(s) (LRB): VIDEO BRONCHOSCOPY (N/A) VIDEO ASSISTED THORACOSCOPY (VATS), MINI THORACOTOMY, DRAINAGE OF EMPYEMA, DECORTICATION, RIGHT LOWER LOBE RESECTION (Right)  1 conts with slow/steady progress 2 no fever overnight, no leukocytosis WBC 9.3k- on maxipime and vanc. Wounds are clean and dry without evidence of infection.  3 CXR from 4/12 is stable in appearance, Another CXR ordered for tomorrow morning. On 2L [place]. She uses home oxygen at night.  4 push pulm toilet/rehab 5 med management per primary service   LOS: 28 days    Elgie Collard 12/03/2016

## 2016-12-03 NOTE — Progress Notes (Signed)
Patient ID: Diana Gardner, female   DOB: 1951/01/24, 66 y.o.   MRN: [id number]                                                                PROGRESS NOTE                                                                                                        Patient Demographics:    Diana Gardner, is a 66 y.o. female, DOB - [birth year], FUX:[id number]  Admit date - 11/05/2016   Admitting Physician Edwin Dada, MD  Outpatient Primary MD for the patient is [name], MD  LOS - 28  Chief Complaint  Patient presents with  . Shortness of Breath      Brief Narrative    66 y.o.femalewith a PMH of COPD, chronic respiratory failure on home oxygen who was admitted 11/05/16 for a right lower lobe necrotizing community-acquired pneumonia complicated by pneumothorax and empyema status post right facets and right lower lobectomy on 11/15/16. She was intubated from 11/07/16 arrow forward 11/20/16 and under the care of the critical care team. She also required pressor support, which was weaned 11/09/16. Care transferred to [facility].   SIGNIFICANT EVENTS:  3/18 Admitted 3/20 transfer to ICU and intubated 3/21 refractory shock 3/22 Right PTX > chest tube placed. Pressors stopped 3/28 VATS, right mini thoracotomy, drainage of empyema, decortication, right lower lobectomy. 4/02 Extubated 4/03 Re-admitted to ICU for acute hypercarbic respiratory failure. Improved on BiPAP. 4/07 PCCM asked to see for desatn/mucus plug   Subjective:   Pt reports feeling better, had some rest last night and feels less short of breath.    Assessment  & Plan :   Acute on chronic hypoxemic hypercarbic respiratory failure Right lower lobe necrotizing pneumonia Right empyema status post VATS decortication and right lower lobe lobectomy  Right Pleural Effusion vs mucous plug  - CTS reviewed CXR and CT from 4/8, thought to be primarily atelectasis/ consolidation with a small amount of fluid, which would be expected  after a lobecomtomy - IR attempted thoracentesis but unable to perform on 4/9 as not enough fluid to aspirate - CXR 4/10 with notable for an improvement in the appearance of the right lung and with re expansion noted throughout - pt is also clinically better, ambulated yesterday and said she did well  - per PCCM, Continue chest PT > vest, flutter valve, mucomyst - Continue Duoneb Q4  - No role for FOB at this time  - continue vancomycin, cefepime day #8 (started on 11/25/2016) - appreciate PCCM and CTS teams input  - plan to change to Augmentin PO today as pt continues to improve  - Follow up with Pulmonary at discharge > see discharge section for details  Sepsis/septic shock secondary to necrotizing right lower lung pneumonia/empyema (admission 3/18) -  Vanco/Rocephin/zithromax 3/18=>3/21 - Vanco 3/28=>3/29 - Unasyn 3/21=>4/7 - Chest tube 3/22 secondary to right pneumothorax - R VATS, R minithoracotomy with drainage of empyema, decortication, right lower lobectomy 3/28 - Intubated 3/28, Extubated 4/2 - Sputum 3/18 negative - Blood cx 3/18 negative - Respiratory virus panel negative - Urine strep antigen positive - 2-4 week course of unasyn recommended by critical care team but due to Acute respiratory failure 4/7, started Vanco /cefepime 4/7=> will change to oral Augmentin   Copd w exacerbation - Currently on prednisone taper, Duoneb, Pulmicort, Nacetylcysteine - overall stable and continue to slowly improve   Acute renal failure secondary to nsaid and ace inhibitor - resolved   Hyponatremia  - overall stable - will monitor   Normocytic anemia - Hg is down a bit in the past 24 hours but no signs of active bleeding - CBC in AM  Hyperlipidemia - Cont lipitor  Hypertension - Cont metoprolol - reasonable inpatient control   Steroid induced hyperglycemia - Cont ssi  Thrombocytosis  - From infection - improving, Plt WNL this AM  - CBC in AM  Code Status : Full  code  Family Communication  : w patient, no family at bedside  Disposition Plan  :  SNF on Monday   Consults  :  pccm, CT surgery  Procedures  :  Chest tube 3/22 secondary to right pneumothorax  2-D echo 11/08/16 estimated ejection fraction was in the range of 65% to 70%. grade 1 DD  11/15/16 PROCEDURE:  BRONCHOSCOPY RIGHT VATS/ MINI THORRACOTOMY DRAINAGE OF EMPYEMA AND DECORTICATION RIGHT LOWER LOBECTOMY  DVT Prophylaxis     Lovenox -  SCDs   Cultures 3/20 Tracheal aspirate >> NGTD x 2 days 3/21 Respiratory viral panel negative 3/21 Blood cultures x 2 >> NGTD x 5 days 3/28 R pleural fluid NGTD x 5 days  Antibiotics  Vanco/Rocephin/zithromax 3/18 => 3/21 Vanco 3/28 => 3/29 Unasyn 3/21 =>4 /7 Vancomycin 4/7 => 4/15 Cefepime 4/7 => 4/15 Augmentin 4/15 =>  Anti-infectives    Start     Dose/Rate Route Frequency Ordered Stop   11/26/16 0600  vancomycin (VANCOCIN) IVPB 750 mg/150 ml premix     750 mg 150 mL/hr over 60 Minutes Intravenous Every 12 hours 11/25/16 1810     11/25/16 1830  vancomycin (VANCOCIN) IVPB 1000 mg/200 mL premix     1,000 mg 200 mL/hr over 60 Minutes Intravenous  Once 11/25/16 1810 11/25/16 2100   11/25/16 1830  ceFEPIme (MAXIPIME) 1 g in dextrose 5 % 50 mL IVPB     1 g 100 mL/hr over 30 Minutes Intravenous Every 8 hours 11/25/16 1810     11/16/16 0000  vancomycin (VANCOCIN) IVPB 1000 mg/200 mL premix     1,000 mg 200 mL/hr over 60 Minutes Intravenous Every 12 hours 11/15/16 1711 11/16/16 0100   11/15/16 0715  vancomycin (VANCOCIN) IVPB 1000 mg/200 mL premix     1,000 mg 200 mL/hr over 60 Minutes Intravenous On call to O.R. 11/15/16 0706 11/15/16 1315   11/10/16 0142  Ampicillin-Sulbactam (UNASYN) 3 g in sodium chloride 0.9 % 100 mL IVPB  Status:  Discontinued     3 g 200 mL/hr over 30 Minutes Intravenous Every 8 hours 11/09/16 1809 11/25/16 1758   11/08/16 1700  Ampicillin-Sulbactam (UNASYN) 3 g in sodium chloride 0.9 % 100 mL IVPB  Status:   Discontinued     3 g 200 mL/hr over 30 Minutes Intravenous Every 12 hours 11/08/16 1632 11/09/16 1809  11/07/16 2100  vancomycin (VANCOCIN) IVPB 1000 mg/200 mL premix  Status:  Discontinued     1,000 mg 200 mL/hr over 60 Minutes Intravenous Every 48 hours 11/05/16 2144 11/06/16 0347   11/06/16 2100  cefTRIAXone (ROCEPHIN) 1 g in dextrose 5 % 50 mL IVPB  Status:  Discontinued     1 g 100 mL/hr over 30 Minutes Intravenous Every 24 hours 11/05/16 2326 11/08/16 1609   11/06/16 2100  azithromycin (ZITHROMAX) tablet 500 mg  Status:  Discontinued     500 mg Oral Every 24 hours 11/05/16 2326 11/07/16 1452   11/05/16 2200  azithromycin (ZITHROMAX) 500 mg in dextrose 5 % 250 mL IVPB     500 mg 250 mL/hr over 60 Minutes Intravenous  Once 11/05/16 2154 11/05/16 2315   11/05/16 1945  vancomycin (VANCOCIN) IVPB 1000 mg/200 mL premix     1,000 mg 200 mL/hr over 60 Minutes Intravenous  Once 11/05/16 1944 11/05/16 2209   11/05/16 1930  cefTRIAXone (ROCEPHIN) 1 g in dextrose 5 % 50 mL IVPB     1 g 100 mL/hr over 30 Minutes Intravenous  Once 11/05/16 1926 11/05/16 2023   11/05/16 1930  azithromycin (ZITHROMAX) 500 mg in dextrose 5 % 250 mL IVPB  Status:  Discontinued     500 mg 250 mL/hr over 60 Minutes Intravenous  Once 11/05/16 1926 11/05/16 2014        Objective:   Vitals:   12/02/16 1200 12/02/16 2140 12/03/16 0448 12/03/16 0747  BP: (!) 140/52 (!) 149/58 (!) 153/60   Pulse: 69 88 78   Resp: 18 20 20    Temp: 98 F (36.7 C) 99.1 F (37.3 C) 98.9 F (37.2 C)   TempSrc: Oral Oral Oral   SpO2: 98% 96% 99% 98%  Weight:   58.8 kg (129 lb 11.2 oz)   Height:        Wt Readings from Last 3 Encounters:  12/03/16 58.8 kg (129 lb 11.2 oz)  01/31/16 68.5 kg (151 lb)  11/23/15 69.4 kg (153 lb)     Intake/Output Summary (Last 24 hours) at 12/03/16 1237 Last data filed at 12/03/16 0900  Gross per 24 hour  Intake             1120 ml  Output             1750 ml  Net             -630 ml     Physical Exam Awake Alert, Oriented X 3, No new F.N deficits, Normal affect [place].AT,PERRAL Supple Neck,No JVD, No cervical lymphadenopathy appriciated.  Symmetrical Chest wall movement, slightly diminished breath sounds at bases  RRR,No Gallops,Rubs or new Murmurs, No Parasternal Heave +ve B.Sounds, Abd Soft, No tenderness, No organomegaly appriciated, No rebound - guarding or rigidity. No Cyanosis, Clubbing or edema, No new Rash or bruise     Data Review:   CBC  Recent Labs Lab 11/28/16 0219 11/29/16 0218 11/30/16 0212 12/01/16 0211 12/02/16 0259  WBC 8.1 7.8 8.6 9.3 9.3  HGB 8.7* 8.5* 8.3* 8.9* 8.5*  HCT 27.8* 26.2* 26.8* 29.3* 26.9*  PLT 430* 397 426* 394 410*  MCV 89.7 88.5 88.4 89.3 88.8  MCH 28.1 28.7 27.4 27.1 28.1  MCHC 31.3 32.4 31.0 30.4 31.6  RDW 17.4* 17.6* 17.0* 16.7* 16.9*    Chemistries   Recent Labs Lab 11/28/16 0219 11/29/16 0218 11/30/16 0212 12/01/16 0211 12/02/16 0259  NA 135 134* 134* 133* 133*  K 4.3  3.9 3.9 4.1 4.7  CL 98* 98* 99* 98* 97*  CO2 31 29 30 30 27   GLUCOSE 128* 153* 126* 118* 124*  BUN 11 12 13 14 15   CREATININE 0.51 0.61 0.55 0.54 0.55  CALCIUM 8.3* 8.2* 7.9* 8.3* 8.2*   Inpatient Medications  Scheduled Meds: . acetylcysteine  4 mL Nebulization TID  . atorvastatin  20 mg Oral Daily  . budesonide (PULMICORT) nebulizer solution  0.5 mg Nebulization BID  . ceFEPime (MAXIPIME) IV  1 g Intravenous Q8H  . chlorhexidine  15 mL Mouth Rinse BID  . enoxaparin (LOVENOX) injection  40 mg Subcutaneous Q24H  . feeding supplement (ENSURE ENLIVE)  237 mL Oral BID BM  . insulin aspart  0-9 Units Subcutaneous TID WC  . ipratropium-albuterol  3 mL Nebulization TID  . mouth rinse  15 mL Mouth Rinse q12n4p  . metoprolol tartrate  25 mg Oral BID  . saccharomyces boulardii  250 mg Oral BID  . vancomycin  750 mg Intravenous Q12H   Continuous Infusions: PRN Meds:.albuterol, fentaNYL (SUBLIMAZE) injection, Gerhardt's butt cream,  labetalol, ondansetron (ZOFRAN) IV, oxyCODONE-acetaminophen  Micro Results Recent Results (from the past 240 hour(s))  C difficile quick scan w PCR reflex     Status: None   Collection Time: 11/25/16 12:46 PM  Result Value Ref Range Status   C Diff antigen NEGATIVE NEGATIVE Final   C Diff toxin NEGATIVE NEGATIVE Final   C Diff interpretation No C. difficile detected.  Final   Radiology Reports Dg Chest 2 View  Result Date: 11/30/2016 CLINICAL DATA:  Empyema. Hx of COPD, empyema, hypertension, and spontaneous pneumothorax. Pt had a video assisted thoracoscopy and mini thoracotomy with drainage of empyema in the right lower lobe of right lung in march 2018. EXAM: CHEST - 2 VIEW COMPARISON:  11/28/2016 FINDINGS: Small right pleural effusion as before. There is adjacent subsegmental atelectasis or consolidation at the right lung base. Heart size and mediastinal contours are within normal limits. No pneumothorax. Osteopenia. IMPRESSION: Persistent small right pleural effusion and adjacent right lower lobe consolidation/atelectasis. Electronically Signed   By: Lucrezia Europe M.D.   On: 11/30/2016 07:59   Dg Chest 2 View  Result Date: 11/28/2016 CLINICAL DATA:  Persistent cough and chest congestion. Admitted 3 weeks ago with right-sided pneumonia and atelectasis. Persistent cough and chest congestion EXAM: CHEST  2 VIEW COMPARISON:  Chest x-ray and chest CT scan of April 8th 2018 FINDINGS: There has been marked improvement in the appearance of the right lung with re-expansion noted throughout. A small right pleural effusion persists. There is a trace of pleural fluid on the left. The left lung is well-expanded and clear. The cardiac silhouette is top-normal in size. The pulmonary vascularity is normal. There is calcification in the wall of the aortic arch. The trachea is midline. IMPRESSION: Marked improvement in the appearance of the right lung. Right lower lobe infiltrate and small pleural effusion persist.  A trace left pleural effusion is present. Thoracic aortic atherosclerosis. Electronically Signed   By: David  Martinique M.D.   On: 11/28/2016 09:30   Dg Chest 2 View  Result Date: 11/24/2016 CLINICAL DATA:  Recent right lower lobectomy for empyema. Currently with cough EXAM: CHEST  2 VIEW COMPARISON:  November 23, 2016 FINDINGS: There has been partial clearing of airspace consolidation from the lung bases compared to 1 day prior. There remains patchy bibasilar atelectasis with small pleural effusions bilaterally. Lungs elsewhere are clear. Note that there is volume loss on the  right consistent with previous surgery. Heart size is normal. The pulmonary vascular is within normal limits. No adenopathy. No bone lesions. IMPRESSION: Bibasilar atelectasis with small pleural effusions. No consolidation. Stable volume loss on the right. No new opacity. Stable cardiac silhouette. No evident pneumothorax. Electronically Signed   By: Lowella Grip III M.D.   On: 11/24/2016 08:38   Dg Chest 2 View  Result Date: 11/05/2016 CLINICAL DATA:  Shortness of breath and cough. EXAM: CHEST  2 VIEW COMPARISON:  August 28, 2015 FINDINGS: The cardiomediastinal silhouette is stable. No pneumothorax. The left lung is clear. New effusion and underlying opacity seen on the right. IMPRESSION: New effusion and underlying opacity seen in the right lower lobe. No other interval changes. Recommend follow-up to resolution. Electronically Signed   By: Dorise Bullion III M.D   On: 11/05/2016 19:03   Ct Chest Wo Contrast  Result Date: 11/26/2016 CLINICAL DATA:  Right lower lobe surgery for necrotizing pneumonia 11/15/2016. Became short of breath last night with hypoxia. EXAM: CT CHEST WITHOUT CONTRAST TECHNIQUE: Multidetector CT imaging of the chest was performed following the standard protocol without IV contrast. COMPARISON:  CT 11/13/2016, 02/19/2015 and 10/13/2005 as well as recent chest x-ray 11/26/2016. FINDINGS: Cardiovascular: Interval  removal of central venous catheter. Mild cardiomegaly with minimal cardiomediastinal shift to the right compatible recent right lower lobectomy. Minimal calcified plaque over the left main and left anterior descending coronary arteries. Mild calcified plaque over the thoracic aorta. Mediastinum/Nodes: 1 cm precarinal lymph node without significant change likely reactive. No hilar adenopathy. Remaining mediastinal structures are unremarkable. Lungs/Pleura: Interval removal of endotracheal tube. Subtle patchy density over the left apex improved. Mild left basilar atelectasis. Surgical sutures over the posteromedial right lower thorax compatible with right lower lobectomy. Moderate-size right pleural effusion. Heterogeneous consolidation over the right mid to upper lung. No pneumothorax. Central right-sided bronchi not well aerated. Upper Abdomen: Unchanged. Musculoskeletal: Mild degenerative change of the spine. IMPRESSION: Postsurgical change compatible recent right lower lobectomy with volume loss of the right lung and cardiomediastinal shift to the right. Moderate-size right effusion tracking to the apex. Heterogeneous consolidation over the right mid to upper lung with decreased aeration of the central bronchi which may be due to persistent infection. Patchy opacification over the left apex improved. Left basilar atelectasis. Mild cardiomegaly. Mild atherosclerotic coronary artery disease. Aortic atherosclerosis. Electronically Signed   By: Marin Olp M.D.   On: 11/26/2016 17:04   Ct Chest Wo Contrast  Result Date: 11/13/2016 CLINICAL DATA:  66 year old female with history of loculated pleural effusion with chest tube in place. EXAM: CT CHEST WITHOUT CONTRAST TECHNIQUE: Multidetector CT imaging of the chest was performed following the standard protocol without IV contrast. COMPARISON:  Chest CT 02/19/2015. Multiple recent prior chest radiographs. FINDINGS: Cardiovascular: Heart size is normal. There is no  significant pericardial fluid, thickening or pericardial calcification. There is aortic atherosclerosis, as well as atherosclerosis of the great vessels of the mediastinum and the coronary arteries, including calcified atherosclerotic plaque in the left main and left anterior descending coronary arteries. Left internal jugular central venous catheter with tip terminating in the superior aspect of the right atrium. Mediastinum/Nodes: Patient is intubated, with the tip of the endotracheal to the lying approximately 2.1 cm above the carina. Nasogastric tube extends into the stomach. Numerous borderline enlarged and mildly enlarged mediastinal and bilateral hilar lymph nodes, measuring up to 11 mm in short axis in the subcarinal nodal station. Esophagus is unremarkable in appearance. No axillary lymphadenopathy. Lungs/Pleura:  Right-sided chest tube in position with tip directed into the medial aspect of the apex of the right hemithorax. Large complex right-sided pleural fluid and gas collection. Fluid ranges from low to intermediate attenuation, suggesting proteinaceous contents. Some of the pneumothorax component is confluent anteriorly, while other locules of gas are noted throughout the lower right hemithorax, some of which appear to be within collapsed/consolidated lung tissue, but other small locules of gas likely are present within loculated pleural fluid in the lower right hemithorax. In the medial aspect of the lower right hemithorax there is what appears to be a thick-walled cavitary area, which is favored to be within the lung parenchyma rather than in the pleural space, measuring up to 6.4 x 2.4 x 6.5 cm (axial image 83 of series 201, and coronal image 56 of series 203). Areas of bronchiectasis are noted within the right lower lobe. Within the aerated portions of the lungs there is patchy multifocal ground-glass attenuation and septal thickening. The ground-glass attenuation is most evident in a  peribronchovascular distribution, likely infectious/inflammatory in etiology, most severe in the left upper lobe. No significant left pleural effusion. Upper Abdomen: Unremarkable. Musculoskeletal: Extensive emphysema is noted throughout the subcutaneous fat of the chest wall bilaterally extending up into the cervical regions bilaterally (right greater than left). There are no aggressive appearing lytic or blastic lesions noted in the visualized portions of the skeleton. IMPRESSION: 1. Findings are most compatible with right lower lobe necrotizing pneumonia, with what appears to be a large right lower lobe cavity in the medial aspect of the basal segments of the right lower lobe, as well as a large right empyema. In this complex right pleural gas and fluid collections there both a small anterior pneumothorax, as well as multifocal loculated components of gas within the inferior aspect of the right pleural space, as above. Right-sided chest tube is directed into the medial aspect of the right apex, and the tip of the chest tube is currently surrounded by very little pleural fluid or gas. 2. Multifocal patchy interstitial and airspace disease asymmetrically distributed throughout the remaining aerated portions of the lungs, favored to reflect severe multilobar bronchopneumonia. 3. Aortic atherosclerosis, in addition to left main and left anterior descending coronary artery disease. Please note that although the presence of coronary artery calcium documents the presence of coronary artery disease, the severity of this disease and any potential stenosis cannot be assessed on this non-gated CT examination. Assessment for potential risk factor modification, dietary therapy or pharmacologic therapy may be warranted, if clinically indicated. 4. Support apparatus, as above. Electronically Signed   By: Vinnie Langton M.D.   On: 11/13/2016 15:19   US Renal  Result Date: 11/06/2016 CLINICAL DATA:  Sepsis EXAM: RENAL /  URINARY TRACT ULTRASOUND COMPLETE COMPARISON:  None. FINDINGS: Right Kidney: Length: 9.4 cm, within normal limits. Echogenicity within normal limits. No mass or hydronephrosis visualized. Left Kidney: Length: 10.9 cm, within normal limits. Echogenicity within normal limits. No mass or hydronephrosis visualized. Bladder: Appears normal for degree of bladder distention. IMPRESSION: Negative bilateral renal ultrasound. Electronically Signed   By: San Morelle M.D.   On: 11/06/2016 12:41   Dg Chest Port 1 View  Result Date: 11/26/2016 CLINICAL DATA:  Respiratory failure. Hx of COPD, partial right lung removed(11/20/16), hypertension, primary spontaneous pneumothorax. Nonsmoker, 2nd hand smoke exposure. EXAM: PORTABLE CHEST 1 VIEW COMPARISON:  Chest x-rays dated 11/25/2016 and 11/24/2016. FINDINGS: There is now complete opacification the right hemithorax, presumably large pleural effusion and/or airspace collapse,  favor large pleural effusion given the stable position of the mediastinum. There is stable volume loss on the right that is compatible with history of previous right lower lobe resection. Left lung is clear. Osseous structures about the chest are unremarkable. IMPRESSION: Complete opacification of the right hemithorax, significantly worsened compared to yesterday's exam, favor large pleural effusion. These results will be called to the ordering clinician or representative by the Radiologist Assistant, and communication documented in the PACS or zVision Dashboard. Electronically Signed   By: Franki Cabot M.D.   On: 11/26/2016 07:33   Dg Chest Port 1 View  Result Date: 11/25/2016 CLINICAL DATA:  Concern for spontaneous pneumothorax. Personal history of spontaneous pneumothorax. Shortness of breath, acute onset. Initial encounter. EXAM: PORTABLE CHEST 1 VIEW COMPARISON:  Chest radiograph performed earlier today at 6:43 a.m. FINDINGS: A small right pleural effusion is suspected, with associated  airspace consolidation. This raises concern for pneumonia, new from the prior study. Minimal left basilar opacity likely reflects atelectasis or scarring, stable from the prior study. No pneumothorax is seen. The patient is status post right lower lobectomy. Underlying chronic right pleural thickening is noted. The cardiomediastinal silhouette is mildly enlarged. No acute osseous abnormalities are identified. IMPRESSION: 1. Small right pleural effusion suspected, with associated airspace consolidation. This raises concern for pneumonia, new from the prior study. Minimal left basilar airspace opacity likely reflects atelectasis or scarring. 2. No evidence of pneumothorax. 3. Mild cardiomegaly. Electronically Signed   By: Garald Balding M.D.   On: 11/25/2016 17:46   Dg Chest Port 1 View  Result Date: 11/25/2016 CLINICAL DATA:  Respiratory failure EXAM: PORTABLE CHEST 1 VIEW COMPARISON:  Yesterday FINDINGS: Streaky densities at the bases, right more than left. Asymmetric elevation of the right diaphragm, increased. There is right pleural thickening reaching the apex. Borderline cardiomegaly, accentuated by technique. Small left effusions based on lateral film yesterday. No Kerley lines or pneumothorax. IMPRESSION: 1. Mildly increased atelectasis at the right base. 2. Right pleural thickening and lower lobectomy. Electronically Signed   By: Monte Fantasia M.D.   On: 11/25/2016 08:32   Dg Chest Port 1 View  Result Date: 11/23/2016 CLINICAL DATA:  CHEST TUBE REMOVAL/RIGHT EXAM: PORTABLE CHEST 1 VIEW COMPARISON:  11/23/2016 FINDINGS: Right chest tube has been removed. No pneumothorax. Heart size is accentuated by the AP portable technique. There are patchy densities in the lung bases bilaterally which partially obscure the hemidiaphragms, increased on the left. Opacity at the left lung base now obscures the hemidiaphragm. No pulmonary edema. IMPRESSION: Bilateral lower lobe opacities, increased on the left. No  pneumothorax following removal of chest tube. Electronically Signed   By: Nolon Nations M.D.   On: 11/23/2016 12:53   Dg Chest Port 1 View  Result Date: 11/23/2016 CLINICAL DATA:  Shortness of breath.  Pleural effusion . EXAM: PORTABLE CHEST 1 VIEW COMPARISON:  11/22/2016.  11/21/2016 . FINDINGS: Right chest tube in stable position. No pneumothorax. Stable cardiomegaly. Interim improvement of pulmonary interstitial prominence suggesting improvement of pulmonary interstitial edema. Tiny bilateral pleural effusions. Stable biapical pleural thickening most likely secondary scarring . Prominent skin folds noted over the chest. IMPRESSION: 1. Right chest tube in stable position.  No pneumothorax. 2. Stable cardiomegaly. Interim improvement of pulmonary interstitial prominence suggesting improving interstitial edema. Tiny bilateral pleural effusions again noted. Electronically Signed   By: Marcello Moores  Register   On: 11/23/2016 07:34   Dg Chest Port 1 View  Result Date: 11/22/2016 CLINICAL DATA:  66 y/o  F;  increasing shortness of breath. EXAM: PORTABLE CHEST 1 VIEW COMPARISON:  11/21/2016 chest radiograph. FINDINGS: Stable cardiac silhouette given projection and technique. Aortic atherosclerosis with calcification. Stable position of right-sided chest tube. Increase interstitial opacities in the lungs bilaterally probably represents developing interstitial pulmonary edema. Increasing opacity of the right lung base probably represents worsening pleural effusion and atelectasis. No acute osseous abnormality identified. IMPRESSION: Increase interstitial opacities probably representing developing edema. Increasing opacity of the right lung and right lung base, likely increasing pleural effusion. No appreciable pneumothorax. Electronically Signed   By: Kristine Garbe M.D.   On: 11/22/2016 05:29   Dg Chest Port 1 View  Result Date: 11/21/2016 CLINICAL DATA:  History of pneumothorax.  Chest tube removal. EXAM:  PORTABLE CHEST 1 VIEW COMPARISON:  11/20/2016. FINDINGS: Interim extubation. Interim removal of the lateral most chest tube. Two medial most chest tube is in stable position. Left IJ line in stable position . Heart size stable. Right base subsegmental atelectasis. Perihilar atelectasis/infiltrate. Small bilateral pleural effusions. No pneumothorax. IMPRESSION: 1. Interim extubation. Interim removal of lateral most chest tube. Two medial most chest tubes in stable position. Left IJ line stable position. No pneumothorax. 2. Persistent right base subsegmental atelectasis. Mild left perihilar atelectasis/ infiltrate noted on today's exam. Small bilateral pleural effusions. Electronically Signed   By: Marcello Moores  Register   On: 11/21/2016 06:45   Dg Chest Port 1 View  Result Date: 11/20/2016 CLINICAL DATA:  Status post thoracoscopy and empyema drainage with decortication on the right. EXAM: PORTABLE CHEST 1 VIEW COMPARISON:  Portable chest x-ray of November 19, 2016 FINDINGS: There remains mild volume loss on the right. There is no pneumothorax. The right-sided chest tube tip projects over the posterior aspect of the fourth rib. A second more medially positioned chest tube has its tip approximately 2 cm lateral to the hands. A third chest tube has its tip projecting over the medial aspect of the ninth rib. The left lung is well-expanded. Minimal basilar atelectasis is suspected on the left. The cardiac silhouette is top-normal in size. The pulmonary vascularity is not engorged. The endotracheal tube tip lies approximately 3.7 cm above the carina. The esophagogastric tube tip projects below the inferior margin of the image. The left internal jugular venous catheter tip projects over the distal third of the SVC. IMPRESSION: Fairly stable appearance of the chest. No right-sided pneumothorax or significant pleural effusion. Minimal postsurgical volume loss due to lower lobectomy. Minimal left basilar atelectasis. The support  tubes are in reasonable position. Electronically Signed   By: David  Martinique M.D.   On: 11/20/2016 07:08   Dg Chest Port 1 View  Result Date: 11/19/2016 CLINICAL DATA:  Pneumonia EXAM: PORTABLE CHEST 1 VIEW COMPARISON:  11/18/2016 FINDINGS: Three chest tubes on the right unchanged. No pneumothorax. Minimal right effusion unchanged. Mild bibasilar airspace disease unchanged Endotracheal tube 2.5 cm above the carina. Left jugular central venous catheter tip in the right atrium. NG tube enters the stomach. IMPRESSION: No change from yesterday. Electronically Signed   By: Franchot Gallo M.D.   On: 11/19/2016 07:07   Dg Chest Port 1 View  Result Date: 11/18/2016 CLINICAL DATA:  Evaluate ETT. EXAM: PORTABLE CHEST 1 VIEW COMPARISON:  November 17, 2016 FINDINGS: The ETT is in good position. Right-sided chest tubes remain, in good position. The enteric tube terminates below today's study but the side port is below the GE junction within the stomach. No pneumothorax. The cardiomediastinal silhouette is stable. Small bilateral pleural effusions with underlying atelectasis,  unchanged. A left-sided central line terminates near the caval atrial junction. It is possible it could extend into the right atrium. This appears to be unchanged in the interval. No overt edema. IMPRESSION: 1. Stable support apparatus. The distal tip of the right central line is near the caval atrial junction, either within the distal SVC or just within the right atrium. 2. Small bilateral effusions with underlying atelectasis remain. Electronically Signed   By: Dorise Bullion III M.D   On: 11/18/2016 07:26   Dg Chest Port 1 View  Result Date: 11/17/2016 CLINICAL DATA:  Chest 2.  Change in respirations and breath sounds EXAM: PORTABLE CHEST 1 VIEW COMPARISON:  11/17/2016 FINDINGS: Right chest tube, endotracheal tube, NG tube and left central line remain in place, unchanged. Bibasilar atelectasis is stable. No pneumothorax. Probable small effusions.  IMPRESSION: No pneumothorax. Continued bibasilar atelectasis and small effusions. Electronically Signed   By: Rolm Baptise M.D.   On: 11/17/2016 12:57   Dg Chest Port 1 View  Result Date: 11/17/2016 CLINICAL DATA:  Respiratory failure EXAM: PORTABLE CHEST 1 VIEW COMPARISON:  11/16/2016 FINDINGS: Endotracheal tube, nasogastric catheter and left jugular central line are again seen and stable. Chest tubes are noted on the right and stable. No pneumothorax is seen. Bibasilar atelectatic changes are seen. No bony abnormality is noted. IMPRESSION: Tubes and lines as described.  No pneumothorax is noted. Stable bibasilar changes. Electronically Signed   By: Inez Catalina M.D.   On: 11/17/2016 07:32   Dg Chest Port 1 View  Result Date: 11/16/2016 CLINICAL DATA:  Acute respiratory failure EXAM: PORTABLE CHEST 1 VIEW COMPARISON:  11/15/2016 FINDINGS: Cardiac shadow remains mildly enlarged. Endotracheal tube, nasogastric catheter and left jugular central line are again seen. Multiple right chest tubes are again noted and stable. No definitive pneumothorax is seen. Bibasilar infiltrative changes are noted right greater than left. The changes on the left has increased slightly in the interval from the prior exam. IMPRESSION: Postsurgical changes. No pneumothorax is seen. Bibasilar infiltrative changes are seen. Electronically Signed   By: Inez Catalina M.D.   On: 11/16/2016 07:34   Dg Chest Port 1 View  Result Date: 11/15/2016 CLINICAL DATA:  Right lower lobe lobectomy for pulmonary abscess/empyema EXAM: PORTABLE CHEST 1 VIEW COMPARISON:  11/15/2016 FINDINGS: The heart size and mediastinal contours are within normal limits. Endotracheal tube tip is slightly low lying at 2.4 cm above the carina. Pullback approximately 1 cm recommended. Left IJ central line catheter is noted at the cavoatrial junction. Two right-sided chest tubes are identified projecting over the lung apex with decrease in previously noted loculated  pleural opacity along the periphery of the right hemithorax. Bibasilar atelectasis is identified. No significant pneumothorax. Gastric tube extends below the left hemidiaphragm into the expected location the stomach. The tip is excluded however. No acute osseous abnormality appear IMPRESSION: 1. Slightly low-lying endotracheal tube tip approximately 2.4 cm above the carina. Pullback approximately 1 cm recommended. Otherwise, satisfactory support line and tube positions. These results will be called to the ordering clinician or representative by the Radiologist Assistant, and communication documented in the PACS or zVision Dashboard. 2. Residual atelectasis at the right lung base without appreciable pneumothorax status post right lower lobectomy. Electronically Signed   By: Ashley Royalty M.D.   On: 11/15/2016 19:57   Dg Chest Port 1 View  Result Date: 11/15/2016 CLINICAL DATA:  Respiratory failure. EXAM: PORTABLE CHEST 1 VIEW COMPARISON:  11/14/2016 CT 11/13/2016 . FINDINGS: Endotracheal tube, NG tube, left  IJ line, right chest tube in stable position. No definite pneumothorax identified. Previously identified anterior pneumothorax best identified on CT of 11/13/2016. Persistent basilar infiltrates, particularly prominent on the right. Persistent prominent right sided pleural effusions/possible empyema again noted. No change. IMPRESSION: 1. Lines and tubes including right chest tube in stable position. No pneumothorax identified. Previously identified anterior pneumothorax best demonstrated by prior CT of 11/13/2016. 2. Persistent loculated right pleural effusion/empyema. No acute change. Persistent basilar infiltrates, particular prominent on the right. No change. Electronically Signed   By: Marcello Moores  Register   On: 11/15/2016 06:46   Dg Chest Port 1 View  Result Date: 11/14/2016 CLINICAL DATA:  Respiratory failure. EXAM: PORTABLE CHEST 1 VIEW COMPARISON:  CT 11/13/2016.  Chest x-ray 11/13/2016. FINDINGS:  Endotracheal tube, NG tube, left IJ line, right chest tube stable position. Heart size stable. Bilateral pulmonary infiltrates with right side pleural effusion/ possible empyema again noted. Previously identified anterior pneumothorax is best demonstrated by CT of 11/13/2016. Mild chest wall subcutaneous emphysema again noted. IMPRESSION: 1. Lines and tubes including right chest tube in stable position. 2. Persistent bibasilar infiltrates. Persistent right sided prominent pleural effusion/ possible empyema again noted without significant change. Previously identified anterior pneumothorax best demonstrated about recent CT. No definite pneumothorax noted on this chest x-ray. Electronically Signed   By: Marcello Moores  Register   On: 11/14/2016 07:04   Dg Chest Port 1 View  Result Date: 11/13/2016 CLINICAL DATA:  Pneumothorax.  Shortness of breath . EXAM: PORTABLE CHEST 1 VIEW COMPARISON:  11/12/2016. FINDINGS: Endotracheal tube, left IJ line, NG tube, right chest tube in stable position. No pneumothorax. Persistent right side pleural effusion with increase in size from prior exam. Right base atelectasis. Heart size normal. Subcutaneous emphysema has improved. IMPRESSION: 1. Lines and tubes is including right chest tube in stable position. No pneumothorax. 2.  Interim increase in right pleural effusion. Electronically Signed   By: Marcello Moores  Register   On: 11/13/2016 06:47   Dg Chest Port 1 View  Result Date: 11/12/2016 CLINICAL DATA:  Patient with history of pneumothorax. EXAM: PORTABLE CHEST 1 VIEW COMPARISON:  Chest radiograph 11/11/2016 FINDINGS: ET tube terminates in the mid trachea. Central venous catheter tip projects over the right atrium. Monitoring leads overlie the patient. Right-sided chest tube projects over the right lung apex. Stable cardiac and mediastinal contours. Persistent heterogeneous opacities right mid lower lung. Heterogeneous opacities left lung base. Moderate right pleural effusion. No definite  pneumothorax. Subcutaneous emphysema. IMPRESSION: Stable support apparatus. Persistent moderate right pleural effusion with underlying opacities. No visible right-sided pneumothorax. Electronically Signed   By: Lovey Newcomer M.D.   On: 11/12/2016 08:40   Dg Chest Port 1 View  Result Date: 11/11/2016 CLINICAL DATA:  Continued surveillance pneumothorax. EXAM: PORTABLE CHEST 1 VIEW COMPARISON:  11/10/2016. FINDINGS: Unchanged cardiomediastinal silhouette and support apparatus. RIGHT chest tube remains in good position in the RIGHT lung apex. Visualized pneumothorax is less, although RIGHT pleural effusion is increased. BILATERAL pulmonary opacities appear worse. Subcutaneous emphysema redemonstrated. IMPRESSION: No visible pneumothorax.  Increasing RIGHT effusion. Electronically Signed   By: Staci Righter M.D.   On: 11/11/2016 07:51   Dg Chest Port 1 View  Result Date: 11/10/2016 CLINICAL DATA:  Pneumothorax.  Chest tube. EXAM: PORTABLE CHEST 1 VIEW COMPARISON:  Chest x-ray 11/10/2016. FINDINGS: Endotracheal tube, left IJ line, NG tube, right chest tube in stable position. Heart size normal. Bilateral pulmonary infiltrates. Small right pneumothorax noted on today's exam. Diffuse chest wall subcutaneous emphysema again noted. IMPRESSION:  1. Lines and tubes including right chest tube in stable position. Small right pneumothorax noted on today's exam. Diffuse chest wall subcutaneous emphysema noted. 2. Diffuse bilateral pulmonary infiltrates. Critical Value/emergent results were called by telephone at the time of interpretation on 11/10/2016 at 3:17 pm to nurse Anderson Malta, who verbally acknowledged these results. Electronically Signed   By: Marcello Moores  Register   On: 11/10/2016 15:19   Dg Chest Port 1 View  Result Date: 11/10/2016 CLINICAL DATA:  Respiratory failure. EXAM: PORTABLE CHEST 1 VIEW COMPARISON:  11/09/2016. FINDINGS: Endotracheal tube, NG tube, left IJ line in stable position . Right chest tube in stable  position. No pneumothorax. Diffuse bilateral chest wall and neck subcutaneous emphysema. Subcutaneous emphysema has increased significantly from prior exam. IMPRESSION: 1. Lines and tubes in stable position. Right chest tube in stable position. No pneumothorax. 2. Persistent bilateral pulmonary infiltrates, right greater than left. Small right pleural effusion. No interim change. 3. Interim significant progression of diffuse bilateral chest wall and bilateral neck subcutaneous emphysema. Electronically Signed   By: Marcello Moores  Register   On: 11/10/2016 07:13   Dg Chest Port 1 View  Result Date: 11/09/2016 CLINICAL DATA:  Post right chest tube placement EXAM: PORTABLE CHEST 1 VIEW COMPARISON:  11/09/2016 FINDINGS: Cardiomediastinal silhouette is stable. Persistent bilateral basilar infiltrates right greater than left. Small right pleural effusion. There is right chest tube in place with tip in right apex. No definite pneumothorax. Subcutaneous emphysema noted right axilla. Stable endotracheal and NG tube position. Stable left IJ central line position. IMPRESSION: Stable support apparatus. Persistent bilateral basilar infiltrates right greater than left. Small right pleural effusion. There is right chest tube in place with tip in right apex. No definite pneumothorax. Subcutaneous emphysema noted right axilla. Electronically Signed   By: Lahoma Crocker M.D.   On: 11/09/2016 10:27   Dg Chest Port 1 View  Result Date: 11/09/2016 CLINICAL DATA:  Respiratory failure EXAM: PORTABLE CHEST 1 VIEW COMPARISON:  11/08/2016 FINDINGS: Endotracheal tube, nasogastric catheter and left jugular central line are again seen and stable. Cardiac shadow is stable. Bibasilar infiltrates and effusions are seen slightly worse on the right than the left. New left upper lobe infiltrative changes are noted. A new right-sided pneumothorax is noted with approximately 2 cm excursion at the apex at and 1 cm excursion laterally. IMPRESSION: New right  pneumothorax. Bibasilar infiltrates as well as new left upper lobe infiltrate are seen. Effusions are seen right greater than left. Critical Value/emergent results were called by telephone at the time of interpretation on 11/09/2016 at 7:04 am to [facility], the pts nurse, who verbally acknowledged these results and will contact primary physician. Electronically Signed   By: Inez Catalina M.D.   On: 11/09/2016 07:05   Portable Chest Xray  Result Date: 11/08/2016 CLINICAL DATA:  Hypoxia EXAM: PORTABLE CHEST 1 VIEW COMPARISON:  November 07, 2016 FINDINGS: Endotracheal tube tip is 1.5 cm above the carina. Nasogastric tube tip and side port are below the diaphragm. Central catheter tip is at the cavoatrial junction. No pneumothorax. There is airspace consolidation in both lower lobes, not significantly changed from 1 day prior. There are small pleural effusions bilaterally. Heart size and pulmonary vascularity are normal. There is atherosclerotic calcification in the aorta. No adenopathy. No bone lesions. IMPRESSION: Tube and catheter positions as described without evident pneumothorax. Airspace opacity suspicious for pneumonia in both lower lobes. Small pleural effusions bilaterally. No interstitial edema appreciable. Heart size normal. There is aortic atherosclerosis. Electronically Signed   By:  Lowella Grip III M.D.   On: 11/08/2016 07:41   Dg Chest Port 1 View  Result Date: 11/07/2016 CLINICAL DATA:  Central line placement. EXAM: PORTABLE CHEST 1 VIEW COMPARISON:  Earlier today at 1412 hours FINDINGS: 1527 hours. Left internal jugular line is difficult to follow centrally secondary to overlying wires and leads. Terminates at least at the level of the high right atrium. Endotracheal and nasogastric tubes are unchanged. Normal heart size. Atherosclerosis in the transverse aorta. Small left pleural effusion is unchanged. No pneumothorax. Bibasilar airspace disease is not significantly changed. Mild interstitial  edema. IMPRESSION: Left internal jugular line is difficult to follow centrally secondary to overlying wires and leads. Followed to at least the level of the high right atrium. Consider retraction 2-3 cm with repeat imaging (ideally after removing EKG leads and wires). Otherwise, similar appearance of the chest with bibasilar airspace disease, left pleural fluid, and interstitial edema. Electronically Signed   By: Abigail Miyamoto M.D.   On: 11/07/2016 15:56   Dg Chest Port 1 View  Result Date: 11/07/2016 CLINICAL DATA:  Endotracheal tube EXAM: PORTABLE CHEST 1 VIEW COMPARISON:  03/09/2017 FINDINGS: Small bilateral pleural effusions. Right lower lobe airspace disease. No pneumothorax. Stable cardiomediastinal silhouette. Endotracheal to with the tip 3.5 cm above the carina. Nasogastric tube coursing below the diaphragm. IMPRESSION: 1. Small bilateral pleural effusions. Right lower lobe airspace disease concerning for pneumonia. 2. Small bilateral pleural effusions. Electronically Signed   By: Kathreen Devoid   On: 11/07/2016 14:29   Dg Chest Port 1 View  Result Date: 11/07/2016 CLINICAL DATA:  Pleural effusion EXAM: PORTABLE CHEST 1 VIEW COMPARISON:  Chest radiograph 11/05/2016 FINDINGS: Unchanged cardiomediastinal contours. Small right pleural effusion and associated basilar consolidation is unchanged. The left lung is clear. IMPRESSION: Unchanged small right pleural effusion and right basilar consolidation. Electronically Signed   By: Ulyses Jarred M.D.   On: 11/07/2016 02:43   Dg Abd Portable 1v  Result Date: 11/07/2016 CLINICAL DATA:  Enteric tube placement EXAM: PORTABLE ABDOMEN - 1 VIEW COMPARISON:  None. FINDINGS: Enteric tube terminates in the distal body of the stomach. No disproportionately dilated small bowel loops. Mild colonic stool volume. No evidence of pneumatosis or pneumoperitoneum. Patchy bibasilar lung opacities. IMPRESSION: 1. Enteric tube terminates in the distal body of the stomach. 2.  Nonobstructive bowel gas pattern. 3. Nonspecific patchy bibasilar lung opacities, correlate with chest radiograph. Electronically Signed   By: Ilona Sorrel M.D.   On: 11/07/2016 14:29   Ir US Chest  Result Date: 11/27/2016 CLINICAL DATA:  Patient with recent VATS and chest tube on the right side. Recent CT scan concerning for possible pleural effusion. Request is made for thoracentesis. EXAM: CHEST ULTRASOUND COMPARISON:  None. FINDINGS: Small area concerning for either lung consolidation or fluid with sedimentation present within the right inferior chest. IMPRESSION: Small area concerning for either lung consolidation or fluid with sedimentation within the right inferior chest cavity. This was confirmed with Dr. Barbie Banner. Thoracentesis was deferred. If the patient's kidney function is normal and this needs to be further evaluated, he would recommend a CT scan of the chest with contrast as the most recent was without contrast. Read by: Saverio Danker, PA-C Electronically Signed   By: Marybelle Killings M.D.   On: 11/27/2016 13:29   Ir US Chest  Result Date: 11/06/2016 CLINICAL DATA:  Patient admitted with right-sided pneumonia. Recent imaging suggested possible right-sided pleural effusion. Request is made for thoracentesis. EXAM: CHEST ULTRASOUND COMPARISON:  None. FINDINGS:  Minimal right-sided effusion. Most of what is seen is consolidation of the right lung secondary to pneumonia. IMPRESSION: Unable to perform thoracentesis secondary to minimal effusion. Read by: Saverio Danker, PA-C Electronically Signed   By: Aletta Edouard M.D.   On: 11/06/2016 11:57   Time Spent in minutes  30 min  Rebecca Eaton Magick-Myers M.D on 12/03/2016 at 12:37 PM  Between 7am to 7pm - Pager - [phone]  After 7pm go to [email or web address] - password [facility]  [facility] -  Office  [phone]

## 2016-12-04 ENCOUNTER — Inpatient Hospital Stay (HOSPITAL_COMMUNITY): Payer: Medicare Other

## 2016-12-04 LAB — CBC
HCT: 26.7 % — ABNORMAL LOW (ref 36.0–46.0)
HEMOGLOBIN: 8.6 g/dL — AB (ref 12.0–15.0)
MCH: 28.5 pg (ref 26.0–34.0)
MCHC: 32.2 g/dL (ref 30.0–36.0)
MCV: 88.4 fL (ref 78.0–100.0)
PLATELETS: 366 10*3/uL (ref 150–400)
RBC: 3.02 MIL/uL — AB (ref 3.87–5.11)
RDW: 16.8 % — ABNORMAL HIGH (ref 11.5–15.5)
WBC: 9.7 10*3/uL (ref 4.0–10.5)

## 2016-12-04 LAB — BASIC METABOLIC PANEL
Anion gap: 7 (ref 5–15)
BUN: 13 mg/dL (ref 6–20)
CHLORIDE: 95 mmol/L — AB (ref 101–111)
CO2: 31 mmol/L (ref 22–32)
Calcium: 8.5 mg/dL — ABNORMAL LOW (ref 8.9–10.3)
Creatinine, Ser: 0.52 mg/dL (ref 0.44–1.00)
Glucose, Bld: 125 mg/dL — ABNORMAL HIGH (ref 65–99)
POTASSIUM: 4.5 mmol/L (ref 3.5–5.1)
SODIUM: 133 mmol/L — AB (ref 135–145)

## 2016-12-04 LAB — GLUCOSE, CAPILLARY
GLUCOSE-CAPILLARY: 142 mg/dL — AB (ref 65–99)
Glucose-Capillary: 177 mg/dL — ABNORMAL HIGH (ref 65–99)

## 2016-12-04 MED ORDER — ALPRAZOLAM 0.25 MG PO TABS: 0.2500 mg | ORAL_TABLET | Freq: Every day | ORAL | 0 refills | Status: DC | PRN

## 2016-12-04 MED ORDER — BUDESONIDE 0.5 MG/2ML IN SUSP: 0.5000 mg | Freq: Two times a day (BID) | RESPIRATORY_TRACT | 12 refills | Status: AC

## 2016-12-04 MED ORDER — GERHARDT'S BUTT CREAM: 1.0000 "application " | TOPICAL_CREAM | CUTANEOUS | 1 refills | Status: DC | PRN

## 2016-12-04 MED ORDER — OXYCODONE-ACETAMINOPHEN 5-325 MG PO TABS: 1.0000 | ORAL_TABLET | Freq: Four times a day (QID) | ORAL | 0 refills | Status: DC | PRN

## 2016-12-04 MED ORDER — ACETYLCYSTEINE 20 % IN SOLN: 4.0000 mL | Freq: Three times a day (TID) | RESPIRATORY_TRACT | 12 refills | Status: DC

## 2016-12-04 MED ORDER — SACCHAROMYCES BOULARDII 250 MG PO CAPS: 250.0000 mg | ORAL_CAPSULE | Freq: Two times a day (BID) | ORAL | Status: AC

## 2016-12-04 MED ORDER — METOPROLOL TARTRATE 25 MG PO TABS: 25.0000 mg | ORAL_TABLET | Freq: Two times a day (BID) | ORAL | 0 refills | Status: AC

## 2016-12-04 MED ORDER — AMOXICILLIN-POT CLAVULANATE 875-125 MG PO TABS: 1.0000 | ORAL_TABLET | Freq: Two times a day (BID) | ORAL | 0 refills | Status: DC

## 2016-12-04 NOTE — Progress Notes (Signed)
Pt is asleep with no distress noted.

## 2016-12-04 NOTE — Clinical Social Work Placement (Signed)
   CLINICAL SOCIAL WORK PLACEMENT  NOTE  Date:  12/04/2016  Patient Details  Name: Diana Gardner MRN: [id number] Date of Birth: 22-Oct-1950  Clinical Social Work is seeking post-discharge placement for this patient at the [place] level of care (*CSW will initial, date and re-position this form in  chart as items are completed):  Yes   Patient/family provided with [place] Work Department's list of facilities offering this level of care within the geographic area requested by the patient (or if unable, by the patient's family).  Yes   Patient/family informed of their freedom to choose among providers that offer the needed level of care, that participate in Medicare, Medicaid or managed care program needed by the patient, have an available bed and are willing to accept the patient.  Yes   Patient/family informed of [facility]'s ownership interest in [facility] and [facility], as well as of the fact that they are under no obligation to receive care at these facilities.  PASRR submitted to EDS on       PASRR number received on 12/01/16     Existing PASRR number confirmed on       FL2 transmitted to all facilities in geographic area requested by pt/family on 12/01/16     FL2 transmitted to all facilities within larger geographic area on 12/01/16     Patient informed that his/her managed care company has contracts with or will negotiate with certain facilities, including the following:        Yes   Patient/family informed of bed offers received.  Patient chooses bed at [place], [place]     Physician recommends and patient chooses bed at      Patient to be transferred to [place], [place] on 12/04/16.  Patient to be transferred to facility by [facility]     Patient family notified on 12/04/16 of transfer.  Name of family member notified:  [facility]     PHYSICIAN       Additional Comment:     _______________________________________________ Candie Chroman, LCSW 12/04/2016, 12:40 PM

## 2016-12-04 NOTE — Progress Notes (Signed)
Physical Therapy Treatment Patient Details Name: Diana Gardner MRN: [id number] DOB: May 17, 1951 Today's Date: 12/04/2016    History of Present Illness Pt is a 66 y/o female admitted 11/05/16 with sepsis, PNA, and empyema. Pt underwent bronchoscopy, right video-assisted thoracoscopy, right minithoracotomy, drainage of empyema, decortication, right lower lobectomy on 3/28. Of note, pt was intubated from 3/20-28. Pertinent PMH includes COPD, HTN.     PT Comments    Pt progressing with activity and able to ambulate long hall distance without 2L and no AD. Pt educated for HEP and encouraged to continue to perform along with daily mobility. Will continue to follow to progress toward independence.   HR 83-91 sats 95-95% on 2L throughout   Follow Up Recommendations  Supervision/Assistance - 24 hour;SNF     Equipment Recommendations  None recommended by PT    Recommendations for Other Services       Precautions / Restrictions Precautions Precautions: Fall    Mobility  Bed Mobility Overal bed mobility: Modified Independent                Transfers Overall transfer level: Modified independent                  Ambulation/Gait Ambulation/Gait assistance: Supervision Ambulation Distance (Feet): 400 Feet Assistive device: None Gait Pattern/deviations: Step-through pattern;Decreased stride length   Gait velocity interpretation: Below normal speed for age/gender General Gait Details: pt able to ambulate without RW without LOB or SOB on 2L   Stairs            Wheelchair Mobility    Modified Rankin (Stroke Patients Only)       Balance Overall balance assessment: No apparent balance deficits (not formally assessed)   Sitting balance-Leahy Scale: Good       Standing balance-Leahy Scale: Good                              Cognition Arousal/Alertness: Awake/alert Behavior During Therapy: WFL for tasks assessed/performed Overall Cognitive  Status: Within Functional Limits for tasks assessed                                        Exercises General Exercises - Lower Extremity Long Arc Quad: AROM;20 reps;Both;Seated Hip Flexion/Marching: AROM;20 reps;Both;Seated    General Comments        Pertinent Vitals/Pain Pain Assessment: No/denies pain    Home Living                      Prior Function            PT Goals (current goals can now be found in the care plan section) Progress towards PT goals: Progressing toward goals    Frequency           PT Plan Current plan remains appropriate    Co-evaluation             End of Session Equipment Utilized During Treatment: Oxygen Activity Tolerance: Patient tolerated treatment well Patient left: in chair;with call bell/phone within reach Nurse Communication: Mobility status PT Visit Diagnosis: Difficulty in walking, not elsewhere classified (R26.2)     Time: 0092-3300 PT Time Calculation (min) (ACUTE ONLY): 19 min  Charges:  $Gait Training: 8-22 mins  G Codes:       Elwyn Reach, [place]    Sandy Salaam  12/04/2016, 12:18 PM

## 2016-12-04 NOTE — Progress Notes (Signed)
Report given to Michelle at [facility]. °

## 2016-12-04 NOTE — Care Management Important Message (Signed)
Important Message  Patient Details  Name: Diana Gardner MRN: [id number] Date of Birth: 13-Mar-1951   Medicare Important Message Given:  Yes     Montine Circle 12/04/2016, 1:18 PM

## 2016-12-04 NOTE — Care Management Important Message (Signed)
Important Message  Patient Details  Name: Diana Gardner MRN: [id number] Date of Birth: 02-05-1951   Medicare Important Message Given:  Yes     Montine Circle 12/04/2016, 1:53 PM

## 2016-12-04 NOTE — Consult Note (Signed)
   [facility] Inpatient Consult   12/04/2016  Diana Gardner 1950/12/09 [id number]  Patient was screened for [place] [facility] services with [facility].  Chart review reveals there patient is a 66 y.o.femalewith a PMH of COPD, chronic respiratory failure on home oxygen who was admitted 11/05/16 for a right lower lobe necrotizing community-acquired pneumonia complicated by pneumothorax and empyema status post right facets and right lower lobectomy on 11/15/16. She was intubated from 11/07/16 until 11/20/16 and under the care of the critical care team. Patient noted per chart for a skilled nursing facility stay for rehab, short term at this time.   Met with the patient at the bedside.  She confirms that her son stays with her and if she needed to stay with her daughter after her rehab that would also be an option.  She states she feels once she gets out of rehab she didn't feel she would need care management.  She states, "my son and daughter are good at helping me make decisions for when I get back home."  Encouraged her to call if she has needs.  A brochure was given with 24 hour nurse line and contact information given.  Please contact for questions,  Natividad Brood, RN BSN [facility] Liaison  [phone] business mobile phone Toll free office [phone]

## 2016-12-04 NOTE — Discharge Summary (Signed)
Physician Discharge Summary  KITTY CADAVID HAL:[id number] DOB: 29-Sep-1950 DOA: 11/05/2016  PCP: [name], MD  Admit date: 11/05/2016 Discharge date: 12/04/2016  Recommendations for Outpatient Follow-up:  1. Pt will need to follow up with PCP in 1-2 weeks post discharge 2. Please obtain BMP to evaluate electrolytes and kidney function 3. Please also check CBC to evaluate Hg and Hct levels 4. Pt to continue Augmentin upon discharge for 7 more days   Discharge Diagnoses:  Principal Problem:   Sepsis ([place]) Active Problems:   Chronic obstructive pulmonary disease ([place])   Acute hypoxemic respiratory failure (HCC)   Acute renal failure (ARF) (HCC)   Hyponatremia   Hyperkalemia   Chest tube in place   Primary spontaneous pneumothorax   S/P lobectomy of lung  Discharge Condition: Stable  Diet recommendation: Heart healthy diet discussed in details   Brief Narrative   66 y.o.femalewith a PMH of COPD, chronic respiratory failure on home oxygen who was admitted 11/05/16 for a right lower lobe necrotizing community-acquired pneumonia complicated by pneumothorax and empyema status post right facets and right lower lobectomy on 11/15/16. She was intubated from 11/07/16 arrow forward 11/20/16 and under the care of the critical care team. She also required pressor support, which was weaned 11/09/16. Care transferred to [facility].   SIGNIFICANT EVENTS:  3/18 Admitted 3/20 transfer to ICU and intubated 3/21 refractory shock 3/22 Right PTX > chest tube placed. Pressors stopped 3/28 VATS, right mini thoracotomy, drainage of empyema, decortication, right lower lobectomy. 4/02 Extubated 4/03 Re-admitted to ICU for acute hypercarbic respiratory failure. Improved on BiPAP. 4/07 PCCM asked to see for desatn/mucus plug   Subjective:   Pt reports feeling better and ready to be discharged to SNF today.   Assessment  & Plan :   Acute on chronic hypoxemic hypercarbic respiratory  failure Right lower lobe necrotizing pneumonia Right empyema status post VATS decortication and right lower lobe lobectomy  Right Pleural Effusion vs mucous plug  - CTS reviewed CXR and CT from 4/8, thought to be primarily atelectasis/ consolidation with a small amount of fluid, which would be expected after a lobecomtomy - IR attempted thoracentesis but unable to perform on 4/9 as not enough fluid to aspirate - CXR 4/10 with notable for an improvement in the appearance of the right lung and with re expansion noted throughout - pt is also clinically better, ambulated over the weekend and doing well  - per PCCM, Continue chest flutter valve, mucomyst - Continue Duonebs as needed  - No role for FOB at this time  - continued vancomycin, cefepime day #8 (started on 11/25/2016) - appreciate PCCM and CTS teams input  - ABX changed to PO on discharge and to complete for 7 more days post op - per PCCM, ok to stop ABX once pt back to her baseline, clinically improved, no fevers - if pt starts to deteriorate upon completion of ABX, may need to be reassessed for continuation of ABX per PCCM   Sepsis/septic shock secondary to necrotizing right lower lung pneumonia/empyema (admission 3/18), strep + - Vanco/Rocephin/zithromax 3/18=>3/21 - Vanco 3/28=>3/29 - Unasyn 3/21=>4/7 - Chest tube 3/22 secondary to right pneumothorax - R VATS, R minithoracotomy with drainage of empyema, decortication, right lower lobectomy 3/28 - Intubated 3/28, Extubated 4/2 - Sputum 3/18 negative - Blood cx 3/18 negative - Respiratory virus panel negative - Urine strep antigen positive - 2-4 week course of unasyn recommended by critical care team but due to Acute respiratory failure 4/7, started  Vanco /cefepime 4/7=> will change to oral Augmentin upon discharge as noted above, per PCCM ok to stop ABX once pt clinically back to her baseline - I suspect we can continue Augmentin for 7 more days post discharge and if pt remains  afebrile and clinically stable, no need for continued ABX  Copd w exacerbation - Currently on prednisone taper, Duoneb, Pulmicort, Nacetylcysteine - overall stable and continues to improve   Acute renal failure secondary to nsaid and ace inhibitor - resolved   Hyponatremia  - overall stable  Normocytic anemia - Hg is down a bit since admission but no signs of active bleeding  Hyperlipidemia - Cont lipitor  Hypertension - Cont metoprolol - reasonable inpatient control   Steroid induced hyperglycemia - Cont ssi  Thrombocytosis  - From infection - improving  Code Status : Full code  Family Communication  : w patient, no family at bedside  Disposition Plan  :  SNF today   Consults  :  pccm, CT surgery  Procedures  :  Chest tube 3/22 secondary to right pneumothorax  2-D echo 11/08/16 estimated ejection fraction was in the range of 65% to 70%. grade 1 DD  11/15/16 PROCEDURE:  BRONCHOSCOPY RIGHT VATS/ MINI THORRACOTOMY DRAINAGE OF EMPYEMA AND DECORTICATION RIGHT LOWER LOBECTOMY  DVT Prophylaxis     Lovenox -  SCDs   Cultures 3/20 Tracheal aspirate >> NGTD x 2 days 3/21 Respiratory viral panel negative 3/21 Blood cultures x 2 >> NGTD x 5 days 3/28 R pleural fluid NGTD x 5 days  Antibiotics  Vanco/Rocephin/zithromax 3/18 => 3/21 Vanco 3/28 => 3/29 Unasyn 3/21 =>4 /7 Vancomycin 4/7 => 4/15 Cefepime 4/7 => 4/15 Augmentin 4/15 =>  Procedures/Studies: Dg Chest 2 View  Result Date: 11/30/2016 CLINICAL DATA:  Empyema. Hx of COPD, empyema, hypertension, and spontaneous pneumothorax. Pt had a video assisted thoracoscopy and mini thoracotomy with drainage of empyema in the right lower lobe of right lung in march 2018. EXAM: CHEST - 2 VIEW COMPARISON:  11/28/2016 FINDINGS: Small right pleural effusion as before. There is adjacent subsegmental atelectasis or consolidation at the right lung base. Heart size and mediastinal contours are within normal  limits. No pneumothorax. Osteopenia. IMPRESSION: Persistent small right pleural effusion and adjacent right lower lobe consolidation/atelectasis. Electronically Signed   By: Lucrezia Europe M.D.   On: 11/30/2016 07:59   Dg Chest 2 View  Result Date: 11/28/2016 CLINICAL DATA:  Persistent cough and chest congestion. Admitted 3 weeks ago with right-sided pneumonia and atelectasis. Persistent cough and chest congestion EXAM: CHEST  2 VIEW COMPARISON:  Chest x-ray and chest CT scan of April 8th 2018 FINDINGS: There has been marked improvement in the appearance of the right lung with re-expansion noted throughout. A small right pleural effusion persists. There is a trace of pleural fluid on the left. The left lung is well-expanded and clear. The cardiac silhouette is top-normal in size. The pulmonary vascularity is normal. There is calcification in the wall of the aortic arch. The trachea is midline. IMPRESSION: Marked improvement in the appearance of the right lung. Right lower lobe infiltrate and small pleural effusion persist. A trace left pleural effusion is present. Thoracic aortic atherosclerosis. Electronically Signed   By: David  Martinique M.D.   On: 11/28/2016 09:30   Dg Chest 2 View  Result Date: 11/24/2016 CLINICAL DATA:  Recent right lower lobectomy for empyema. Currently with cough EXAM: CHEST  2 VIEW COMPARISON:  November 23, 2016 FINDINGS: There has been partial clearing of  airspace consolidation from the lung bases compared to 1 day prior. There remains patchy bibasilar atelectasis with small pleural effusions bilaterally. Lungs elsewhere are clear. Note that there is volume loss on the right consistent with previous surgery. Heart size is normal. The pulmonary vascular is within normal limits. No adenopathy. No bone lesions. IMPRESSION: Bibasilar atelectasis with small pleural effusions. No consolidation. Stable volume loss on the right. No new opacity. Stable cardiac silhouette. No evident pneumothorax.  Electronically Signed   By: Lowella Grip III M.D.   On: 11/24/2016 08:38   Dg Chest 2 View  Result Date: 11/05/2016 CLINICAL DATA:  Shortness of breath and cough. EXAM: CHEST  2 VIEW COMPARISON:  August 28, 2015 FINDINGS: The cardiomediastinal silhouette is stable. No pneumothorax. The left lung is clear. New effusion and underlying opacity seen on the right. IMPRESSION: New effusion and underlying opacity seen in the right lower lobe. No other interval changes. Recommend follow-up to resolution. Electronically Signed   By: Dorise Bullion III M.D   On: 11/05/2016 19:03   Ct Chest Wo Contrast  Result Date: 11/26/2016 CLINICAL DATA:  Right lower lobe surgery for necrotizing pneumonia 11/15/2016. Became short of breath last night with hypoxia. EXAM: CT CHEST WITHOUT CONTRAST TECHNIQUE: Multidetector CT imaging of the chest was performed following the standard protocol without IV contrast. COMPARISON:  CT 11/13/2016, 02/19/2015 and 10/13/2005 as well as recent chest x-ray 11/26/2016. FINDINGS: Cardiovascular: Interval removal of central venous catheter. Mild cardiomegaly with minimal cardiomediastinal shift to the right compatible recent right lower lobectomy. Minimal calcified plaque over the left main and left anterior descending coronary arteries. Mild calcified plaque over the thoracic aorta. Mediastinum/Nodes: 1 cm precarinal lymph node without significant change likely reactive. No hilar adenopathy. Remaining mediastinal structures are unremarkable. Lungs/Pleura: Interval removal of endotracheal tube. Subtle patchy density over the left apex improved. Mild left basilar atelectasis. Surgical sutures over the posteromedial right lower thorax compatible with right lower lobectomy. Moderate-size right pleural effusion. Heterogeneous consolidation over the right mid to upper lung. No pneumothorax. Central right-sided bronchi not well aerated. Upper Abdomen: Unchanged. Musculoskeletal: Mild degenerative  change of the spine. IMPRESSION: Postsurgical change compatible recent right lower lobectomy with volume loss of the right lung and cardiomediastinal shift to the right. Moderate-size right effusion tracking to the apex. Heterogeneous consolidation over the right mid to upper lung with decreased aeration of the central bronchi which may be due to persistent infection. Patchy opacification over the left apex improved. Left basilar atelectasis. Mild cardiomegaly. Mild atherosclerotic coronary artery disease. Aortic atherosclerosis. Electronically Signed   By: Marin Olp M.D.   On: 11/26/2016 17:04   Ct Chest Wo Contrast  Result Date: 11/13/2016 CLINICAL DATA:  66 year old female with history of loculated pleural effusion with chest tube in place. EXAM: CT CHEST WITHOUT CONTRAST TECHNIQUE: Multidetector CT imaging of the chest was performed following the standard protocol without IV contrast. COMPARISON:  Chest CT 02/19/2015. Multiple recent prior chest radiographs. FINDINGS: Cardiovascular: Heart size is normal. There is no significant pericardial fluid, thickening or pericardial calcification. There is aortic atherosclerosis, as well as atherosclerosis of the great vessels of the mediastinum and the coronary arteries, including calcified atherosclerotic plaque in the left main and left anterior descending coronary arteries. Left internal jugular central venous catheter with tip terminating in the superior aspect of the right atrium. Mediastinum/Nodes: Patient is intubated, with the tip of the endotracheal to the lying approximately 2.1 cm above the carina. Nasogastric tube extends into the stomach. Numerous  borderline enlarged and mildly enlarged mediastinal and bilateral hilar lymph nodes, measuring up to 11 mm in short axis in the subcarinal nodal station. Esophagus is unremarkable in appearance. No axillary lymphadenopathy. Lungs/Pleura: Right-sided chest tube in position with tip directed into the medial  aspect of the apex of the right hemithorax. Large complex right-sided pleural fluid and gas collection. Fluid ranges from low to intermediate attenuation, suggesting proteinaceous contents. Some of the pneumothorax component is confluent anteriorly, while other locules of gas are noted throughout the lower right hemithorax, some of which appear to be within collapsed/consolidated lung tissue, but other small locules of gas likely are present within loculated pleural fluid in the lower right hemithorax. In the medial aspect of the lower right hemithorax there is what appears to be a thick-walled cavitary area, which is favored to be within the lung parenchyma rather than in the pleural space, measuring up to 6.4 x 2.4 x 6.5 cm (axial image 83 of series 201, and coronal image 56 of series 203). Areas of bronchiectasis are noted within the right lower lobe. Within the aerated portions of the lungs there is patchy multifocal ground-glass attenuation and septal thickening. The ground-glass attenuation is most evident in a peribronchovascular distribution, likely infectious/inflammatory in etiology, most severe in the left upper lobe. No significant left pleural effusion. Upper Abdomen: Unremarkable. Musculoskeletal: Extensive emphysema is noted throughout the subcutaneous fat of the chest wall bilaterally extending up into the cervical regions bilaterally (right greater than left). There are no aggressive appearing lytic or blastic lesions noted in the visualized portions of the skeleton. IMPRESSION: 1. Findings are most compatible with right lower lobe necrotizing pneumonia, with what appears to be a large right lower lobe cavity in the medial aspect of the basal segments of the right lower lobe, as well as a large right empyema. In this complex right pleural gas and fluid collections there both a small anterior pneumothorax, as well as multifocal loculated components of gas within the inferior aspect of the right  pleural space, as above. Right-sided chest tube is directed into the medial aspect of the right apex, and the tip of the chest tube is currently surrounded by very little pleural fluid or gas. 2. Multifocal patchy interstitial and airspace disease asymmetrically distributed throughout the remaining aerated portions of the lungs, favored to reflect severe multilobar bronchopneumonia. 3. Aortic atherosclerosis, in addition to left main and left anterior descending coronary artery disease. Please note that although the presence of coronary artery calcium documents the presence of coronary artery disease, the severity of this disease and any potential stenosis cannot be assessed on this non-gated CT examination. Assessment for potential risk factor modification, dietary therapy or pharmacologic therapy may be warranted, if clinically indicated. 4. Support apparatus, as above. Electronically Signed   By: Vinnie Langton M.D.   On: 11/13/2016 15:19   US Renal  Result Date: 11/06/2016 CLINICAL DATA:  Sepsis EXAM: RENAL / URINARY TRACT ULTRASOUND COMPLETE COMPARISON:  None. FINDINGS: Right Kidney: Length: 9.4 cm, within normal limits. Echogenicity within normal limits. No mass or hydronephrosis visualized. Left Kidney: Length: 10.9 cm, within normal limits. Echogenicity within normal limits. No mass or hydronephrosis visualized. Bladder: Appears normal for degree of bladder distention. IMPRESSION: Negative bilateral renal ultrasound. Electronically Signed   By: San Morelle M.D.   On: 11/06/2016 12:41   Dg Chest Port 1 View  Result Date: 12/04/2016 CLINICAL DATA:  66 year old female post right lung surgery. Hypertension. COPD. Subsequent encounter. EXAM: PORTABLE CHEST 1  VIEW COMPARISON:  11/30/2016 chest x-ray.  11/26/2016 chest CT. FINDINGS: Post right lower lobectomy. Minimal basilar atelectatic changes and very small right-sided pleural effusion. No pneumothorax. Heart size top-normal. Calcified aorta.  Acromioclavicular joint degenerative changes. IMPRESSION: Post right lower lobectomy. Minimal basilar atelectatic changes. Very small right-sided pleural effusion. Electronically Signed   By: Genia Del M.D.   On: 12/04/2016 07:04   Dg Chest Port 1 View  Result Date: 11/26/2016 CLINICAL DATA:  Respiratory failure. Hx of COPD, partial right lung removed(11/20/16), hypertension, primary spontaneous pneumothorax. Nonsmoker, 2nd hand smoke exposure. EXAM: PORTABLE CHEST 1 VIEW COMPARISON:  Chest x-rays dated 11/25/2016 and 11/24/2016. FINDINGS: There is now complete opacification the right hemithorax, presumably large pleural effusion and/or airspace collapse, favor large pleural effusion given the stable position of the mediastinum. There is stable volume loss on the right that is compatible with history of previous right lower lobe resection. Left lung is clear. Osseous structures about the chest are unremarkable. IMPRESSION: Complete opacification of the right hemithorax, significantly worsened compared to yesterday's exam, favor large pleural effusion. These results will be called to the ordering clinician or representative by the Radiologist Assistant, and communication documented in the PACS or zVision Dashboard. Electronically Signed   By: Franki Cabot M.D.   On: 11/26/2016 07:33   Dg Chest Port 1 View  Result Date: 11/25/2016 CLINICAL DATA:  Concern for spontaneous pneumothorax. Personal history of spontaneous pneumothorax. Shortness of breath, acute onset. Initial encounter. EXAM: PORTABLE CHEST 1 VIEW COMPARISON:  Chest radiograph performed earlier today at 6:43 a.m. FINDINGS: A small right pleural effusion is suspected, with associated airspace consolidation. This raises concern for pneumonia, new from the prior study. Minimal left basilar opacity likely reflects atelectasis or scarring, stable from the prior study. No pneumothorax is seen. The patient is status post right lower lobectomy. Underlying  chronic right pleural thickening is noted. The cardiomediastinal silhouette is mildly enlarged. No acute osseous abnormalities are identified. IMPRESSION: 1. Small right pleural effusion suspected, with associated airspace consolidation. This raises concern for pneumonia, new from the prior study. Minimal left basilar airspace opacity likely reflects atelectasis or scarring. 2. No evidence of pneumothorax. 3. Mild cardiomegaly. Electronically Signed   By: Garald Balding M.D.   On: 11/25/2016 17:46   Dg Chest Port 1 View  Result Date: 11/25/2016 CLINICAL DATA:  Respiratory failure EXAM: PORTABLE CHEST 1 VIEW COMPARISON:  Yesterday FINDINGS: Streaky densities at the bases, right more than left. Asymmetric elevation of the right diaphragm, increased. There is right pleural thickening reaching the apex. Borderline cardiomegaly, accentuated by technique. Small left effusions based on lateral film yesterday. No Kerley lines or pneumothorax. IMPRESSION: 1. Mildly increased atelectasis at the right base. 2. Right pleural thickening and lower lobectomy. Electronically Signed   By: Monte Fantasia M.D.   On: 11/25/2016 08:32   Dg Chest Port 1 View  Result Date: 11/23/2016 CLINICAL DATA:  CHEST TUBE REMOVAL/RIGHT EXAM: PORTABLE CHEST 1 VIEW COMPARISON:  11/23/2016 FINDINGS: Right chest tube has been removed. No pneumothorax. Heart size is accentuated by the AP portable technique. There are patchy densities in the lung bases bilaterally which partially obscure the hemidiaphragms, increased on the left. Opacity at the left lung base now obscures the hemidiaphragm. No pulmonary edema. IMPRESSION: Bilateral lower lobe opacities, increased on the left. No pneumothorax following removal of chest tube. Electronically Signed   By: Nolon Nations M.D.   On: 11/23/2016 12:53   Dg Chest Port 1 View  Result Date: 11/23/2016  CLINICAL DATA:  Shortness of breath.  Pleural effusion . EXAM: PORTABLE CHEST 1 VIEW COMPARISON:   11/22/2016.  11/21/2016 . FINDINGS: Right chest tube in stable position. No pneumothorax. Stable cardiomegaly. Interim improvement of pulmonary interstitial prominence suggesting improvement of pulmonary interstitial edema. Tiny bilateral pleural effusions. Stable biapical pleural thickening most likely secondary scarring . Prominent skin folds noted over the chest. IMPRESSION: 1. Right chest tube in stable position.  No pneumothorax. 2. Stable cardiomegaly. Interim improvement of pulmonary interstitial prominence suggesting improving interstitial edema. Tiny bilateral pleural effusions again noted. Electronically Signed   By: Marcello Moores  Register   On: 11/23/2016 07:34   Dg Chest Port 1 View  Result Date: 11/22/2016 CLINICAL DATA:  66 y/o  F; increasing shortness of breath. EXAM: PORTABLE CHEST 1 VIEW COMPARISON:  11/21/2016 chest radiograph. FINDINGS: Stable cardiac silhouette given projection and technique. Aortic atherosclerosis with calcification. Stable position of right-sided chest tube. Increase interstitial opacities in the lungs bilaterally probably represents developing interstitial pulmonary edema. Increasing opacity of the right lung base probably represents worsening pleural effusion and atelectasis. No acute osseous abnormality identified. IMPRESSION: Increase interstitial opacities probably representing developing edema. Increasing opacity of the right lung and right lung base, likely increasing pleural effusion. No appreciable pneumothorax. Electronically Signed   By: Kristine Garbe M.D.   On: 11/22/2016 05:29   Dg Chest Port 1 View  Result Date: 11/21/2016 CLINICAL DATA:  History of pneumothorax.  Chest tube removal. EXAM: PORTABLE CHEST 1 VIEW COMPARISON:  11/20/2016. FINDINGS: Interim extubation. Interim removal of the lateral most chest tube. Two medial most chest tube is in stable position. Left IJ line in stable position . Heart size stable. Right base subsegmental atelectasis.  Perihilar atelectasis/infiltrate. Small bilateral pleural effusions. No pneumothorax. IMPRESSION: 1. Interim extubation. Interim removal of lateral most chest tube. Two medial most chest tubes in stable position. Left IJ line stable position. No pneumothorax. 2. Persistent right base subsegmental atelectasis. Mild left perihilar atelectasis/ infiltrate noted on today's exam. Small bilateral pleural effusions. Electronically Signed   By: Marcello Moores  Register   On: 11/21/2016 06:45   Dg Chest Port 1 View  Result Date: 11/20/2016 CLINICAL DATA:  Status post thoracoscopy and empyema drainage with decortication on the right. EXAM: PORTABLE CHEST 1 VIEW COMPARISON:  Portable chest x-ray of November 19, 2016 FINDINGS: There remains mild volume loss on the right. There is no pneumothorax. The right-sided chest tube tip projects over the posterior aspect of the fourth rib. A second more medially positioned chest tube has its tip approximately 2 cm lateral to the hands. A third chest tube has its tip projecting over the medial aspect of the ninth rib. The left lung is well-expanded. Minimal basilar atelectasis is suspected on the left. The cardiac silhouette is top-normal in size. The pulmonary vascularity is not engorged. The endotracheal tube tip lies approximately 3.7 cm above the carina. The esophagogastric tube tip projects below the inferior margin of the image. The left internal jugular venous catheter tip projects over the distal third of the SVC. IMPRESSION: Fairly stable appearance of the chest. No right-sided pneumothorax or significant pleural effusion. Minimal postsurgical volume loss due to lower lobectomy. Minimal left basilar atelectasis. The support tubes are in reasonable position. Electronically Signed   By: David  Martinique M.D.   On: 11/20/2016 07:08   Dg Chest Port 1 View  Result Date: 11/19/2016 CLINICAL DATA:  Pneumonia EXAM: PORTABLE CHEST 1 VIEW COMPARISON:  11/18/2016 FINDINGS: Three chest tubes on the  right  unchanged. No pneumothorax. Minimal right effusion unchanged. Mild bibasilar airspace disease unchanged Endotracheal tube 2.5 cm above the carina. Left jugular central venous catheter tip in the right atrium. NG tube enters the stomach. IMPRESSION: No change from yesterday. Electronically Signed   By: Franchot Gallo M.D.   On: 11/19/2016 07:07   Dg Chest Port 1 View  Result Date: 11/18/2016 CLINICAL DATA:  Evaluate ETT. EXAM: PORTABLE CHEST 1 VIEW COMPARISON:  November 17, 2016 FINDINGS: The ETT is in good position. Right-sided chest tubes remain, in good position. The enteric tube terminates below today's study but the side port is below the GE junction within the stomach. No pneumothorax. The cardiomediastinal silhouette is stable. Small bilateral pleural effusions with underlying atelectasis, unchanged. A left-sided central line terminates near the caval atrial junction. It is possible it could extend into the right atrium. This appears to be unchanged in the interval. No overt edema. IMPRESSION: 1. Stable support apparatus. The distal tip of the right central line is near the caval atrial junction, either within the distal SVC or just within the right atrium. 2. Small bilateral effusions with underlying atelectasis remain. Electronically Signed   By: Dorise Bullion III M.D   On: 11/18/2016 07:26   Dg Chest Port 1 View  Result Date: 11/17/2016 CLINICAL DATA:  Chest 2.  Change in respirations and breath sounds EXAM: PORTABLE CHEST 1 VIEW COMPARISON:  11/17/2016 FINDINGS: Right chest tube, endotracheal tube, NG tube and left central line remain in place, unchanged. Bibasilar atelectasis is stable. No pneumothorax. Probable small effusions. IMPRESSION: No pneumothorax. Continued bibasilar atelectasis and small effusions. Electronically Signed   By: Rolm Baptise M.D.   On: 11/17/2016 12:57   Dg Chest Port 1 View  Result Date: 11/17/2016 CLINICAL DATA:  Respiratory failure EXAM: PORTABLE CHEST 1 VIEW  COMPARISON:  11/16/2016 FINDINGS: Endotracheal tube, nasogastric catheter and left jugular central line are again seen and stable. Chest tubes are noted on the right and stable. No pneumothorax is seen. Bibasilar atelectatic changes are seen. No bony abnormality is noted. IMPRESSION: Tubes and lines as described.  No pneumothorax is noted. Stable bibasilar changes. Electronically Signed   By: Inez Catalina M.D.   On: 11/17/2016 07:32   Dg Chest Port 1 View  Result Date: 11/16/2016 CLINICAL DATA:  Acute respiratory failure EXAM: PORTABLE CHEST 1 VIEW COMPARISON:  11/15/2016 FINDINGS: Cardiac shadow remains mildly enlarged. Endotracheal tube, nasogastric catheter and left jugular central line are again seen. Multiple right chest tubes are again noted and stable. No definitive pneumothorax is seen. Bibasilar infiltrative changes are noted right greater than left. The changes on the left has increased slightly in the interval from the prior exam. IMPRESSION: Postsurgical changes. No pneumothorax is seen. Bibasilar infiltrative changes are seen. Electronically Signed   By: Inez Catalina M.D.   On: 11/16/2016 07:34   Dg Chest Port 1 View  Result Date: 11/15/2016 CLINICAL DATA:  Right lower lobe lobectomy for pulmonary abscess/empyema EXAM: PORTABLE CHEST 1 VIEW COMPARISON:  11/15/2016 FINDINGS: The heart size and mediastinal contours are within normal limits. Endotracheal tube tip is slightly low lying at 2.4 cm above the carina. Pullback approximately 1 cm recommended. Left IJ central line catheter is noted at the cavoatrial junction. Two right-sided chest tubes are identified projecting over the lung apex with decrease in previously noted loculated pleural opacity along the periphery of the right hemithorax. Bibasilar atelectasis is identified. No significant pneumothorax. Gastric tube extends below the left hemidiaphragm into the expected  location the stomach. The tip is excluded however. No acute osseous  abnormality appear IMPRESSION: 1. Slightly low-lying endotracheal tube tip approximately 2.4 cm above the carina. Pullback approximately 1 cm recommended. Otherwise, satisfactory support line and tube positions. These results will be called to the ordering clinician or representative by the Radiologist Assistant, and communication documented in the PACS or zVision Dashboard. 2. Residual atelectasis at the right lung base without appreciable pneumothorax status post right lower lobectomy. Electronically Signed   By: Ashley Royalty M.D.   On: 11/15/2016 19:57   Dg Chest Port 1 View  Result Date: 11/15/2016 CLINICAL DATA:  Respiratory failure. EXAM: PORTABLE CHEST 1 VIEW COMPARISON:  11/14/2016 CT 11/13/2016 . FINDINGS: Endotracheal tube, NG tube, left IJ line, right chest tube in stable position. No definite pneumothorax identified. Previously identified anterior pneumothorax best identified on CT of 11/13/2016. Persistent basilar infiltrates, particularly prominent on the right. Persistent prominent right sided pleural effusions/possible empyema again noted. No change. IMPRESSION: 1. Lines and tubes including right chest tube in stable position. No pneumothorax identified. Previously identified anterior pneumothorax best demonstrated by prior CT of 11/13/2016. 2. Persistent loculated right pleural effusion/empyema. No acute change. Persistent basilar infiltrates, particular prominent on the right. No change. Electronically Signed   By: Marcello Moores  Register   On: 11/15/2016 06:46   Dg Chest Port 1 View  Result Date: 11/14/2016 CLINICAL DATA:  Respiratory failure. EXAM: PORTABLE CHEST 1 VIEW COMPARISON:  CT 11/13/2016.  Chest x-ray 11/13/2016. FINDINGS: Endotracheal tube, NG tube, left IJ line, right chest tube stable position. Heart size stable. Bilateral pulmonary infiltrates with right side pleural effusion/ possible empyema again noted. Previously identified anterior pneumothorax is best demonstrated by CT of  11/13/2016. Mild chest wall subcutaneous emphysema again noted. IMPRESSION: 1. Lines and tubes including right chest tube in stable position. 2. Persistent bibasilar infiltrates. Persistent right sided prominent pleural effusion/ possible empyema again noted without significant change. Previously identified anterior pneumothorax best demonstrated about recent CT. No definite pneumothorax noted on this chest x-ray. Electronically Signed   By: Marcello Moores  Register   On: 11/14/2016 07:04   Dg Chest Port 1 View  Result Date: 11/13/2016 CLINICAL DATA:  Pneumothorax.  Shortness of breath . EXAM: PORTABLE CHEST 1 VIEW COMPARISON:  11/12/2016. FINDINGS: Endotracheal tube, left IJ line, NG tube, right chest tube in stable position. No pneumothorax. Persistent right side pleural effusion with increase in size from prior exam. Right base atelectasis. Heart size normal. Subcutaneous emphysema has improved. IMPRESSION: 1. Lines and tubes is including right chest tube in stable position. No pneumothorax. 2.  Interim increase in right pleural effusion. Electronically Signed   By: Marcello Moores  Register   On: 11/13/2016 06:47   Dg Chest Port 1 View  Result Date: 11/12/2016 CLINICAL DATA:  Patient with history of pneumothorax. EXAM: PORTABLE CHEST 1 VIEW COMPARISON:  Chest radiograph 11/11/2016 FINDINGS: ET tube terminates in the mid trachea. Central venous catheter tip projects over the right atrium. Monitoring leads overlie the patient. Right-sided chest tube projects over the right lung apex. Stable cardiac and mediastinal contours. Persistent heterogeneous opacities right mid lower lung. Heterogeneous opacities left lung base. Moderate right pleural effusion. No definite pneumothorax. Subcutaneous emphysema. IMPRESSION: Stable support apparatus. Persistent moderate right pleural effusion with underlying opacities. No visible right-sided pneumothorax. Electronically Signed   By: Lovey Newcomer M.D.   On: 11/12/2016 08:40   Dg Chest  Port 1 View  Result Date: 11/11/2016 CLINICAL DATA:  Continued surveillance pneumothorax. EXAM: PORTABLE CHEST 1  VIEW COMPARISON:  11/10/2016. FINDINGS: Unchanged cardiomediastinal silhouette and support apparatus. RIGHT chest tube remains in good position in the RIGHT lung apex. Visualized pneumothorax is less, although RIGHT pleural effusion is increased. BILATERAL pulmonary opacities appear worse. Subcutaneous emphysema redemonstrated. IMPRESSION: No visible pneumothorax.  Increasing RIGHT effusion. Electronically Signed   By: Staci Righter M.D.   On: 11/11/2016 07:51   Dg Chest Port 1 View  Result Date: 11/10/2016 CLINICAL DATA:  Pneumothorax.  Chest tube. EXAM: PORTABLE CHEST 1 VIEW COMPARISON:  Chest x-ray 11/10/2016. FINDINGS: Endotracheal tube, left IJ line, NG tube, right chest tube in stable position. Heart size normal. Bilateral pulmonary infiltrates. Small right pneumothorax noted on today's exam. Diffuse chest wall subcutaneous emphysema again noted. IMPRESSION: 1. Lines and tubes including right chest tube in stable position. Small right pneumothorax noted on today's exam. Diffuse chest wall subcutaneous emphysema noted. 2. Diffuse bilateral pulmonary infiltrates. Critical Value/emergent results were called by telephone at the time of interpretation on 11/10/2016 at 3:17 pm to nurse Anderson Malta, who verbally acknowledged these results. Electronically Signed   By: Marcello Moores  Register   On: 11/10/2016 15:19   Dg Chest Port 1 View  Result Date: 11/10/2016 CLINICAL DATA:  Respiratory failure. EXAM: PORTABLE CHEST 1 VIEW COMPARISON:  11/09/2016. FINDINGS: Endotracheal tube, NG tube, left IJ line in stable position . Right chest tube in stable position. No pneumothorax. Diffuse bilateral chest wall and neck subcutaneous emphysema. Subcutaneous emphysema has increased significantly from prior exam. IMPRESSION: 1. Lines and tubes in stable position. Right chest tube in stable position. No pneumothorax. 2.  Persistent bilateral pulmonary infiltrates, right greater than left. Small right pleural effusion. No interim change. 3. Interim significant progression of diffuse bilateral chest wall and bilateral neck subcutaneous emphysema. Electronically Signed   By: Marcello Moores  Register   On: 11/10/2016 07:13   Dg Chest Port 1 View  Result Date: 11/09/2016 CLINICAL DATA:  Post right chest tube placement EXAM: PORTABLE CHEST 1 VIEW COMPARISON:  11/09/2016 FINDINGS: Cardiomediastinal silhouette is stable. Persistent bilateral basilar infiltrates right greater than left. Small right pleural effusion. There is right chest tube in place with tip in right apex. No definite pneumothorax. Subcutaneous emphysema noted right axilla. Stable endotracheal and NG tube position. Stable left IJ central line position. IMPRESSION: Stable support apparatus. Persistent bilateral basilar infiltrates right greater than left. Small right pleural effusion. There is right chest tube in place with tip in right apex. No definite pneumothorax. Subcutaneous emphysema noted right axilla. Electronically Signed   By: Lahoma Crocker M.D.   On: 11/09/2016 10:27   Dg Chest Port 1 View  Result Date: 11/09/2016 CLINICAL DATA:  Respiratory failure EXAM: PORTABLE CHEST 1 VIEW COMPARISON:  11/08/2016 FINDINGS: Endotracheal tube, nasogastric catheter and left jugular central line are again seen and stable. Cardiac shadow is stable. Bibasilar infiltrates and effusions are seen slightly worse on the right than the left. New left upper lobe infiltrative changes are noted. A new right-sided pneumothorax is noted with approximately 2 cm excursion at the apex at and 1 cm excursion laterally. IMPRESSION: New right pneumothorax. Bibasilar infiltrates as well as new left upper lobe infiltrate are seen. Effusions are seen right greater than left. Critical Value/emergent results were called by telephone at the time of interpretation on 11/09/2016 at 7:04 am to [facility], the pts  nurse, who verbally acknowledged these results and will contact primary physician. Electronically Signed   By: Inez Catalina M.D.   On: 11/09/2016 07:05   Portable Chest Xray  Result Date: 11/08/2016 CLINICAL DATA:  Hypoxia EXAM: PORTABLE CHEST 1 VIEW COMPARISON:  November 07, 2016 FINDINGS: Endotracheal tube tip is 1.5 cm above the carina. Nasogastric tube tip and side port are below the diaphragm. Central catheter tip is at the cavoatrial junction. No pneumothorax. There is airspace consolidation in both lower lobes, not significantly changed from 1 day prior. There are small pleural effusions bilaterally. Heart size and pulmonary vascularity are normal. There is atherosclerotic calcification in the aorta. No adenopathy. No bone lesions. IMPRESSION: Tube and catheter positions as described without evident pneumothorax. Airspace opacity suspicious for pneumonia in both lower lobes. Small pleural effusions bilaterally. No interstitial edema appreciable. Heart size normal. There is aortic atherosclerosis. Electronically Signed   By: Lowella Grip III M.D.   On: 11/08/2016 07:41   Dg Chest Port 1 View  Result Date: 11/07/2016 CLINICAL DATA:  Central line placement. EXAM: PORTABLE CHEST 1 VIEW COMPARISON:  Earlier today at 1412 hours FINDINGS: 1527 hours. Left internal jugular line is difficult to follow centrally secondary to overlying wires and leads. Terminates at least at the level of the high right atrium. Endotracheal and nasogastric tubes are unchanged. Normal heart size. Atherosclerosis in the transverse aorta. Small left pleural effusion is unchanged. No pneumothorax. Bibasilar airspace disease is not significantly changed. Mild interstitial edema. IMPRESSION: Left internal jugular line is difficult to follow centrally secondary to overlying wires and leads. Followed to at least the level of the high right atrium. Consider retraction 2-3 cm with repeat imaging (ideally after removing EKG leads and  wires). Otherwise, similar appearance of the chest with bibasilar airspace disease, left pleural fluid, and interstitial edema. Electronically Signed   By: Abigail Miyamoto M.D.   On: 11/07/2016 15:56   Dg Chest Port 1 View  Result Date: 11/07/2016 CLINICAL DATA:  Endotracheal tube EXAM: PORTABLE CHEST 1 VIEW COMPARISON:  03/09/2017 FINDINGS: Small bilateral pleural effusions. Right lower lobe airspace disease. No pneumothorax. Stable cardiomediastinal silhouette. Endotracheal to with the tip 3.5 cm above the carina. Nasogastric tube coursing below the diaphragm. IMPRESSION: 1. Small bilateral pleural effusions. Right lower lobe airspace disease concerning for pneumonia. 2. Small bilateral pleural effusions. Electronically Signed   By: Kathreen Devoid   On: 11/07/2016 14:29   Dg Chest Port 1 View  Result Date: 11/07/2016 CLINICAL DATA:  Pleural effusion EXAM: PORTABLE CHEST 1 VIEW COMPARISON:  Chest radiograph 11/05/2016 FINDINGS: Unchanged cardiomediastinal contours. Small right pleural effusion and associated basilar consolidation is unchanged. The left lung is clear. IMPRESSION: Unchanged small right pleural effusion and right basilar consolidation. Electronically Signed   By: Ulyses Jarred M.D.   On: 11/07/2016 02:43   Dg Abd Portable 1v  Result Date: 11/07/2016 CLINICAL DATA:  Enteric tube placement EXAM: PORTABLE ABDOMEN - 1 VIEW COMPARISON:  None. FINDINGS: Enteric tube terminates in the distal body of the stomach. No disproportionately dilated small bowel loops. Mild colonic stool volume. No evidence of pneumatosis or pneumoperitoneum. Patchy bibasilar lung opacities. IMPRESSION: 1. Enteric tube terminates in the distal body of the stomach. 2. Nonobstructive bowel gas pattern. 3. Nonspecific patchy bibasilar lung opacities, correlate with chest radiograph. Electronically Signed   By: Ilona Sorrel M.D.   On: 11/07/2016 14:29   Ir US Chest  Result Date: 11/27/2016 CLINICAL DATA:  Patient with recent  VATS and chest tube on the right side. Recent CT scan concerning for possible pleural effusion. Request is made for thoracentesis. EXAM: CHEST ULTRASOUND COMPARISON:  None. FINDINGS: Small area concerning for either  lung consolidation or fluid with sedimentation present within the right inferior chest. IMPRESSION: Small area concerning for either lung consolidation or fluid with sedimentation within the right inferior chest cavity. This was confirmed with Dr. Barbie Banner. Thoracentesis was deferred. If the patient's kidney function is normal and this needs to be further evaluated, he would recommend a CT scan of the chest with contrast as the most recent was without contrast. Read by: Saverio Danker, PA-C Electronically Signed   By: Marybelle Killings M.D.   On: 11/27/2016 13:29   Ir US Chest  Result Date: 11/06/2016 CLINICAL DATA:  Patient admitted with right-sided pneumonia. Recent imaging suggested possible right-sided pleural effusion. Request is made for thoracentesis. EXAM: CHEST ULTRASOUND COMPARISON:  None. FINDINGS: Minimal right-sided effusion. Most of what is seen is consolidation of the right lung secondary to pneumonia. IMPRESSION: Unable to perform thoracentesis secondary to minimal effusion. Read by: Saverio Danker, PA-C Electronically Signed   By: Aletta Edouard M.D.   On: 11/06/2016 11:57     Discharge Exam: Vitals:   12/03/16 2016 12/04/16 0614  BP: (!) 154/55 (!) 135/55  Pulse: 76 67  Resp: 18 18  Temp: 98.7 F (37.1 C) 98.9 F (37.2 C)   Vitals:   12/03/16 2035 12/03/16 2045 12/04/16 0614 12/04/16 0808  BP:   (!) 135/55   Pulse:   67   Resp:   18   Temp:   98.9 F (37.2 C)   TempSrc:   Oral   SpO2: 97% 97% 100% 97%  Weight:   58.7 kg (129 lb 6.4 oz)   Height:        General: Pt is alert, follows commands appropriately, not in acute distress Cardiovascular: Regular rate and rhythm, S1/S2 +, no murmurs, no rubs, no gallops Respiratory: Clear to auscultation bilaterally, no  wheezing, no crackles, no rhonchi Abdominal: Soft, non tender, non distended, bowel sounds +, no guarding  Discharge Instructions  Discharge Instructions    Diet - low sodium heart healthy    Complete by:  As directed    Increase activity slowly    Complete by:  As directed      Allergies as of 12/04/2016      Reactions   No Known Allergies       Medication List    TAKE these medications   acetylcysteine 20 % nebulizer solution Commonly known as:  MUCOMYST Take 4 mLs by nebulization 3 (three) times daily.   albuterol (2.5 MG/3ML) 0.083% nebulizer solution Commonly known as:  PROVENTIL 1 neb three times daily as needed   ALPRAZolam 0.25 MG tablet Commonly known as:  XANAX Take 1-2 tablets (0.25-0.5 mg total) by mouth daily as needed for anxiety.   amoxicillin-clavulanate 875-125 MG tablet Commonly known as:  AUGMENTIN Take 1 tablet by mouth every 12 (twelve) hours.   atorvastatin 20 MG tablet Commonly known as:  LIPITOR Take 20 mg by mouth daily.   budesonide 0.5 MG/2ML nebulizer solution Commonly known as:  PULMICORT Take 2 mLs (0.5 mg total) by nebulization 2 (two) times daily.   COMBIVENT RESPIMAT 20-100 MCG/ACT Aers respimat Generic drug:  Ipratropium-Albuterol Inhale 1 puff into the lungs 4 (four) times daily.   Gerhardt's butt cream Crea Apply 1 application topically as needed for irritation.   lisinopril-hydrochlorothiazide 20-25 MG tablet Commonly known as:  PRINZIDE,ZESTORETIC Take 1 tablet by mouth daily.   metoprolol tartrate 25 MG tablet Commonly known as:  LOPRESSOR Take 1 tablet (25 mg total) by mouth 2 (two)  times daily. What changed:  medication strength  how much to take   oxyCODONE-acetaminophen 5-325 MG tablet Commonly known as:  PERCOCET/ROXICET Take 1 tablet by mouth every 6 (six) hours as needed (pain).   saccharomyces boulardii 250 MG capsule Commonly known as:  FLORASTOR Take 1 capsule (250 mg total) by mouth 2 (two) times  daily.        Contact information for follow-up providers    Marshell Garfinkel, MD Follow up on 01/17/2017.   Specialty:  Pulmonary Disease Why:  Appt at 2:45 PM Contact information: [address] [place] [address] [phone]        Sarah F Groce, NP Follow up on 12/11/2016.   Specialty:  Pulmonary Disease Why:  Appt at 10:00 AM wtih Chest XRAY Contact information: [address] [place] [address] [phone]        Faye Ramsay, MD Follow up.   Specialty:  Internal Medicine Contact information: [address] [place] [address] [phone]            Contact information for after-discharge care    Destination    [facility] Follow up.   Specialty:  [place] information: [place] [phone]                   The results of significant diagnostics from this hospitalization (including imaging, microbiology, ancillary and laboratory) are listed below for reference.     Microbiology: Recent Results (from the past 240 hour(s))  C difficile quick scan w PCR reflex     Status: None   Collection Time: 11/25/16 12:46 PM  Result Value Ref Range Status   C Diff antigen NEGATIVE NEGATIVE Final   C Diff toxin NEGATIVE NEGATIVE Final   C Diff interpretation No C. difficile detected.  Final     Labs: Basic Metabolic Panel:  Recent Labs Lab 11/29/16 0218 11/30/16 0212 12/01/16 0211 12/02/16 0259 12/04/16 0335  NA 134* 134* 133* 133* 133*  K 3.9 3.9 4.1 4.7 4.5  CL 98* 99* 98* 97* 95*  CO2 29 30 30 27 31   GLUCOSE 153* 126* 118* 124* 125*  BUN 12 13 14 15 13   CREATININE 0.61 0.55 0.54 0.55 0.52  CALCIUM 8.2* 7.9* 8.3* 8.2* 8.5*   Liver Function Tests:  Recent Labs Lab 11/27/16 1136  PROT 5.3*   CBC:  Recent Labs Lab 11/29/16 0218 11/30/16 0212 12/01/16 0211 12/02/16 0259 12/04/16 0335   WBC 7.8 8.6 9.3 9.3 9.7  HGB 8.5* 8.3* 8.9* 8.5* 8.6*  HCT 26.2* 26.8* 29.3* 26.9* 26.7*  MCV 88.5 88.4 89.3 88.8 88.4  PLT 397 426* 394 410* 366   CBG:  Recent Labs Lab 12/03/16 0731 12/03/16 1157 12/03/16 1650 12/03/16 2105 12/04/16 0725  GLUCAP 169* 224* 112* 153* 142*   SIGNED: Time coordinating discharge: 30 minutes  Faye Ramsay, MD  [facility] 12/04/2016, 9:35 AM Pager [phone]  If 7PM-7AM, please contact night-coverage [email or web address] Password [email or web address]

## 2016-12-04 NOTE — Clinical Social Work Note (Signed)
CSW facilitated patient discharge including contacting patient family and facility to confirm patient discharge plans. Clinical information faxed to facility and family agreeable with plan. CSW arranged ambulance transport via [facility] to [facility]. RN to call report prior to discharge [phone] Room 104A).  CSW will sign off for now as social work intervention is no longer needed. Please consult [place] again if new needs arise.  [place] Scrape, [place]

## 2016-12-04 NOTE — Discharge Instructions (Signed)

## 2016-12-11 ENCOUNTER — Telehealth: Payer: Self-pay | Admitting: Acute Care

## 2016-12-11 ENCOUNTER — Other Ambulatory Visit: Payer: Self-pay | Admitting: Acute Care

## 2016-12-11 ENCOUNTER — Inpatient Hospital Stay: Payer: Self-pay | Admitting: Acute Care

## 2016-12-11 DIAGNOSIS — Z09 Encounter for follow-up examination after completed treatment for conditions other than malignant neoplasm: Secondary | ICD-10-CM

## 2016-12-11 NOTE — Telephone Encounter (Signed)
Contacted patient to reschedule hospital follow up scheduled today with SG at 10 due to patient not showing. Pt did not answer and a message was not left as mailbox is full. Will try again at later time.

## 2016-12-13 NOTE — Telephone Encounter (Signed)
Contacted patient to try and reschedule hospital follow up with SG. Pt did not answer and voicemail is full. Will try again at later time.

## 2016-12-22 NOTE — Telephone Encounter (Signed)
Attempted to contact patient to schedule HFU. Pt voicemail is full. Will try again at later time.

## 2017-01-11 ENCOUNTER — Telehealth: Payer: Self-pay

## 2017-01-11 NOTE — Telephone Encounter (Signed)
Please disregard this entry and refer to 4/23 phone note.

## 2017-01-11 NOTE — Telephone Encounter (Deleted)
Spoke with patient regarding appointment. HFU was scheduled for 6/6 at 315 with SG. Patient was given address and office contact information. Nothing further is needed.

## 2017-01-11 NOTE — Telephone Encounter (Signed)
Spoke with patient regarding appointment. HFU was scheduled for 6/6 at 315 with SG. Patient was given address and office contact information. Nothing further is needed

## 2017-01-17 ENCOUNTER — Inpatient Hospital Stay: Payer: Self-pay | Admitting: Pulmonary Disease

## 2017-01-24 ENCOUNTER — Ambulatory Visit (INDEPENDENT_AMBULATORY_CARE_PROVIDER_SITE_OTHER)
Admission: RE | Admit: 2017-01-24 | Discharge: 2017-01-24 | Disposition: A | Discharge status: Home / Self Care | Payer: Medicare Other | Source: Ambulatory Visit | Attending: Acute Care | Admitting: Acute Care

## 2017-01-24 ENCOUNTER — Encounter: Payer: Self-pay | Admitting: Acute Care

## 2017-01-24 ENCOUNTER — Ambulatory Visit (INDEPENDENT_AMBULATORY_CARE_PROVIDER_SITE_OTHER): Payer: Medicare Other | Admitting: Acute Care

## 2017-01-24 DIAGNOSIS — Z09 Encounter for follow-up examination after completed treatment for conditions other than malignant neoplasm: Secondary | ICD-10-CM

## 2017-01-24 DIAGNOSIS — J189 Pneumonia, unspecified organism: Secondary | ICD-10-CM | POA: Diagnosis not present

## 2017-01-24 DIAGNOSIS — G4734 Idiopathic sleep related nonobstructive alveolar hypoventilation: Secondary | ICD-10-CM | POA: Diagnosis not present

## 2017-01-24 NOTE — Progress Notes (Signed)
History of Present Illness Diana Gardner is a 66 y.o. female never smoker with COPD on nocturnal oxygen. She is followed by Dr. Vaughan Browner  HPI:  66 year old woman with COPD on home oxygen hospitalized 11/05/2016 through 12/04/2016 for RLL necrotizing CAP complicated by pneumothorax and empyema s/p VATS and lobectomy who developed acute on chronic hypercarbic respiratory failure. She was seen as an inpatient by the CCM service.  Significant events during admission:  LINES / TUBES:  3/20 Radial a-line >> 3/25 3/28 Chest tube >> discontinued  CULTURES: 3/20 Tracheal aspirate >> negative 3/21 Respiratory viral panel negative 3/21 Blood cultures x 2 >> negative 3/28 R pleural fluid >> negative  ANTIBIOTICS: Azithromycin 3/18 > discontinued Ceftriaxone 3/18 >> 3/21 Vanc 3/21 >> 3/21 Unasyn 3/21 > discontinued Cefepime 4/7 > Vanc 4/7 >  SIGNIFICANT EVENTS:  3/18  Admitted 3/20  transfer to ICU and intubated 3/21  refractory shock 3/22  Right PTX > chest tube placed. Pressors stopped 3/28  VATS, right mini thoracotomy, drainage of empyema, decortication, right lower lobectomy. 4/02  Extubated 4/03  Re-admitted to ICU for acute hypercarbic respiratory failure. Improved on BiPAP. 4/07  PCCM asked to see for desatn/mucus plug 4/09  CXR Significantly improved    01/24/2017 Hospital Follow Up: Pt. Presents for hospital follow up. She was hospitalized 11/05/2016 through 12/04/2016. See above for details of admission.She was discharged home on Augmentin which she completed. She states she is doing well.She states her breathing is good.She denies shortness of breath with exertion. She denies any sputum production.She denies any chest pain or fever. She is compliant with  her Duonebs breathing treatments 2-3 times daily and she is compliant with Combivent  inhaler . She uses home oxygen at night at 2 L. she is not wearing her oxygen today in the office. Saturations today after walking  into her exam room were 98%.  Test Results:  Chest x-ray 01/24/2017 The lungs are well aerated bilaterally. Small right-sided pleural effusion is noted with elevation the right hemidiaphragm. No focal confluent infiltrate is seen  CBC Latest Ref Rng & Units 12/04/2016 12/02/2016 12/01/2016  WBC 4.0 - 10.5 K/uL 9.7 9.3 9.3  Hemoglobin 12.0 - 15.0 g/dL 8.6(L) 8.5(L) 8.9(L)  Hematocrit 36.0 - 46.0 % 26.7(L) 26.9(L) 29.3(L)  Platelets 150 - 400 K/uL 366 410(H) 394    BMP Latest Ref Rng & Units 12/04/2016 12/02/2016 12/01/2016  Glucose 65 - 99 mg/dL 125(H) 124(H) 118(H)  BUN 6 - 20 mg/dL 13 15 14   Creatinine 0.44 - 1.00 mg/dL 0.52 0.55 0.54  Sodium 135 - 145 mmol/L 133(L) 133(L) 133(L)  Potassium 3.5 - 5.1 mmol/L 4.5 4.7 4.1  Chloride 101 - 111 mmol/L 95(L) 97(L) 98(L)  CO2 22 - 32 mmol/L 31 27 30   Calcium 8.9 - 10.3 mg/dL 8.5(L) 8.2(L) 8.3(L)     PFT    Component Value Date/Time   FEV1PRE 0.64 01/31/2016 1452   FEV1POST 0.79 01/31/2016 1452   FVCPRE 1.09 01/31/2016 1452   FVCPOST 1.30 01/31/2016 1452   TLC 4.13 01/31/2016 1452   DLCOUNC 14.74 01/31/2016 1452   PREFEV1FVCRT 59 01/31/2016 1452   PSTFEV1FVCRT 60 01/31/2016 1452     Past medical hx Past Medical History:  Diagnosis Date  . COPD (chronic obstructive pulmonary disease) ([place])   . Empyema ([place])   . Hypertension   . Primary spontaneous pneumothorax      Social History  Substance Use Topics  . Smoking status: Never Smoker  . Smokeless tobacco:  Never Used  . Alcohol use 0.0 oz/week     Comment: once in a while she states     Tobacco Cessation: Patient is a never smoker  Past surgical hx, Family hx, Social hx all reviewed.  Current Outpatient Prescriptions on File Prior to Visit  Medication Sig  . acetylcysteine (MUCOMYST) 20 % nebulizer solution Take 4 mLs by nebulization 3 (three) times daily.  Marland Kitchen albuterol (PROVENTIL) (2.5 MG/3ML) 0.083% nebulizer solution 1 neb three times daily as needed  . ALPRAZolam  (XANAX) 0.25 MG tablet Take 1-2 tablets (0.25-0.5 mg total) by mouth daily as needed for anxiety.  Marland Kitchen atorvastatin (LIPITOR) 20 MG tablet Take 20 mg by mouth daily.  . budesonide (PULMICORT) 0.5 MG/2ML nebulizer solution Take 2 mLs (0.5 mg total) by nebulization 2 (two) times daily.  . COMBIVENT RESPIMAT 20-100 MCG/ACT AERS respimat Inhale 1 puff into the lungs 4 (four) times daily.  . Hydrocortisone (GERHARDT'S BUTT CREAM) CREA Apply 1 application topically as needed for irritation.  Marland Kitchen lisinopril-hydrochlorothiazide (PRINZIDE,ZESTORETIC) 20-25 MG tablet Take 1 tablet by mouth daily.  . metoprolol tartrate (LOPRESSOR) 25 MG tablet Take 1 tablet (25 mg total) by mouth 2 (two) times daily.  Marland Kitchen saccharomyces boulardii (FLORASTOR) 250 MG capsule Take 1 capsule (250 mg total) by mouth 2 (two) times daily.   No current facility-administered medications on file prior to visit.      Allergies  Allergen Reactions  . No Known Allergies     Review Of Systems:  Constitutional:   No  weight loss, night sweats,  Fevers, chills, fatigue, or  lassitude.  HEENT:   No headaches,  Difficulty swallowing,  Tooth/dental problems, or  Sore throat,                No sneezing, itching, ear ache, nasal congestion, post nasal drip,   CV:  No chest pain,  Orthopnea, PND, swelling in lower extremities, anasarca, dizziness, palpitations, syncope.   GI  No heartburn, indigestion, abdominal pain, nausea, vomiting, diarrhea, change in bowel habits, loss of appetite, bloody stools.   Resp: Baseline  shortness of breath with exertion less at rest.  Baseline  excess mucus, no productive cough,  No non-productive cough,  No coughing up of blood.  No change in color of mucus.  No wheezing.  No chest wall deformity  Skin: no rash or lesions.  GU: no dysuria, change in color of urine, no urgency or frequency.  No flank pain, no hematuria   MS:  No joint pain or swelling.  No decreased range of motion.  No back  pain.  Psych:  No change in mood or affect. No depression or anxiety.  No memory loss.   Vital Signs BP 128/72 (BP Location: Left Arm, Cuff Size: Normal)   Pulse (!) 52   Resp 18   Ht 5\' 4"  (1.626 m)   Wt 128 lb 9.6 oz (58.3 kg)   SpO2 98%   BMI 22.07 kg/m    Physical Exam:  Body mass index is 22.07 kg/m.   General- No distress,  A&Ox3, pleasant and appears to be doing well ENT: No sinus tenderness, TM clear, pale nasal mucosa, no oral exudate,no post nasal drip, no LAN Cardiac: S1, S2, regular rate and rhythm, no murmur Chest: No wheeze/ rales/ dullness; no accessory muscle use, no nasal flaring, no sternal retractions, diminished in the bases Abd.: Soft Non-tender, nondistended, bowel sounds positive Ext: No clubbing cyanosis, edema Neuro:  normal strength Skin: No rashes, warm  and dry Psych: normal mood and behavior   Assessment/Plan  HCAP (healthcare-associated pneumonia) Resolved pneumonia per chest x-ray 01/24/2017 Small pleural effusion noted on right Right hemidiaphragm elevation Plan We will check a CXR today. We will call you with results. Chest x-ray indicates small right-sided effusion. Follow-up chest x-ray in 6 weeks to ensure resolution Continue your Combivent as you have been doing. Rinse mouth after use Continue neb treatments as you have been doing. Continue bedtime oxtgen as you have been doing. Follow up with Dr. Vaughan Browner  In 3 months Please contact office for sooner follow up if symptoms do not improve or worsen or seek emergency care    Nocturnal hypoxemia Continue wearing oxygen at 2 L nasal cannula at bedtime. Oxygen saturation goals are between 8892%    Magdalen Spatz, NP 01/24/2017  8:30 PM

## 2017-01-24 NOTE — Assessment & Plan Note (Signed)
Continue wearing oxygen at 2 L nasal cannula at bedtime. Oxygen saturation goals are between 8892%

## 2017-01-24 NOTE — Patient Instructions (Addendum)
It is nice to meet you today. We will check a CXR today. We will call you with results. Continue your Combivent as you have been doing. Rinse mouth after use Continue neb treatments as you have been doing. Continue bedtime oxtgen as you have been doing. Follow up with Dr. Vaughan Browner  In 3 months Please contact office for sooner follow up if symptoms do not improve or worsen or seek emergency care

## 2017-01-24 NOTE — Assessment & Plan Note (Signed)
Resolved pneumonia per chest x-ray 01/24/2017 Small pleural effusion noted on right Right hemidiaphragm elevation Plan We will check a CXR today. We will call you with results. Chest x-ray indicates small right-sided effusion. Follow-up chest x-ray in 6 weeks to ensure resolution Continue your Combivent as you have been doing. Rinse mouth after use Continue neb treatments as you have been doing. Continue bedtime oxtgen as you have been doing. Follow up with Dr. Vaughan Browner  In 3 months Please contact office for sooner follow up if symptoms do not improve or worsen or seek emergency care

## 2017-03-29 IMAGING — CR DG CHEST 1V PORT
1 series · 1 of 1 positions shown · non-contrast
Comparison: 11/15/2016

CLINICAL DATA: Acute respiratory failure

EXAM:
PORTABLE CHEST 1 VIEW

[AP]
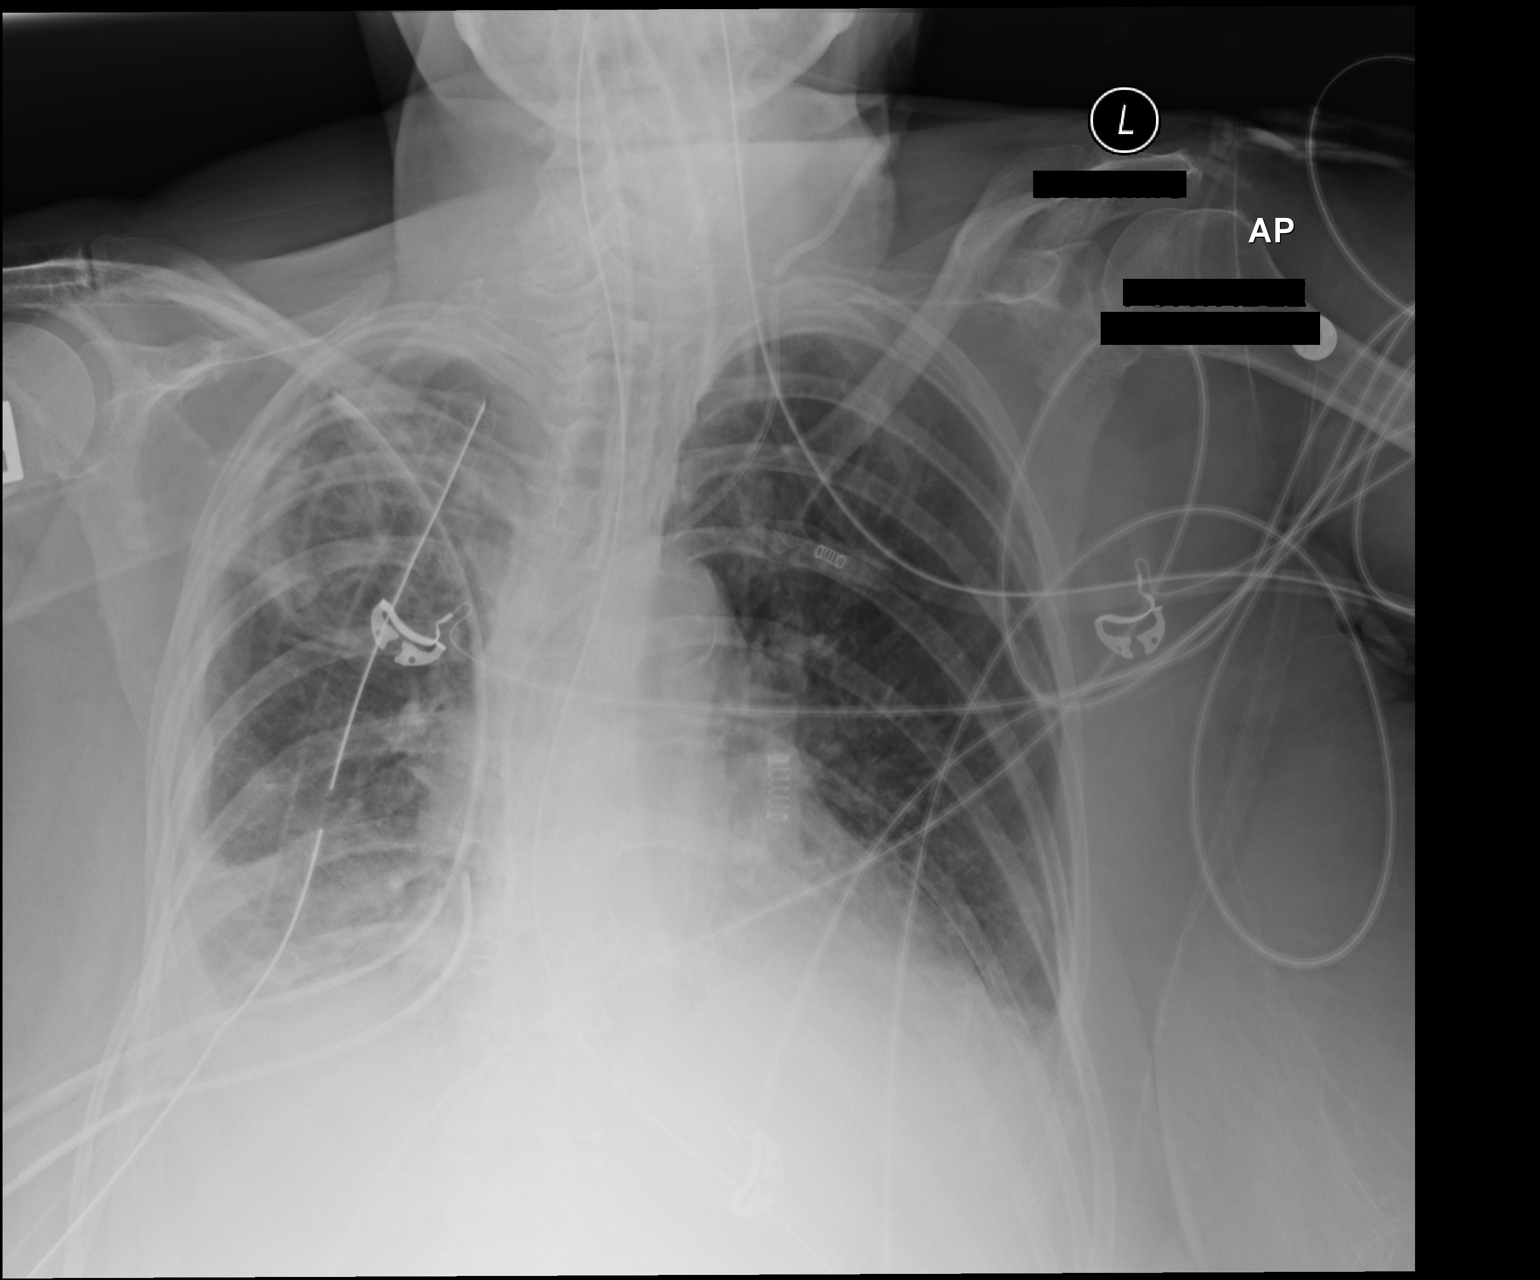

[1 of 1 positions shown; findings below may reference images not displayed]

FINDINGS: Cardiac shadow remains mildly enlarged. Endotracheal tube,
nasogastric catheter and left jugular central line are again seen.
Multiple right chest tubes are again noted and stable. No definitive
pneumothorax is seen. Bibasilar infiltrative changes are noted right
greater than left. The changes on the left has increased slightly in
the interval from the prior exam.
IMPRESSION: Postsurgical changes. No pneumothorax is seen. Bibasilar
infiltrative changes are seen.

## 2017-04-02 IMAGING — CR DG CHEST 1V PORT
1 series · 1 of 1 positions shown · non-contrast
Comparison: Portable chest x-ray November 19, 2016

CLINICAL DATA: Status post thoracoscopy and empyema drainage with
decortication on the right.

EXAM:
PORTABLE CHEST 1 VIEW

[AP]
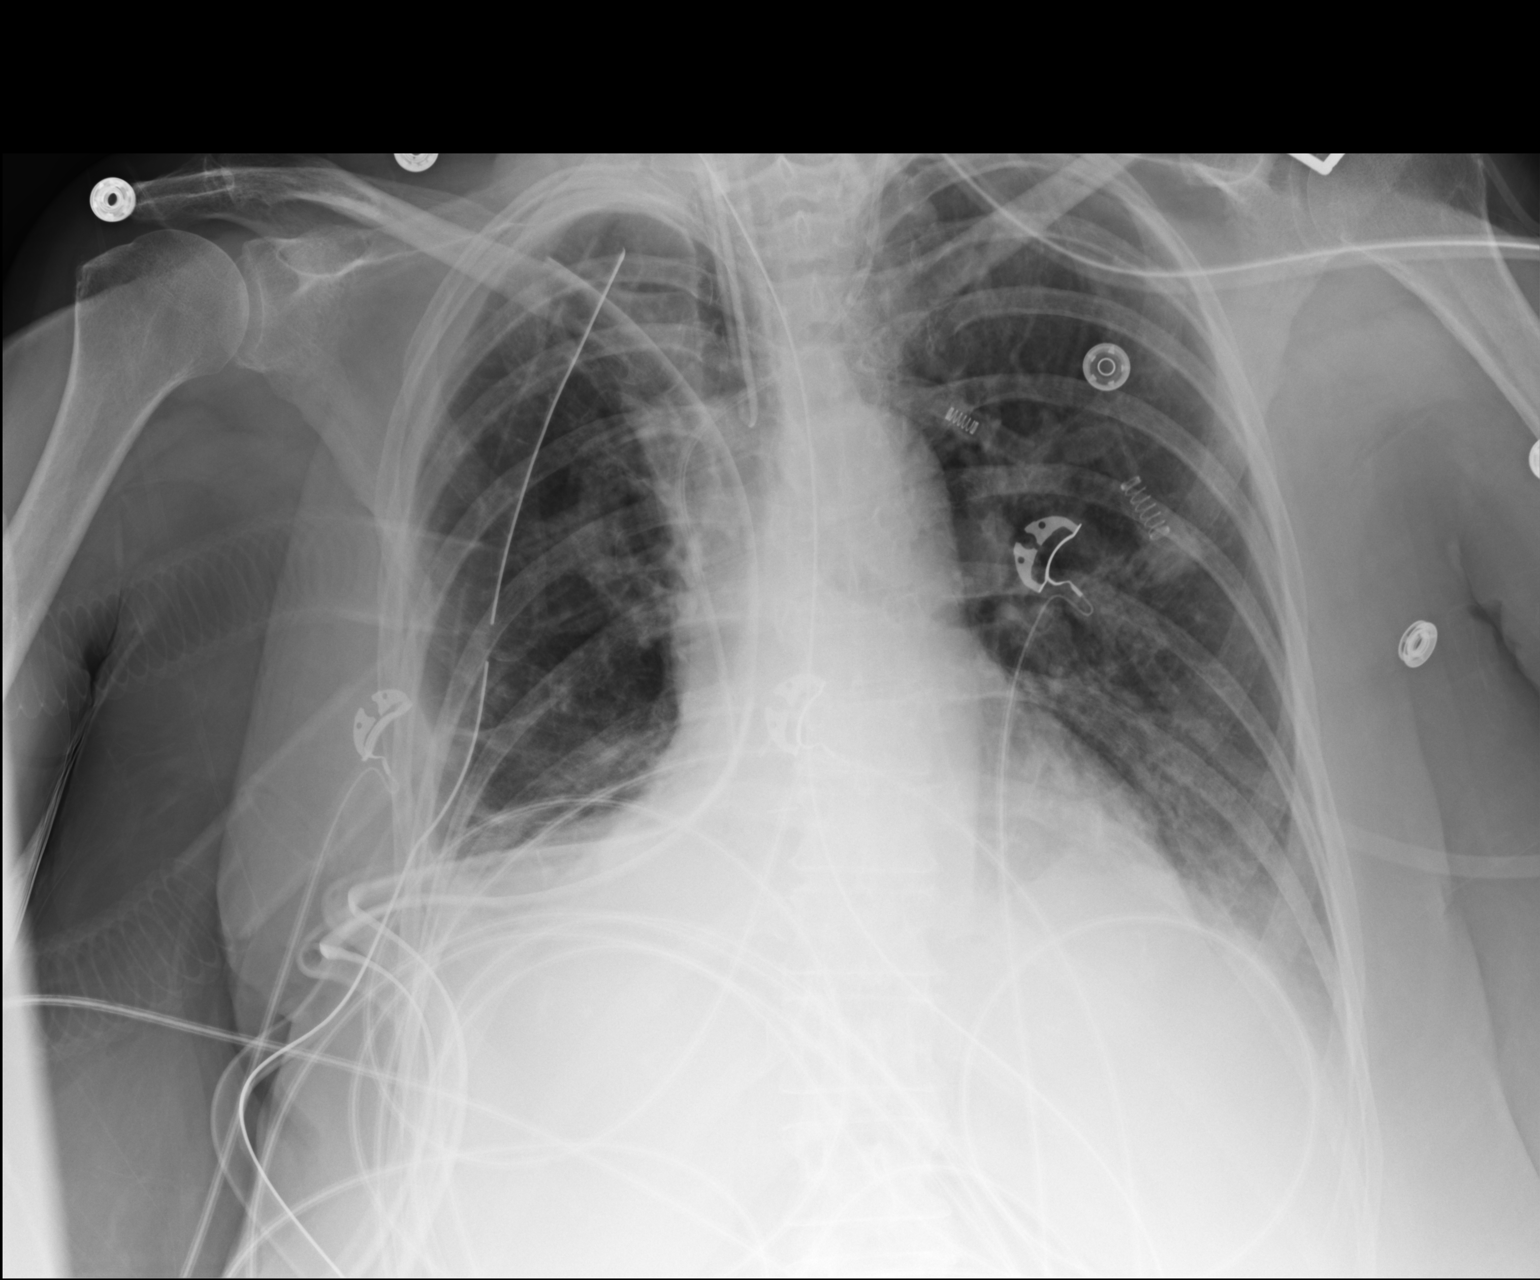

[1 of 1 positions shown; findings below may reference images not displayed]

FINDINGS: There remains mild volume loss on the right. There is no
pneumothorax. The right-sided chest tube tip projects over the
posterior aspect of the fourth rib. A second more medially
positioned chest tube has its tip approximately 2 cm lateral to the
hands. A third chest tube has its tip projecting over the medial
aspect of the ninth rib. The left lung is well-expanded. Minimal
basilar atelectasis is suspected on the left. The cardiac silhouette
is top-normal in size. The pulmonary vascularity is not engorged.
The endotracheal tube tip lies approximately 3.7 cm above the
carina. The esophagogastric tube tip projects below the inferior
margin of the image. The left internal jugular venous catheter tip
projects over the distal third of the SVC.
IMPRESSION: Fairly stable appearance of the chest. No right-sided pneumothorax
or significant pleural effusion. Minimal postsurgical volume loss
due to lower lobectomy. Minimal left basilar atelectasis. The
support tubes are in reasonable position.

## 2017-04-03 IMAGING — CR DG CHEST 1V PORT
1 series · 1 of 1 positions shown · non-contrast
Comparison: 11/20/2016.

CLINICAL DATA: History of pneumothorax.  Chest tube removal.

EXAM:
PORTABLE CHEST 1 VIEW

[AP]
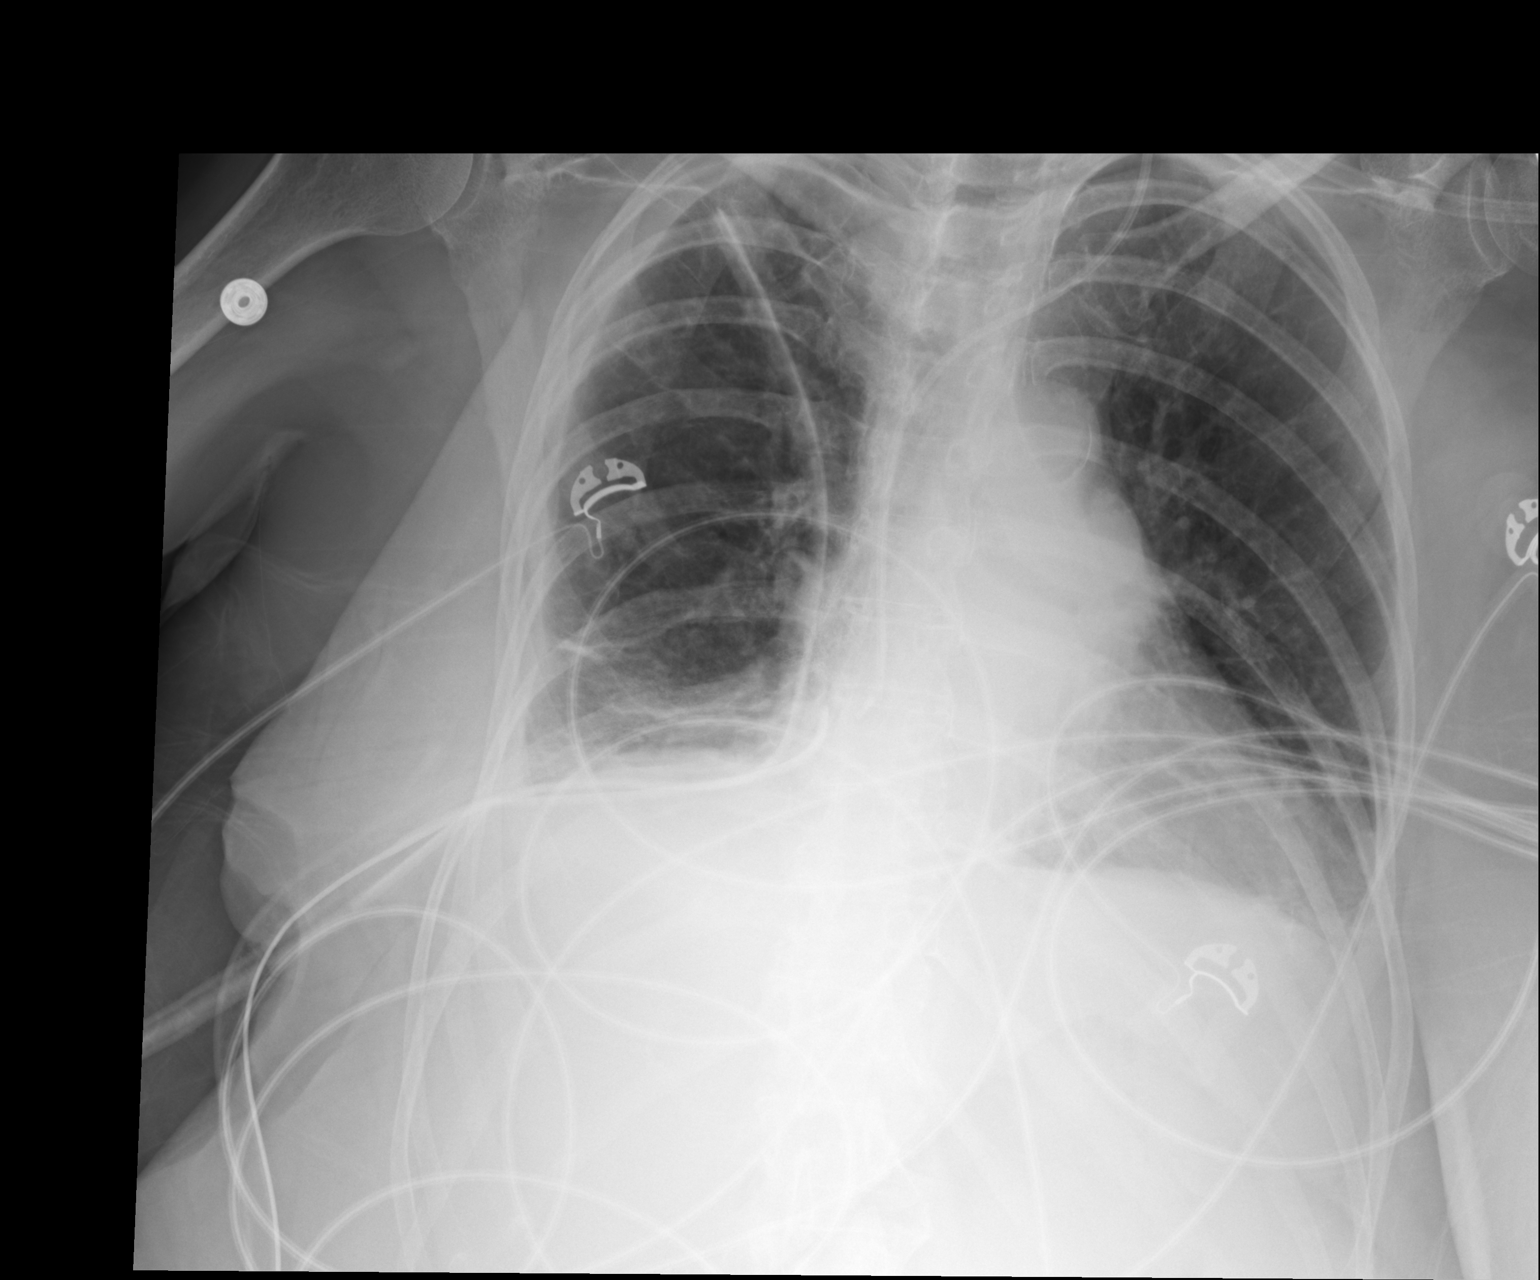

[1 of 1 positions shown; findings below may reference images not displayed]

FINDINGS: Interim extubation. Interim removal of the lateral most chest tube.
Two medial most chest tube is in stable position. Left IJ line in
stable position . Heart size stable. Right base subsegmental
atelectasis. Perihilar atelectasis/infiltrate. Small bilateral
pleural effusions. No pneumothorax.
IMPRESSION: 1. Interim extubation. Interim removal of lateral most chest tube.
Two medial most chest tubes in stable position. Left IJ line stable
position. No pneumothorax.

2. Persistent right base subsegmental atelectasis. Mild left
perihilar atelectasis/ infiltrate noted on today's exam. Small
bilateral pleural effusions.

## 2017-04-05 IMAGING — CR DG CHEST 1V PORT
1 series · 1 of 1 positions shown · non-contrast
Comparison: 11/23/2016

CLINICAL DATA: CHEST TUBE REMOVAL/RIGHT

EXAM:
PORTABLE CHEST 1 VIEW

[AP]
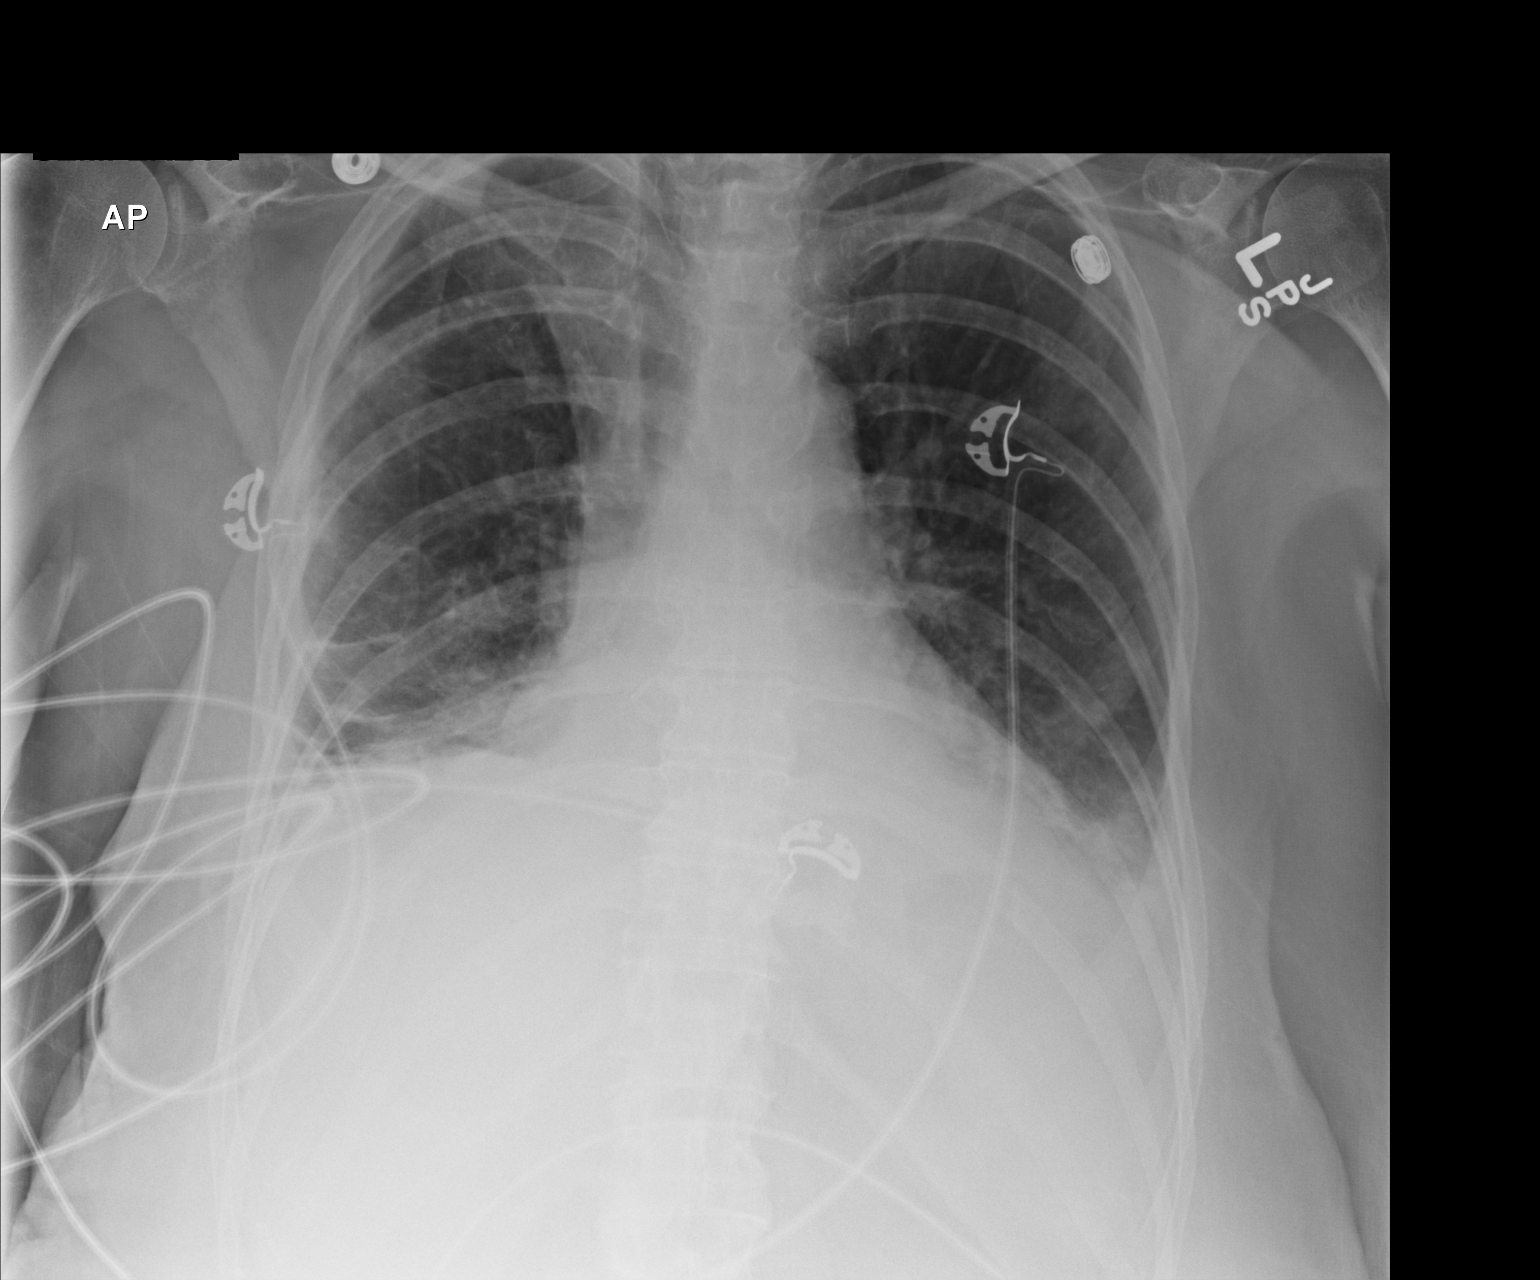

[1 of 1 positions shown; findings below may reference images not displayed]

FINDINGS: Right chest tube has been removed. No pneumothorax. Heart size is
accentuated by the AP portable technique. There are patchy densities
in the lung bases bilaterally which partially obscure the
hemidiaphragms, increased on the left. Opacity at the left lung base
now obscures the hemidiaphragm. No pulmonary edema.
IMPRESSION: Bilateral lower lobe opacities, increased on the left. No
pneumothorax following removal of chest tube.

## 2017-04-08 IMAGING — CR DG CHEST 1V PORT
1 series · 1 of 1 positions shown · non-contrast
Comparison: Chest x-rays dated 11/25/2016 and 11/24/2016.

CLINICAL DATA: Respiratory failure. Hx of COPD, partial right lung
removed(11/20/16), hypertension, primary spontaneous pneumothorax.
Nonsmoker, 2nd hand smoke exposure.

EXAM:
PORTABLE CHEST 1 VIEW

[AP]
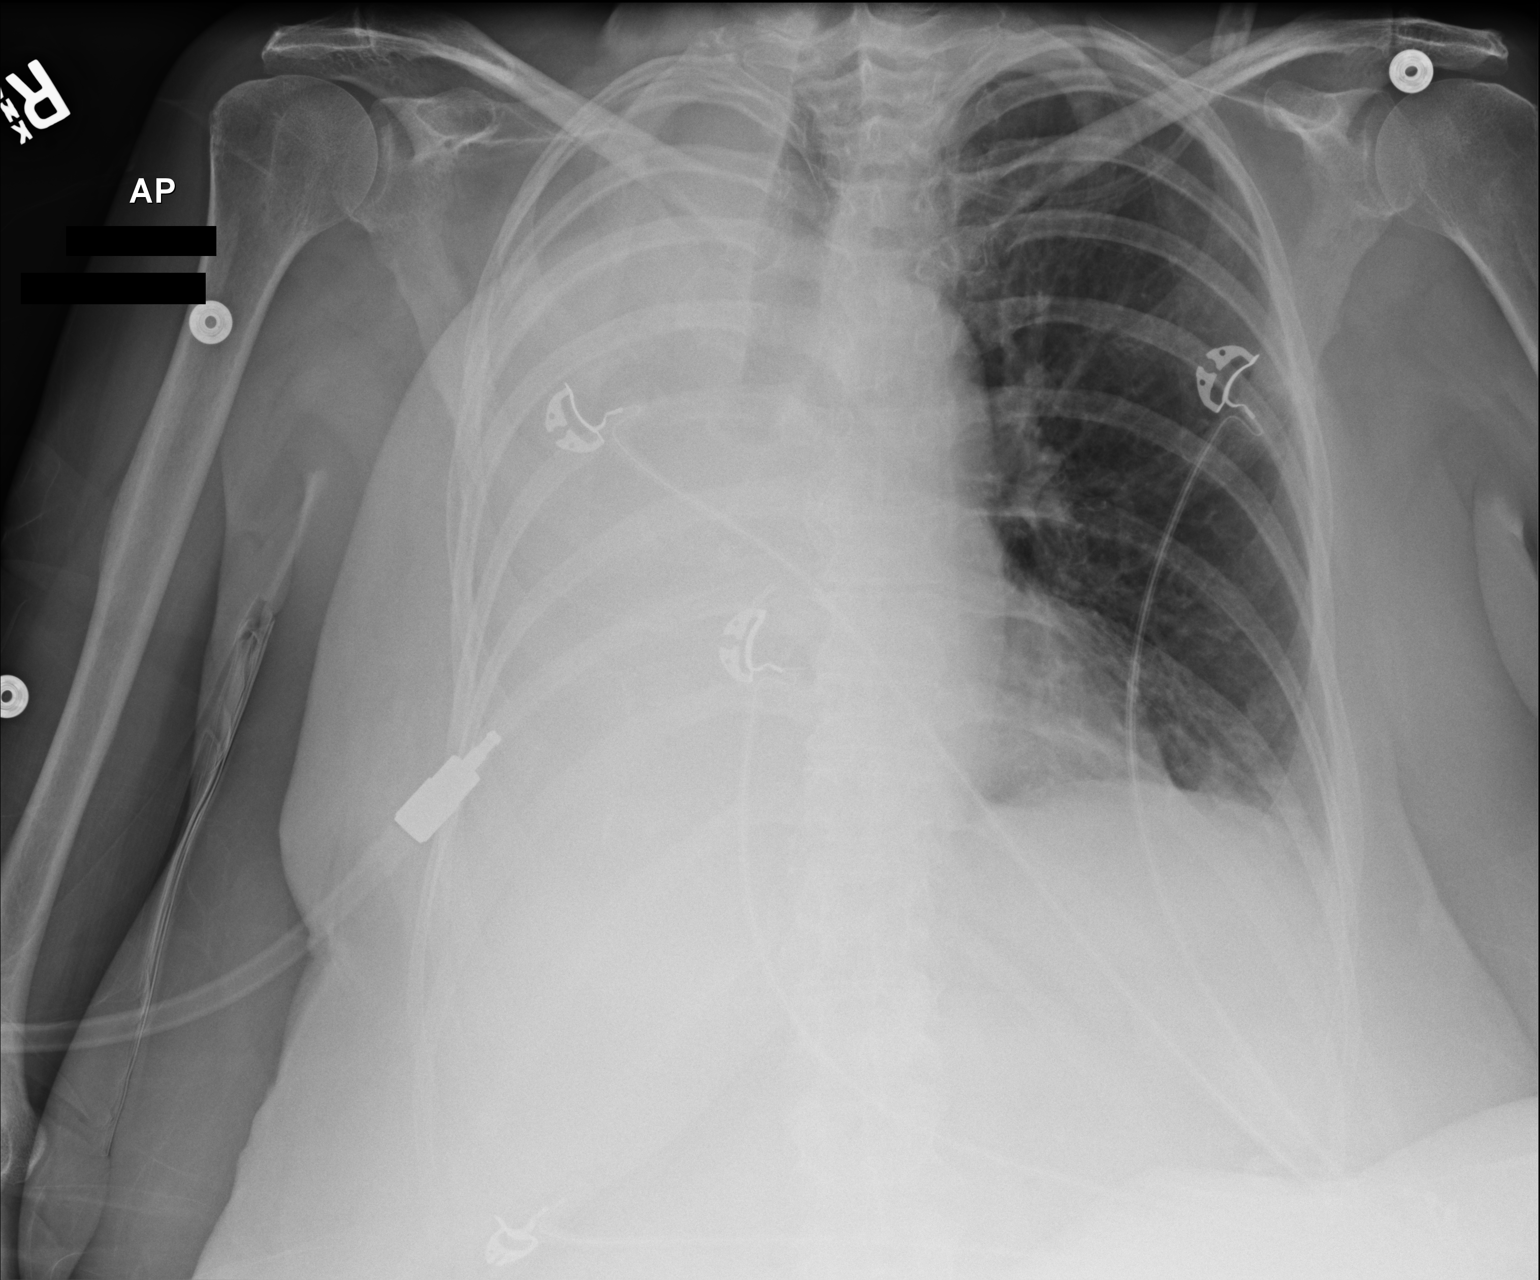

[1 of 1 positions shown; findings below may reference images not displayed]

FINDINGS: There is now complete opacification the right hemithorax, presumably
large pleural effusion and/or airspace collapse, favor large pleural
effusion given the stable position of the mediastinum. There is
stable volume loss on the right that is compatible with history of
previous right lower lobe resection.

Left lung is clear. Osseous structures about the chest are
unremarkable.
IMPRESSION: Complete opacification of the right hemithorax, significantly
worsened compared to yesterday's exam, favor large pleural effusion.

These results will be called to the ordering clinician or
representative by the Radiologist Assistant, and communication
documented in the PACS or zVision Dashboard.

## 2017-05-08 ENCOUNTER — Ambulatory Visit: Payer: Self-pay | Admitting: Pulmonary Disease

## 2017-05-22 ENCOUNTER — Ambulatory Visit (INDEPENDENT_AMBULATORY_CARE_PROVIDER_SITE_OTHER): Payer: Medicare Other | Admitting: Pulmonary Disease

## 2017-05-22 ENCOUNTER — Encounter: Payer: Self-pay | Admitting: Pulmonary Disease

## 2017-05-22 ENCOUNTER — Ambulatory Visit (INDEPENDENT_AMBULATORY_CARE_PROVIDER_SITE_OTHER)
Admission: RE | Admit: 2017-05-22 | Discharge: 2017-05-22 | Disposition: A | Discharge status: Home / Self Care | Payer: Medicare Other | Source: Ambulatory Visit | Attending: Pulmonary Disease | Admitting: Pulmonary Disease

## 2017-05-22 VITALS — BP 124/76 | HR 62 | Ht 64.0 in | Wt 126.2 lb

## 2017-05-22 DIAGNOSIS — J449 Chronic obstructive pulmonary disease, unspecified: Secondary | ICD-10-CM

## 2017-05-22 DIAGNOSIS — Z23 Encounter for immunization: Secondary | ICD-10-CM

## 2017-05-22 DIAGNOSIS — J90 Pleural effusion, not elsewhere classified: Secondary | ICD-10-CM

## 2017-05-22 DIAGNOSIS — J189 Pneumonia, unspecified organism: Secondary | ICD-10-CM

## 2017-05-22 NOTE — Patient Instructions (Addendum)
Continue using your inhalers as prescribed We will get a chest x-ray today to make sure your pleural effusion is improving Follow-up in 6 months.

## 2017-05-22 NOTE — Progress Notes (Signed)
Subjective:    Patient ID: Diana Gardner, female    DOB: 1950-10-30, 66 y.o.   MRN: [id number]  PROBLEM LIST Severe COPD Recurrent bronchitis  HPI Diana Gardner is a 66 year old with past medical history of COPD, bronchitis. She has history of recurrent bronchitis, pneumonias. She is hospitalized at least once every year with this. She was at [facility] in January 2017 with acute exacerbation of COPD, bronchitis. She does not recall if she was tested for the flu at that time.  She was hospitalized in April 2018 with right lower lobe necrotizing community acquired pneumonia complicated by pneumothorax and empyema and underwent a VATs and lobectomy. She developed acute on chronic hypercarbic respiratory failure and was seen by the pulmonary service while she is inpatient. Since her discharge she continues to do well with improvement in dyspnea. She is using her Combivent twice daily and hardly needs to use her Proventil rescue inhaler  DATA: PFTs 01/31/16 FVC 1.09 (38%], FEV1 0.64 (29%), F/F 59, TLC 86%, RV/TLC 167%, DLCO 68% Severe obstruction, Atrovent, moderate diffusion impairment  Imaging CXR 11/23/15 COPD changes with lingular scarring Chest x-ray 01/24/17-tiny right pleural effusion, hyperinflation I reviewed all images personally  Labs A1AT 11/23/15- 150 (normal) PIMM phenotype CT panel 11/23/15- Negative for mutations tested Immunoglobulins 11/23/15 - Normal IgG, IgA, IgM  Social History:  She is a lifelong nonsmoker. She had been exposed to secondhand smoke at home. No alcohol, drug use.  Family History: Mother-emphysema Father-heart disease.  PMH Hypertension   Current Outpatient Prescriptions:  .  acetylcysteine (MUCOMYST) 20 % nebulizer solution, Take 4 mLs by nebulization 3 (three) times daily., Disp: 30 mL, Rfl: 12 .  albuterol (PROVENTIL) (2.5 MG/3ML) 0.083% nebulizer solution, 1 neb three times daily as needed, Disp: , Rfl:  .  atorvastatin (LIPITOR) 20 MG  tablet, Take 20 mg by mouth daily., Disp: , Rfl:  .  budesonide (PULMICORT) 0.5 MG/2ML nebulizer solution, Take 2 mLs (0.5 mg total) by nebulization 2 (two) times daily., Disp: , Rfl: 12 .  COMBIVENT RESPIMAT 20-100 MCG/ACT AERS respimat, Inhale 1 puff into the lungs 4 (four) times daily., Disp: , Rfl:  .  Hydrocortisone (GERHARDT'S BUTT CREAM) CREA, Apply 1 application topically as needed for irritation., Disp: 1 each, Rfl: 1 .  lisinopril-hydrochlorothiazide (PRINZIDE,ZESTORETIC) 20-25 MG tablet, Take 1 tablet by mouth daily., Disp: , Rfl:  .  metoprolol tartrate (LOPRESSOR) 25 MG tablet, Take 1 tablet (25 mg total) by mouth 2 (two) times daily., Disp: 60 tablet, Rfl: 0 .  saccharomyces boulardii (FLORASTOR) 250 MG capsule, Take 1 capsule (250 mg total) by mouth 2 (two) times daily., Disp: , Rfl:  .  zolpidem (AMBIEN) 5 MG tablet, Take 5 mg by mouth at bedtime as needed for sleep., Disp: , Rfl:   Review of Systems Dyspnea on exertion with wheezing, nonproductive cough. No sputum production, fevers, chills, hemoptysis. No chest pain, palpitation. No nausea, vomiting, diarrhea, constipation. All other review of systems are negative.    Objective:   Physical Exam Blood pressure 124/76, pulse 62, height 5\' 4"  (1.626 m), weight 57.2 kg (126 lb 3.2 oz), SpO2 100 %. Gen:      No acute distress HEENT:  EOMI, sclera anicteric Neck:     No masses; no thyromegaly Lungs:    Clear to auscultation bilaterally; normal respiratory effort CV:         Regular rate and rhythm; no murmurs Abd:      + bowel sounds; soft, non-tender;  no palpable masses, no distension Ext:    No edema; adequate peripheral perfusion Skin:      Warm and dry; no rash Neuro: alert and oriented x 3 Psych: normal mood and affect.  Assessment & Plan:  Very severe COPD, recurrent bronchitis. PFTs show severe obstructive disease which is surprising as she is not an active smoker but only has secondhand smoke exposure. Her alpha-1  antitrypsin, CF panel, immunoglobulins were normal. There is family history of emphysema on her mother's side who was a smoker.   She is doing well on Combivent and will continue the same.  CAP with empyema S./p VATS Doing well after discharge Last chest x-ray shows small right effusion. We'll repeat an x-ray to make sure this is improving.  Plan: - Continue using the conbivent, duonebs - Chest x-ray  Follow-up in 6 months Marshell Garfinkel MD [facility] Pager [phone] If no answer or after 3pm call: [phone] 05/22/2017, 3:33 PM

## 2017-06-11 ENCOUNTER — Telehealth: Payer: Self-pay | Admitting: Pulmonary Disease

## 2017-06-11 DIAGNOSIS — R9389 Abnormal findings on diagnostic imaging of other specified body structures: Secondary | ICD-10-CM

## 2017-06-11 NOTE — Telephone Encounter (Signed)
Notes recorded by Marshell Garfinkel, MD on 05/24/2017 at 5:18 PM EDT Please let the patient know that there is an opacity in the right lung that may be a result of her recent surgery. We would like to evaluate this further with CT without contrast. Please order.       Advised pt of results. Pt understood and nothing further is needed.  CT order placed.

## 2017-06-14 ENCOUNTER — Ambulatory Visit (INDEPENDENT_AMBULATORY_CARE_PROVIDER_SITE_OTHER)
Admission: RE | Admit: 2017-06-14 | Discharge: 2017-06-14 | Disposition: A | Discharge status: Home / Self Care | Payer: Medicare Other | Source: Ambulatory Visit | Attending: Pulmonary Disease | Admitting: Pulmonary Disease

## 2017-06-14 DIAGNOSIS — R9389 Abnormal findings on diagnostic imaging of other specified body structures: Secondary | ICD-10-CM | POA: Diagnosis not present

## 2017-12-28 DIAGNOSIS — E785 Hyperlipidemia, unspecified: Secondary | ICD-10-CM | POA: Diagnosis not present

## 2017-12-28 DIAGNOSIS — J180 Bronchopneumonia, unspecified organism: Secondary | ICD-10-CM | POA: Diagnosis not present

## 2017-12-28 DIAGNOSIS — J449 Chronic obstructive pulmonary disease, unspecified: Secondary | ICD-10-CM | POA: Diagnosis not present

## 2017-12-28 DIAGNOSIS — K219 Gastro-esophageal reflux disease without esophagitis: Secondary | ICD-10-CM | POA: Diagnosis not present

## 2018-01-28 DIAGNOSIS — K219 Gastro-esophageal reflux disease without esophagitis: Secondary | ICD-10-CM | POA: Diagnosis not present

## 2018-01-28 DIAGNOSIS — E785 Hyperlipidemia, unspecified: Secondary | ICD-10-CM | POA: Diagnosis not present

## 2018-01-28 DIAGNOSIS — J180 Bronchopneumonia, unspecified organism: Secondary | ICD-10-CM | POA: Diagnosis not present

## 2018-01-28 DIAGNOSIS — J449 Chronic obstructive pulmonary disease, unspecified: Secondary | ICD-10-CM | POA: Diagnosis not present

## 2018-02-27 DIAGNOSIS — E785 Hyperlipidemia, unspecified: Secondary | ICD-10-CM | POA: Diagnosis not present

## 2018-02-27 DIAGNOSIS — J449 Chronic obstructive pulmonary disease, unspecified: Secondary | ICD-10-CM | POA: Diagnosis not present

## 2018-02-27 DIAGNOSIS — J180 Bronchopneumonia, unspecified organism: Secondary | ICD-10-CM | POA: Diagnosis not present

## 2018-02-27 DIAGNOSIS — K219 Gastro-esophageal reflux disease without esophagitis: Secondary | ICD-10-CM | POA: Diagnosis not present

## 2018-03-30 DIAGNOSIS — E785 Hyperlipidemia, unspecified: Secondary | ICD-10-CM | POA: Diagnosis not present

## 2018-03-30 DIAGNOSIS — J449 Chronic obstructive pulmonary disease, unspecified: Secondary | ICD-10-CM | POA: Diagnosis not present

## 2018-03-30 DIAGNOSIS — K219 Gastro-esophageal reflux disease without esophagitis: Secondary | ICD-10-CM | POA: Diagnosis not present

## 2018-03-30 DIAGNOSIS — J180 Bronchopneumonia, unspecified organism: Secondary | ICD-10-CM | POA: Diagnosis not present

## 2018-04-29 ENCOUNTER — Observation Stay (HOSPITAL_COMMUNITY)
Admission: AD | Admit: 2018-04-29 | Discharge: 2018-04-30 | Disposition: A | Discharge status: Home / Self Care | Payer: Medicare Other | Source: Ambulatory Visit | Attending: Pulmonary Disease | Admitting: Pulmonary Disease

## 2018-04-29 ENCOUNTER — Encounter (HOSPITAL_COMMUNITY): Payer: Self-pay

## 2018-04-29 ENCOUNTER — Inpatient Hospital Stay (HOSPITAL_COMMUNITY): Payer: Medicare Other

## 2018-04-29 ENCOUNTER — Other Ambulatory Visit: Payer: Self-pay

## 2018-04-29 ENCOUNTER — Encounter: Payer: Self-pay | Admitting: Nurse Practitioner

## 2018-04-29 ENCOUNTER — Ambulatory Visit (INDEPENDENT_AMBULATORY_CARE_PROVIDER_SITE_OTHER): Payer: Medicare Other | Admitting: Nurse Practitioner

## 2018-04-29 VITALS — BP 138/84 | HR 72 | Ht 61.5 in | Wt 138.4 lb

## 2018-04-29 DIAGNOSIS — R079 Chest pain, unspecified: Secondary | ICD-10-CM | POA: Diagnosis not present

## 2018-04-29 DIAGNOSIS — E785 Hyperlipidemia, unspecified: Secondary | ICD-10-CM | POA: Insufficient documentation

## 2018-04-29 DIAGNOSIS — F329 Major depressive disorder, single episode, unspecified: Secondary | ICD-10-CM | POA: Insufficient documentation

## 2018-04-29 DIAGNOSIS — G4700 Insomnia, unspecified: Secondary | ICD-10-CM | POA: Diagnosis not present

## 2018-04-29 DIAGNOSIS — I10 Essential (primary) hypertension: Secondary | ICD-10-CM | POA: Diagnosis not present

## 2018-04-29 DIAGNOSIS — Z79899 Other long term (current) drug therapy: Secondary | ICD-10-CM | POA: Diagnosis not present

## 2018-04-29 DIAGNOSIS — Z8249 Family history of ischemic heart disease and other diseases of the circulatory system: Secondary | ICD-10-CM | POA: Diagnosis not present

## 2018-04-29 DIAGNOSIS — J9620 Acute and chronic respiratory failure, unspecified whether with hypoxia or hypercapnia: Secondary | ICD-10-CM | POA: Diagnosis present

## 2018-04-29 DIAGNOSIS — Z825 Family history of asthma and other chronic lower respiratory diseases: Secondary | ICD-10-CM | POA: Insufficient documentation

## 2018-04-29 DIAGNOSIS — J449 Chronic obstructive pulmonary disease, unspecified: Secondary | ICD-10-CM | POA: Diagnosis not present

## 2018-04-29 DIAGNOSIS — J441 Chronic obstructive pulmonary disease with (acute) exacerbation: Secondary | ICD-10-CM | POA: Diagnosis not present

## 2018-04-29 DIAGNOSIS — R0602 Shortness of breath: Secondary | ICD-10-CM | POA: Insufficient documentation

## 2018-04-29 DIAGNOSIS — J9621 Acute and chronic respiratory failure with hypoxia: Secondary | ICD-10-CM | POA: Diagnosis not present

## 2018-04-29 LAB — CBC WITH DIFFERENTIAL/PLATELET
ABS IMMATURE GRANULOCYTES: 0 10*3/uL (ref 0.0–0.1)
Basophils Absolute: 0.1 10*3/uL (ref 0.0–0.1)
Basophils Relative: 1 %
Eosinophils Absolute: 0.3 10*3/uL (ref 0.0–0.7)
Eosinophils Relative: 4 %
HCT: 43.1 % (ref 36.0–46.0)
HEMOGLOBIN: 13.6 g/dL (ref 12.0–15.0)
Immature Granulocytes: 0 %
LYMPHS PCT: 14 %
Lymphs Abs: 0.9 10*3/uL (ref 0.7–4.0)
MCH: 28.5 pg (ref 26.0–34.0)
MCHC: 31.6 g/dL (ref 30.0–36.0)
MCV: 90.4 fL (ref 78.0–100.0)
MONO ABS: 0.5 10*3/uL (ref 0.1–1.0)
MONOS PCT: 8 %
NEUTROS ABS: 4.9 10*3/uL (ref 1.7–7.7)
Neutrophils Relative %: 73 %
Platelets: 270 10*3/uL (ref 150–400)
RBC: 4.77 MIL/uL (ref 3.87–5.11)
RDW: 13.2 % (ref 11.5–15.5)
WBC: 6.8 10*3/uL (ref 4.0–10.5)

## 2018-04-29 LAB — BRAIN NATRIURETIC PEPTIDE: B NATRIURETIC PEPTIDE 5: 115.4 pg/mL — AB (ref 0.0–100.0)

## 2018-04-29 LAB — PHOSPHORUS: Phosphorus: 4 mg/dL (ref 2.5–4.6)

## 2018-04-29 LAB — COMPREHENSIVE METABOLIC PANEL
ALBUMIN: 3.9 g/dL (ref 3.5–5.0)
ALK PHOS: 80 U/L (ref 38–126)
ALT: 28 U/L (ref 0–44)
ANION GAP: 10 (ref 5–15)
AST: 27 U/L (ref 15–41)
BILIRUBIN TOTAL: 0.6 mg/dL (ref 0.3–1.2)
BUN: 18 mg/dL (ref 8–23)
CO2: 26 mmol/L (ref 22–32)
Calcium: 9.6 mg/dL (ref 8.9–10.3)
Chloride: 104 mmol/L (ref 98–111)
Creatinine, Ser: 0.8 mg/dL (ref 0.44–1.00)
GFR calc Af Amer: >60 mL/min (ref >60)
GFR calc non Af Amer: >60 mL/min (ref >60)
GLUCOSE: 108 mg/dL — AB (ref 70–99)
Potassium: 4.4 mmol/L (ref 3.5–5.1)
Sodium: 140 mmol/L (ref 135–145)
TOTAL PROTEIN: 7.2 g/dL (ref 6.5–8.1)

## 2018-04-29 LAB — MAGNESIUM: Magnesium: 1.9 mg/dL (ref 1.7–2.4)

## 2018-04-29 LAB — PROCALCITONIN: Procalcitonin: <0.1 ng/mL

## 2018-04-29 LAB — STREP PNEUMONIAE URINARY ANTIGEN: Strep Pneumo Urinary Antigen: NEGATIVE

## 2018-04-29 MED ORDER — ZOLPIDEM TARTRATE 5 MG PO TABS: 5.0000 mg | ORAL_TABLET | Freq: Every evening | ORAL | Status: DC | PRN

## 2018-04-29 MED ORDER — ALBUTEROL SULFATE (2.5 MG/3ML) 0.083% IN NEBU
2.5000 mg | INHALATION_SOLUTION | RESPIRATORY_TRACT | Status: DC | PRN
  Administered 2018-04-29: 2.5 mg via RESPIRATORY_TRACT

## 2018-04-29 MED ORDER — ATORVASTATIN CALCIUM 20 MG PO TABS
20.0000 mg | ORAL_TABLET | Freq: Every day | ORAL | Status: DC
  Administered 2018-04-29 – 2018-04-30 (×2): 20 mg via ORAL
  Filled 2018-04-29 (×2): qty 1

## 2018-04-29 MED ORDER — IPRATROPIUM BROMIDE 0.02 % IN SOLN: 0.5000 mg | RESPIRATORY_TRACT | Status: DC

## 2018-04-29 MED ORDER — LEVALBUTEROL HCL 0.63 MG/3ML IN NEBU
0.6300 mg | INHALATION_SOLUTION | RESPIRATORY_TRACT | Status: AC
  Administered 2018-04-29: 0.63 mg via RESPIRATORY_TRACT

## 2018-04-29 MED ORDER — METHYLPREDNISOLONE SODIUM SUCC 40 MG IJ SOLR
40.0000 mg | Freq: Two times a day (BID) | INTRAMUSCULAR | Status: DC
  Administered 2018-04-29: 40 mg via INTRAVENOUS
  Filled 2018-04-29: qty 1

## 2018-04-29 MED ORDER — IPRATROPIUM-ALBUTEROL 0.5-2.5 (3) MG/3ML IN SOLN
3.0000 mL | Freq: Four times a day (QID) | RESPIRATORY_TRACT | Status: DC
  Administered 2018-04-29 – 2018-04-30 (×3): 3 mL via RESPIRATORY_TRACT
  Filled 2018-04-29 (×4): qty 3

## 2018-04-29 MED ORDER — CITALOPRAM HYDROBROMIDE 20 MG PO TABS
10.0000 mg | ORAL_TABLET | Freq: Every day | ORAL | Status: DC
  Administered 2018-04-29: 10 mg via ORAL
  Filled 2018-04-29: qty 1

## 2018-04-29 MED ORDER — IPRATROPIUM-ALBUTEROL 0.5-2.5 (3) MG/3ML IN SOLN: 3.0000 mL | RESPIRATORY_TRACT | Status: DC

## 2018-04-29 MED ORDER — ALBUTEROL SULFATE (2.5 MG/3ML) 0.083% IN NEBU
INHALATION_SOLUTION | RESPIRATORY_TRACT | Status: AC
  Filled 2018-04-29: qty 3

## 2018-04-29 MED ORDER — SODIUM CHLORIDE 0.9 % IV SOLN
INTRAVENOUS | Status: DC
  Administered 2018-04-29: 1000 mL via INTRAVENOUS

## 2018-04-29 MED ORDER — PREDNISONE 20 MG PO TABS
50.0000 mg | ORAL_TABLET | Freq: Every day | ORAL | Status: DC
  Administered 2018-04-30: 50 mg via ORAL
  Filled 2018-04-29: qty 2

## 2018-04-29 MED ORDER — METOPROLOL TARTRATE 25 MG PO TABS
25.0000 mg | ORAL_TABLET | Freq: Two times a day (BID) | ORAL | Status: DC
  Administered 2018-04-29 – 2018-04-30 (×2): 25 mg via ORAL
  Filled 2018-04-29 (×2): qty 1

## 2018-04-29 MED ORDER — SACCHAROMYCES BOULARDII 250 MG PO CAPS
250.0000 mg | ORAL_CAPSULE | Freq: Two times a day (BID) | ORAL | Status: DC
  Administered 2018-04-29 – 2018-04-30 (×2): 250 mg via ORAL
  Filled 2018-04-29 (×2): qty 1

## 2018-04-29 MED ORDER — HEPARIN SODIUM (PORCINE) 5000 UNIT/ML IJ SOLN
5000.0000 [IU] | Freq: Three times a day (TID) | INTRAMUSCULAR | Status: DC
  Administered 2018-04-29 – 2018-04-30 (×2): 5000 [IU] via SUBCUTANEOUS
  Filled 2018-04-29 (×2): qty 1

## 2018-04-29 MED ORDER — ALPRAZOLAM 0.5 MG PO TABS
0.5000 mg | ORAL_TABLET | Freq: Every evening | ORAL | Status: DC | PRN
  Administered 2018-04-30: 0.5 mg via ORAL
  Filled 2018-04-29: qty 1

## 2018-04-29 MED ORDER — PANTOPRAZOLE SODIUM 40 MG PO TBEC
40.0000 mg | DELAYED_RELEASE_TABLET | Freq: Every day | ORAL | Status: DC
  Administered 2018-04-29 – 2018-04-30 (×2): 40 mg via ORAL
  Filled 2018-04-29 (×2): qty 1

## 2018-04-29 MED ORDER — ALBUTEROL SULFATE (2.5 MG/3ML) 0.083% IN NEBU: 2.5000 mg | INHALATION_SOLUTION | RESPIRATORY_TRACT | Status: DC

## 2018-04-29 MED ORDER — BUDESONIDE 0.5 MG/2ML IN SUSP: 0.5000 mg | Freq: Two times a day (BID) | RESPIRATORY_TRACT | Status: DC

## 2018-04-29 NOTE — Progress Notes (Signed)
@Patient  ID: Diana Gardner, female    DOB: [birth year], 67 y.o.   MRN: [id number]  Chief Complaint  Patient presents with  . Wheezing    Referring provider: HamrickLorin Mercy, MD   HPI: 67 year old female with COPD followed by Dr. Vaughan Browner.  She has history of recurrent bronchitis and pneumonia with hospitalizations.   Tests: PFTs 01/31/16 FVC 1.09 (38%] FEV1 0.64 (29%) F/59 TLC 86% RV/TLC 167% DLCO 68%  Imaging CXR 11/23/15 COPD changes with lingular scarring  Labs A1AT 11/23/15- 150 (normal) PIMM phenotype CT panel 11/23/15- Negative for mutations tested Immunoglobulins 11/23/15 - Normal IgG, IgA, IgM  Social History:  She is a lifelong nonsmoker. She had been exposed to secondhand smoke at home. No alcohol, drug use.  Patient presents today with wheezing and shortness of breath. She describes this as severe shortness of breath and called EMS this morning, but decided not to go to the hospital at that time. Her symptoms have progressively worsened throughout the day. Her sats were at 88% on RA when she came into the office.   Shortness of Breath  This is a new problem. The current episode started yesterday. Associated symptoms include wheezing. Pertinent negatives include no fever, hemoptysis, leg pain, leg swelling or vomiting. The symptoms are aggravated by any activity. Treatments tried: albuterol neb. The treatment provided no relief. Her past medical history is significant for COPD.         Allergies  Allergen Reactions  . No Known Allergies     Immunization History  Administered Date(s) Administered  . Influenza Split 06/02/2015  . Influenza, High Dose Seasonal PF 05/22/2017  . Influenza,inj,Quad PF,6+ Mos 11/06/2016    Past Medical History:  Diagnosis Date  . COPD (chronic obstructive pulmonary disease) ([place])   . Empyema ([place])   . Hypertension   . Primary spontaneous pneumothorax     Tobacco History: Social History   Tobacco Use  Smoking  Status Never Smoker  Smokeless Tobacco Never Used   Counseling given: Yes   Outpatient Encounter Medications as of 04/29/2018  Medication Sig  . acetylcysteine (MUCOMYST) 20 % nebulizer solution Take 4 mLs by nebulization 3 (three) times daily.  Marland Kitchen albuterol (PROVENTIL) (2.5 MG/3ML) 0.083% nebulizer solution 1 neb three times daily as needed  . atorvastatin (LIPITOR) 20 MG tablet Take 20 mg by mouth daily.  . budesonide (PULMICORT) 0.5 MG/2ML nebulizer solution Take 2 mLs (0.5 mg total) by nebulization 2 (two) times daily.  . citalopram (CELEXA) 10 MG tablet Take 1 tablet by mouth at bedtime.  . COMBIVENT RESPIMAT 20-100 MCG/ACT AERS respimat Inhale 1 puff into the lungs 4 (four) times daily.  . Hydrocortisone (GERHARDT'S BUTT CREAM) CREA Apply 1 application topically as needed for irritation.  Marland Kitchen lisinopril-hydrochlorothiazide (PRINZIDE,ZESTORETIC) 20-25 MG tablet Take 1 tablet by mouth daily.  . metoprolol tartrate (LOPRESSOR) 25 MG tablet Take 1 tablet (25 mg total) by mouth 2 (two) times daily.  Marland Kitchen saccharomyces boulardii (FLORASTOR) 250 MG capsule Take 1 capsule (250 mg total) by mouth 2 (two) times daily.  Marland Kitchen zolpidem (AMBIEN) 5 MG tablet Take 5 mg by mouth at bedtime as needed for sleep.  . [EXPIRED] levalbuterol (XOPENEX) nebulizer solution 0.63 mg    No facility-administered encounter medications on file as of 04/29/2018.      Review of Systems  Review of Systems  Constitutional: Negative for fever.  Respiratory: Positive for shortness of breath and wheezing. Negative for hemoptysis.   Cardiovascular: Negative for leg  swelling.  Gastrointestinal: Negative for vomiting.       Physical Exam  BP 138/84 (BP Location: Right Arm, Patient Position: Sitting, Cuff Size: Normal)   Pulse 72   Ht 5' 1.5" (1.562 m)   Wt 138 lb 6.4 oz (62.8 kg)   SpO2 (!) 89%   BMI 25.73 kg/m   Wt Readings from Last 5 Encounters:  04/29/18 138 lb 6.4 oz (62.8 kg)  05/22/17 126 lb 3.2 oz (57.2 kg)    01/24/17 128 lb 9.6 oz (58.3 kg)  12/04/16 129 lb 6.4 oz (58.7 kg)  01/31/16 151 lb (68.5 kg)     Physical Exam  Constitutional: She is oriented to person, place, and time. She appears well-developed and well-nourished. No distress.  Cardiovascular: Normal rate and regular rhythm.  Pulmonary/Chest: She is in respiratory distress. She has wheezes. She has rales.  Neurological: She is alert and oriented to person, place, and time.  Psychiatric: She has a normal mood and affect.  Nursing note and vitals reviewed.      Assessment & Plan:   COPD with acute exacerbation [facility]) Patient Instructions  Xopenex neb in office today provided little to no relief Patient was sent to the ED  Family in room to transport Will go straight to [facility] Report called to ED charge nurse       Fenton Foy, NP 04/29/2018

## 2018-04-29 NOTE — H&P (Signed)
Diana Gardner  ZOX:[id number] DOB: 12-02-1950 DOA: 04/29/2018 PCP: [name], MD    LOS: 0 days   Reason for Consult / Chief Complaint:  Shortness of breath  Consulting MD and date:  Direct admission from Dr. Vaughan Browner on 9/9  HPI/Summary of hospital stay:  67 year old female with past medical history as below, which is notable for severe chronic obstructive pulmonary disease on nocturnal oxygen.  Recent course also complicated by admission for necrotizing pneumonia and empyema back in 2017 from which she has improved pretty well.  At baseline she is able to shop at the grocery store without getting winded but does struggle going up stairs at times.  She has been in her usual state of health until 9/8 when she developed a nonproductive cough but was breathing pretty normally.  Then the morning of 9/9 she was markedly more dyspneic with a worse cough.  She initially called EMS, but upon their arrival her O2 sats was good and they recommended she call her doctor's office.  She presents to the pulmonary clinic with this complaint, where she was found to be very wheezy and in distress.  She did not improve with albuterol nebulizer and was admitted to the hospital.  She denies fever/chills and productive cough.  Subjective:  Feeling better now after breathing Tx  Objective   Blood pressure (!) 188/77, pulse 64, temperature 98 F (36.7 C), temperature source Oral, resp. rate (!) 28, height 5\' 4"  (1.626 m), weight 60.8 kg, SpO2 97 %.       No intake or output data in the 24 hours ending 04/29/18 1837 Filed Weights   04/29/18 1732  Weight: 60.8 kg    Examination: General: Elderly appearing female in NAD HENT: [place]/At, PERRL, no JVD Lungs: Coarse wheeze Cardiovascular: RRR, no MRG Abdomen: Soft, non-tender, non-distended Extremities: No acute deformity Neuro: Alert, oriented, non-focal  Consults: date of consult/date signed off & final recs:   Procedures:  Significant  Diagnostic Tests:  Micro Data:  Antimicrobials:   Resolved Hospital Problem list    Assessment & Plan:   COPD with acute exacerbation - Duoneb q 6 and PRN - Will give one dose of solumedrol and start prednisone 50mg  tomorrow and plan for taper.  - Holding home combivent, budesonide while on systemic steroids - CXR  Hypertension - continue home lopressor  HLD - continue home lipitor  Insomnia -holding home ambien -PRN QHS low dose xanax  Disposition / Summary of Today's Plan 04/29/18   67 year old female with COPD admitted for exacerbation. Nebs and steroids.   Best Practice / Goals of Care / Disposition.   DVT prophylaxis: Heparin GI prophylaxis: Protonix Diet: Heart Healthy Mobility: Up with assist Code Status: Full Family Communication: patient and daughter updated bedside.   Labs   CBC: No results for input(s): WBC, NEUTROABS, HGB, HCT, MCV, PLT in the last 168 hours. Basic Metabolic Panel: No results for input(s): NA, K, CL, CO2, GLUCOSE, BUN, CREATININE, CALCIUM, MG, PHOS in the last 168 hours. GFR: CrCl cannot be calculated (Patient's most recent lab result is older than the maximum 21 days allowed.). No results for input(s): PROCALCITON, WBC, LATICACIDVEN in the last 168 hours. Liver Function Tests: No results for input(s): AST, ALT, ALKPHOS, BILITOT, PROT, ALBUMIN in the last 168 hours. No results for input(s): LIPASE, AMYLASE in the last 168 hours. No results for input(s): AMMONIA in the last 168 hours. ABG    Component Value Date/Time  PHART 7.334 (L) 11/22/2016 0506   PCO2ART 61.4 (H) 11/22/2016 0506   PO2ART 51.6 (L) 11/22/2016 0506   HCO3 32.1 (H) 11/22/2016 0506   TCO2 29 11/16/2016 1139   ACIDBASEDEF 12.0 (H) 11/07/2016 2005   O2SAT 82.1 11/22/2016 0506    Coagulation Profile: No results for input(s): INR, PROTIME in the last 168 hours. Cardiac Enzymes: No results for input(s): CKTOTAL, CKMB, CKMBINDEX, TROPONINI in the last 168  hours. HbA1C: No results found for: HGBA1C CBG: No results for input(s): GLUCAP in the last 168 hours.   Review of Systems:    Past medical history  She,  has a past medical history of COPD (chronic obstructive pulmonary disease) ([place]), Empyema ([place]), Hypertension, and Primary spontaneous pneumothorax.   Surgical History    Past Surgical History:  Procedure Laterality Date  . APPENDECTOMY    . VIDEO ASSISTED THORACOSCOPY (VATS)/EMPYEMA Right 11/15/2016   Procedure: VIDEO ASSISTED THORACOSCOPY (VATS), MINI THORACOTOMY, DRAINAGE OF EMPYEMA, DECORTICATION, RIGHT LOWER LOBE RESECTION;  Surgeon: Melrose Nakayama, MD;  Location: [facility] [facility];  Service: Thoracic;  Laterality: Right;  Marland Kitchen VIDEO BRONCHOSCOPY N/A 11/15/2016   Procedure: VIDEO BRONCHOSCOPY;  Surgeon: Melrose Nakayama, MD;  Location: [facility];  Service: Thoracic;  Laterality: N/A;     Social History   Social History   Socioeconomic History  . Marital status: Widowed    Spouse name: Not on file  . Number of children: Not on file  . Years of education: Not on file  . Highest education level: Not on file  Occupational History  . Not on file  Social Needs  . Financial resource strain: Not hard at all  . Food insecurity:    Worry: Patient refused    Inability: Patient refused  . Transportation needs:    Medical: Patient refused    Non-medical: Patient refused  Tobacco Use  . Smoking status: Never Smoker  . Smokeless tobacco: Never Used  Substance and Sexual Activity  . Alcohol use: Yes    Alcohol/week: 0.0 standard drinks    Comment: once in a while she states   . Drug use: Not on file  . Sexual activity: Not on file  Lifestyle  . Physical activity:    Days per week: Not on file    Minutes per session: Not on file  . Stress: Not on file  Relationships  . Social connections:    Talks on phone: Not on file    Gets together: Not on file    Attends religious service: Not on file    Active member of club or  organization: Not on file    Attends meetings of clubs or organizations: Not on file    Relationship status: Not on file  . Intimate partner violence:    Fear of current or ex partner: Not on file    Emotionally abused: Not on file    Physically abused: Not on file    Forced sexual activity: Not on file  Other Topics Concern  . Not on file  Social History Narrative  . Not on file  ,  reports that she has never smoked. She has never used smokeless tobacco. She reports that she drinks alcohol.   Family history   Her family history includes Emphysema in her mother; Heart attack in her father.   Allergies Allergies  Allergen Reactions  . No Known Allergies     Home meds  Prior to Admission medications   Medication Sig Start Date End Date  Taking? Authorizing Provider  acetylcysteine (MUCOMYST) 20 % nebulizer solution Take 4 mLs by nebulization 3 (three) times daily. 12/04/16   Theodis Blaze, MD  albuterol (PROVENTIL) (2.5 MG/3ML) 0.083% nebulizer solution 1 neb three times daily as needed 01/12/16   [provider]  atorvastatin (LIPITOR) 20 MG tablet Take 20 mg by mouth daily.    [provider]  budesonide (PULMICORT) 0.5 MG/2ML nebulizer solution Take 2 mLs (0.5 mg total) by nebulization 2 (two) times daily. 12/04/16   Theodis Blaze, MD  citalopram (CELEXA) 10 MG tablet Take 1 tablet by mouth at bedtime. 04/05/18   [provider]  COMBIVENT RESPIMAT 20-100 MCG/ACT AERS respimat Inhale 1 puff into the lungs 4 (four) times daily. 09/19/15   [provider]  Hydrocortisone (GERHARDT'S BUTT CREAM) CREA Apply 1 application topically as needed for irritation. 12/04/16   Theodis Blaze, MD  lisinopril-hydrochlorothiazide (PRINZIDE,ZESTORETIC) 20-25 MG tablet Take 1 tablet by mouth daily. 09/21/15   [provider]  metoprolol tartrate (LOPRESSOR) 25 MG tablet Take 1 tablet (25 mg total) by mouth 2 (two) times daily. 12/04/16   Theodis Blaze, MD   saccharomyces boulardii (FLORASTOR) 250 MG capsule Take 1 capsule (250 mg total) by mouth 2 (two) times daily. 12/04/16   Theodis Blaze, MD  zolpidem (AMBIEN) 5 MG tablet Take 5 mg by mouth at bedtime as needed for sleep.    [provider]     Georgann Housekeeper, AGACNP-BC [place] [place] [facility] Pager [phone] or [phone]  04/29/2018 6:44 PM

## 2018-04-29 NOTE — Assessment & Plan Note (Signed)
Patient Instructions  Xopenex neb in office today provided little to no relief Patient was sent to the ED  Family in room to transport Will go straight to Diana Gardner Report called to ED charge nurse

## 2018-04-29 NOTE — Patient Instructions (Addendum)
Xopenex neb in office today provided little to no relief Patient was sent to the ED  Family in room to transport Will go straight to [facility] Report called to ED charge nurse

## 2018-04-30 DIAGNOSIS — E785 Hyperlipidemia, unspecified: Secondary | ICD-10-CM | POA: Diagnosis not present

## 2018-04-30 DIAGNOSIS — J9621 Acute and chronic respiratory failure with hypoxia: Secondary | ICD-10-CM | POA: Diagnosis not present

## 2018-04-30 DIAGNOSIS — J449 Chronic obstructive pulmonary disease, unspecified: Secondary | ICD-10-CM | POA: Diagnosis not present

## 2018-04-30 DIAGNOSIS — Z825 Family history of asthma and other chronic lower respiratory diseases: Secondary | ICD-10-CM | POA: Diagnosis not present

## 2018-04-30 DIAGNOSIS — K219 Gastro-esophageal reflux disease without esophagitis: Secondary | ICD-10-CM | POA: Diagnosis not present

## 2018-04-30 DIAGNOSIS — R0602 Shortness of breath: Secondary | ICD-10-CM | POA: Diagnosis not present

## 2018-04-30 DIAGNOSIS — J441 Chronic obstructive pulmonary disease with (acute) exacerbation: Secondary | ICD-10-CM | POA: Diagnosis not present

## 2018-04-30 DIAGNOSIS — G4700 Insomnia, unspecified: Secondary | ICD-10-CM | POA: Diagnosis not present

## 2018-04-30 DIAGNOSIS — Z8249 Family history of ischemic heart disease and other diseases of the circulatory system: Secondary | ICD-10-CM | POA: Diagnosis not present

## 2018-04-30 DIAGNOSIS — Z79899 Other long term (current) drug therapy: Secondary | ICD-10-CM | POA: Diagnosis not present

## 2018-04-30 DIAGNOSIS — J180 Bronchopneumonia, unspecified organism: Secondary | ICD-10-CM | POA: Diagnosis not present

## 2018-04-30 DIAGNOSIS — I10 Essential (primary) hypertension: Secondary | ICD-10-CM | POA: Diagnosis not present

## 2018-04-30 LAB — BASIC METABOLIC PANEL
Anion gap: 7 (ref 5–15)
BUN: 20 mg/dL (ref 8–23)
CALCIUM: 8.9 mg/dL (ref 8.9–10.3)
CO2: 25 mmol/L (ref 22–32)
CREATININE: 0.93 mg/dL (ref 0.44–1.00)
Chloride: 107 mmol/L (ref 98–111)
GFR calc Af Amer: >60 mL/min (ref >60)
GFR calc non Af Amer: >60 mL/min (ref >60)
Glucose, Bld: 166 mg/dL — ABNORMAL HIGH (ref 70–99)
Potassium: 5 mmol/L (ref 3.5–5.1)
SODIUM: 139 mmol/L (ref 135–145)

## 2018-04-30 LAB — CBC
HCT: 37.6 % (ref 36.0–46.0)
Hemoglobin: 11.9 g/dL — ABNORMAL LOW (ref 12.0–15.0)
MCH: 28.7 pg (ref 26.0–34.0)
MCHC: 31.6 g/dL (ref 30.0–36.0)
MCV: 90.6 fL (ref 78.0–100.0)
Platelets: 229 10*3/uL (ref 150–400)
RBC: 4.15 MIL/uL (ref 3.87–5.11)
RDW: 13.1 % (ref 11.5–15.5)
WBC: 4.2 10*3/uL (ref 4.0–10.5)

## 2018-04-30 LAB — MAGNESIUM: Magnesium: 1.9 mg/dL (ref 1.7–2.4)

## 2018-04-30 LAB — HIV ANTIBODY (ROUTINE TESTING W REFLEX): HIV Screen 4th Generation wRfx: NONREACTIVE

## 2018-04-30 LAB — PHOSPHORUS: Phosphorus: 3.8 mg/dL (ref 2.5–4.6)

## 2018-04-30 MED ORDER — PREDNISONE 10 MG PO TABS: ORAL_TABLET | ORAL | 0 refills | Status: AC

## 2018-04-30 NOTE — Care Management Note (Signed)
Case Management Note  Patient Details  Name: Diana Gardner MRN: [id number] Date of Birth: April 23, 1951  Subjective/Objective:  From home, on home oxygen at night prn per patient.  Presents with copd ex. For dc home today, no needs.                    Action/Plan: DC home when ready.  Expected Discharge Date:  04/30/18               Expected Discharge Plan:  Home/Self Care  In-House Referral:     Discharge planning Services  CM Consult  Post Acute Care Choice:    Choice offered to:     DME Arranged:    DME Agency:     HH Arranged:    HH Agency:     Status of Service:  Completed, signed off  If discussed at [employer] of Stay Meetings, dates discussed:    Additional Comments:  Zenon Mayo, RN 04/30/2018, 12:05 PM

## 2018-04-30 NOTE — Care Management Obs Status (Signed)
[place] NOTIFICATION   Patient Details  Name: KYLA DUFFY MRN: [id number] Date of Birth: Nov 09, 1950   Medicare Observation Status Notification Given:  Yes    Zenon Mayo, RN 04/30/2018, 11:58 AM

## 2018-04-30 NOTE — Discharge Summary (Signed)
PCCM Discharge Summary  Admission date: 04/29/2018 Discharge date: 04/30/2018  Chief complaint:  Shortness of breath  Discharge diagnoses: COPD exacerbation Acute on chronic hypoxic respiratory failure Hypertension History of hyperlipidemia History of depression  Brief summary of hospital stay: 67 yo female presented to pulmonary office on 04/29/18 with cough, wheeze, and shortness of breath.  She was treated with albuterol nebulizer in office, but didn't improve.  She was admitted to hospital.  She was treated with systemic steroids with improvement in her symptoms.  Chest xray from 04/29/18 (reviewed by me) showed normal heart size and no pulmonary infiltrates.  Her procalcitonin and pneumococcal antigen were negative.  She was transitioned back to her baseline 2 to 3 liters oxygen.  She was able to ambulate in her room with minimal respiratory symptoms.  Past medical history: She  has a past medical history of COPD (chronic obstructive pulmonary disease) ([place]), Empyema ([place]), Hypertension, and Primary spontaneous pneumothorax.  Past surgical history: She  has a past surgical history that includes Appendectomy; Video bronchoscopy (N/A, 11/15/2016); and Video assisted thoracoscopy (vats)/empyema (Right, 11/15/2016).  Family history: Her family history includes Emphysema in her mother; Heart attack in her father.  Social history: She  reports that she has never smoked. She has never used smokeless tobacco. She reports that she drinks alcohol.  Allergies: No know drug allergies  Vital signs: BP (!) 141/62 (BP Location: Right Arm)   Pulse (!) 57   Temp (!) 97.4 F (36.3 C) (Oral)   Resp (!) 28   Ht 5\' 4"  (1.626 m)   Wt 62.4 kg   SpO2 100%   BMI 23.61 kg/m   Physical exam: General - alert Eyes - pupils reactive ENT - no sinus tenderness, no stridor Cardiac - regular rate/rhythm, no murmur Chest - equal breath sounds b/l, no wheezing or rales Abdomen - soft, non tender, +  bowel sounds, no hepatosplenomegaly GU - no lesions noted Extremities - no cyanosis, clubbing, or edema Skin - no rashes Lymphatics - no lymphadenopathy Neuro - CN intact, normal strength, moves extremities, follows commands Psych - normal mood and behavior  Discharge medications: - Prednisone 40 mg daily for 2 days, 30 mg daily for 2 days, 20 mg daily for 2 days, 10 mg daily for 2 days, then stop - Albuterol nebulizer every 4 to 6 hours as needed for cough, wheeze, or shortness of breath - Combivent respimat 1 puff every 4 to 6 hours as needed for cough, wheeze, or shortness of breath - Budesonide 0.5 mg nebulized twice per day - Lipitor 20 mg daily - Celexa 10 mg nightly - Lopressor 25 mg twice per day - Lisinopril-hydrochlorothiazide 20-25 mg one pill daily - Florastor 250 mg twice per day - Ambien 5 mg at night as needed for sleep - Melatonin 5 mg at night as needed for sleep - Oxygen 2 liters nasal cannula 24/7  Diet:  Regular diet  Activity: As tolerated  Follow up: [facility] on Tuesday, 05/07/18 at 11 am  Time spent with discharge planning 39 minutes  Chesley Mires, MD [place] 04/30/2018, 11:00 AM

## 2018-04-30 NOTE — Care Management CC44 (Signed)
Condition Code 44 Documentation Completed  Patient Details  Name: CAROLANN BRAZELL MRN: [id number] Date of Birth: Oct 15, 1950   Condition Code 44 given:  Yes Patient signature on Condition Code 44 notice:  Yes Documentation of 2 MD's agreement:  Yes Code 44 added to claim:  Yes    Zenon Mayo, RN 04/30/2018, 11:59 AM

## 2018-04-30 NOTE — Plan of Care (Signed)
Patient ready for discharge. 

## 2018-05-01 LAB — URINE CULTURE

## 2018-05-01 LAB — LEGIONELLA PNEUMOPHILA SEROGP 1 UR AG: L. pneumophila Serogp 1 Ur Ag: NEGATIVE

## 2018-05-01 NOTE — Consult Note (Signed)
            [facility] CM Primary Care Navigator  05/01/2018  Diana Gardner [birth year] [id number]   Wenttoseepatient at the bedsideto identify possible discharge needs but she was alreadydischargedhome per staff.    PerMD note, patientwas seen for shortness of breath, was treated with systemic steroids with improvement in her symptoms. (COPD exacerbation, acute on chronic hypoxic respiratory failure)  Primary care provider's officeis listed asprovidingtransition of care (TOC).  Patient hasdischarge instruction to follow-up withpulmonary provider on 05/07/18 at 11 am.  Primary care provider's office called Brayton Layman for [facility]) to notify of patient's health issues needing follow-up.  Made aware to refer patient to [facility] care management if deemed necessary and appropriate for services.   For additional questions please contact:  Diana Felty A. , BSN, RN-BC [facility] PRIMARY CARE Navigator Cell: [phone]

## 2018-05-07 ENCOUNTER — Inpatient Hospital Stay: Payer: Self-pay | Admitting: Nurse Practitioner

## 2018-05-09 DIAGNOSIS — J441 Chronic obstructive pulmonary disease with (acute) exacerbation: Secondary | ICD-10-CM | POA: Diagnosis not present

## 2018-05-09 DIAGNOSIS — J189 Pneumonia, unspecified organism: Secondary | ICD-10-CM | POA: Diagnosis not present

## 2018-05-09 DIAGNOSIS — Z7982 Long term (current) use of aspirin: Secondary | ICD-10-CM | POA: Diagnosis not present

## 2018-05-09 DIAGNOSIS — R0902 Hypoxemia: Secondary | ICD-10-CM | POA: Diagnosis not present

## 2018-05-09 DIAGNOSIS — A419 Sepsis, unspecified organism: Secondary | ICD-10-CM | POA: Diagnosis not present

## 2018-05-09 DIAGNOSIS — I10 Essential (primary) hypertension: Secondary | ICD-10-CM | POA: Diagnosis not present

## 2018-05-09 DIAGNOSIS — R062 Wheezing: Secondary | ICD-10-CM | POA: Diagnosis not present

## 2018-05-09 DIAGNOSIS — R0602 Shortness of breath: Secondary | ICD-10-CM | POA: Diagnosis not present

## 2018-05-09 DIAGNOSIS — J440 Chronic obstructive pulmonary disease with acute lower respiratory infection: Secondary | ICD-10-CM | POA: Diagnosis not present

## 2018-05-09 DIAGNOSIS — K219 Gastro-esophageal reflux disease without esophagitis: Secondary | ICD-10-CM | POA: Diagnosis not present

## 2018-05-09 DIAGNOSIS — Z23 Encounter for immunization: Secondary | ICD-10-CM | POA: Diagnosis not present

## 2018-05-09 DIAGNOSIS — R05 Cough: Secondary | ICD-10-CM | POA: Diagnosis not present

## 2018-05-09 DIAGNOSIS — Z79899 Other long term (current) drug therapy: Secondary | ICD-10-CM | POA: Diagnosis not present

## 2018-05-09 DIAGNOSIS — E7800 Pure hypercholesterolemia, unspecified: Secondary | ICD-10-CM | POA: Diagnosis not present

## 2018-05-10 ENCOUNTER — Inpatient Hospital Stay: Payer: Self-pay | Admitting: Nurse Practitioner

## 2018-05-11 DIAGNOSIS — J440 Chronic obstructive pulmonary disease with acute lower respiratory infection: Secondary | ICD-10-CM | POA: Diagnosis not present

## 2018-05-11 DIAGNOSIS — E7800 Pure hypercholesterolemia, unspecified: Secondary | ICD-10-CM | POA: Diagnosis not present

## 2018-05-11 DIAGNOSIS — I10 Essential (primary) hypertension: Secondary | ICD-10-CM | POA: Diagnosis not present

## 2018-05-11 DIAGNOSIS — Z23 Encounter for immunization: Secondary | ICD-10-CM | POA: Diagnosis not present

## 2018-05-11 DIAGNOSIS — Z79899 Other long term (current) drug therapy: Secondary | ICD-10-CM | POA: Diagnosis not present

## 2018-05-11 DIAGNOSIS — J441 Chronic obstructive pulmonary disease with (acute) exacerbation: Secondary | ICD-10-CM | POA: Diagnosis not present

## 2018-05-11 DIAGNOSIS — K219 Gastro-esophageal reflux disease without esophagitis: Secondary | ICD-10-CM | POA: Diagnosis not present

## 2018-05-11 DIAGNOSIS — J189 Pneumonia, unspecified organism: Secondary | ICD-10-CM | POA: Diagnosis not present

## 2018-05-11 DIAGNOSIS — Z7982 Long term (current) use of aspirin: Secondary | ICD-10-CM | POA: Diagnosis not present

## 2018-05-30 DIAGNOSIS — J180 Bronchopneumonia, unspecified organism: Secondary | ICD-10-CM | POA: Diagnosis not present

## 2018-05-30 DIAGNOSIS — J449 Chronic obstructive pulmonary disease, unspecified: Secondary | ICD-10-CM | POA: Diagnosis not present

## 2018-05-30 DIAGNOSIS — K219 Gastro-esophageal reflux disease without esophagitis: Secondary | ICD-10-CM | POA: Diagnosis not present

## 2018-05-30 DIAGNOSIS — E785 Hyperlipidemia, unspecified: Secondary | ICD-10-CM | POA: Diagnosis not present

## 2018-06-30 DIAGNOSIS — K219 Gastro-esophageal reflux disease without esophagitis: Secondary | ICD-10-CM | POA: Diagnosis not present

## 2018-06-30 DIAGNOSIS — J180 Bronchopneumonia, unspecified organism: Secondary | ICD-10-CM | POA: Diagnosis not present

## 2018-06-30 DIAGNOSIS — E785 Hyperlipidemia, unspecified: Secondary | ICD-10-CM | POA: Diagnosis not present

## 2018-06-30 DIAGNOSIS — J449 Chronic obstructive pulmonary disease, unspecified: Secondary | ICD-10-CM | POA: Diagnosis not present

## 2018-07-30 DIAGNOSIS — J180 Bronchopneumonia, unspecified organism: Secondary | ICD-10-CM | POA: Diagnosis not present

## 2018-07-30 DIAGNOSIS — E785 Hyperlipidemia, unspecified: Secondary | ICD-10-CM | POA: Diagnosis not present

## 2018-07-30 DIAGNOSIS — J449 Chronic obstructive pulmonary disease, unspecified: Secondary | ICD-10-CM | POA: Diagnosis not present

## 2018-07-30 DIAGNOSIS — K219 Gastro-esophageal reflux disease without esophagitis: Secondary | ICD-10-CM | POA: Diagnosis not present

## 2018-08-30 DIAGNOSIS — E785 Hyperlipidemia, unspecified: Secondary | ICD-10-CM | POA: Diagnosis not present

## 2018-08-30 DIAGNOSIS — K219 Gastro-esophageal reflux disease without esophagitis: Secondary | ICD-10-CM | POA: Diagnosis not present

## 2018-08-30 DIAGNOSIS — J449 Chronic obstructive pulmonary disease, unspecified: Secondary | ICD-10-CM | POA: Diagnosis not present

## 2018-08-30 DIAGNOSIS — J180 Bronchopneumonia, unspecified organism: Secondary | ICD-10-CM | POA: Diagnosis not present

## 2018-08-31 DIAGNOSIS — S42202A Unspecified fracture of upper end of left humerus, initial encounter for closed fracture: Secondary | ICD-10-CM | POA: Diagnosis not present

## 2018-08-31 DIAGNOSIS — S0990XA Unspecified injury of head, initial encounter: Secondary | ICD-10-CM | POA: Diagnosis not present

## 2018-08-31 DIAGNOSIS — S42292A Other displaced fracture of upper end of left humerus, initial encounter for closed fracture: Secondary | ICD-10-CM | POA: Diagnosis not present

## 2018-08-31 DIAGNOSIS — S199XXA Unspecified injury of neck, initial encounter: Secondary | ICD-10-CM | POA: Diagnosis not present

## 2018-08-31 DIAGNOSIS — S01112A Laceration without foreign body of left eyelid and periocular area, initial encounter: Secondary | ICD-10-CM | POA: Diagnosis not present

## 2018-08-31 DIAGNOSIS — R9082 White matter disease, unspecified: Secondary | ICD-10-CM | POA: Diagnosis not present

## 2018-08-31 DIAGNOSIS — W1839XA Other fall on same level, initial encounter: Secondary | ICD-10-CM | POA: Diagnosis not present

## 2018-09-09 DIAGNOSIS — W19XXXA Unspecified fall, initial encounter: Secondary | ICD-10-CM | POA: Diagnosis not present

## 2018-09-09 DIAGNOSIS — S42212A Unspecified displaced fracture of surgical neck of left humerus, initial encounter for closed fracture: Secondary | ICD-10-CM | POA: Diagnosis not present

## 2018-09-30 DIAGNOSIS — J449 Chronic obstructive pulmonary disease, unspecified: Secondary | ICD-10-CM | POA: Diagnosis not present

## 2018-09-30 DIAGNOSIS — E785 Hyperlipidemia, unspecified: Secondary | ICD-10-CM | POA: Diagnosis not present

## 2018-09-30 DIAGNOSIS — J180 Bronchopneumonia, unspecified organism: Secondary | ICD-10-CM | POA: Diagnosis not present

## 2018-09-30 DIAGNOSIS — K219 Gastro-esophageal reflux disease without esophagitis: Secondary | ICD-10-CM | POA: Diagnosis not present

## 2018-10-09 DIAGNOSIS — S42212D Unspecified displaced fracture of surgical neck of left humerus, subsequent encounter for fracture with routine healing: Secondary | ICD-10-CM | POA: Diagnosis not present

## 2018-10-29 DIAGNOSIS — J449 Chronic obstructive pulmonary disease, unspecified: Secondary | ICD-10-CM | POA: Diagnosis not present

## 2018-10-29 DIAGNOSIS — E785 Hyperlipidemia, unspecified: Secondary | ICD-10-CM | POA: Diagnosis not present

## 2018-10-29 DIAGNOSIS — J180 Bronchopneumonia, unspecified organism: Secondary | ICD-10-CM | POA: Diagnosis not present

## 2018-10-29 DIAGNOSIS — K219 Gastro-esophageal reflux disease without esophagitis: Secondary | ICD-10-CM | POA: Diagnosis not present

## 2018-11-29 DIAGNOSIS — J449 Chronic obstructive pulmonary disease, unspecified: Secondary | ICD-10-CM | POA: Diagnosis not present

## 2018-11-29 DIAGNOSIS — J180 Bronchopneumonia, unspecified organism: Secondary | ICD-10-CM | POA: Diagnosis not present

## 2018-11-29 DIAGNOSIS — K219 Gastro-esophageal reflux disease without esophagitis: Secondary | ICD-10-CM | POA: Diagnosis not present

## 2018-11-29 DIAGNOSIS — E785 Hyperlipidemia, unspecified: Secondary | ICD-10-CM | POA: Diagnosis not present

## 2018-12-29 DIAGNOSIS — E785 Hyperlipidemia, unspecified: Secondary | ICD-10-CM | POA: Diagnosis not present

## 2018-12-29 DIAGNOSIS — J449 Chronic obstructive pulmonary disease, unspecified: Secondary | ICD-10-CM | POA: Diagnosis not present

## 2018-12-29 DIAGNOSIS — J180 Bronchopneumonia, unspecified organism: Secondary | ICD-10-CM | POA: Diagnosis not present

## 2018-12-29 DIAGNOSIS — K219 Gastro-esophageal reflux disease without esophagitis: Secondary | ICD-10-CM | POA: Diagnosis not present

## 2019-01-29 DIAGNOSIS — J180 Bronchopneumonia, unspecified organism: Secondary | ICD-10-CM | POA: Diagnosis not present

## 2019-01-29 DIAGNOSIS — E785 Hyperlipidemia, unspecified: Secondary | ICD-10-CM | POA: Diagnosis not present

## 2019-01-29 DIAGNOSIS — K219 Gastro-esophageal reflux disease without esophagitis: Secondary | ICD-10-CM | POA: Diagnosis not present

## 2019-01-29 DIAGNOSIS — J449 Chronic obstructive pulmonary disease, unspecified: Secondary | ICD-10-CM | POA: Diagnosis not present

## 2019-02-28 DIAGNOSIS — I10 Essential (primary) hypertension: Secondary | ICD-10-CM | POA: Diagnosis not present

## 2019-02-28 DIAGNOSIS — K219 Gastro-esophageal reflux disease without esophagitis: Secondary | ICD-10-CM | POA: Diagnosis not present

## 2019-02-28 DIAGNOSIS — G4700 Insomnia, unspecified: Secondary | ICD-10-CM | POA: Diagnosis not present

## 2019-02-28 DIAGNOSIS — J180 Bronchopneumonia, unspecified organism: Secondary | ICD-10-CM | POA: Diagnosis not present

## 2019-02-28 DIAGNOSIS — E785 Hyperlipidemia, unspecified: Secondary | ICD-10-CM | POA: Diagnosis not present

## 2019-02-28 DIAGNOSIS — J449 Chronic obstructive pulmonary disease, unspecified: Secondary | ICD-10-CM | POA: Diagnosis not present

## 2019-03-05 ENCOUNTER — Other Ambulatory Visit: Payer: Self-pay

## 2019-03-05 NOTE — Patient Outreach (Signed)
[facility] Care Management  03/05/2019  JENETTA WEASE 1951/05/22 [id number]  Medication Adherence call to Mrs. Dossie Arbour patient has a disconnected telephone number patient is showing past due under [facility].

## 2019-03-31 DIAGNOSIS — K219 Gastro-esophageal reflux disease without esophagitis: Secondary | ICD-10-CM | POA: Diagnosis not present

## 2019-03-31 DIAGNOSIS — J449 Chronic obstructive pulmonary disease, unspecified: Secondary | ICD-10-CM | POA: Diagnosis not present

## 2019-03-31 DIAGNOSIS — E785 Hyperlipidemia, unspecified: Secondary | ICD-10-CM | POA: Diagnosis not present

## 2019-03-31 DIAGNOSIS — J180 Bronchopneumonia, unspecified organism: Secondary | ICD-10-CM | POA: Diagnosis not present

## 2019-05-01 DIAGNOSIS — E785 Hyperlipidemia, unspecified: Secondary | ICD-10-CM | POA: Diagnosis not present

## 2019-05-01 DIAGNOSIS — J449 Chronic obstructive pulmonary disease, unspecified: Secondary | ICD-10-CM | POA: Diagnosis not present

## 2019-05-01 DIAGNOSIS — J180 Bronchopneumonia, unspecified organism: Secondary | ICD-10-CM | POA: Diagnosis not present

## 2019-05-01 DIAGNOSIS — K219 Gastro-esophageal reflux disease without esophagitis: Secondary | ICD-10-CM | POA: Diagnosis not present

## 2019-05-31 DIAGNOSIS — J180 Bronchopneumonia, unspecified organism: Secondary | ICD-10-CM | POA: Diagnosis not present

## 2019-05-31 DIAGNOSIS — J449 Chronic obstructive pulmonary disease, unspecified: Secondary | ICD-10-CM | POA: Diagnosis not present

## 2019-05-31 DIAGNOSIS — E785 Hyperlipidemia, unspecified: Secondary | ICD-10-CM | POA: Diagnosis not present

## 2019-05-31 DIAGNOSIS — K219 Gastro-esophageal reflux disease without esophagitis: Secondary | ICD-10-CM | POA: Diagnosis not present

## 2019-07-01 DIAGNOSIS — J180 Bronchopneumonia, unspecified organism: Secondary | ICD-10-CM | POA: Diagnosis not present

## 2019-07-01 DIAGNOSIS — K219 Gastro-esophageal reflux disease without esophagitis: Secondary | ICD-10-CM | POA: Diagnosis not present

## 2019-07-01 DIAGNOSIS — E785 Hyperlipidemia, unspecified: Secondary | ICD-10-CM | POA: Diagnosis not present

## 2019-07-01 DIAGNOSIS — J449 Chronic obstructive pulmonary disease, unspecified: Secondary | ICD-10-CM | POA: Diagnosis not present

## 2019-07-31 DIAGNOSIS — J180 Bronchopneumonia, unspecified organism: Secondary | ICD-10-CM | POA: Diagnosis not present

## 2019-07-31 DIAGNOSIS — E785 Hyperlipidemia, unspecified: Secondary | ICD-10-CM | POA: Diagnosis not present

## 2019-07-31 DIAGNOSIS — K219 Gastro-esophageal reflux disease without esophagitis: Secondary | ICD-10-CM | POA: Diagnosis not present

## 2019-07-31 DIAGNOSIS — J449 Chronic obstructive pulmonary disease, unspecified: Secondary | ICD-10-CM | POA: Diagnosis not present

## 2019-08-31 DIAGNOSIS — J449 Chronic obstructive pulmonary disease, unspecified: Secondary | ICD-10-CM | POA: Diagnosis not present

## 2019-08-31 DIAGNOSIS — E785 Hyperlipidemia, unspecified: Secondary | ICD-10-CM | POA: Diagnosis not present

## 2019-08-31 DIAGNOSIS — K219 Gastro-esophageal reflux disease without esophagitis: Secondary | ICD-10-CM | POA: Diagnosis not present

## 2019-08-31 DIAGNOSIS — J180 Bronchopneumonia, unspecified organism: Secondary | ICD-10-CM | POA: Diagnosis not present

## 2019-09-01 DIAGNOSIS — Z20822 Contact with and (suspected) exposure to covid-19: Secondary | ICD-10-CM | POA: Diagnosis not present

## 2019-09-01 DIAGNOSIS — J441 Chronic obstructive pulmonary disease with (acute) exacerbation: Secondary | ICD-10-CM | POA: Diagnosis not present

## 2019-09-15 DIAGNOSIS — I10 Essential (primary) hypertension: Secondary | ICD-10-CM | POA: Diagnosis not present

## 2019-09-15 DIAGNOSIS — J449 Chronic obstructive pulmonary disease, unspecified: Secondary | ICD-10-CM | POA: Diagnosis not present

## 2019-09-15 DIAGNOSIS — E785 Hyperlipidemia, unspecified: Secondary | ICD-10-CM | POA: Diagnosis not present

## 2019-09-15 DIAGNOSIS — G4700 Insomnia, unspecified: Secondary | ICD-10-CM | POA: Diagnosis not present

## 2019-10-01 DIAGNOSIS — E785 Hyperlipidemia, unspecified: Secondary | ICD-10-CM | POA: Diagnosis not present

## 2019-10-01 DIAGNOSIS — K219 Gastro-esophageal reflux disease without esophagitis: Secondary | ICD-10-CM | POA: Diagnosis not present

## 2019-10-01 DIAGNOSIS — J449 Chronic obstructive pulmonary disease, unspecified: Secondary | ICD-10-CM | POA: Diagnosis not present

## 2019-10-01 DIAGNOSIS — J180 Bronchopneumonia, unspecified organism: Secondary | ICD-10-CM | POA: Diagnosis not present

## 2019-10-29 DIAGNOSIS — E785 Hyperlipidemia, unspecified: Secondary | ICD-10-CM | POA: Diagnosis not present

## 2019-10-29 DIAGNOSIS — J449 Chronic obstructive pulmonary disease, unspecified: Secondary | ICD-10-CM | POA: Diagnosis not present

## 2019-10-29 DIAGNOSIS — K219 Gastro-esophageal reflux disease without esophagitis: Secondary | ICD-10-CM | POA: Diagnosis not present

## 2019-11-29 DIAGNOSIS — J449 Chronic obstructive pulmonary disease, unspecified: Secondary | ICD-10-CM | POA: Diagnosis not present

## 2019-11-29 DIAGNOSIS — E785 Hyperlipidemia, unspecified: Secondary | ICD-10-CM | POA: Diagnosis not present

## 2019-11-29 DIAGNOSIS — J180 Bronchopneumonia, unspecified organism: Secondary | ICD-10-CM | POA: Diagnosis not present

## 2019-11-29 DIAGNOSIS — K219 Gastro-esophageal reflux disease without esophagitis: Secondary | ICD-10-CM | POA: Diagnosis not present

## 2019-12-29 DIAGNOSIS — K219 Gastro-esophageal reflux disease without esophagitis: Secondary | ICD-10-CM | POA: Diagnosis not present

## 2019-12-29 DIAGNOSIS — E785 Hyperlipidemia, unspecified: Secondary | ICD-10-CM | POA: Diagnosis not present

## 2019-12-29 DIAGNOSIS — J449 Chronic obstructive pulmonary disease, unspecified: Secondary | ICD-10-CM | POA: Diagnosis not present

## 2019-12-29 DIAGNOSIS — J180 Bronchopneumonia, unspecified organism: Secondary | ICD-10-CM | POA: Diagnosis not present

## 2020-01-29 DIAGNOSIS — J180 Bronchopneumonia, unspecified organism: Secondary | ICD-10-CM | POA: Diagnosis not present

## 2020-01-29 DIAGNOSIS — J449 Chronic obstructive pulmonary disease, unspecified: Secondary | ICD-10-CM | POA: Diagnosis not present

## 2020-01-29 DIAGNOSIS — K219 Gastro-esophageal reflux disease without esophagitis: Secondary | ICD-10-CM | POA: Diagnosis not present

## 2020-01-29 DIAGNOSIS — E785 Hyperlipidemia, unspecified: Secondary | ICD-10-CM | POA: Diagnosis not present

## 2020-02-28 DIAGNOSIS — K219 Gastro-esophageal reflux disease without esophagitis: Secondary | ICD-10-CM | POA: Diagnosis not present

## 2020-02-28 DIAGNOSIS — E785 Hyperlipidemia, unspecified: Secondary | ICD-10-CM | POA: Diagnosis not present

## 2020-02-28 DIAGNOSIS — J180 Bronchopneumonia, unspecified organism: Secondary | ICD-10-CM | POA: Diagnosis not present

## 2020-02-28 DIAGNOSIS — J449 Chronic obstructive pulmonary disease, unspecified: Secondary | ICD-10-CM | POA: Diagnosis not present

## 2020-03-15 DIAGNOSIS — Z139 Encounter for screening, unspecified: Secondary | ICD-10-CM | POA: Diagnosis not present

## 2020-03-15 DIAGNOSIS — J449 Chronic obstructive pulmonary disease, unspecified: Secondary | ICD-10-CM | POA: Diagnosis not present

## 2020-03-15 DIAGNOSIS — G4700 Insomnia, unspecified: Secondary | ICD-10-CM | POA: Diagnosis not present

## 2020-03-15 DIAGNOSIS — E785 Hyperlipidemia, unspecified: Secondary | ICD-10-CM | POA: Diagnosis not present

## 2020-03-15 DIAGNOSIS — I10 Essential (primary) hypertension: Secondary | ICD-10-CM | POA: Diagnosis not present

## 2020-03-15 DIAGNOSIS — Z9181 History of falling: Secondary | ICD-10-CM | POA: Diagnosis not present

## 2020-03-30 DIAGNOSIS — J449 Chronic obstructive pulmonary disease, unspecified: Secondary | ICD-10-CM | POA: Diagnosis not present

## 2020-03-30 DIAGNOSIS — J180 Bronchopneumonia, unspecified organism: Secondary | ICD-10-CM | POA: Diagnosis not present

## 2020-03-30 DIAGNOSIS — E785 Hyperlipidemia, unspecified: Secondary | ICD-10-CM | POA: Diagnosis not present

## 2020-03-30 DIAGNOSIS — K219 Gastro-esophageal reflux disease without esophagitis: Secondary | ICD-10-CM | POA: Diagnosis not present

## 2020-04-30 DIAGNOSIS — J449 Chronic obstructive pulmonary disease, unspecified: Secondary | ICD-10-CM | POA: Diagnosis not present

## 2020-04-30 DIAGNOSIS — K219 Gastro-esophageal reflux disease without esophagitis: Secondary | ICD-10-CM | POA: Diagnosis not present

## 2020-04-30 DIAGNOSIS — E785 Hyperlipidemia, unspecified: Secondary | ICD-10-CM | POA: Diagnosis not present

## 2020-05-30 DIAGNOSIS — E785 Hyperlipidemia, unspecified: Secondary | ICD-10-CM | POA: Diagnosis not present

## 2020-05-30 DIAGNOSIS — K219 Gastro-esophageal reflux disease without esophagitis: Secondary | ICD-10-CM | POA: Diagnosis not present

## 2020-05-30 DIAGNOSIS — J449 Chronic obstructive pulmonary disease, unspecified: Secondary | ICD-10-CM | POA: Diagnosis not present

## 2020-06-30 DIAGNOSIS — J449 Chronic obstructive pulmonary disease, unspecified: Secondary | ICD-10-CM | POA: Diagnosis not present

## 2020-06-30 DIAGNOSIS — K219 Gastro-esophageal reflux disease without esophagitis: Secondary | ICD-10-CM | POA: Diagnosis not present

## 2020-06-30 DIAGNOSIS — E785 Hyperlipidemia, unspecified: Secondary | ICD-10-CM | POA: Diagnosis not present

## 2020-07-30 DIAGNOSIS — K219 Gastro-esophageal reflux disease without esophagitis: Secondary | ICD-10-CM | POA: Diagnosis not present

## 2020-07-30 DIAGNOSIS — J449 Chronic obstructive pulmonary disease, unspecified: Secondary | ICD-10-CM | POA: Diagnosis not present

## 2020-07-30 DIAGNOSIS — E785 Hyperlipidemia, unspecified: Secondary | ICD-10-CM | POA: Diagnosis not present

## 2020-08-30 DIAGNOSIS — J449 Chronic obstructive pulmonary disease, unspecified: Secondary | ICD-10-CM | POA: Diagnosis not present

## 2020-08-30 DIAGNOSIS — K219 Gastro-esophageal reflux disease without esophagitis: Secondary | ICD-10-CM | POA: Diagnosis not present

## 2020-08-30 DIAGNOSIS — E785 Hyperlipidemia, unspecified: Secondary | ICD-10-CM | POA: Diagnosis not present

## 2020-09-16 DIAGNOSIS — J449 Chronic obstructive pulmonary disease, unspecified: Secondary | ICD-10-CM | POA: Diagnosis not present

## 2020-09-16 DIAGNOSIS — Z9181 History of falling: Secondary | ICD-10-CM | POA: Diagnosis not present

## 2020-09-16 DIAGNOSIS — I10 Essential (primary) hypertension: Secondary | ICD-10-CM | POA: Diagnosis not present

## 2020-09-16 DIAGNOSIS — Z139 Encounter for screening, unspecified: Secondary | ICD-10-CM | POA: Diagnosis not present

## 2020-09-16 DIAGNOSIS — Z23 Encounter for immunization: Secondary | ICD-10-CM | POA: Diagnosis not present

## 2020-09-16 DIAGNOSIS — E785 Hyperlipidemia, unspecified: Secondary | ICD-10-CM | POA: Diagnosis not present

## 2020-09-16 DIAGNOSIS — G4700 Insomnia, unspecified: Secondary | ICD-10-CM | POA: Diagnosis not present

## 2020-09-30 DIAGNOSIS — E785 Hyperlipidemia, unspecified: Secondary | ICD-10-CM | POA: Diagnosis not present

## 2020-09-30 DIAGNOSIS — K219 Gastro-esophageal reflux disease without esophagitis: Secondary | ICD-10-CM | POA: Diagnosis not present

## 2020-09-30 DIAGNOSIS — J449 Chronic obstructive pulmonary disease, unspecified: Secondary | ICD-10-CM | POA: Diagnosis not present

## 2020-10-28 DIAGNOSIS — J449 Chronic obstructive pulmonary disease, unspecified: Secondary | ICD-10-CM | POA: Diagnosis not present

## 2020-10-28 DIAGNOSIS — E785 Hyperlipidemia, unspecified: Secondary | ICD-10-CM | POA: Diagnosis not present

## 2020-10-28 DIAGNOSIS — K219 Gastro-esophageal reflux disease without esophagitis: Secondary | ICD-10-CM | POA: Diagnosis not present

## 2020-11-28 DIAGNOSIS — J449 Chronic obstructive pulmonary disease, unspecified: Secondary | ICD-10-CM | POA: Diagnosis not present

## 2020-11-28 DIAGNOSIS — E785 Hyperlipidemia, unspecified: Secondary | ICD-10-CM | POA: Diagnosis not present

## 2020-11-28 DIAGNOSIS — K219 Gastro-esophageal reflux disease without esophagitis: Secondary | ICD-10-CM | POA: Diagnosis not present

## 2020-12-28 DIAGNOSIS — K219 Gastro-esophageal reflux disease without esophagitis: Secondary | ICD-10-CM | POA: Diagnosis not present

## 2020-12-28 DIAGNOSIS — J449 Chronic obstructive pulmonary disease, unspecified: Secondary | ICD-10-CM | POA: Diagnosis not present

## 2020-12-28 DIAGNOSIS — E785 Hyperlipidemia, unspecified: Secondary | ICD-10-CM | POA: Diagnosis not present

## 2021-01-28 DIAGNOSIS — K219 Gastro-esophageal reflux disease without esophagitis: Secondary | ICD-10-CM | POA: Diagnosis not present

## 2021-01-28 DIAGNOSIS — E785 Hyperlipidemia, unspecified: Secondary | ICD-10-CM | POA: Diagnosis not present

## 2021-01-28 DIAGNOSIS — J449 Chronic obstructive pulmonary disease, unspecified: Secondary | ICD-10-CM | POA: Diagnosis not present

## 2021-02-27 DIAGNOSIS — K219 Gastro-esophageal reflux disease without esophagitis: Secondary | ICD-10-CM | POA: Diagnosis not present

## 2021-02-27 DIAGNOSIS — E785 Hyperlipidemia, unspecified: Secondary | ICD-10-CM | POA: Diagnosis not present

## 2021-02-27 DIAGNOSIS — J449 Chronic obstructive pulmonary disease, unspecified: Secondary | ICD-10-CM | POA: Diagnosis not present

## 2021-03-29 DIAGNOSIS — G4700 Insomnia, unspecified: Secondary | ICD-10-CM | POA: Diagnosis not present

## 2021-03-29 DIAGNOSIS — E785 Hyperlipidemia, unspecified: Secondary | ICD-10-CM | POA: Diagnosis not present

## 2021-03-29 DIAGNOSIS — J449 Chronic obstructive pulmonary disease, unspecified: Secondary | ICD-10-CM | POA: Diagnosis not present

## 2021-03-29 DIAGNOSIS — I10 Essential (primary) hypertension: Secondary | ICD-10-CM | POA: Diagnosis not present

## 2021-03-30 DIAGNOSIS — K219 Gastro-esophageal reflux disease without esophagitis: Secondary | ICD-10-CM | POA: Diagnosis not present

## 2021-03-30 DIAGNOSIS — J449 Chronic obstructive pulmonary disease, unspecified: Secondary | ICD-10-CM | POA: Diagnosis not present

## 2021-03-30 DIAGNOSIS — E785 Hyperlipidemia, unspecified: Secondary | ICD-10-CM | POA: Diagnosis not present

## 2021-04-11 DIAGNOSIS — R739 Hyperglycemia, unspecified: Secondary | ICD-10-CM | POA: Diagnosis not present

## 2021-04-30 DIAGNOSIS — E785 Hyperlipidemia, unspecified: Secondary | ICD-10-CM | POA: Diagnosis not present

## 2021-04-30 DIAGNOSIS — K219 Gastro-esophageal reflux disease without esophagitis: Secondary | ICD-10-CM | POA: Diagnosis not present

## 2021-04-30 DIAGNOSIS — J449 Chronic obstructive pulmonary disease, unspecified: Secondary | ICD-10-CM | POA: Diagnosis not present

## 2021-05-06 DIAGNOSIS — J441 Chronic obstructive pulmonary disease with (acute) exacerbation: Secondary | ICD-10-CM | POA: Diagnosis not present

## 2021-05-30 DIAGNOSIS — J449 Chronic obstructive pulmonary disease, unspecified: Secondary | ICD-10-CM | POA: Diagnosis not present

## 2021-05-30 DIAGNOSIS — K219 Gastro-esophageal reflux disease without esophagitis: Secondary | ICD-10-CM | POA: Diagnosis not present

## 2021-05-30 DIAGNOSIS — E785 Hyperlipidemia, unspecified: Secondary | ICD-10-CM | POA: Diagnosis not present

## 2021-06-30 DIAGNOSIS — J449 Chronic obstructive pulmonary disease, unspecified: Secondary | ICD-10-CM | POA: Diagnosis not present

## 2021-06-30 DIAGNOSIS — E785 Hyperlipidemia, unspecified: Secondary | ICD-10-CM | POA: Diagnosis not present

## 2021-06-30 DIAGNOSIS — K219 Gastro-esophageal reflux disease without esophagitis: Secondary | ICD-10-CM | POA: Diagnosis not present

## 2021-07-25 DIAGNOSIS — J449 Chronic obstructive pulmonary disease, unspecified: Secondary | ICD-10-CM | POA: Diagnosis not present

## 2021-07-25 DIAGNOSIS — E785 Hyperlipidemia, unspecified: Secondary | ICD-10-CM | POA: Diagnosis not present

## 2021-07-25 DIAGNOSIS — E1165 Type 2 diabetes mellitus with hyperglycemia: Secondary | ICD-10-CM | POA: Diagnosis not present

## 2021-07-25 DIAGNOSIS — G4700 Insomnia, unspecified: Secondary | ICD-10-CM | POA: Diagnosis not present

## 2021-07-25 DIAGNOSIS — Z79899 Other long term (current) drug therapy: Secondary | ICD-10-CM | POA: Diagnosis not present

## 2021-07-25 DIAGNOSIS — Z1211 Encounter for screening for malignant neoplasm of colon: Secondary | ICD-10-CM | POA: Diagnosis not present

## 2021-07-25 DIAGNOSIS — I10 Essential (primary) hypertension: Secondary | ICD-10-CM | POA: Diagnosis not present

## 2021-07-30 DIAGNOSIS — K219 Gastro-esophageal reflux disease without esophagitis: Secondary | ICD-10-CM | POA: Diagnosis not present

## 2021-07-30 DIAGNOSIS — J449 Chronic obstructive pulmonary disease, unspecified: Secondary | ICD-10-CM | POA: Diagnosis not present

## 2021-07-30 DIAGNOSIS — E785 Hyperlipidemia, unspecified: Secondary | ICD-10-CM | POA: Diagnosis not present

## 2021-08-16 DIAGNOSIS — I10 Essential (primary) hypertension: Secondary | ICD-10-CM | POA: Diagnosis not present

## 2021-08-24 DIAGNOSIS — Z1211 Encounter for screening for malignant neoplasm of colon: Secondary | ICD-10-CM | POA: Diagnosis not present

## 2021-08-24 DIAGNOSIS — Z1212 Encounter for screening for malignant neoplasm of rectum: Secondary | ICD-10-CM | POA: Diagnosis not present

## 2021-08-30 DIAGNOSIS — K219 Gastro-esophageal reflux disease without esophagitis: Secondary | ICD-10-CM | POA: Diagnosis not present

## 2021-08-30 DIAGNOSIS — E785 Hyperlipidemia, unspecified: Secondary | ICD-10-CM | POA: Diagnosis not present

## 2021-08-30 DIAGNOSIS — J449 Chronic obstructive pulmonary disease, unspecified: Secondary | ICD-10-CM | POA: Diagnosis not present

## 2021-09-30 DIAGNOSIS — K219 Gastro-esophageal reflux disease without esophagitis: Secondary | ICD-10-CM | POA: Diagnosis not present

## 2021-09-30 DIAGNOSIS — J449 Chronic obstructive pulmonary disease, unspecified: Secondary | ICD-10-CM | POA: Diagnosis not present

## 2021-09-30 DIAGNOSIS — E785 Hyperlipidemia, unspecified: Secondary | ICD-10-CM | POA: Diagnosis not present

## 2021-10-28 DIAGNOSIS — J449 Chronic obstructive pulmonary disease, unspecified: Secondary | ICD-10-CM | POA: Diagnosis not present

## 2021-10-28 DIAGNOSIS — E785 Hyperlipidemia, unspecified: Secondary | ICD-10-CM | POA: Diagnosis not present

## 2021-10-28 DIAGNOSIS — K219 Gastro-esophageal reflux disease without esophagitis: Secondary | ICD-10-CM | POA: Diagnosis not present

## 2021-11-14 DIAGNOSIS — R6889 Other general symptoms and signs: Secondary | ICD-10-CM | POA: Diagnosis not present

## 2021-11-14 DIAGNOSIS — U071 COVID-19: Secondary | ICD-10-CM | POA: Diagnosis not present

## 2021-11-28 DIAGNOSIS — E785 Hyperlipidemia, unspecified: Secondary | ICD-10-CM | POA: Diagnosis not present

## 2021-11-28 DIAGNOSIS — K219 Gastro-esophageal reflux disease without esophagitis: Secondary | ICD-10-CM | POA: Diagnosis not present

## 2021-11-28 DIAGNOSIS — J449 Chronic obstructive pulmonary disease, unspecified: Secondary | ICD-10-CM | POA: Diagnosis not present

## 2021-12-28 DIAGNOSIS — E785 Hyperlipidemia, unspecified: Secondary | ICD-10-CM | POA: Diagnosis not present

## 2021-12-28 DIAGNOSIS — K219 Gastro-esophageal reflux disease without esophagitis: Secondary | ICD-10-CM | POA: Diagnosis not present

## 2021-12-28 DIAGNOSIS — J449 Chronic obstructive pulmonary disease, unspecified: Secondary | ICD-10-CM | POA: Diagnosis not present

## 2022-01-28 DIAGNOSIS — K219 Gastro-esophageal reflux disease without esophagitis: Secondary | ICD-10-CM | POA: Diagnosis not present

## 2022-01-28 DIAGNOSIS — E785 Hyperlipidemia, unspecified: Secondary | ICD-10-CM | POA: Diagnosis not present

## 2022-01-28 DIAGNOSIS — J449 Chronic obstructive pulmonary disease, unspecified: Secondary | ICD-10-CM | POA: Diagnosis not present

## 2022-01-31 DIAGNOSIS — E1165 Type 2 diabetes mellitus with hyperglycemia: Secondary | ICD-10-CM | POA: Diagnosis not present

## 2022-01-31 DIAGNOSIS — E785 Hyperlipidemia, unspecified: Secondary | ICD-10-CM | POA: Diagnosis not present

## 2022-02-02 DIAGNOSIS — Z139 Encounter for screening, unspecified: Secondary | ICD-10-CM | POA: Diagnosis not present

## 2022-02-02 DIAGNOSIS — J449 Chronic obstructive pulmonary disease, unspecified: Secondary | ICD-10-CM | POA: Diagnosis not present

## 2022-02-02 DIAGNOSIS — G4700 Insomnia, unspecified: Secondary | ICD-10-CM | POA: Diagnosis not present

## 2022-02-02 DIAGNOSIS — I10 Essential (primary) hypertension: Secondary | ICD-10-CM | POA: Diagnosis not present

## 2022-02-02 DIAGNOSIS — E785 Hyperlipidemia, unspecified: Secondary | ICD-10-CM | POA: Diagnosis not present

## 2022-02-02 DIAGNOSIS — L508 Other urticaria: Secondary | ICD-10-CM | POA: Diagnosis not present

## 2022-02-02 DIAGNOSIS — E1165 Type 2 diabetes mellitus with hyperglycemia: Secondary | ICD-10-CM | POA: Diagnosis not present

## 2022-02-23 DIAGNOSIS — Z0000 Encounter for general adult medical examination without abnormal findings: Secondary | ICD-10-CM | POA: Diagnosis not present

## 2022-02-23 DIAGNOSIS — Z9181 History of falling: Secondary | ICD-10-CM | POA: Diagnosis not present

## 2022-02-23 DIAGNOSIS — E785 Hyperlipidemia, unspecified: Secondary | ICD-10-CM | POA: Diagnosis not present

## 2022-02-27 DIAGNOSIS — K219 Gastro-esophageal reflux disease without esophagitis: Secondary | ICD-10-CM | POA: Diagnosis not present

## 2022-02-27 DIAGNOSIS — E785 Hyperlipidemia, unspecified: Secondary | ICD-10-CM | POA: Diagnosis not present

## 2022-02-27 DIAGNOSIS — J449 Chronic obstructive pulmonary disease, unspecified: Secondary | ICD-10-CM | POA: Diagnosis not present

## 2022-03-30 DIAGNOSIS — E785 Hyperlipidemia, unspecified: Secondary | ICD-10-CM | POA: Diagnosis not present

## 2022-03-30 DIAGNOSIS — J449 Chronic obstructive pulmonary disease, unspecified: Secondary | ICD-10-CM | POA: Diagnosis not present

## 2022-03-30 DIAGNOSIS — K219 Gastro-esophageal reflux disease without esophagitis: Secondary | ICD-10-CM | POA: Diagnosis not present

## 2022-04-30 DIAGNOSIS — K219 Gastro-esophageal reflux disease without esophagitis: Secondary | ICD-10-CM | POA: Diagnosis not present

## 2022-04-30 DIAGNOSIS — E785 Hyperlipidemia, unspecified: Secondary | ICD-10-CM | POA: Diagnosis not present

## 2022-04-30 DIAGNOSIS — J449 Chronic obstructive pulmonary disease, unspecified: Secondary | ICD-10-CM | POA: Diagnosis not present

## 2022-05-10 DIAGNOSIS — E785 Hyperlipidemia, unspecified: Secondary | ICD-10-CM | POA: Diagnosis not present

## 2022-05-10 DIAGNOSIS — E1169 Type 2 diabetes mellitus with other specified complication: Secondary | ICD-10-CM | POA: Diagnosis not present

## 2022-05-10 DIAGNOSIS — Z23 Encounter for immunization: Secondary | ICD-10-CM | POA: Diagnosis not present

## 2022-05-30 DIAGNOSIS — J449 Chronic obstructive pulmonary disease, unspecified: Secondary | ICD-10-CM | POA: Diagnosis not present

## 2022-05-30 DIAGNOSIS — E785 Hyperlipidemia, unspecified: Secondary | ICD-10-CM | POA: Diagnosis not present

## 2022-05-30 DIAGNOSIS — K219 Gastro-esophageal reflux disease without esophagitis: Secondary | ICD-10-CM | POA: Diagnosis not present

## 2022-06-30 DIAGNOSIS — K219 Gastro-esophageal reflux disease without esophagitis: Secondary | ICD-10-CM | POA: Diagnosis not present

## 2022-06-30 DIAGNOSIS — E785 Hyperlipidemia, unspecified: Secondary | ICD-10-CM | POA: Diagnosis not present

## 2022-06-30 DIAGNOSIS — J449 Chronic obstructive pulmonary disease, unspecified: Secondary | ICD-10-CM | POA: Diagnosis not present

## 2022-07-30 DIAGNOSIS — E785 Hyperlipidemia, unspecified: Secondary | ICD-10-CM | POA: Diagnosis not present

## 2022-07-30 DIAGNOSIS — K219 Gastro-esophageal reflux disease without esophagitis: Secondary | ICD-10-CM | POA: Diagnosis not present

## 2022-07-30 DIAGNOSIS — J449 Chronic obstructive pulmonary disease, unspecified: Secondary | ICD-10-CM | POA: Diagnosis not present

## 2022-08-07 DIAGNOSIS — E1169 Type 2 diabetes mellitus with other specified complication: Secondary | ICD-10-CM | POA: Diagnosis not present

## 2022-08-07 DIAGNOSIS — E785 Hyperlipidemia, unspecified: Secondary | ICD-10-CM | POA: Diagnosis not present

## 2022-08-09 DIAGNOSIS — I10 Essential (primary) hypertension: Secondary | ICD-10-CM | POA: Diagnosis not present

## 2022-08-09 DIAGNOSIS — E785 Hyperlipidemia, unspecified: Secondary | ICD-10-CM | POA: Diagnosis not present

## 2022-08-09 DIAGNOSIS — E1169 Type 2 diabetes mellitus with other specified complication: Secondary | ICD-10-CM | POA: Diagnosis not present

## 2022-08-09 DIAGNOSIS — J449 Chronic obstructive pulmonary disease, unspecified: Secondary | ICD-10-CM | POA: Diagnosis not present

## 2022-08-09 DIAGNOSIS — G4700 Insomnia, unspecified: Secondary | ICD-10-CM | POA: Diagnosis not present

## 2022-08-30 DIAGNOSIS — J449 Chronic obstructive pulmonary disease, unspecified: Secondary | ICD-10-CM | POA: Diagnosis not present

## 2022-08-30 DIAGNOSIS — K219 Gastro-esophageal reflux disease without esophagitis: Secondary | ICD-10-CM | POA: Diagnosis not present

## 2022-08-30 DIAGNOSIS — E785 Hyperlipidemia, unspecified: Secondary | ICD-10-CM | POA: Diagnosis not present

## 2022-09-30 DIAGNOSIS — K219 Gastro-esophageal reflux disease without esophagitis: Secondary | ICD-10-CM | POA: Diagnosis not present

## 2022-09-30 DIAGNOSIS — E785 Hyperlipidemia, unspecified: Secondary | ICD-10-CM | POA: Diagnosis not present

## 2022-09-30 DIAGNOSIS — J449 Chronic obstructive pulmonary disease, unspecified: Secondary | ICD-10-CM | POA: Diagnosis not present

## 2022-10-29 DIAGNOSIS — J449 Chronic obstructive pulmonary disease, unspecified: Secondary | ICD-10-CM | POA: Diagnosis not present

## 2022-10-29 DIAGNOSIS — E785 Hyperlipidemia, unspecified: Secondary | ICD-10-CM | POA: Diagnosis not present

## 2022-10-29 DIAGNOSIS — K219 Gastro-esophageal reflux disease without esophagitis: Secondary | ICD-10-CM | POA: Diagnosis not present

## 2022-11-29 DIAGNOSIS — J449 Chronic obstructive pulmonary disease, unspecified: Secondary | ICD-10-CM | POA: Diagnosis not present

## 2022-11-29 DIAGNOSIS — E785 Hyperlipidemia, unspecified: Secondary | ICD-10-CM | POA: Diagnosis not present

## 2022-11-29 DIAGNOSIS — K219 Gastro-esophageal reflux disease without esophagitis: Secondary | ICD-10-CM | POA: Diagnosis not present

## 2022-12-12 DIAGNOSIS — Z1152 Encounter for screening for COVID-19: Secondary | ICD-10-CM | POA: Diagnosis not present

## 2022-12-12 DIAGNOSIS — R0602 Shortness of breath: Secondary | ICD-10-CM | POA: Diagnosis not present

## 2022-12-12 DIAGNOSIS — R9431 Abnormal electrocardiogram [ECG] [EKG]: Secondary | ICD-10-CM | POA: Diagnosis not present

## 2022-12-12 DIAGNOSIS — I10 Essential (primary) hypertension: Secondary | ICD-10-CM | POA: Diagnosis not present

## 2022-12-12 DIAGNOSIS — J9621 Acute and chronic respiratory failure with hypoxia: Secondary | ICD-10-CM | POA: Diagnosis not present

## 2022-12-12 DIAGNOSIS — R059 Cough, unspecified: Secondary | ICD-10-CM | POA: Diagnosis not present

## 2022-12-12 DIAGNOSIS — K219 Gastro-esophageal reflux disease without esophagitis: Secondary | ICD-10-CM | POA: Diagnosis not present

## 2022-12-12 DIAGNOSIS — Z79899 Other long term (current) drug therapy: Secondary | ICD-10-CM | POA: Diagnosis not present

## 2022-12-12 DIAGNOSIS — E119 Type 2 diabetes mellitus without complications: Secondary | ICD-10-CM | POA: Diagnosis not present

## 2022-12-12 DIAGNOSIS — E785 Hyperlipidemia, unspecified: Secondary | ICD-10-CM | POA: Diagnosis not present

## 2022-12-12 DIAGNOSIS — J441 Chronic obstructive pulmonary disease with (acute) exacerbation: Secondary | ICD-10-CM | POA: Diagnosis not present

## 2022-12-12 DIAGNOSIS — R062 Wheezing: Secondary | ICD-10-CM | POA: Diagnosis not present

## 2022-12-12 DIAGNOSIS — G4700 Insomnia, unspecified: Secondary | ICD-10-CM | POA: Diagnosis not present

## 2022-12-13 DIAGNOSIS — J441 Chronic obstructive pulmonary disease with (acute) exacerbation: Secondary | ICD-10-CM | POA: Diagnosis not present

## 2022-12-13 DIAGNOSIS — G4700 Insomnia, unspecified: Secondary | ICD-10-CM | POA: Diagnosis not present

## 2022-12-13 DIAGNOSIS — E119 Type 2 diabetes mellitus without complications: Secondary | ICD-10-CM | POA: Diagnosis not present

## 2022-12-14 DIAGNOSIS — J441 Chronic obstructive pulmonary disease with (acute) exacerbation: Secondary | ICD-10-CM | POA: Diagnosis not present

## 2022-12-14 DIAGNOSIS — G4700 Insomnia, unspecified: Secondary | ICD-10-CM | POA: Diagnosis not present

## 2022-12-14 DIAGNOSIS — E119 Type 2 diabetes mellitus without complications: Secondary | ICD-10-CM | POA: Diagnosis not present

## 2022-12-17 ENCOUNTER — Encounter (HOSPITAL_COMMUNITY): Payer: Self-pay

## 2022-12-17 DIAGNOSIS — R9431 Abnormal electrocardiogram [ECG] [EKG]: Secondary | ICD-10-CM | POA: Diagnosis not present

## 2022-12-17 DIAGNOSIS — I499 Cardiac arrhythmia, unspecified: Secondary | ICD-10-CM | POA: Diagnosis not present

## 2022-12-17 DIAGNOSIS — R000 Tachycardia, unspecified: Secondary | ICD-10-CM | POA: Diagnosis not present

## 2022-12-17 DIAGNOSIS — R6889 Other general symptoms and signs: Secondary | ICD-10-CM | POA: Diagnosis not present

## 2022-12-17 DIAGNOSIS — R062 Wheezing: Secondary | ICD-10-CM | POA: Diagnosis not present

## 2022-12-17 DIAGNOSIS — J9601 Acute respiratory failure with hypoxia: Secondary | ICD-10-CM | POA: Diagnosis not present

## 2022-12-17 DIAGNOSIS — J9811 Atelectasis: Secondary | ICD-10-CM | POA: Diagnosis not present

## 2022-12-17 DIAGNOSIS — E872 Acidosis, unspecified: Secondary | ICD-10-CM | POA: Diagnosis not present

## 2022-12-17 DIAGNOSIS — Z743 Need for continuous supervision: Secondary | ICD-10-CM | POA: Diagnosis not present

## 2022-12-17 DIAGNOSIS — R0902 Hypoxemia: Secondary | ICD-10-CM | POA: Diagnosis not present

## 2022-12-17 DIAGNOSIS — R0602 Shortness of breath: Secondary | ICD-10-CM | POA: Diagnosis not present

## 2022-12-17 DIAGNOSIS — J441 Chronic obstructive pulmonary disease with (acute) exacerbation: Secondary | ICD-10-CM | POA: Diagnosis not present

## 2022-12-17 DIAGNOSIS — J449 Chronic obstructive pulmonary disease, unspecified: Secondary | ICD-10-CM | POA: Diagnosis not present

## 2022-12-18 DIAGNOSIS — Z7984 Long term (current) use of oral hypoglycemic drugs: Secondary | ICD-10-CM | POA: Diagnosis not present

## 2022-12-18 DIAGNOSIS — I129 Hypertensive chronic kidney disease with stage 1 through stage 4 chronic kidney disease, or unspecified chronic kidney disease: Secondary | ICD-10-CM | POA: Diagnosis not present

## 2022-12-18 DIAGNOSIS — J9811 Atelectasis: Secondary | ICD-10-CM | POA: Diagnosis not present

## 2022-12-18 DIAGNOSIS — R0602 Shortness of breath: Secondary | ICD-10-CM | POA: Diagnosis not present

## 2022-12-18 DIAGNOSIS — N1832 Chronic kidney disease, stage 3b: Secondary | ICD-10-CM | POA: Diagnosis not present

## 2022-12-18 DIAGNOSIS — Z9981 Dependence on supplemental oxygen: Secondary | ICD-10-CM | POA: Diagnosis not present

## 2022-12-18 DIAGNOSIS — Z7952 Long term (current) use of systemic steroids: Secondary | ICD-10-CM | POA: Diagnosis not present

## 2022-12-18 DIAGNOSIS — E872 Acidosis, unspecified: Secondary | ICD-10-CM | POA: Diagnosis not present

## 2022-12-18 DIAGNOSIS — Z1152 Encounter for screening for COVID-19: Secondary | ICD-10-CM | POA: Diagnosis not present

## 2022-12-18 DIAGNOSIS — Z79899 Other long term (current) drug therapy: Secondary | ICD-10-CM | POA: Diagnosis not present

## 2022-12-18 DIAGNOSIS — J9622 Acute and chronic respiratory failure with hypercapnia: Secondary | ICD-10-CM | POA: Diagnosis not present

## 2022-12-18 DIAGNOSIS — E1122 Type 2 diabetes mellitus with diabetic chronic kidney disease: Secondary | ICD-10-CM | POA: Diagnosis not present

## 2022-12-18 DIAGNOSIS — J9621 Acute and chronic respiratory failure with hypoxia: Secondary | ICD-10-CM | POA: Diagnosis not present

## 2022-12-18 DIAGNOSIS — J449 Chronic obstructive pulmonary disease, unspecified: Secondary | ICD-10-CM | POA: Diagnosis not present

## 2022-12-18 DIAGNOSIS — R000 Tachycardia, unspecified: Secondary | ICD-10-CM | POA: Diagnosis not present

## 2022-12-18 DIAGNOSIS — R531 Weakness: Secondary | ICD-10-CM | POA: Diagnosis not present

## 2022-12-18 DIAGNOSIS — E7800 Pure hypercholesterolemia, unspecified: Secondary | ICD-10-CM | POA: Diagnosis not present

## 2022-12-18 DIAGNOSIS — G4700 Insomnia, unspecified: Secondary | ICD-10-CM | POA: Diagnosis not present

## 2022-12-18 DIAGNOSIS — R6520 Severe sepsis without septic shock: Secondary | ICD-10-CM | POA: Diagnosis not present

## 2022-12-18 DIAGNOSIS — J9601 Acute respiratory failure with hypoxia: Secondary | ICD-10-CM | POA: Diagnosis not present

## 2022-12-18 DIAGNOSIS — M19049 Primary osteoarthritis, unspecified hand: Secondary | ICD-10-CM | POA: Diagnosis not present

## 2022-12-18 DIAGNOSIS — A419 Sepsis, unspecified organism: Secondary | ICD-10-CM | POA: Diagnosis not present

## 2022-12-18 DIAGNOSIS — R9431 Abnormal electrocardiogram [ECG] [EKG]: Secondary | ICD-10-CM | POA: Diagnosis not present

## 2022-12-18 DIAGNOSIS — J441 Chronic obstructive pulmonary disease with (acute) exacerbation: Secondary | ICD-10-CM | POA: Diagnosis not present

## 2022-12-18 DIAGNOSIS — Z7951 Long term (current) use of inhaled steroids: Secondary | ICD-10-CM | POA: Diagnosis not present

## 2022-12-18 DIAGNOSIS — Z792 Long term (current) use of antibiotics: Secondary | ICD-10-CM | POA: Diagnosis not present

## 2022-12-26 DIAGNOSIS — A4189 Other specified sepsis: Secondary | ICD-10-CM | POA: Diagnosis not present

## 2022-12-26 DIAGNOSIS — Z7984 Long term (current) use of oral hypoglycemic drugs: Secondary | ICD-10-CM | POA: Diagnosis not present

## 2022-12-26 DIAGNOSIS — N1832 Chronic kidney disease, stage 3b: Secondary | ICD-10-CM | POA: Diagnosis not present

## 2022-12-26 DIAGNOSIS — M19049 Primary osteoarthritis, unspecified hand: Secondary | ICD-10-CM | POA: Diagnosis not present

## 2022-12-26 DIAGNOSIS — E1122 Type 2 diabetes mellitus with diabetic chronic kidney disease: Secondary | ICD-10-CM | POA: Diagnosis not present

## 2022-12-26 DIAGNOSIS — E1165 Type 2 diabetes mellitus with hyperglycemia: Secondary | ICD-10-CM | POA: Diagnosis not present

## 2022-12-26 DIAGNOSIS — I2510 Atherosclerotic heart disease of native coronary artery without angina pectoris: Secondary | ICD-10-CM | POA: Diagnosis not present

## 2022-12-26 DIAGNOSIS — J9622 Acute and chronic respiratory failure with hypercapnia: Secondary | ICD-10-CM | POA: Diagnosis not present

## 2022-12-26 DIAGNOSIS — R6520 Severe sepsis without septic shock: Secondary | ICD-10-CM | POA: Diagnosis not present

## 2022-12-26 DIAGNOSIS — K219 Gastro-esophageal reflux disease without esophagitis: Secondary | ICD-10-CM | POA: Diagnosis not present

## 2022-12-26 DIAGNOSIS — G4700 Insomnia, unspecified: Secondary | ICD-10-CM | POA: Diagnosis not present

## 2022-12-26 DIAGNOSIS — E1169 Type 2 diabetes mellitus with other specified complication: Secondary | ICD-10-CM | POA: Diagnosis not present

## 2022-12-26 DIAGNOSIS — E785 Hyperlipidemia, unspecified: Secondary | ICD-10-CM | POA: Diagnosis not present

## 2022-12-26 DIAGNOSIS — I129 Hypertensive chronic kidney disease with stage 1 through stage 4 chronic kidney disease, or unspecified chronic kidney disease: Secondary | ICD-10-CM | POA: Diagnosis not present

## 2022-12-26 DIAGNOSIS — J9621 Acute and chronic respiratory failure with hypoxia: Secondary | ICD-10-CM | POA: Diagnosis not present

## 2022-12-26 DIAGNOSIS — Z9981 Dependence on supplemental oxygen: Secondary | ICD-10-CM | POA: Diagnosis not present

## 2022-12-26 DIAGNOSIS — B348 Other viral infections of unspecified site: Secondary | ICD-10-CM | POA: Diagnosis not present

## 2022-12-26 DIAGNOSIS — J441 Chronic obstructive pulmonary disease with (acute) exacerbation: Secondary | ICD-10-CM | POA: Diagnosis not present

## 2022-12-26 DIAGNOSIS — Z7951 Long term (current) use of inhaled steroids: Secondary | ICD-10-CM | POA: Diagnosis not present

## 2022-12-26 DIAGNOSIS — Z792 Long term (current) use of antibiotics: Secondary | ICD-10-CM | POA: Diagnosis not present

## 2022-12-26 DIAGNOSIS — I700 Atherosclerosis of aorta: Secondary | ICD-10-CM | POA: Diagnosis not present

## 2022-12-28 DIAGNOSIS — Z7984 Long term (current) use of oral hypoglycemic drugs: Secondary | ICD-10-CM | POA: Diagnosis not present

## 2022-12-28 DIAGNOSIS — Z792 Long term (current) use of antibiotics: Secondary | ICD-10-CM | POA: Diagnosis not present

## 2022-12-28 DIAGNOSIS — J9622 Acute and chronic respiratory failure with hypercapnia: Secondary | ICD-10-CM | POA: Diagnosis not present

## 2022-12-28 DIAGNOSIS — N1832 Chronic kidney disease, stage 3b: Secondary | ICD-10-CM | POA: Diagnosis not present

## 2022-12-28 DIAGNOSIS — E785 Hyperlipidemia, unspecified: Secondary | ICD-10-CM | POA: Diagnosis not present

## 2022-12-28 DIAGNOSIS — G4700 Insomnia, unspecified: Secondary | ICD-10-CM | POA: Diagnosis not present

## 2022-12-28 DIAGNOSIS — I700 Atherosclerosis of aorta: Secondary | ICD-10-CM | POA: Diagnosis not present

## 2022-12-28 DIAGNOSIS — K219 Gastro-esophageal reflux disease without esophagitis: Secondary | ICD-10-CM | POA: Diagnosis not present

## 2022-12-28 DIAGNOSIS — B348 Other viral infections of unspecified site: Secondary | ICD-10-CM | POA: Diagnosis not present

## 2022-12-28 DIAGNOSIS — Z7951 Long term (current) use of inhaled steroids: Secondary | ICD-10-CM | POA: Diagnosis not present

## 2022-12-28 DIAGNOSIS — J9621 Acute and chronic respiratory failure with hypoxia: Secondary | ICD-10-CM | POA: Diagnosis not present

## 2022-12-28 DIAGNOSIS — R6520 Severe sepsis without septic shock: Secondary | ICD-10-CM | POA: Diagnosis not present

## 2022-12-28 DIAGNOSIS — E1165 Type 2 diabetes mellitus with hyperglycemia: Secondary | ICD-10-CM | POA: Diagnosis not present

## 2022-12-28 DIAGNOSIS — E1122 Type 2 diabetes mellitus with diabetic chronic kidney disease: Secondary | ICD-10-CM | POA: Diagnosis not present

## 2022-12-28 DIAGNOSIS — E1169 Type 2 diabetes mellitus with other specified complication: Secondary | ICD-10-CM | POA: Diagnosis not present

## 2022-12-28 DIAGNOSIS — Z9981 Dependence on supplemental oxygen: Secondary | ICD-10-CM | POA: Diagnosis not present

## 2022-12-28 DIAGNOSIS — I129 Hypertensive chronic kidney disease with stage 1 through stage 4 chronic kidney disease, or unspecified chronic kidney disease: Secondary | ICD-10-CM | POA: Diagnosis not present

## 2022-12-28 DIAGNOSIS — M19049 Primary osteoarthritis, unspecified hand: Secondary | ICD-10-CM | POA: Diagnosis not present

## 2022-12-28 DIAGNOSIS — I2510 Atherosclerotic heart disease of native coronary artery without angina pectoris: Secondary | ICD-10-CM | POA: Diagnosis not present

## 2022-12-28 DIAGNOSIS — A4189 Other specified sepsis: Secondary | ICD-10-CM | POA: Diagnosis not present

## 2022-12-28 DIAGNOSIS — J441 Chronic obstructive pulmonary disease with (acute) exacerbation: Secondary | ICD-10-CM | POA: Diagnosis not present

## 2022-12-29 DIAGNOSIS — K219 Gastro-esophageal reflux disease without esophagitis: Secondary | ICD-10-CM | POA: Diagnosis not present

## 2022-12-29 DIAGNOSIS — E785 Hyperlipidemia, unspecified: Secondary | ICD-10-CM | POA: Diagnosis not present

## 2022-12-29 DIAGNOSIS — J449 Chronic obstructive pulmonary disease, unspecified: Secondary | ICD-10-CM | POA: Diagnosis not present

## 2023-01-03 DIAGNOSIS — E1169 Type 2 diabetes mellitus with other specified complication: Secondary | ICD-10-CM | POA: Diagnosis not present

## 2023-01-03 DIAGNOSIS — E785 Hyperlipidemia, unspecified: Secondary | ICD-10-CM | POA: Diagnosis not present

## 2023-01-03 DIAGNOSIS — J9611 Chronic respiratory failure with hypoxia: Secondary | ICD-10-CM | POA: Diagnosis not present

## 2023-01-03 DIAGNOSIS — Z9981 Dependence on supplemental oxygen: Secondary | ICD-10-CM | POA: Diagnosis not present

## 2023-01-03 DIAGNOSIS — J449 Chronic obstructive pulmonary disease, unspecified: Secondary | ICD-10-CM | POA: Diagnosis not present

## 2023-01-04 DIAGNOSIS — N1832 Chronic kidney disease, stage 3b: Secondary | ICD-10-CM | POA: Diagnosis not present

## 2023-01-04 DIAGNOSIS — J9622 Acute and chronic respiratory failure with hypercapnia: Secondary | ICD-10-CM | POA: Diagnosis not present

## 2023-01-04 DIAGNOSIS — J9621 Acute and chronic respiratory failure with hypoxia: Secondary | ICD-10-CM | POA: Diagnosis not present

## 2023-01-04 DIAGNOSIS — E1169 Type 2 diabetes mellitus with other specified complication: Secondary | ICD-10-CM | POA: Diagnosis not present

## 2023-01-04 DIAGNOSIS — E1122 Type 2 diabetes mellitus with diabetic chronic kidney disease: Secondary | ICD-10-CM | POA: Diagnosis not present

## 2023-01-04 DIAGNOSIS — A4189 Other specified sepsis: Secondary | ICD-10-CM | POA: Diagnosis not present

## 2023-01-04 DIAGNOSIS — R6520 Severe sepsis without septic shock: Secondary | ICD-10-CM | POA: Diagnosis not present

## 2023-01-04 DIAGNOSIS — Z7951 Long term (current) use of inhaled steroids: Secondary | ICD-10-CM | POA: Diagnosis not present

## 2023-01-04 DIAGNOSIS — Z7984 Long term (current) use of oral hypoglycemic drugs: Secondary | ICD-10-CM | POA: Diagnosis not present

## 2023-01-04 DIAGNOSIS — K219 Gastro-esophageal reflux disease without esophagitis: Secondary | ICD-10-CM | POA: Diagnosis not present

## 2023-01-04 DIAGNOSIS — I129 Hypertensive chronic kidney disease with stage 1 through stage 4 chronic kidney disease, or unspecified chronic kidney disease: Secondary | ICD-10-CM | POA: Diagnosis not present

## 2023-01-04 DIAGNOSIS — Z792 Long term (current) use of antibiotics: Secondary | ICD-10-CM | POA: Diagnosis not present

## 2023-01-04 DIAGNOSIS — I2510 Atherosclerotic heart disease of native coronary artery without angina pectoris: Secondary | ICD-10-CM | POA: Diagnosis not present

## 2023-01-04 DIAGNOSIS — J441 Chronic obstructive pulmonary disease with (acute) exacerbation: Secondary | ICD-10-CM | POA: Diagnosis not present

## 2023-01-04 DIAGNOSIS — E785 Hyperlipidemia, unspecified: Secondary | ICD-10-CM | POA: Diagnosis not present

## 2023-01-04 DIAGNOSIS — E1165 Type 2 diabetes mellitus with hyperglycemia: Secondary | ICD-10-CM | POA: Diagnosis not present

## 2023-01-04 DIAGNOSIS — I700 Atherosclerosis of aorta: Secondary | ICD-10-CM | POA: Diagnosis not present

## 2023-01-04 DIAGNOSIS — M19049 Primary osteoarthritis, unspecified hand: Secondary | ICD-10-CM | POA: Diagnosis not present

## 2023-01-04 DIAGNOSIS — B348 Other viral infections of unspecified site: Secondary | ICD-10-CM | POA: Diagnosis not present

## 2023-01-04 DIAGNOSIS — Z9981 Dependence on supplemental oxygen: Secondary | ICD-10-CM | POA: Diagnosis not present

## 2023-01-04 DIAGNOSIS — G4700 Insomnia, unspecified: Secondary | ICD-10-CM | POA: Diagnosis not present

## 2023-01-09 DIAGNOSIS — J9621 Acute and chronic respiratory failure with hypoxia: Secondary | ICD-10-CM | POA: Diagnosis not present

## 2023-01-09 DIAGNOSIS — E1169 Type 2 diabetes mellitus with other specified complication: Secondary | ICD-10-CM | POA: Diagnosis not present

## 2023-01-09 DIAGNOSIS — E1122 Type 2 diabetes mellitus with diabetic chronic kidney disease: Secondary | ICD-10-CM | POA: Diagnosis not present

## 2023-01-09 DIAGNOSIS — Z7984 Long term (current) use of oral hypoglycemic drugs: Secondary | ICD-10-CM | POA: Diagnosis not present

## 2023-01-09 DIAGNOSIS — I700 Atherosclerosis of aorta: Secondary | ICD-10-CM | POA: Diagnosis not present

## 2023-01-09 DIAGNOSIS — Z7951 Long term (current) use of inhaled steroids: Secondary | ICD-10-CM | POA: Diagnosis not present

## 2023-01-09 DIAGNOSIS — J441 Chronic obstructive pulmonary disease with (acute) exacerbation: Secondary | ICD-10-CM | POA: Diagnosis not present

## 2023-01-09 DIAGNOSIS — E785 Hyperlipidemia, unspecified: Secondary | ICD-10-CM | POA: Diagnosis not present

## 2023-01-09 DIAGNOSIS — I129 Hypertensive chronic kidney disease with stage 1 through stage 4 chronic kidney disease, or unspecified chronic kidney disease: Secondary | ICD-10-CM | POA: Diagnosis not present

## 2023-01-09 DIAGNOSIS — A4189 Other specified sepsis: Secondary | ICD-10-CM | POA: Diagnosis not present

## 2023-01-09 DIAGNOSIS — G4700 Insomnia, unspecified: Secondary | ICD-10-CM | POA: Diagnosis not present

## 2023-01-09 DIAGNOSIS — K219 Gastro-esophageal reflux disease without esophagitis: Secondary | ICD-10-CM | POA: Diagnosis not present

## 2023-01-09 DIAGNOSIS — R6520 Severe sepsis without septic shock: Secondary | ICD-10-CM | POA: Diagnosis not present

## 2023-01-09 DIAGNOSIS — J9622 Acute and chronic respiratory failure with hypercapnia: Secondary | ICD-10-CM | POA: Diagnosis not present

## 2023-01-09 DIAGNOSIS — E1165 Type 2 diabetes mellitus with hyperglycemia: Secondary | ICD-10-CM | POA: Diagnosis not present

## 2023-01-09 DIAGNOSIS — M19049 Primary osteoarthritis, unspecified hand: Secondary | ICD-10-CM | POA: Diagnosis not present

## 2023-01-09 DIAGNOSIS — I2510 Atherosclerotic heart disease of native coronary artery without angina pectoris: Secondary | ICD-10-CM | POA: Diagnosis not present

## 2023-01-09 DIAGNOSIS — Z792 Long term (current) use of antibiotics: Secondary | ICD-10-CM | POA: Diagnosis not present

## 2023-01-09 DIAGNOSIS — B348 Other viral infections of unspecified site: Secondary | ICD-10-CM | POA: Diagnosis not present

## 2023-01-09 DIAGNOSIS — N1832 Chronic kidney disease, stage 3b: Secondary | ICD-10-CM | POA: Diagnosis not present

## 2023-01-09 DIAGNOSIS — Z9981 Dependence on supplemental oxygen: Secondary | ICD-10-CM | POA: Diagnosis not present

## 2023-01-16 DIAGNOSIS — M19049 Primary osteoarthritis, unspecified hand: Secondary | ICD-10-CM | POA: Diagnosis not present

## 2023-01-16 DIAGNOSIS — Z792 Long term (current) use of antibiotics: Secondary | ICD-10-CM | POA: Diagnosis not present

## 2023-01-16 DIAGNOSIS — I700 Atherosclerosis of aorta: Secondary | ICD-10-CM | POA: Diagnosis not present

## 2023-01-16 DIAGNOSIS — E1122 Type 2 diabetes mellitus with diabetic chronic kidney disease: Secondary | ICD-10-CM | POA: Diagnosis not present

## 2023-01-16 DIAGNOSIS — I129 Hypertensive chronic kidney disease with stage 1 through stage 4 chronic kidney disease, or unspecified chronic kidney disease: Secondary | ICD-10-CM | POA: Diagnosis not present

## 2023-01-16 DIAGNOSIS — B348 Other viral infections of unspecified site: Secondary | ICD-10-CM | POA: Diagnosis not present

## 2023-01-16 DIAGNOSIS — E1169 Type 2 diabetes mellitus with other specified complication: Secondary | ICD-10-CM | POA: Diagnosis not present

## 2023-01-16 DIAGNOSIS — J441 Chronic obstructive pulmonary disease with (acute) exacerbation: Secondary | ICD-10-CM | POA: Diagnosis not present

## 2023-01-16 DIAGNOSIS — Z9981 Dependence on supplemental oxygen: Secondary | ICD-10-CM | POA: Diagnosis not present

## 2023-01-16 DIAGNOSIS — J9621 Acute and chronic respiratory failure with hypoxia: Secondary | ICD-10-CM | POA: Diagnosis not present

## 2023-01-16 DIAGNOSIS — Z7951 Long term (current) use of inhaled steroids: Secondary | ICD-10-CM | POA: Diagnosis not present

## 2023-01-16 DIAGNOSIS — A4189 Other specified sepsis: Secondary | ICD-10-CM | POA: Diagnosis not present

## 2023-01-16 DIAGNOSIS — G4700 Insomnia, unspecified: Secondary | ICD-10-CM | POA: Diagnosis not present

## 2023-01-16 DIAGNOSIS — K219 Gastro-esophageal reflux disease without esophagitis: Secondary | ICD-10-CM | POA: Diagnosis not present

## 2023-01-16 DIAGNOSIS — E785 Hyperlipidemia, unspecified: Secondary | ICD-10-CM | POA: Diagnosis not present

## 2023-01-16 DIAGNOSIS — J9622 Acute and chronic respiratory failure with hypercapnia: Secondary | ICD-10-CM | POA: Diagnosis not present

## 2023-01-16 DIAGNOSIS — R6520 Severe sepsis without septic shock: Secondary | ICD-10-CM | POA: Diagnosis not present

## 2023-01-16 DIAGNOSIS — Z7984 Long term (current) use of oral hypoglycemic drugs: Secondary | ICD-10-CM | POA: Diagnosis not present

## 2023-01-16 DIAGNOSIS — I2510 Atherosclerotic heart disease of native coronary artery without angina pectoris: Secondary | ICD-10-CM | POA: Diagnosis not present

## 2023-01-16 DIAGNOSIS — E1165 Type 2 diabetes mellitus with hyperglycemia: Secondary | ICD-10-CM | POA: Diagnosis not present

## 2023-01-16 DIAGNOSIS — N1832 Chronic kidney disease, stage 3b: Secondary | ICD-10-CM | POA: Diagnosis not present

## 2023-01-24 DIAGNOSIS — G4700 Insomnia, unspecified: Secondary | ICD-10-CM | POA: Diagnosis not present

## 2023-01-24 DIAGNOSIS — E1122 Type 2 diabetes mellitus with diabetic chronic kidney disease: Secondary | ICD-10-CM | POA: Diagnosis not present

## 2023-01-24 DIAGNOSIS — I129 Hypertensive chronic kidney disease with stage 1 through stage 4 chronic kidney disease, or unspecified chronic kidney disease: Secondary | ICD-10-CM | POA: Diagnosis not present

## 2023-01-24 DIAGNOSIS — N1832 Chronic kidney disease, stage 3b: Secondary | ICD-10-CM | POA: Diagnosis not present

## 2023-01-24 DIAGNOSIS — I700 Atherosclerosis of aorta: Secondary | ICD-10-CM | POA: Diagnosis not present

## 2023-01-24 DIAGNOSIS — A4189 Other specified sepsis: Secondary | ICD-10-CM | POA: Diagnosis not present

## 2023-01-24 DIAGNOSIS — R6520 Severe sepsis without septic shock: Secondary | ICD-10-CM | POA: Diagnosis not present

## 2023-01-24 DIAGNOSIS — Z792 Long term (current) use of antibiotics: Secondary | ICD-10-CM | POA: Diagnosis not present

## 2023-01-24 DIAGNOSIS — M19049 Primary osteoarthritis, unspecified hand: Secondary | ICD-10-CM | POA: Diagnosis not present

## 2023-01-24 DIAGNOSIS — J441 Chronic obstructive pulmonary disease with (acute) exacerbation: Secondary | ICD-10-CM | POA: Diagnosis not present

## 2023-01-24 DIAGNOSIS — J9621 Acute and chronic respiratory failure with hypoxia: Secondary | ICD-10-CM | POA: Diagnosis not present

## 2023-01-24 DIAGNOSIS — E1169 Type 2 diabetes mellitus with other specified complication: Secondary | ICD-10-CM | POA: Diagnosis not present

## 2023-01-24 DIAGNOSIS — Z7951 Long term (current) use of inhaled steroids: Secondary | ICD-10-CM | POA: Diagnosis not present

## 2023-01-24 DIAGNOSIS — Z7984 Long term (current) use of oral hypoglycemic drugs: Secondary | ICD-10-CM | POA: Diagnosis not present

## 2023-01-24 DIAGNOSIS — E1165 Type 2 diabetes mellitus with hyperglycemia: Secondary | ICD-10-CM | POA: Diagnosis not present

## 2023-01-24 DIAGNOSIS — B348 Other viral infections of unspecified site: Secondary | ICD-10-CM | POA: Diagnosis not present

## 2023-01-24 DIAGNOSIS — Z9981 Dependence on supplemental oxygen: Secondary | ICD-10-CM | POA: Diagnosis not present

## 2023-01-24 DIAGNOSIS — I2510 Atherosclerotic heart disease of native coronary artery without angina pectoris: Secondary | ICD-10-CM | POA: Diagnosis not present

## 2023-01-24 DIAGNOSIS — K219 Gastro-esophageal reflux disease without esophagitis: Secondary | ICD-10-CM | POA: Diagnosis not present

## 2023-01-24 DIAGNOSIS — E785 Hyperlipidemia, unspecified: Secondary | ICD-10-CM | POA: Diagnosis not present

## 2023-01-24 DIAGNOSIS — J9622 Acute and chronic respiratory failure with hypercapnia: Secondary | ICD-10-CM | POA: Diagnosis not present

## 2023-01-29 DIAGNOSIS — E785 Hyperlipidemia, unspecified: Secondary | ICD-10-CM | POA: Diagnosis not present

## 2023-01-29 DIAGNOSIS — J449 Chronic obstructive pulmonary disease, unspecified: Secondary | ICD-10-CM | POA: Diagnosis not present

## 2023-01-29 DIAGNOSIS — K219 Gastro-esophageal reflux disease without esophagitis: Secondary | ICD-10-CM | POA: Diagnosis not present

## 2023-02-12 DIAGNOSIS — E1169 Type 2 diabetes mellitus with other specified complication: Secondary | ICD-10-CM | POA: Diagnosis not present

## 2023-02-12 DIAGNOSIS — E785 Hyperlipidemia, unspecified: Secondary | ICD-10-CM | POA: Diagnosis not present

## 2023-02-14 DIAGNOSIS — J449 Chronic obstructive pulmonary disease, unspecified: Secondary | ICD-10-CM | POA: Diagnosis not present

## 2023-02-14 DIAGNOSIS — I10 Essential (primary) hypertension: Secondary | ICD-10-CM | POA: Diagnosis not present

## 2023-02-14 DIAGNOSIS — E1169 Type 2 diabetes mellitus with other specified complication: Secondary | ICD-10-CM | POA: Diagnosis not present

## 2023-02-14 DIAGNOSIS — E785 Hyperlipidemia, unspecified: Secondary | ICD-10-CM | POA: Diagnosis not present

## 2023-02-14 DIAGNOSIS — Z139 Encounter for screening, unspecified: Secondary | ICD-10-CM | POA: Diagnosis not present

## 2023-02-14 DIAGNOSIS — G4700 Insomnia, unspecified: Secondary | ICD-10-CM | POA: Diagnosis not present

## 2023-02-28 DIAGNOSIS — J449 Chronic obstructive pulmonary disease, unspecified: Secondary | ICD-10-CM | POA: Diagnosis not present

## 2023-02-28 DIAGNOSIS — K219 Gastro-esophageal reflux disease without esophagitis: Secondary | ICD-10-CM | POA: Diagnosis not present

## 2023-02-28 DIAGNOSIS — E785 Hyperlipidemia, unspecified: Secondary | ICD-10-CM | POA: Diagnosis not present

## 2023-03-31 DIAGNOSIS — K219 Gastro-esophageal reflux disease without esophagitis: Secondary | ICD-10-CM | POA: Diagnosis not present

## 2023-03-31 DIAGNOSIS — E785 Hyperlipidemia, unspecified: Secondary | ICD-10-CM | POA: Diagnosis not present

## 2023-03-31 DIAGNOSIS — J449 Chronic obstructive pulmonary disease, unspecified: Secondary | ICD-10-CM | POA: Diagnosis not present

## 2023-08-31 DIAGNOSIS — J449 Chronic obstructive pulmonary disease, unspecified: Secondary | ICD-10-CM | POA: Diagnosis not present

## 2023-09-07 DIAGNOSIS — E785 Hyperlipidemia, unspecified: Secondary | ICD-10-CM | POA: Diagnosis not present

## 2023-09-07 DIAGNOSIS — E1169 Type 2 diabetes mellitus with other specified complication: Secondary | ICD-10-CM | POA: Diagnosis not present

## 2023-09-27 DIAGNOSIS — E785 Hyperlipidemia, unspecified: Secondary | ICD-10-CM | POA: Diagnosis not present

## 2023-09-27 DIAGNOSIS — E1121 Type 2 diabetes mellitus with diabetic nephropathy: Secondary | ICD-10-CM | POA: Diagnosis not present

## 2023-09-27 DIAGNOSIS — N1832 Chronic kidney disease, stage 3b: Secondary | ICD-10-CM | POA: Diagnosis not present

## 2023-09-27 DIAGNOSIS — J9611 Chronic respiratory failure with hypoxia: Secondary | ICD-10-CM | POA: Diagnosis not present

## 2023-09-27 DIAGNOSIS — J449 Chronic obstructive pulmonary disease, unspecified: Secondary | ICD-10-CM | POA: Diagnosis not present

## 2023-09-27 DIAGNOSIS — G4700 Insomnia, unspecified: Secondary | ICD-10-CM | POA: Diagnosis not present

## 2023-09-27 DIAGNOSIS — Z9981 Dependence on supplemental oxygen: Secondary | ICD-10-CM | POA: Diagnosis not present

## 2023-09-27 DIAGNOSIS — Z23 Encounter for immunization: Secondary | ICD-10-CM | POA: Diagnosis not present

## 2023-09-27 DIAGNOSIS — E875 Hyperkalemia: Secondary | ICD-10-CM | POA: Diagnosis not present

## 2023-09-27 DIAGNOSIS — I10 Essential (primary) hypertension: Secondary | ICD-10-CM | POA: Diagnosis not present

## 2023-10-01 DIAGNOSIS — J449 Chronic obstructive pulmonary disease, unspecified: Secondary | ICD-10-CM | POA: Diagnosis not present

## 2023-10-29 DIAGNOSIS — J449 Chronic obstructive pulmonary disease, unspecified: Secondary | ICD-10-CM | POA: Diagnosis not present

## 2023-11-29 DIAGNOSIS — J449 Chronic obstructive pulmonary disease, unspecified: Secondary | ICD-10-CM | POA: Diagnosis not present

## 2023-12-27 DIAGNOSIS — Z9181 History of falling: Secondary | ICD-10-CM | POA: Diagnosis not present

## 2023-12-27 DIAGNOSIS — Z23 Encounter for immunization: Secondary | ICD-10-CM | POA: Diagnosis not present

## 2023-12-27 DIAGNOSIS — E785 Hyperlipidemia, unspecified: Secondary | ICD-10-CM | POA: Diagnosis not present

## 2023-12-27 DIAGNOSIS — G4700 Insomnia, unspecified: Secondary | ICD-10-CM | POA: Diagnosis not present

## 2023-12-27 DIAGNOSIS — I10 Essential (primary) hypertension: Secondary | ICD-10-CM | POA: Diagnosis not present

## 2023-12-27 DIAGNOSIS — E1121 Type 2 diabetes mellitus with diabetic nephropathy: Secondary | ICD-10-CM | POA: Diagnosis not present

## 2023-12-27 DIAGNOSIS — N1831 Chronic kidney disease, stage 3a: Secondary | ICD-10-CM | POA: Diagnosis not present

## 2023-12-27 DIAGNOSIS — J449 Chronic obstructive pulmonary disease, unspecified: Secondary | ICD-10-CM | POA: Diagnosis not present

## 2023-12-27 DIAGNOSIS — Z1231 Encounter for screening mammogram for malignant neoplasm of breast: Secondary | ICD-10-CM | POA: Diagnosis not present

## 2023-12-29 DIAGNOSIS — J449 Chronic obstructive pulmonary disease, unspecified: Secondary | ICD-10-CM | POA: Diagnosis not present

## 2024-01-01 DIAGNOSIS — E1121 Type 2 diabetes mellitus with diabetic nephropathy: Secondary | ICD-10-CM | POA: Diagnosis not present

## 2024-01-01 DIAGNOSIS — E785 Hyperlipidemia, unspecified: Secondary | ICD-10-CM | POA: Diagnosis not present

## 2024-01-01 DIAGNOSIS — N1831 Chronic kidney disease, stage 3a: Secondary | ICD-10-CM | POA: Diagnosis not present

## 2024-01-03 DIAGNOSIS — E1121 Type 2 diabetes mellitus with diabetic nephropathy: Secondary | ICD-10-CM | POA: Diagnosis not present

## 2024-01-29 DIAGNOSIS — J449 Chronic obstructive pulmonary disease, unspecified: Secondary | ICD-10-CM | POA: Diagnosis not present

## 2024-02-28 DIAGNOSIS — J449 Chronic obstructive pulmonary disease, unspecified: Secondary | ICD-10-CM | POA: Diagnosis not present

## 2024-03-30 DIAGNOSIS — J449 Chronic obstructive pulmonary disease, unspecified: Secondary | ICD-10-CM | POA: Diagnosis not present

## 2024-04-30 DIAGNOSIS — J449 Chronic obstructive pulmonary disease, unspecified: Secondary | ICD-10-CM | POA: Diagnosis not present

## 2024-05-30 DIAGNOSIS — J449 Chronic obstructive pulmonary disease, unspecified: Secondary | ICD-10-CM | POA: Diagnosis not present
